# Patient Record
Sex: Male | Born: 1945 | Smoking: Current every day smoker
Health system: Southern US, Academic
[De-identification: ages and names within clinical notes are randomized; demographics above are authoritative.]

## PROBLEM LIST (undated history)

## (undated) DIAGNOSIS — I2510 Atherosclerotic heart disease of native coronary artery without angina pectoris: Secondary | ICD-10-CM

## (undated) DIAGNOSIS — M62838 Other muscle spasm: Secondary | ICD-10-CM

## (undated) DIAGNOSIS — F419 Anxiety disorder, unspecified: Secondary | ICD-10-CM

## (undated) DIAGNOSIS — I714 Abdominal aortic aneurysm, without rupture, unspecified: Secondary | ICD-10-CM

## (undated) DIAGNOSIS — F32A Depression, unspecified: Secondary | ICD-10-CM

## (undated) DIAGNOSIS — E785 Hyperlipidemia, unspecified: Secondary | ICD-10-CM

## (undated) DIAGNOSIS — I10 Essential (primary) hypertension: Secondary | ICD-10-CM

## (undated) DIAGNOSIS — J449 Chronic obstructive pulmonary disease, unspecified: Secondary | ICD-10-CM

## (undated) DIAGNOSIS — F329 Major depressive disorder, single episode, unspecified: Secondary | ICD-10-CM

## (undated) DIAGNOSIS — I4891 Unspecified atrial fibrillation: Secondary | ICD-10-CM

## (undated) DIAGNOSIS — I519 Heart disease, unspecified: Secondary | ICD-10-CM

## (undated) DIAGNOSIS — J9600 Acute respiratory failure, unspecified whether with hypoxia or hypercapnia: Secondary | ICD-10-CM

## (undated) DIAGNOSIS — I255 Ischemic cardiomyopathy: Secondary | ICD-10-CM

## (undated) DIAGNOSIS — I219 Acute myocardial infarction, unspecified: Secondary | ICD-10-CM

## (undated) DIAGNOSIS — I7773 Dissection of renal artery: Secondary | ICD-10-CM

## (undated) DIAGNOSIS — R51 Headache: Secondary | ICD-10-CM

## (undated) DIAGNOSIS — R519 Headache, unspecified: Secondary | ICD-10-CM

## (undated) DIAGNOSIS — F17200 Nicotine dependence, unspecified, uncomplicated: Secondary | ICD-10-CM

## (undated) DIAGNOSIS — IMO0001 Reserved for inherently not codable concepts without codable children: Secondary | ICD-10-CM

## (undated) DIAGNOSIS — I639 Cerebral infarction, unspecified: Secondary | ICD-10-CM

## (undated) DIAGNOSIS — I509 Heart failure, unspecified: Secondary | ICD-10-CM

## (undated) DIAGNOSIS — I739 Peripheral vascular disease, unspecified: Secondary | ICD-10-CM

## (undated) HISTORY — PX: HX CERVICAL SPINE SURGERY: 2100001197

## (undated) HISTORY — PX: HX HERNIA REPAIR: SHX51

## (undated) HISTORY — PX: OTHER SURGICAL HISTORY: SHX169

## (undated) HISTORY — PX: NECK SURGERY: SHX720

## (undated) HISTORY — DX: Peripheral vascular disease, unspecified: I73.9

## (undated) HISTORY — DX: Heart disease, unspecified: I51.9

## (undated) HISTORY — DX: Atherosclerotic heart disease of native coronary artery without angina pectoris: I25.10

## (undated) HISTORY — DX: Cerebral infarction, unspecified: I63.9

## (undated) HISTORY — PX: HAND SURGERY: SHX662

## (undated) HISTORY — DX: Acute myocardial infarction, unspecified: I21.9

## (undated) HISTORY — PX: BACK SURGERY: SHX140

## (undated) HISTORY — DX: Nicotine dependence, unspecified, uncomplicated: F17.200

## (undated) HISTORY — DX: Abdominal aortic aneurysm, without rupture, unspecified: I71.40

## (undated) HISTORY — PX: HERNIA REPAIR: SHX51

## (undated) HISTORY — DX: Unspecified atrial fibrillation: I48.91

## (undated) HISTORY — DX: Chronic obstructive pulmonary disease, unspecified: J44.9

## (undated) HISTORY — DX: Hyperlipidemia, unspecified: E78.5

## (undated) HISTORY — DX: Abdominal aortic aneurysm, without rupture: I71.4

## (undated) HISTORY — PX: WRIST SURGERY: SHX841

---
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
## 2000-11-04 ENCOUNTER — Inpatient Hospital Stay (HOSPITAL_COMMUNITY): Admission: EM | Admit: 2000-11-04 | Discharge: 2000-11-07 | Payer: Self-pay | Admitting: Emergency Medicine

## 2000-11-23 ENCOUNTER — Ambulatory Visit (HOSPITAL_COMMUNITY): Admission: RE | Admit: 2000-11-23 | Discharge: 2000-11-23 | Payer: Self-pay | Admitting: Cardiology

## 2000-11-26 ENCOUNTER — Inpatient Hospital Stay (HOSPITAL_COMMUNITY): Admission: EM | Admit: 2000-11-26 | Discharge: 2000-12-01 | Payer: Self-pay | Admitting: Emergency Medicine

## 2000-11-26 ENCOUNTER — Encounter: Payer: Self-pay | Admitting: Internal Medicine

## 2002-06-14 ENCOUNTER — Encounter: Payer: Self-pay | Admitting: Neurosurgery

## 2002-06-14 ENCOUNTER — Encounter: Admission: RE | Admit: 2002-06-14 | Discharge: 2002-06-14 | Payer: Self-pay | Admitting: Neurosurgery

## 2002-09-24 ENCOUNTER — Emergency Department (HOSPITAL_COMMUNITY): Admission: EM | Admit: 2002-09-24 | Discharge: 2002-09-24 | Payer: Self-pay | Admitting: Emergency Medicine

## 2002-09-24 ENCOUNTER — Encounter: Payer: Self-pay | Admitting: Emergency Medicine

## 2002-11-08 ENCOUNTER — Encounter: Payer: Self-pay | Admitting: Neurosurgery

## 2002-11-08 ENCOUNTER — Ambulatory Visit (HOSPITAL_COMMUNITY): Admission: RE | Admit: 2002-11-08 | Discharge: 2002-11-08 | Payer: Self-pay | Admitting: Neurosurgery

## 2003-01-02 ENCOUNTER — Encounter: Payer: Self-pay | Admitting: Neurosurgery

## 2003-01-05 ENCOUNTER — Ambulatory Visit (HOSPITAL_COMMUNITY): Admission: RE | Admit: 2003-01-05 | Discharge: 2003-01-06 | Payer: Self-pay | Admitting: Neurosurgery

## 2003-01-05 ENCOUNTER — Encounter: Payer: Self-pay | Admitting: Neurosurgery

## 2004-05-24 ENCOUNTER — Emergency Department (HOSPITAL_COMMUNITY): Admission: EM | Admit: 2004-05-24 | Discharge: 2004-05-24 | Payer: Self-pay | Admitting: Emergency Medicine

## 2004-11-21 ENCOUNTER — Ambulatory Visit: Payer: Self-pay | Admitting: Cardiology

## 2006-11-11 ENCOUNTER — Encounter: Admission: RE | Admit: 2006-11-11 | Discharge: 2006-11-11 | Payer: Self-pay | Admitting: Neurosurgery

## 2008-09-01 ENCOUNTER — Ambulatory Visit: Payer: Self-pay | Admitting: Cardiology

## 2008-09-01 ENCOUNTER — Inpatient Hospital Stay (HOSPITAL_COMMUNITY): Admission: EM | Admit: 2008-09-01 | Discharge: 2008-09-06 | Payer: Self-pay | Admitting: Emergency Medicine

## 2008-09-18 DIAGNOSIS — I2510 Atherosclerotic heart disease of native coronary artery without angina pectoris: Secondary | ICD-10-CM | POA: Insufficient documentation

## 2008-09-18 DIAGNOSIS — E7800 Pure hypercholesterolemia, unspecified: Secondary | ICD-10-CM | POA: Insufficient documentation

## 2008-09-18 DIAGNOSIS — I10 Essential (primary) hypertension: Secondary | ICD-10-CM | POA: Insufficient documentation

## 2008-10-28 ENCOUNTER — Emergency Department (HOSPITAL_BASED_OUTPATIENT_CLINIC_OR_DEPARTMENT_OTHER): Admission: EM | Admit: 2008-10-28 | Discharge: 2008-10-28 | Payer: Self-pay | Admitting: Emergency Medicine

## 2008-12-26 HISTORY — PX: US ECHOCARDIOGRAPHY: HXRAD669

## 2009-10-30 ENCOUNTER — Ambulatory Visit (HOSPITAL_COMMUNITY): Admission: RE | Admit: 2009-10-30 | Discharge: 2009-10-30 | Payer: Self-pay | Admitting: Otolaryngology

## 2009-12-18 ENCOUNTER — Ambulatory Visit (HOSPITAL_COMMUNITY): Admission: RE | Admit: 2009-12-18 | Discharge: 2009-12-18 | Payer: Self-pay | Admitting: Otolaryngology

## 2010-09-10 ENCOUNTER — Ambulatory Visit: Admit: 2010-09-10 | Payer: Self-pay | Admitting: Cardiology

## 2010-09-28 ENCOUNTER — Encounter: Payer: Self-pay | Admitting: Neurosurgery

## 2010-09-29 ENCOUNTER — Encounter: Payer: Self-pay | Admitting: Anesthesiology

## 2010-09-29 ENCOUNTER — Encounter: Payer: Self-pay | Admitting: Neurosurgery

## 2010-09-29 ENCOUNTER — Encounter: Payer: Self-pay | Admitting: Cardiology

## 2010-10-07 ENCOUNTER — Ambulatory Visit: Payer: Self-pay | Admitting: Cardiovascular Disease

## 2010-11-27 LAB — CBC
HCT: 39.7 % (ref 39.0–52.0)
Hemoglobin: 13.6 g/dL (ref 13.0–17.0)
MCHC: 34.7 g/dL (ref 30.0–36.0)
MCHC: 34.2 g/dL (ref 30.0–36.0)
MCV: 95 fL (ref 78.0–100.0)
Platelets: 207 10*3/uL (ref 150–400)
RBC: 3.85 MIL/uL — ABNORMAL LOW (ref 4.22–5.81)
RBC: 4.17 MIL/uL — ABNORMAL LOW (ref 4.22–5.81)
RDW: 13.3 % (ref 11.5–15.5)
RDW: 13.6 % (ref 11.5–15.5)

## 2010-11-27 LAB — BASIC METABOLIC PANEL
BUN: 10 mg/dL (ref 6–23)
Calcium: 9.5 mg/dL (ref 8.4–10.5)
Chloride: 106 mEq/L (ref 96–112)
GFR calc non Af Amer: >60 mL/min (ref >60)
Sodium: 141 mEq/L (ref 135–145)

## 2010-11-27 LAB — PROTIME-INR: Prothrombin Time: 13.9 seconds (ref 11.6–15.2)

## 2011-01-21 ENCOUNTER — Telehealth: Payer: Self-pay | Admitting: Cardiovascular Disease

## 2011-01-21 DIAGNOSIS — E785 Hyperlipidemia, unspecified: Secondary | ICD-10-CM

## 2011-01-21 NOTE — H&P (Signed)
NAMELISSANDRO, DILORENZO              ACCOUNT NO.:  [id number]   MEDICAL RECORD NO.:  [id number]          PATIENT TYPE:  INP   LOCATION:  2911                         FACILITY:  [facility]   PHYSICIAN:  Luis Abed, MD, FACCDATE OF BIRTH:  12/20/1945   DATE OF ADMISSION:  09/01/2008  DATE OF DISCHARGE:                              HISTORY & PHYSICAL   PRIMARY CARE PHYSICIAN:  At [facility]   PRIMARY CARDIOLOGIST:  [name], MD, [facility] in 2006.   CHIEF COMPLAINT:  Chest pain.   HISTORY OF PRESENT ILLNESS:  Mr. Canela is a 65 year old male with  previous history of coronary artery disease.  He had chest pain that  woke him at approximately 2:00 a.m.  It resolved spontaneously.  He was  sitting still at approximately noon today and had recurrent symptoms.  It reached to 10/10.  It was associated with shortness of breath,  diaphoresis, and nausea.  It has been continuous since noon.  He has had  IV nitroglycerin, aspirin, and 4 mg of morphine.  His chest pain is  currently a 4/10.  He is currently not short of breath or diaphoretic.  He had 4 mg of Zofran for nausea which has also resolved.   PAST MEDICAL HISTORY:  1. Status post PTCA and stent to the proximal and distal RCA in      February 2002.  2. PTCA and IVUS to the RCA in March 2002, for incomplete stent      apposition in the distal stent.  3. History of C6 and C7 spondylosis with a herniated disk in C7-T1.  4. Subendocardial MI at the time of his initial stenting in February      2002.  5. Hypertension.  6. Hyperlipidemia.  7. Hypertriglyceridemia.  8. Ongoing tobacco use.  9. Chronic pain issues.   SURGICAL HISTORY:  He is status post neck surgery and cardiac  catheterizations.   ALLERGIES:  The patient states he gets GI upset with ASPIRIN and hives  with MOTRIN.  On old records, there is also an IVP DYE allergy listed.   CURRENT MEDICATIONS:  1. Hydrocodone 10 mg/325 one tablet every 4-5 hours.  2. HCTZ 25 mg a  day.  3. Lexapro 20 mg a day.   SOCIAL HISTORY:  He lives in [place] with his girlfriend.  He states  he is retired and disabled.  He has a greater than 40-pack-year history  of ongoing tobacco use, but denies alcohol or drug abuse.   FAMILY HISTORY:  Mother died in her 71s and his father died at age 66,  but as far as the patient knows, neither of his parents nor any siblings  have coronary artery disease.   REVIEW OF SYSTEMS:  He coughs occasionally and wheezes occasionally.  He  states he has some issues with depression.  He has chronic arthralgias  and pain in his neck and back.  He denies reflux symptoms, hematemesis,  hemoptysis, or melena.  He has had no recent illnesses, fevers, or  chills.  Full 14 point review of systems is otherwise negative.   PHYSICAL EXAMINATION:  VITAL SIGNS:  Temperature is 97.5, blood pressure  158/92, pulse 60, respiratory rate 18, O2 saturation 99% on room air.  GENERAL:  He is a slender white male, in no acute distress.  HEENT:  Normal.  NECK:  There is no lymphadenopathy, thyromegaly, bruit, or JVD noted.  CVA:  His heart is regular in rate and rhythm with an S1, S2, and no  significant murmur, rub, or gallop is noted.  Distal pulses are intact  in all 4 extremities.  LUNGS:  He has a slight wheeze anteriorly, but none is noted posteriorly  in the bases.  No crackles are noted.  SKIN:  No rashes or lesions are noted.  ABDOMEN:  Soft and nontender with active bowel sounds.  EXTREMITIES:  There is no cyanosis, clubbing, or edema noted.  MUSCULOSKELETAL:  There is no joint deformity or effusions, and no spine  or CVA tenderness.  NEUROLOGICAL:  He is alert and oriented.  Cranial nerves II through XII  grossly intact.   Chest x-ray shows hyperinflation and questionable COPD, but no acute  disease.  EKG initially sinus rhythm rate 61 with ST depression in V2  through V4, but no ST elevation is noted.  A repeat EKG after his pain  was treated  shows improvement in the ST depression.   LABORATORY VALUES:  Hemoglobin 15.6, hematocrit 46, WBCs 9.19, platelets  248.  Sodium 138, potassium 2.8, chloride 98, BUN 13, creatinine 0.9,  glucose 149, CK-MB point of care 8.9, troponin I point of care 0.27, and  myoglobin 387.   IMPRESSION:  Mr. Gillie was seen today by Dr. Myrtis Ser.  He has a history  of coronary artery disease and his chest pain is very concerning for  acute coronary syndrome.  He does have ST depression that is improving  and a history of allergy to IV CONTRAST.  He will be treated with  gastrointestinal medications and cardiac medications.  Of note, his pain  does get worse with deep inspiration and cough.  If his pain stabilizes,  we will watch him tonight, but if this pain does not stabilize, he will  go to cath.  We will go ahead and start pretreatment with steroids for  an IV DYE allergy.  He will be loaded with Plavix 600 mg p.o. x1 and  then 75 mg daily.  His potassium is being supplemented p.o. and IV, and  he will be anticoagulated with heparin as well as continuing IV  nitroglycerin.  He will be continued on his home pain medications as  well.  Further evaluation and treatment will depend on his response to  the above interventions and the results of labs already ordered.      Theodore Demark, PA-C      Luis Abed, MD, [facility]  Electronically Signed   RB/MEDQ  D:  09/01/2008  T:  09/01/2008  Job:  [id number]

## 2011-01-21 NOTE — Discharge Summary (Signed)
[name], [name]              ACCOUNT NO.:  [id number]   MEDICAL RECORD NO.:  [id number]          PATIENT TYPE:  INP   LOCATION:  2911                         FACILITY:  [facility]   PHYSICIAN:  [name], MD, FACCDATE OF BIRTH:  October 03, 1945   DATE OF ADMISSION:  09/01/2008  DATE OF DISCHARGE:  09/06/2008                               DISCHARGE SUMMARY   PRIMARY CARDIOLOGIST:  [name], MD, [facility]   PRIMARY CARE PHYSICIAN:  [facility].   PROCEDURES PERFORMED DURING HOSPITALIZATION:  1. Cardiac catheterization completed on September 02, 2008 by Dr. Verdis Prime.  This was an emergent cardiac catheterization secondary to      abnormal EKG with chest pain.  A:  Lateral wall infarction 24-60 hours in duration, infarction  secondary to acute occlusion of the circumflex coronary artery, chronic  total occlusion of the right coronary artery in the mid segment with  right-to-right collaterals.  The patient had moderately severe proximal  and mid LAD disease with an LAD lesion at 60% proximal and segmental mid  region of 60-70% stenosis in the first septal perforator.  Diagonal,  ostial 60-70% stenosis and mid diagonal 70-80% stenosis.  Circumflex  totally occluded.  Right coronary artery totally occluded with no  collaterals noted to fill the distal circumflex.  1. Viability study dated September 05, 2008.  A:  Large basal to mid inferolateral and inferior as well as apical-  lateral perfusion defect.  There is a minimal, probably insignificant  redistribution into the defect area at 4 hours and 24 hours suggesting  that this is primarily infarcted territory.   FINAL DISCHARGE DIAGNOSES:  1. Late presentation lateral wall myocardial infarction.  A:  Status post cardiac catheterization on September 02, 2008.  B:  Status post viability study on September 05, 2008.  1. Status post percutaneous transluminal coronary angioplasty and      stent to the proximal and  distal right coronary artery, February      2002.  2. Percutaneous transluminal coronary angioplasty and intravascular      ultrasound to the right coronary artery, March 2002 for incomplete      stent opposition in the distal stent.  3. Subendocardial myocardial infarction at the time of initial      stenting in 2002.  4. Hypertension.  5. Hyperlipidemia.  6. Hypertriglyceridemia.  7. Ongoing tobacco abuse.  8. Chronic pain issues, narcotic dependent.   HOSPITAL COURSE:  This is a 65 year old Caucasian male with previous  history of coronary artery disease who woke with chest pain at  approximately 2:00 a.m. resolving spontaneously on day of admission.  The patient had recurrence of chest pain described as 10/10 at noon the  following morning with associated shortness of breath and was brought to  [facility] Emergency Room.  He was started on IV nitroglycerin, aspirin,  and morphine and brought to cardiac catheterization lab after seen by  Dr. Willa Rough.  The patient was started on heparin and loaded with  Plavix 600 mg.  The patient was taken to cardiac catheterization the  following day for evaluation of coronary artery disease secondary to  ongoing chest discomfort with results as described above.  The patient's  films were thoroughly reviewed by Dr. [name] and make decision  concerning need for coronary artery bypass grafting.  It was suggested  that a viability study be completed prior to being seen by CVTS and this  was completed on September 05, 2008 with results as described above.  The  patient in the interim was placed on digoxin, metoprolol, aspirin, and  Crestor.  The patient was weaned off his heparin post catheterization as  it appeared that the patient had a late presentation for MI.  The  patient had some musculoskeletal pain requiring pain medications for  relief.  His blood pressure and other vital signs remained stable.  There was no evidence of bleeding  or hematoma.   The patient had extensive counseling concerning smoking cessation and  was followed by Cardiac Rehab and documentation concerning smoking  cessation was completed.  The patient was placed on a nicotine patch  during hospitalization and will be discharged on one as well.   The patient was seen and examined on September 06, 2008 by Dr. [name] prior to discharge.  A discussion was held with the patient  concerning the viability study and it was found that there would be  limited or no benefit to revascularization.  The patient's cardiac  anatomy was reviewed with him by Dr. Antoine Poche and it was also reinforced  that he understood that he is at high risk for future cardiovascular  events including death, particularly if he does not change his lifestyle  to include smoking.  The patient verbalized understanding.  Also prior  to discharge, the patient was educated again by Cardiac Rehab concerning  smoking and post-discharge activities and diet.  The patient will be  discharged home with beta-blocker and aspirin.  We will discontinue his  hydrochlorothiazide and will continue on other medications as listed  below.  He will follow up with Dr. Antoine Poche in 2 weeks for further  evaluation.  Of note, the patient did have some post-procedure atrial  fibrillation which resolved spontaneously.   DISCHARGE LABORATORY DATA:  Hemoglobin 11.9, hematocrit 35.3, white  blood cells 6.8, and platelets 232.  Sodium 137, potassium 3.9, chloride  100, CO2 30, BUN 11, and creatinine 0.8.  Cardiac enzymes were found to  be positive with documented troponin at 0.19 with an MB of 283.   DISCHARGE MEDICATIONS:  1. Lexapro 20 mg daily.  2. Pepcid 20 mg daily.  3. Aspirin 325 mg daily.  4. Nicotine patch 21 mg transdermally daily.  5. Digoxin 0.25 mg daily.  6. Crestor 10 mg daily.  7. Metoprolol 12.5 mg twice a day.  8. Nitroglycerin 0.4 mg p.r.n. recurrent chest pain.  9. Oxycodone  10/325 mg p.r.n. as needed for pain as taken prior to      admission.   ALLERGIES:  IV CONTRAST, MOTRIN, and NSAIDS.   FOLLOWUP PLANS AND APPOINTMENTS:  1. The patient will follow up with Dr. [name] on September 19, 2008 at 3:00 p.m. for continued cardiac management.  2. The patient will need to have lipids and LFTs within 6 weeks status      post statin implementation.  3. The patient has been given post-cardiac catheterization      instructions with particular emphasis on the right groin site for      evidence of  bleeding, hematoma, and signs of infection.  4. The patient has been counseled extensively on tobacco cessation and      lifestyle changes by Dr. Antoine Poche and cardiac rehab.  5. It has been discussed with the patient, the patient's medications      and prescriptions and he has been advised to take them daily.   TIME SPENT:  With the patient to include physician time, 40 minutes.      Bettey Mare. Lyman Bishop, NP      [name], MD, [facility]  Electronically Signed    KML/MEDQ  D:  09/06/2008  T:  09/06/2008  Job:  [id number]   cc:   [facility]

## 2011-01-21 NOTE — Telephone Encounter (Signed)
Has a question regarding the Lipitor and Crestor he was prescribed.  Which medication does Dr. Elease Hashimoto want him to discontinue?

## 2011-01-21 NOTE — Cardiovascular Report (Signed)
[name], [name]              ACCOUNT NO.:  [id number]   MEDICAL RECORD NO.:  [id number]          PATIENT TYPE:  INP   LOCATION:  2911                         FACILITY:  [facility]   PHYSICIAN:  Lyn Records, M.D.   DATE OF BIRTH:  16-Jun-1946   DATE OF PROCEDURE:  09/02/2008  DATE OF DISCHARGE:                            CARDIAC CATHETERIZATION   INDICATIONS:  Prolonged chest pain starting at 9:30 a.m. on September 01, 2008.  The patient has had continuous, although gradually decreasing  chest pain over the past 24-30 hours.  He was having mild residual  discomfort today and with progressive abnormalities in cardiac markers,  Dr. Ladona Ridgel has asked that he undergo coronary angiography and possible  PCI if appropriate.  The patient has a prior history of right coronary  bare-metal stents 2002 by Dr. Charlies Constable.  He had residual 60-70% mid  circumflex at that time that was treated medically.   PROCEDURE PERFORMED:  1. Left heart catheterization.  2. Selective coronary angiography.  3. Left ventriculography.   DESCRIPTION:  After informed consent, a 6-French sheath was placed via  the right femoral artery using a modified Seldinger technique.  A 6-  [place], A2 multipurpose catheter was then used for hemodynamic  recordings, left ventriculography by hand injection, and selective right  coronary angiography.  A #4 and subsequently a #5, 6-French left Judkins  catheter was used for left coronary angiography.   After reviewing the anatomy, the case was terminated, and the patient  taken back to the Coronary Care Unit.   RESULTS:  1. Hemodynamic data:      a.     Aortic pressure 119/67.      b.     Left ventricular pressure 122/21 mmHg.  2. Left ventriculography:  The left ventricle demonstrates moderate to      moderately severe inferior wall hypokinesis.  The overall ejection      fraction is 40-45%.  No mitral regurgitation is noted.  3. Coronary angiography.      a.     Left  main coronary is widely patent.      b.     Left anterior descending coronary artery:  The left anterior       descending coronary artery contains moderately severe disease with       an eccentric 60% proximal stenosis and a segmental mid region of       60-70% stenosis after the first septal perforator.  A large first       diagonal contains ostial 60-70% stenosis and the mid diagonal       contains 70-80% stenosis.  LAD wraps around the left ventricular       apex.      c.     Circumflex artery:  Circumflex is totally occluded       proximally and is the suspected source of the patient's presenting       myocardial infarction.  No collaterals are noted to fill this       territory.  The patient had the time of the catheterization is  pain free.      d.     Right coronary artery:  The right coronary artery is totally       occluded in the midvessel after the first acute marginal branch.       The acute marginal branch supplies collaterals to the distal PDA.       No collaterals are noted to fill the distal circumflex.   CONCLUSION:  1. Lateral wall infarction, 24-60 hours in duration.  The patient is      currently pain free.  Infarction secondary to acute occlusion of      the circumflex coronary artery on the a.m. of December 25.  2. Chronic total occlusion of the right coronary artery in the mid      segment with right-to-right collaterals.  The patient has      moderately severe proximal and mid LAD disease and diagonal      disease.  3. Left ventricular dysfunction with moderately severe inferior wall      hypokinesis.  Anterior wall function is hypercontractile.  EF is 45-      50%.   RECOMMENDATIONS:  Continue Integrilin.  Consider surgical  revascularization versus very careful medical followup.  The patient was  not a PCI candidate at this time as it is felt the lateral wall  infarction is complete itself in the absence of ongoing symptoms.      Lyn Records,  M.D.  Electronically Signed     HWS/MEDQ  D:  09/02/2008  T:  09/03/2008  Job:  [id number]   cc:   Luis Abed, MD, [facility]  [facility]  [employer]. Ladona Ridgel, MD

## 2011-01-21 NOTE — Telephone Encounter (Signed)
Pharmacist joel/[facility], said pt has med crestor 10mg  which is equivalent of lipitor 20mg  for ldl reduction. Pt to continue with crestor 10 mg stop lipitor 20mg  and have labs re checked in 3 months. Pt called and informed

## 2011-01-24 NOTE — Cardiovascular Report (Signed)
[place]  Patient:    [name], [name]                    MRN: [id number] Proc. Date: 11/05/00 Attending:  Cecil Cranker, M.D. [facility] CC:         Delorse Lek, M.D.  [name], M.D. [facility]  Cath Lab   Cardiac Catheterization  PROCEDURES PERFORMED: 1. Selective coronary angiography. 2. Left ventricular angiography. 3. Left internal mammary artery angiography. 4. Abdominal aortography - Judkins technique.  INDICATIONS:  The patient is a 65 year old, white, married male with multiple risk factors and prolonged chest pain presenting on November 05, 2000.  HEMODYNAMIC DATA: 1. Aortic pressure 140/90. 2. Left ventricular pressure 140/25.  CORONARY ANGIOGRAPHY: 1. Left main coronary artery:  The left main coronary artery is normal. 2. Left anterior descending artery:  The left anterior descending artery    reveals some generalized minor irregularity but no significant stenosis. 3. Circumflex coronary artery:  The circumflex coronary artery revealed    an 80% lesion in the mid AV circumflex after the first obtuse marginal. 4. Right coronary artery:  The right coronary artery was subtotally    occluded proximally with another lesion of 80-90% distally. There    was TIMI-II flow. There was also collateral filling of the posterior    descending from the left coronary system. 5. Left internal mammary artery:  The left internal mammary artery was patent.  LEFT VENTRICULOGRAPHY:  The left ventricle revealed hypokinesis of the inferior basal wall and overall ejection fraction was approximately 50%.  ABDOMINAL AORTOGRAPHY:  The abdominal aortogram revealed normal renal arteries and some minor irregularity.  After the study, the patient developed jaw pain and nausea and then chest pain and therefore, emergency angioplasty was to be performed by Dr. Charlies Constable.  SUMMARY: 1. Normal left main coronary artery. 2. Minor irregularity left  anterior descending artery. 3. Eighty percent stenosis of midcircumflex artery. 4. Subtotaled right coronary artery. 5. Inferobasilar hypokinesis of left ventricle. 6. Minor to moderate irregularity of abdominal aorta. 7. Patent left internal mammary artery.  CONCLUSIONS:  Patient with prolonged chest pain immediately after the procedure to undergo emergent PCI. DD:  11/05/00 TD:  11/05/00 Job: [id number]

## 2011-01-24 NOTE — Discharge Summary (Signed)
[place]  Patient:    [name], [name]                     MRN: [id number] Adm. Date:  [id number] Disc. Date: [id number] Attending:  Rollene Olson Dictator:   [name], P.A. CC:         Delorse Lek, M.D.                           Discharge Summary  DATE OF BIRTH:  1946-09-06  REASON FOR ADMISSION:  Unstable angina.  DISCHARGE DIAGNOSES: 1. Coronary artery disease. 2. History of percutaneous transluminal coronary angioplasty and stent to the    right coronary artery x 2 (proximally and distally) in February 2002. 3. IV CONTRAST ALLERGY. 4. Hypertension. 5. Hypertriglyceridemia. 6. Tobacco abuse.  PROCEDURES PERFORMED THIS ADMISSION:  Cardiac catheterization and PCI by Dr. Charlies Constable on November 30, 2000 revealing LAD 30% lesions proximally, circumflex with 70% lesion mid vessel, RCA with 40% and 30% lesions mid vessel and a flap at the distal stent site.  LV normal with an EF of 55%.  IVUS of RCA revealing proximal edge of distal stent unapposed status post PTCA of distal stent for lack of complete apposition.  HOSPITAL COURSE:  This 65 year old male had been admitted on November 04, 2000 for unstable angina and underwent PTCA and stenting of the RCA distally and proximally.  He did well during the remainder of his hospitalization.  He was evaluated in the office on November 26, 2000.  He had noted a week of recurrent chest discomfort similar to his previous presenting complaint.  He had an elective Cardiolite scheduled for March 13, was having chest pain at that time.  Admission was recommended to him at that time but he refused.  He continued to have chest discomfort on and off that radiates to his neck and also occurs at rest.  The patient was therefore admitted for further evaluation.  He was placed on heparin and nitroglycerin and his beta blockers, aspirin, and Plavix were continued.  He remained stable throughout the  weekend without any recurrent chest pain.  On Monday, March 25 he went for a cardiac catheterization.  The results are noted above.  He tolerated the procedure well and had no immediate complications.  After Dr. Juanda Chance completed the catheterization, he recognized an area in the distal RCA stent that either represented a tear or was under displayed.  He felt that IVUS should be performed.  This was performed on the same day and showed the proximal edge of the distal stent not apposed.  Therefore, he performed PTCA of the distal stent for lack of complete apposition.  Patient tolerated the procedure well and had no immediate complications.  On the morning of December 01, 2000 he is found to be in stable condition.  Dr. Graciela Husbands saw the patient and felt he was stable enough for discharge to home.  He spoke to Dr. Doristine Counter and Dr. Doristine Counter will handle some medication consolidation issues.  The patient will have a follow-up Cardiolite in three weeks to assess his circumflex lesion and follow-up appointment with Dr. Antoine Olson in four weeks in [place].  LABORATORY DATA:  White blood cell count 11,400; hemoglobin 14.9; hematocrit 43.1; platelet count 262,000.  INR 1.0.  Sodium 138, potassium 3.8, chloride 106, CO2 26, glucose 165, BUN 14, creatinine 0.8.  LFTs reveal SGOT 22, SGPT 26, alkaline  phosphatase 69, total bilirubin 0.8.  Calcium 9.0, magnesium 1.9. Cardiac enzymes were negative x 4.  Chest x-ray showed mild cardiomegaly, some aortic elongation, with minimal calcification, mild COPD.  DISCHARGE MEDICATIONS: 1. Plavix 75 mg q.d. 2. Lopressor 25 mg b.i.d. 3. Tricor 160 mg q.d. 4. Niaspan 500 mg q.d. 5. Nitroglycerin 0.4 mg sublingual p.r.n. chest pain. 6. Coated aspirin 325 mg q.d. 7. Zoloft 50 mg q.d. 8. Lotrel 5/20 q.d.  ACTIVITY:  No driving, heavy lifting, exertion, or sex for three days.  DIET:  Low fat/low sodium diet.  WOUND CARE:  The patient should call our office for any  groin swelling, bleeding, or bruising.  FOLLOW-UP:  He is to have an adenosine rest/stress Cardiolite on Tuesday, April 9 at 9:30 a.m.  He has been advised to be n.p.o. after midnight the night before and to arrive 15 minutes early.  Follow-up with Dr. Lindaann Olson physician assistant on Tuesday, December 22, 2000 at 10:30 a.m.  NOTE:  The patient has been kept on his Plavix and assessment should be made at the time of follow-up appointment as to whether or not the patient should remain on Plavix. DD:  12/01/00 TD:  12/01/00 Job: [id number]

## 2011-01-24 NOTE — Discharge Summary (Signed)
[place]. [facility]  Patient:    [name], [name]                     MRN: [id number] Adm. Date:  [id number] Disc. Date: [id number] Attending:  [name] Dictator:   Annett Fabian, P.A. CC:         Delorse Lek, M.D.  [name], M.D. [facility]   Discharge Summary  DISCHARGE DIAGNOSES: 1. Subendocardial myocardial infarction. 2. Coronary artery disease status post percutaneous coronary intervention with    stenting. 3. Hypertension. 4. Hyperlipidemia. 5. Hypertriglyceridemia. 6. Tobacco use.  HOSPITAL COURSE:  Patient presented to the emergency room complaining of some chest pain without nausea, diaphoresis, or shortness of breath.  He was seen and admitted by Dr. [name] with a diagnosis of unstable angina, rule out MI.  Patient was started on Lovenox and beta blockers.  The following day the patient was taken to the cath lab by Dr. Glennon Hamilton. He noticed there was some minor irregularities in the LAD.  In the circumflex there was a 70-80% lesion in the mid area.  In the right coronary artery there was a subtotal occlusion proximally as well as distally.  Later that day patient was taken back to the cath lab by Dr. Charlies Constable.  He noted a spontaneous occlusion in the right coronary artery.  He was able to open up the distal lesion from approximately 90% down to 0% and the proximal lesion from 100% down to 0% with a small ______ tear.  It is also noted that patient had an acute contrast reaction during the procedure.  It should be noted he will need appropriate prophylaxis for any future catheterizations or IV examinations involving contrast.  Dr. Juanda Chance also recommended evaluation of the circumflex lesion with a Cardiolite exam as an outpatient.  He also placed the patient on Integrilin for 20 hours.  On the next two days, patient remained stable and pain-free.  Cardiac enzymes trended down.  On November 08, 2000, the patient was  deemed to be stable for discharge.  It was noted that his white count was 13.6 and so he was placed empirically on azithromycin 500 mg x 1 followed by 250 mg daily for four days.  DISCHARGE MEDICATIONS: 1. Plavix 75 mg q.d. 2. Lopressor 50 mg one-half tablet b.i.d. 3. Tricor 160 mg q.d. 4. Niaspan 500 mg q.d. 5. Nitroglycerin 0.4 mg sublingual p.r.n. 6. Enteric-coated aspirin 325 mg q.d. 7. Zoloft 50 mg q.d. 8. Lotrel 5/20 once q.d 9. Azithromycin 250 mg q.d. for four days.  DISCHARGE INSTRUCTIONS:  Patient was advised to avoid heavy lifting, driving, sexual activity, or heavy exertion for three days.  Patient is to follow low-salt, low-fat, low-cholesterol diet.  Patient was to observe the cath site for pain, bruising, swelling, or any type of drainage and to call the [place] office for these problems.  FOLLOWUP:  Patient was scheduled for a rest stress Cardiolite exam on Wednesday, November 18, 2000 at 9:30 a.m.  Patient is to follow up with Dr. Antoine Poche in the office on Monday, November 26, 2000 at 9 a.m.  IMAGING:  Chest x-ray revealed cardiomegaly with no acute disease.  LABORATORY VALUES:  Sodium 137, potassium 3.9, chloride 105, CO2 29, BUN 8, creatinine 0.8, glucose 119.  White count 13.6, hemoglobin 15.5, hematocrit 44.3, platelets 222.  Peak cardiac enzymes were CK 102, MB of 17.6, and troponin I of 17.3.  Total cholesterol of 238, triglycerides  519, HDL of 29, LDL was not able to be calculated due to the hypertriglyceridemia. DD:  11/07/00 TD:  11/09/00 Job: [id number]

## 2011-01-24 NOTE — Op Note (Signed)
. [facility]  Patient:    [name], [name]                     MRN: [id number] Proc. Date: 11/30/00 Adm. Date:  [id number] Disc. Date: [id number] Attending:  [name] CC:         Cardiac Catheterization Laboratory             [name], M.D. [facility]                           Operative Report  PROCEDURE:  Percutaneous transluminal coronary angioplasty and IVUS procedure.  CARDIOLOGISTEverardo Beals Juanda Chance, M.D.  INDICATIONS:  Mr. Laminack had two stents placed in the right coronary artery a few weeks ago, and returned with recurrent chest pain.  I studied him earlier today and closed the right femoral artery with Perclose, but upon reviewing t he films, I recognized that there was some streaming of the distal stent in the right coronary artery suggesting either an edge tear or malapposition.  We made a decision to bring him back to the laboratory.  DESCRIPTION OF PROCEDURE:  The procedure was performed via the left femoral artery and arterial sheath and a 7-French JR4 guiding with side holes.  The patient was given weight-adjusted heparin to target the ACT greater than 300 seconds.  We went in with an Atlantis ultrasound catheter and placed this distal to the distal stent, and did an automatic pullback.  After reviewing the films, we felt that there was malapposition of the stent in its proximal edge, so we went in with a 4.0 mm x 9.0 mm Quantum Ranger and performed two inflations up to 16 atmospheres for 30 seconds.  There still was some streaming on the angiogram, so we decided to re-IVUS it.  The stent was further expanded to dimensions of 4.0 mm.  The lumen just proximal to the dissection was somewhat eccentric and there was a very small area where it may have been not fully apposed.  We elected not to dilate this further, in view of the very large lumen and the very small area that was not apposed.  The ACT at the end of the procedure  returned at 230, rather than the greater than 300, and we gave additional heparin at the end of the procedure.  The patient tolerated the procedure well and left the laboratory in satisfactory condition.  RESULTS: 1. Incomplete apposition of the distal stent which had been placed on    November 04, 2000, in its proximal edge. 2. Successful balloon angioplasty for incomplete stent apposition, with    improvement in the stent apposition.  DISPOSITION:  The patient was returned to the post-angioplasty unit for further observation.  Will plan to discharge the patient tomorrow, and still plan an outpatient Cardiolyte scan to evaluate the circumflex stenosis.  I am not sure if his recent chest pain was related to the stent malapposition. DD:  11/30/00 TD:  12/01/00 Job: [id number]

## 2011-01-24 NOTE — Op Note (Signed)
NAME:  [name], [name]                        ACCOUNT NO.:  [id number]   MEDICAL RECORD NO.:  [id number]                   PATIENT TYPE:  OIB   LOCATION:  3011                                 FACILITY:  [facility]   PHYSICIAN:  Reinaldo Meeker, M.D.              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   OPERATION PERFORMED:  C6-7, C7-T1 right foraminotomies followed by excision  of herniated disk at C7-T1.   SECONDARY PROCEDURE:  Microdissection C7and C8 nerve roots on the right.   SURGEON:  Reinaldo Meeker, M.D.   ASSISTANT:  Donalee Citrin, M.D.   ANESTHESIA:  General.   DESCRIPTION OF PROCEDURE:  After being placed in three point pin fixation in  prone position, the patient's neck was shaved, prepped and draped in the  usual sterile fashion.  A localizing x-ray was taken prior to incision to  identify the appropriate level.  A midline incision made above the spinous  processes of C6, C7 and T1.  Using Bovie cutting current, the incision was  carried down to the spinous processes.  Subperiosteal dissection was then  carried out on the right side of spinous processes and lamina and facet  joint.  Self-retaining retractor was placed for exposure.  X-ray showed  approach to the appropriate level.  Starting at C6-7, small laminotomy was  performed laterally.  This was then extended to undermine the knot to  stabilize the C6-7 joint on the right side.  The ligamentum flavum was  removed and proximal nerve root was identified and followed out its foramen  until it was well decompressed.  A similar procedure was then carried out at  C7-T1, once again starting with small lateral laminotomy and then  undermining the facet joint to perform the foraminotomy  and decompress the  C8 nerve root.  The microscope was brought into the field at this time and  used for the remainder of the case.  Started at C7-T1, the C8 nerve root was  identified.  It was mobilized in the inferior superior direction and  herniated disk material beneath it was identified.  A small capsule over the  herniated disk material was incised and the disk material worked free and  then removed in piecemeal fashion until it was well removed and the nerve  root was completely decompressed.  Inspection of C6-7 showed no evidence of  herniated disk.  At this point inspection was carried out in all directions  without any evidence of residual compression and none could be identified.  Large amounts of irrigation were carried out and any bleeding controlled  with bipolar coagulation and Gelfoam.  The wound was then closed using  interrupted Vicryl in  the muscle, fascia, subcutaneous and subcuticular  tissues, staples on the skin.  Sterile dressing was then applied.  The  patient was extubated and taken to the recovery room in stable condition.                                               Reinaldo Meeker, M.D.    ROK/MEDQ  D:  01/05/2003  T:  01/05/2003  Job:  [id number]

## 2011-01-24 NOTE — Discharge Summary (Signed)
[place]. [facility]  Patient:    [name], [name]                     MRN: [id number] Adm. Date:  [id number] Disc. Date: [id number] Attending:  [name] Dictator:   Tereso Newcomer, P.A.                           Discharge Summary  REASON FOR ADMISSION:  Unstable angina.  DISCHARGE DIAGNOSES: 1. Coronary artery disease. 2. History of percutaneous transluminal coronary angioplasty and stent to    the right coronary artery x 2 -- (proximally and distally) February 2002. 3. Intravenous contrast allergy. 4. Hypertension. 5. Hypertriglyceridemia. 6. Tobacco abuse.  PROCEDURES PERFORMED THIS ADMISSION:  Cardiac catheterization and PCI by Dr. Charlies Constable -- November 30, 2000.  Revealing LAD 30% lesions proximally, circumflex with a 70% lesion mid vessel, RCA with 40 and 30% lesions in the mid vessel and a flap at the distal stent site.  LV normal with an EF of 55%. IVUS of RCA, revealing proximal edge of distal stent unapposed -- status post PTCA of distal stent for lack of complete apposition.  HOSPITAL COURSE:  This 65 year old male had been admitted on November 04, 2000 for unstable angina, and underwent PTCA and stenting of the RCA distally and proximally.  He did well during the remainder of his hospitalization.  He was evaluated in the office on November 26, 2000.  He had noted one week of recurrent chest discomfort, similar to his previous presenting complaint.  He had an elective Cardiolite scheduled for November 18, 2000 as he was having chest pain at that time.  Admission was recommended to him at that time, but he refused. He continued to have chest discomfort on and off, radiating to his neck and also occurred at rest.   The patient was therefore admitted for further evaluation.  He was placed on heparin and nitroglycerin, and his beta blockers, aspirin and Plavix were continued.  He remained stable throughout the weekend without any recurrent chest  pain. On Monday, November 30, 2000, he went for cardiac catheterization.  The results are noted above.  He tolerated the procedure well and there were no immediate complications.  After Dr. Juanda Chance completed the catheterization he recognized an area of the distal RCA stent, that either represented a tear or was under displayed.  He felt that IVUS should be performed.  This was performed on the same day and showed the proximal edge of the distal stent not apposed.  Therefore, he performed PTCA of the distal stent for lack of complete apposition.  The patient tolerated the procedure well and had no immediate complications. The morning of December 01, 2000 he was found to be in stable condition. Dr. Graciela Husbands saw the patient and felt he was stable enough for discharge to home. He spoke to Dr. Doristine Counter DD:  12/01/00 TD:  12/01/00 Job: [id number]

## 2011-04-24 ENCOUNTER — Encounter: Payer: Self-pay | Admitting: Cardiovascular Disease

## 2011-05-07 ENCOUNTER — Ambulatory Visit (INDEPENDENT_AMBULATORY_CARE_PROVIDER_SITE_OTHER): Payer: Medicare Other | Admitting: Cardiovascular Disease

## 2011-05-07 ENCOUNTER — Ambulatory Visit (INDEPENDENT_AMBULATORY_CARE_PROVIDER_SITE_OTHER): Payer: Medicare Other | Admitting: *Deleted

## 2011-05-07 ENCOUNTER — Encounter: Payer: Self-pay | Admitting: Cardiovascular Disease

## 2011-05-07 ENCOUNTER — Other Ambulatory Visit (INDEPENDENT_AMBULATORY_CARE_PROVIDER_SITE_OTHER): Payer: Medicare Other | Admitting: *Deleted

## 2011-05-07 DIAGNOSIS — Z125 Encounter for screening for malignant neoplasm of prostate: Secondary | ICD-10-CM

## 2011-05-07 DIAGNOSIS — I2510 Atherosclerotic heart disease of native coronary artery without angina pectoris: Secondary | ICD-10-CM

## 2011-05-07 DIAGNOSIS — R634 Abnormal weight loss: Secondary | ICD-10-CM

## 2011-05-07 DIAGNOSIS — E785 Hyperlipidemia, unspecified: Secondary | ICD-10-CM

## 2011-05-07 DIAGNOSIS — N529 Male erectile dysfunction, unspecified: Secondary | ICD-10-CM

## 2011-05-07 DIAGNOSIS — R319 Hematuria, unspecified: Secondary | ICD-10-CM

## 2011-05-07 LAB — CBC WITH DIFFERENTIAL/PLATELET
Basophils Absolute: 0 10*3/uL (ref 0.0–0.1)
HCT: 39.3 % (ref 39.0–52.0)
Hemoglobin: 13.2 g/dL (ref 13.0–17.0)
Lymphs Abs: 1.6 10*3/uL (ref 0.7–4.0)
MCHC: 33.6 g/dL (ref 30.0–36.0)
Monocytes Relative: 6.2 % (ref 3.0–12.0)
Neutro Abs: 4.4 10*3/uL (ref 1.4–7.7)
RDW: 13.8 % (ref 11.5–14.6)

## 2011-05-07 LAB — BASIC METABOLIC PANEL
BUN: 12 mg/dL (ref 6–23)
CO2: 32 mEq/L (ref 19–32)
Calcium: 9.6 mg/dL (ref 8.4–10.5)
Creatinine, Ser: 0.7 mg/dL (ref 0.4–1.5)
Glucose, Bld: 96 mg/dL (ref 70–99)
Potassium: 5 mEq/L (ref 3.5–5.1)
Sodium: 142 mEq/L (ref 135–145)

## 2011-05-07 LAB — LIPID PANEL
Cholesterol: 105 mg/dL (ref 0–200)
Total CHOL/HDL Ratio: 4
Triglycerides: 142 mg/dL (ref 0.0–149.0)

## 2011-05-07 LAB — HEPATIC FUNCTION PANEL
Albumin: 3.9 g/dL (ref 3.5–5.2)
Alkaline Phosphatase: 72 U/L (ref 39–117)
Total Protein: 6.9 g/dL (ref 6.0–8.3)

## 2011-05-07 NOTE — Progress Notes (Signed)
[name] Date of Birth  1946-02-11 [facility] [address]     [address] [place], [place]  [address] [phone]  Fax  [phone]  History of Present Illness:  65 year old gentleman with a history of coronary disease. He is status post stenting of his left circumflex artery.  He's concerned about his weight loss. He plays lost around 30-35 pounds over the past one year.  He denies any episodes of angina. He denies any syncope or presyncope.  He does not get any regular exercise. He is able to do all of his normal daily activities without any significant problems.  Current Outpatient Prescriptions on File Prior to Visit  Medication Sig Dispense Refill  . aspirin 325 MG tablet Take 81 mg by mouth daily.       [name] HYDROcodone-acetaminophen (VICODIN) 5-500 MG per tablet Take 1 tablet by mouth as needed.        [name] lisinopril (PRINIVIL,ZESTRIL) 5 MG tablet Take 5 mg by mouth daily.        . nitroGLYCERIN (NITROSTAT) 0.4 MG SL tablet Place 0.4 mg under the tongue every 5 (five) minutes as needed.        [name] atorvastatin (LIPITOR) 20 MG tablet Take 20 mg by mouth daily.        . digoxin (LANOXIN) 0.25 MG tablet Take 250 mcg by mouth daily.        [name] escitalopram (LEXAPRO) 20 MG tablet Take 20 mg by mouth daily.        . metoprolol tartrate (LOPRESSOR) 12.5 mg TABS Take by mouth 2 (two) times daily.        . nicotine (NICODERM CQ - DOSED IN MG/24 HOURS) 21 mg/24hr patch Place 1 patch onto the skin as needed.        . rosuvastatin (CRESTOR) 10 MG tablet Take 10 mg by mouth daily.        . simvastatin (ZOCOR) 40 MG tablet Take 40 mg by mouth at bedtime.          Allergies  Allergen Reactions  . Aspirin     Stomach upset  . Ibuprofen     Hives    Past Medical History  Diagnosis Date  . Coronary artery disease     Multivessel  . Tobacco dependence   . Ventricular dysfunction     Mild Left  . Dyslipidemia   . Atrial fibrillation    Brief episode of    Past Surgical History  Procedure Date  . US echocardiography 12-26-2008    EF 40-45%    History  Smoking status  . Former Smoker  Smokeless tobacco  . Not on file    History  Alcohol Use No    No family history on file.  Reviw of Systems:  Reviewed in the HPI.  All other systems are negative.  Physical Exam: BP 120/82  Pulse 58  Ht 5\' 6"  (1.676 m)  Wt 137 lb 12.8 oz (62.506 kg)  BMI 22.24 kg/m2 The patient is alert and oriented x 3.  The mood and affect are normal.   Skin: warm and dry.  Color is normal.    HEENT:   the sclera are nonicteric.  The mucous membranes are moist.  The carotids are 2+ without bruits.  There is no thyromegaly.  There is no JVD.    Lungs: clear.  The chest wall is non tender.    Heart: regular rate with a normal S1  and S2.  There are no murmurs, gallops, or rubs. The PMI is not displaced.     Abdomen: good bowel sounds.  There is no guarding or rebound.  There is no hepatosplenomegaly or tenderness.  There are no masses.   Extremities:  no clubbing, cyanosis, or edema.  The legs are without rashes.  The distal pulses are intact.   Neuro:  Cranial nerves II - XII are intact.  Motor and sensory functions are intact.    The gait is normal.  ECG:  Assessment / Plan:

## 2011-05-07 NOTE — Assessment & Plan Note (Signed)
I'm very concerned about his weight loss. He doesn't have a medical doctor  Encouraged him to get a medical doctor and have given him several names of doctors up in the [place] town. We've drawn a TSH, CBC with differential, and PSA. He already has a hepatic profile and lipid levels. We'll forward these levels to his medical doctor.    Comments that his appetite is not very good. He also notes these had some constipation recently. Likely need to have a GI evaluation at some point.

## 2011-05-07 NOTE — Assessment & Plan Note (Signed)
He's doing well from a cardiac standpoint. He's not had any episodes of angina. 

## 2011-05-07 NOTE — Patient Instructions (Signed)
You will need to get a primary medical doctor.  Dr. Herb Grays Dr. Larita Fife Dr. Candie Echevaria, DO

## 2011-05-08 ENCOUNTER — Encounter: Payer: Self-pay | Admitting: *Deleted

## 2011-05-08 ENCOUNTER — Encounter: Payer: Self-pay | Admitting: Cardiovascular Disease

## 2011-06-12 LAB — BASIC METABOLIC PANEL
BUN: 10 mg/dL (ref 6–23)
CO2: 28 mEq/L (ref 19–32)
Calcium: 8.9 mg/dL (ref 8.4–10.5)
Chloride: 100 mEq/L (ref 96–112)
Chloride: 103 mEq/L (ref 96–112)
Creatinine, Ser: 0.65 mg/dL (ref 0.4–1.5)
Creatinine, Ser: 0.8 mg/dL (ref 0.4–1.5)
GFR calc Af Amer: >60 mL/min (ref >60)
GFR calc Af Amer: >60 mL/min (ref >60)
GFR calc Af Amer: >60 mL/min (ref >60)
GFR calc non Af Amer: >60 mL/min (ref >60)
GFR calc non Af Amer: >60 mL/min (ref >60)
Glucose, Bld: 153 mg/dL — ABNORMAL HIGH (ref 70–99)
Potassium: 3.9 mEq/L (ref 3.5–5.1)
Potassium: 3.9 mEq/L (ref 3.5–5.1)

## 2011-06-12 LAB — CBC
HCT: 35.3 % — ABNORMAL LOW (ref 39.0–52.0)
HCT: 35.8 % — ABNORMAL LOW (ref 39.0–52.0)
HCT: 36.2 % — ABNORMAL LOW (ref 39.0–52.0)
HCT: 44.3 % (ref 39.0–52.0)
Hemoglobin: 11.9 g/dL — ABNORMAL LOW (ref 13.0–17.0)
Hemoglobin: 12.2 g/dL — ABNORMAL LOW (ref 13.0–17.0)
Hemoglobin: 12.9 g/dL — ABNORMAL LOW (ref 13.0–17.0)
MCHC: 33 g/dL (ref 30.0–36.0)
MCHC: 33.1 g/dL (ref 30.0–36.0)
MCHC: 33.6 g/dL (ref 30.0–36.0)
MCHC: 34 g/dL (ref 30.0–36.0)
MCV: 91.6 fL (ref 78.0–100.0)
MCV: 92.3 fL (ref 78.0–100.0)
MCV: 92.6 fL (ref 78.0–100.0)
Platelets: 180 10*3/uL (ref 150–400)
Platelets: 191 10*3/uL (ref 150–400)
Platelets: 214 10*3/uL (ref 150–400)
RBC: 3.82 MIL/uL — ABNORMAL LOW (ref 4.22–5.81)
RBC: 4.15 MIL/uL — ABNORMAL LOW (ref 4.22–5.81)
RBC: 4.8 MIL/uL (ref 4.22–5.81)
RDW: 13.2 % (ref 11.5–15.5)
RDW: 13.4 % (ref 11.5–15.5)
RDW: 13.7 % (ref 11.5–15.5)
RDW: 14.2 % (ref 11.5–15.5)
WBC: 10 10*3/uL (ref 4.0–10.5)
WBC: 8.3 10*3/uL (ref 4.0–10.5)

## 2011-06-12 LAB — POCT I-STAT, CHEM 8
BUN: 13 mg/dL (ref 6–23)
Hemoglobin: 15.6 g/dL (ref 13.0–17.0)
Potassium: 2.8 mEq/L — ABNORMAL LOW (ref 3.5–5.1)
Sodium: 138 mEq/L (ref 135–145)
TCO2: 29 mmol/L (ref 0–100)

## 2011-06-12 LAB — COMPREHENSIVE METABOLIC PANEL
ALT: 36 U/L (ref 0–53)
CO2: 26 mEq/L (ref 19–32)
Calcium: 9.3 mg/dL (ref 8.4–10.5)
GFR calc non Af Amer: >60 mL/min (ref >60)
Glucose, Bld: 250 mg/dL — ABNORMAL HIGH (ref 70–99)
Sodium: 136 mEq/L (ref 135–145)

## 2011-06-12 LAB — DIFFERENTIAL
Basophils Absolute: 0 10*3/uL (ref 0.0–0.1)
Eosinophils Absolute: 0.1 10*3/uL (ref 0.0–0.7)
Eosinophils Relative: 1 % (ref 0–5)
Lymphocytes Relative: 11 % — ABNORMAL LOW (ref 12–46)
Neutrophils Relative %: 86 % — ABNORMAL HIGH (ref 43–77)

## 2011-06-12 LAB — HEMOGLOBIN A1C: Mean Plasma Glucose: 117 mg/dL

## 2011-06-12 LAB — CARDIAC PANEL(CRET KIN+CKTOT+MB+TROPI)
CK, MB: 336.1 ng/mL — ABNORMAL HIGH (ref 0.3–4.0)
Relative Index: 12.2 — ABNORMAL HIGH (ref 0.0–2.5)

## 2011-06-12 LAB — LIPID PANEL
Cholesterol: 215 mg/dL — ABNORMAL HIGH (ref 0–200)
HDL: 26 mg/dL — ABNORMAL LOW (ref >39)
Triglycerides: 250 mg/dL — ABNORMAL HIGH (ref <150)

## 2011-06-12 LAB — HEPARIN LEVEL (UNFRACTIONATED)
Heparin Unfractionated: 0.2 IU/mL — ABNORMAL LOW (ref 0.30–0.70)
Heparin Unfractionated: 0.26 IU/mL — ABNORMAL LOW (ref 0.30–0.70)
Heparin Unfractionated: 0.35 IU/mL (ref 0.30–0.70)
Heparin Unfractionated: <0.1 IU/mL — ABNORMAL LOW (ref 0.30–0.70)

## 2011-06-12 LAB — POCT CARDIAC MARKERS
CKMB, poc: 10.2 ng/mL (ref 1.0–8.0)
Myoglobin, poc: 387 ng/mL (ref 12–200)
Myoglobin, poc: 486 ng/mL (ref 12–200)
Troponin i, poc: 0.24 ng/mL — ABNORMAL HIGH (ref 0.00–0.09)

## 2011-06-12 LAB — TSH: TSH: 0.438 u[IU]/mL (ref 0.350–4.500)

## 2011-10-28 ENCOUNTER — Other Ambulatory Visit: Payer: Self-pay | Admitting: Cardiovascular Disease

## 2012-12-06 ENCOUNTER — Other Ambulatory Visit: Payer: Self-pay | Admitting: *Deleted

## 2012-12-06 MED ORDER — ROSUVASTATIN CALCIUM 10 MG PO TABS: ORAL_TABLET | ORAL | Status: DC

## 2012-12-06 MED ORDER — LISINOPRIL 5 MG PO TABS: ORAL_TABLET | ORAL | Status: DC

## 2012-12-09 ENCOUNTER — Telehealth: Payer: Self-pay | Admitting: *Deleted

## 2012-12-09 NOTE — Telephone Encounter (Signed)
CALLED PT TO MAKE AN APPOINTMENT FOR HIM TO RECEIVE MORE MEDICATION. PT WAS LAST SEEN IN 2012. A LADY PICKED UP AND STATED THIS WAS THE WRONG NUMBER AND FOR [place] TO TAKE THIS NUMBER OFF OF OUR LIST.

## 2012-12-10 ENCOUNTER — Other Ambulatory Visit: Payer: Self-pay | Admitting: *Deleted

## 2012-12-10 MED ORDER — METOPROLOL TARTRATE 25 MG PO TABS: 12.5000 mg | ORAL_TABLET | Freq: Two times a day (BID) | ORAL | Status: DC

## 2012-12-10 MED ORDER — DIGOXIN 250 MCG PO TABS: 0.2500 mg | ORAL_TABLET | Freq: Every day | ORAL | Status: DC

## 2012-12-10 NOTE — Telephone Encounter (Signed)
PT is aware of new address. Number given. Fax Received. Refill Completed. [name] (R.M.A)  No refills until appointment

## 2012-12-16 ENCOUNTER — Other Ambulatory Visit: Payer: Self-pay | Admitting: *Deleted

## 2012-12-16 NOTE — Telephone Encounter (Signed)
Fax Received. Refill Completed. [name] (R.M.A)   

## 2013-03-02 ENCOUNTER — Encounter: Payer: Self-pay | Admitting: Cardiovascular Disease

## 2013-03-02 ENCOUNTER — Ambulatory Visit (INDEPENDENT_AMBULATORY_CARE_PROVIDER_SITE_OTHER): Payer: Medicare Other | Admitting: Cardiovascular Disease

## 2013-03-02 VITALS — BP 111/69 | HR 48 | Ht 66.0 in | Wt 128.0 lb

## 2013-03-02 DIAGNOSIS — R634 Abnormal weight loss: Secondary | ICD-10-CM

## 2013-03-02 DIAGNOSIS — E7800 Pure hypercholesterolemia, unspecified: Secondary | ICD-10-CM

## 2013-03-02 DIAGNOSIS — R1900 Intra-abdominal and pelvic swelling, mass and lump, unspecified site: Secondary | ICD-10-CM | POA: Insufficient documentation

## 2013-03-02 DIAGNOSIS — I2510 Atherosclerotic heart disease of native coronary artery without angina pectoris: Secondary | ICD-10-CM

## 2013-03-02 LAB — BASIC METABOLIC PANEL
BUN: 13 mg/dL (ref 6–23)
CO2: 31 mEq/L (ref 19–32)
Chloride: 102 mEq/L (ref 96–112)
GFR: 100.07 mL/min (ref >60.00)
Glucose, Bld: 95 mg/dL (ref 70–99)
Potassium: 5.8 mEq/L — ABNORMAL HIGH (ref 3.5–5.1)
Sodium: 138 mEq/L (ref 135–145)

## 2013-03-02 LAB — LIPID PANEL
HDL: 27.5 mg/dL — ABNORMAL LOW (ref >39.00)
LDL Cholesterol: 62 mg/dL (ref 0–99)
Total CHOL/HDL Ratio: 4
VLDL: 22.8 mg/dL (ref 0.0–40.0)

## 2013-03-02 LAB — HEPATIC FUNCTION PANEL
Bilirubin, Direct: 0.1 mg/dL (ref 0.0–0.3)
Total Bilirubin: 0.4 mg/dL (ref 0.3–1.2)

## 2013-03-02 NOTE — Patient Instructions (Addendum)
Your physician has requested that you have an abdominal aorta duplex. During this test, an ultrasound is used to evaluate the aorta. Allow 30 minutes for this exam. Do not eat after midnight the day before and avoid carbonated beverages  Your physician recommends that you return for lab work in: today bmet lipid liver  Your physician wants you to follow-up in: 1 year You will receive a reminder letter in the mail two months in advance. If you don't receive a letter, please call our office to schedule the follow-up appointment.  Your physician recommends that you return for a FASTING lipid profile: 1 year

## 2013-03-02 NOTE — Assessment & Plan Note (Signed)
He has a moderate sized pulsatile abdomina mass.  He is thin so this may just represent his aorta.  He has a long hx of smoking and is at risk for having an AAA.

## 2013-03-02 NOTE — Assessment & Plan Note (Addendum)
Pt is stable.  No angina.  I have encouraged him to stop smoking.  We'll check fasting lipid profile, liver enzymes, and basic metabolic profile today. We'll recheck these labs again in one year

## 2013-03-02 NOTE — Assessment & Plan Note (Signed)
He has continued to lost weight.  I have encouraged him to see his medical doctor for this.  His labs were normal last year.

## 2013-03-02 NOTE — Progress Notes (Signed)
[name] Date of Birth  1945-11-08 [facility] / [facility] [address]     [address] [place], [place]  [address] [phone]  Fax  [phone]  History of Present Illness:  67 year old gentleman with a history of coronary disease. He is status post stenting of his left circumflex artery.  He's concerned about his weight loss. He plays lost around 30-35 pounds over the past one year.  He denies any episodes of angina. He denies any syncope or presyncope.  He does not get any regular exercise. He is able to do all of his normal daily activities without any significant problems.  March 02, 2013:  [name] has lost 9 lbs since his visit last years.  He had lost 30 lbs prior to that visit.  He is seeing Dr. Margarita Rana.    He is not having any angina.  He has chronic dyspnea - still smoking.    Current Outpatient Prescriptions on File Prior to Visit  Medication Sig Dispense Refill  . aspirin 325 MG tablet Take 81 mg by mouth daily.       [name] atorvastatin (LIPITOR) 20 MG tablet Take 20 mg by mouth daily.        . digoxin (LANOXIN) 0.25 MG tablet Take 1 tablet (0.25 mg total) by mouth daily.  30 tablet  3  . escitalopram (LEXAPRO) 20 MG tablet Take 20 mg by mouth daily.        [name] HYDROcodone-acetaminophen (VICODIN) 5-500 MG per tablet Take 1 tablet by mouth as needed.        [name] lisinopril (PRINIVIL,ZESTRIL) 5 MG tablet take 1 tablet by mouth once daily  90 tablet  3  . metoprolol tartrate (LOPRESSOR) 25 MG tablet Take 0.5 tablets (12.5 mg total) by mouth 2 (two) times daily.  30 tablet  3  . nitroGLYCERIN (NITROSTAT) 0.4 MG SL tablet Place 0.4 mg under the tongue every 5 (five) minutes as needed.        . rosuvastatin (CRESTOR) 10 MG tablet take 1 tablet by mouth once daily  90 tablet  3  . simvastatin (ZOCOR) 40 MG tablet Take 40 mg by mouth at bedtime.         No current facility-administered medications on file prior to visit.    Allergies  Allergen  Reactions  . Aspirin     Stomach upset  . Ibuprofen     Hives    Past Medical History  Diagnosis Date  . Coronary artery disease     Multivessel, S/p stenting to LCx  . Tobacco dependence   . Ventricular dysfunction     Mild Left  . Dyslipidemia   . Atrial fibrillation      Brief episode of    Past Surgical History  Procedure Laterality Date  . US echocardiography  12-26-2008    EF 40-45%    History  Smoking status  . Current Every Day Smoker  Smokeless tobacco  . Not on file    History  Alcohol Use No    No family history on file.  Reviw of Systems:  Reviewed in the HPI.  All other systems are negative.  Physical Exam: BP 111/69  Pulse 48  Ht 5\' 6"  (1.676 m)  Wt 128 lb (58.06 kg)  BMI 20.67 kg/m2 The patient is alert and oriented x 3.  The mood and affect are normal.   Skin: warm and dry.  Color is normal.    HEENT:  the sclera are nonicteric.  The mucous membranes are moist.  The carotids are 2+ without bruits.  There is no thyromegaly.  There is no JVD.    Lungs: clear.  The chest wall is non tender.    Heart: regular rate with a normal S1 and S2.  There are no murmurs, gallops, or rubs. The PMI is not displaced.     Abdomen: good bowel sounds.  There is no guarding or rebound.  He has a moderate sized pulsitile mass.     Extremities:  no clubbing, cyanosis, or edema.  The legs are without rashes.  The distal pulses are intact.   Neuro:  Cranial nerves II - XII are intact.  Motor and sensory functions are intact.    The gait is normal.  ECG: March 02, 2013:  Sinus brady at 48.  NS ST abnormality. Assessment / Plan:

## 2013-03-07 ENCOUNTER — Encounter (INDEPENDENT_AMBULATORY_CARE_PROVIDER_SITE_OTHER): Payer: Medicare Other

## 2013-03-07 ENCOUNTER — Other Ambulatory Visit: Payer: Self-pay | Admitting: *Deleted

## 2013-03-07 ENCOUNTER — Encounter: Payer: Self-pay | Admitting: *Deleted

## 2013-03-07 DIAGNOSIS — I714 Abdominal aortic aneurysm, without rupture, unspecified: Secondary | ICD-10-CM

## 2013-03-07 DIAGNOSIS — I723 Aneurysm of iliac artery: Secondary | ICD-10-CM

## 2013-03-07 DIAGNOSIS — R1900 Intra-abdominal and pelvic swelling, mass and lump, unspecified site: Secondary | ICD-10-CM

## 2013-03-07 DIAGNOSIS — I739 Peripheral vascular disease, unspecified: Secondary | ICD-10-CM

## 2013-03-07 NOTE — Progress Notes (Signed)
This encounter was created in error - please disregard.

## 2013-03-07 NOTE — Progress Notes (Signed)
Pt ref to vascular surgeon, pt to take it easy no straining.

## 2013-03-17 ENCOUNTER — Encounter: Payer: Self-pay | Admitting: Vascular Surgery

## 2013-03-18 ENCOUNTER — Ambulatory Visit (INDEPENDENT_AMBULATORY_CARE_PROVIDER_SITE_OTHER): Payer: Medicare Other | Admitting: Vascular Surgery

## 2013-03-18 ENCOUNTER — Encounter: Payer: Self-pay | Admitting: Vascular Surgery

## 2013-03-18 VITALS — BP 128/67 | HR 47 | Ht 68.0 in | Wt 133.3 lb

## 2013-03-18 DIAGNOSIS — R1900 Intra-abdominal and pelvic swelling, mass and lump, unspecified site: Secondary | ICD-10-CM | POA: Insufficient documentation

## 2013-03-18 DIAGNOSIS — I714 Abdominal aortic aneurysm, without rupture, unspecified: Secondary | ICD-10-CM | POA: Insufficient documentation

## 2013-03-18 DIAGNOSIS — Z48812 Encounter for surgical aftercare following surgery on the circulatory system: Secondary | ICD-10-CM

## 2013-03-18 NOTE — Progress Notes (Signed)
[facility]   Referred by: Vesta Mixer, MD [address] [place], [place] [address]  Reason for referral: AAA  History of Present Illness  The patient is a 67 y.o. (May 06, 1946) male who presents with chief complaint: "big pulse in belly".  Previous studies demonstrate an AAA, measuring 3.8 cm.  The patient does not have back or abdominal pain.  The patient does not have history of embolic episodes from the AAA.  The patient's risk factors for AAA included: smoking, male sex, and age.  The patient actively smoke cigarettes.  Past Medical History  Diagnosis Date  . Coronary artery disease     Multivessel, S/p stenting to LCx  . Tobacco dependence   . Ventricular dysfunction     Mild Left  . Dyslipidemia   . Atrial fibrillation      Brief episode of  . Myocardial infarction     Past Surgical History  Procedure Laterality Date  . US echocardiography  12-26-2008    EF 40-45%  . Back surgery      cervical  . Stent      pt unsure of location    History   Social History  . Marital Status: Married    Spouse Name: N/A    Number of Children: N/A  . Years of Education: N/A   Occupational History  . Not on file.   Social History Main Topics  . Smoking status: Current Every Day Smoker -- 1.00 packs/day for 50 years    Types: Cigarettes  . Smokeless tobacco: Never Used  . Alcohol Use: No  . Drug Use: No  . Sexually Active: Not on file   Other Topics Concern  . Not on file   Social History Narrative  . No narrative on file    History reviewed. No pertinent family history.  Current Outpatient Prescriptions on File Prior to Visit  Medication Sig Dispense Refill  . aspirin 325 MG tablet Take 81 mg by mouth daily.       [name] atorvastatin (LIPITOR) 20 MG tablet Take 20 mg by mouth daily.        . digoxin (LANOXIN) 0.25 MG tablet Take 1 tablet (0.25 mg total) by mouth daily.  30 tablet  3  . escitalopram (LEXAPRO) 20 MG tablet Take  20 mg by mouth daily.        [name] HYDROcodone-acetaminophen (VICODIN) 5-500 MG per tablet Take 1 tablet by mouth as needed.        [name] lisinopril (PRINIVIL,ZESTRIL) 5 MG tablet take 1 tablet by mouth once daily  90 tablet  3  . metoprolol tartrate (LOPRESSOR) 25 MG tablet Take 0.5 tablets (12.5 mg total) by mouth 2 (two) times daily.  30 tablet  3  . nitroGLYCERIN (NITROSTAT) 0.4 MG SL tablet Place 0.4 mg under the tongue every 5 (five) minutes as needed.        . rosuvastatin (CRESTOR) 10 MG tablet take 1 tablet by mouth once daily  90 tablet  3  . simvastatin (ZOCOR) 40 MG tablet Take 40 mg by mouth at bedtime.         No current facility-administered medications on file prior to visit.    Allergies  Allergen Reactions  . Aspirin     Stomach upset  . Ibuprofen     Hives    REVIEW OF SYSTEMS:  (Positives checked otherwise negative)  CARDIOVASCULAR:  [x]  chest pain, []  chest pressure, [x]  palpitations, [x]  shortness  of breath when laying flat, [x]  shortness of breath with exertion,  [x]  pain in feet when walking, []  pain in feet when laying flat, []  history of blood clot in veins (DVT), []  history of phlebitis, []  swelling in legs, []  varicose veins  PULMONARY:  [x]  productive cough, []  asthma, [x]  wheezing  NEUROLOGIC:  [x]  weakness in arms or legs, [x]  numbness in arms or legs, []  difficulty speaking or slurred speech, []  temporary loss of vision in one eye, [x]  dizziness  HEMATOLOGIC:  []  bleeding problems, []  problems with blood clotting too easily  MUSCULOSKEL:  []  joint pain, []  joint swelling  GASTROINTEST:  []  vomiting blood, []  blood in stool     GENITOURINARY:  []  burning with urination, []  blood in urine  PSYCHIATRIC:  [x]  history of major depression  INTEGUMENTARY:  []  rashes, []  ulcers  CONSTITUTIONAL:  []  fever, []  chills  For VQI Use Only  PRE-ADM LIVING: Home  AMB STATUS: Ambulatory  CAD Sx: None  PRIOR CHF: None  STRESS TEST: [x]  No, [ ]  Normal, [ ]  +  ischemia, [ ]  + MI, [ ]  Both  Physical Examination  Filed Vitals:   03/18/13 1343  BP: 128/67  Pulse: 47  Height: 5\' 8"  (1.727 m)  Weight: 133 lb 4.8 oz (60.464 kg)  SpO2: 97%   Body mass index is 20.27 kg/(m^2).  General: A&O x 3, WDWN, thin  Head: [place]/AT  Ear/Nose/Throat: Hearing grossly intact, nares w/o erythema or drainage, oropharynx w/o Erythema/Exudate  Eyes: PERRLA, EOMI  Neck: Supple, no nuchal rigidity, no palpable LAD  Pulmonary: Sym exp, good air movt, CTAB, no rales, rhonchi, & wheezing  Cardiac: RRR, Nl S1, S2, no Murmurs, rubs or gallops  Vascular: Vessel Right Left  Radial Palpable Palpable  Brachial  Palpable  Palpable  Carotid  Palpable, without bruit  Palpable, without bruit  Aorta Palpable AAA N/A  Femoral  Palpable  Palpable  Popliteal  Not palpable  Not palpable  PT  Palpable  Palpable  DP  Palpable  Palpable   Gastrointestinal: soft, NTND, -G/R, - HSM, - masses, - CVAT B , palpable AAA  Musculoskeletal: M/S 5/5 throughout , Extremities without ischemic changes   Neurologic: CN 2-12 intact , Pain and light touch intact in extremities , Motor exam as listed above  Psychiatric: Judgment intact, Mood & affect appropriate for pt's clinical situation  Dermatologic: See M/S exam for extremity exam, no rashes otherwise noted  Lymph : No Cervical, Axillary, or Inguinal lymphadenopathy   Non-Invasive Vascular Imaging  OSH AAA Duplex (Date: 03/07/13)  Ao: 3.2 cm x 3.8 cm  R CIA: 3.0 cm x 2.5 cm  L CIA: 2.3 cm x 2.1 cm  Medical Decision Making  The patient is a 67 y.o. male who presents with: small AAA, large R CIA aneurysm, moderate size L CIA aneurysm.   Based on this patient's exam and diagnostic studies, he needs CTA Abd/pelvis.  The threshold for repair is AAA size > 5.5 cm, growth > 1 cm/yr, and symptomatic status.  The threshold for repair of iliac aneurysm > 3.5 cm.  Not infrequently the AAA is repaired as part of an iliac  aneurysm repair with EVAR so if either iliac aneurysms or aorta meet criteria, repair of all three will occur.  The patient will follow up in 2 weeks with the CTA.  I emphasized the importance of maximal medical management including strict control of blood pressure, blood glucose, and lipid levels, antiplatelet agents, obtaining regular exercise, and cessation of smoking.    Thank you for allowing [place] to participate in this patient's care.  Leonides Sake, MD [facility] [place] Office: [phone] Pager: [phone]  03/18/2013, 6:40 PM

## 2013-03-28 ENCOUNTER — Encounter: Payer: Self-pay | Admitting: *Deleted

## 2013-04-07 ENCOUNTER — Encounter: Payer: Self-pay | Admitting: Vascular Surgery

## 2013-04-08 ENCOUNTER — Ambulatory Visit
Admission: RE | Admit: 2013-04-08 | Discharge: 2013-04-08 | Disposition: A | Discharge status: Home / Self Care | Payer: Medicare Other | Source: Ambulatory Visit | Attending: Vascular Surgery | Admitting: Vascular Surgery

## 2013-04-08 ENCOUNTER — Encounter: Payer: Self-pay | Admitting: Vascular Surgery

## 2013-04-08 ENCOUNTER — Ambulatory Visit (INDEPENDENT_AMBULATORY_CARE_PROVIDER_SITE_OTHER): Payer: Medicare Other | Admitting: Vascular Surgery

## 2013-04-08 VITALS — BP 142/85 | HR 60 | Resp 18 | Ht 66.0 in | Wt 132.0 lb

## 2013-04-08 DIAGNOSIS — I723 Aneurysm of iliac artery: Secondary | ICD-10-CM

## 2013-04-08 DIAGNOSIS — Z48812 Encounter for surgical aftercare following surgery on the circulatory system: Secondary | ICD-10-CM

## 2013-04-08 DIAGNOSIS — I714 Abdominal aortic aneurysm, without rupture, unspecified: Secondary | ICD-10-CM

## 2013-04-08 MED ORDER — IOHEXOL 350 MG/ML SOLN
70.0000 mL | Freq: Once | INTRAVENOUS | Status: AC | PRN
  Administered 2013-04-08: 70 mL via INTRAVENOUS

## 2013-04-08 NOTE — Progress Notes (Signed)
[facility] [place]  Established Abdominal Aortic Aneurysm  History of Present Illness  The patient is a 67 y.o. (Jun 29, 1946) male who presents with chief complaint: follow up for AAA.  The pt returns from his CTA of abd/pelvis to evaluate his AAA and B iliac aneurysms.  The patient does no have back or abdominal pain.  The patient is an active smoker.  The patient's PMH, PSH, SH, FamHx, Med, and Allergies are unchanged from 03/18/13.  On ROS today: no back or abdominal, continued smoking  Physical Examination  Filed Vitals:   04/08/13 1346  BP: 142/85  Pulse: 60  Resp: 18  Height: 5\' 6"  (1.676 m)  Weight: 132 lb (59.875 kg)   Body mass index is 21.32 kg/(m^2).  General: A&O x 3, WDWN, thin   Pulmonary: Sym exp, good air movt, CTAB, no rales, rhonchi, & wheezing   Cardiac: RRR, Nl S1, S2, no Murmurs, rubs or gallops   Vascular:  Vessel  Right  Left   Radial  Palpable  Palpable   Brachial  Palpable  Palpable   Carotid  Palpable, without bruit  Palpable, without bruit   Aorta  Palpable AAA  N/A   Femoral  Palpable  Palpable   Popliteal  Not palpable  Not palpable   PT  Palpable  Palpable   DP  Palpable  Palpable    Gastrointestinal: soft, NTND, -G/R, - HSM, - masses, - CVAT B , palpable AAA   Musculoskeletal: M/S 5/5 throughout , Extremities without ischemic changes   Neurologic: Pain and light touch intact in extremities , Motor exam as listed above   CTA Abd/pelv(04/08/2013) IMPRESSION:  1. Fusiform aneurysmal dilatation of the infrarenal abdominal aorta measuring approximately 39 mm in diameter and containing a moderate amount of irregular crescentic mural thrombus. The aneurysm is  noted to extend to asymmetrically involve the right common iliac artery which measures approximately 30 mm in diameter. No evidence of abdominal aortic dissection or periaortic stranding.  2. 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  3. 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 This finding is without associated hemodynamically significant stenosis or evidence of prior right-sided renal infarction.  4. Cholelithiasis without evidence of cholecystitis.  5. Mild enlargement of the common bile duct, measuring approximately 9 mm in diameter, the etiology of which is not detected on this examination. Further evaluation with non emergent MRCP may be performed as clinically indicated.  6. Indeterminate approximately 5 mm nodule within the imaged left lower lobe. If the patient is at high risk for bronchogenic carcinoma, follow-up chest CT at 6-12 months is recommended. If the patient is at low risk for bronchogenic carcinoma, follow-up chest CT at 12 months is recommended. This recommendation follows  the consensus statement: Guidelines for Management of Small Pulmonary Nodules Detected on CT Scans: A Statement from the Fleischner Society as published in Radiology 2005; 237:395-400.  Based on my review of the CTA, the pt has a small AAA, with small B CIA aneurysm.  The R CIA is not measured correctly, and is only 2.5 cm in diameter.  The recorded measurement is axial.  Medical Decision Making  The patient is a 67 y.o. male who presents with: Asymptomatic small AAA and B small iliac aneurysms   Based on this patient's exam  and diagnostic studies, he needs q6 month surveillance for the R CIA aneurysm.  The threshold for repair is AAA size > 5.5 cm, iliac artery aneurysm > 3.5 cm, growth > 1 cm/yr, and symptomatic status.  I emphasized the importance of maximal medical management including strict control of blood pressure, blood glucose, and lipid levels, antiplatelet agents, obtaining regular exercise, and cessation of smoking.    Thank you for  allowing [place] to participate in this patient's care.  Leonides Sake, MD [facility] [place] Office: [phone] Pager: [phone]  04/08/2013, 2:15 PM

## 2013-04-15 ENCOUNTER — Other Ambulatory Visit: Payer: Self-pay | Admitting: *Deleted

## 2013-04-15 DIAGNOSIS — I739 Peripheral vascular disease, unspecified: Secondary | ICD-10-CM

## 2013-04-15 DIAGNOSIS — Z48812 Encounter for surgical aftercare following surgery on the circulatory system: Secondary | ICD-10-CM

## 2013-08-05 ENCOUNTER — Other Ambulatory Visit: Payer: Self-pay

## 2013-08-05 MED ORDER — METOPROLOL TARTRATE 25 MG PO TABS: 12.5000 mg | ORAL_TABLET | Freq: Two times a day (BID) | ORAL | Status: DC

## 2013-08-10 ENCOUNTER — Other Ambulatory Visit: Payer: Self-pay | Admitting: Vascular Surgery

## 2013-08-10 DIAGNOSIS — I723 Aneurysm of iliac artery: Secondary | ICD-10-CM

## 2013-09-12 ENCOUNTER — Encounter: Payer: Self-pay | Admitting: Cardiovascular Disease

## 2013-09-12 ENCOUNTER — Ambulatory Visit (HOSPITAL_COMMUNITY): Payer: Medicare Other | Attending: Cardiovascular Disease

## 2013-09-12 DIAGNOSIS — I714 Abdominal aortic aneurysm, without rupture, unspecified: Secondary | ICD-10-CM | POA: Insufficient documentation

## 2013-09-12 DIAGNOSIS — I10 Essential (primary) hypertension: Secondary | ICD-10-CM | POA: Insufficient documentation

## 2013-09-12 DIAGNOSIS — I2510 Atherosclerotic heart disease of native coronary artery without angina pectoris: Secondary | ICD-10-CM | POA: Insufficient documentation

## 2013-09-12 DIAGNOSIS — I723 Aneurysm of iliac artery: Secondary | ICD-10-CM | POA: Insufficient documentation

## 2013-09-12 DIAGNOSIS — E785 Hyperlipidemia, unspecified: Secondary | ICD-10-CM | POA: Insufficient documentation

## 2013-09-12 DIAGNOSIS — F17200 Nicotine dependence, unspecified, uncomplicated: Secondary | ICD-10-CM | POA: Insufficient documentation

## 2013-09-12 DIAGNOSIS — I700 Atherosclerosis of aorta: Secondary | ICD-10-CM | POA: Insufficient documentation

## 2013-09-16 ENCOUNTER — Other Ambulatory Visit: Payer: Self-pay

## 2013-09-16 MED ORDER — DIGOXIN 250 MCG PO TABS: 0.2500 mg | ORAL_TABLET | Freq: Every day | ORAL | Status: DC

## 2013-09-22 ENCOUNTER — Telehealth: Payer: Self-pay

## 2013-09-22 NOTE — Telephone Encounter (Signed)
Pharmacy called to let us know that they change manufacture on digoxin from par to myland

## 2013-10-20 ENCOUNTER — Encounter: Payer: Self-pay | Admitting: Vascular Surgery

## 2013-10-20 ENCOUNTER — Encounter: Payer: Self-pay | Admitting: Family

## 2013-10-21 ENCOUNTER — Other Ambulatory Visit (HOSPITAL_COMMUNITY): Payer: Medicare Other

## 2013-10-21 ENCOUNTER — Ambulatory Visit (INDEPENDENT_AMBULATORY_CARE_PROVIDER_SITE_OTHER): Payer: Medicare Other | Admitting: Vascular Surgery

## 2013-10-21 ENCOUNTER — Encounter: Payer: Self-pay | Admitting: Vascular Surgery

## 2013-10-21 VITALS — BP 129/65 | HR 56 | Ht 66.0 in | Wt 132.1 lb

## 2013-10-21 DIAGNOSIS — I7773 Dissection of renal artery: Secondary | ICD-10-CM

## 2013-10-21 DIAGNOSIS — I714 Abdominal aortic aneurysm, without rupture, unspecified: Secondary | ICD-10-CM

## 2013-10-21 DIAGNOSIS — I723 Aneurysm of iliac artery: Secondary | ICD-10-CM

## 2013-10-21 NOTE — Progress Notes (Signed)
    Established Abdominal Aortic Aneurysm   History of Present Illness  [name] is a 68 y.o. male  who presents with chief complaint: follow up for AAA and iliac aneurysms. The pt had an outside ultrasound done last month.  The patient does no have back or abdominal pain. The patient is an active smoker.  The patient denies any change in urinary habits or any flank pain  The patient's PMH, PSH, SH, FamHx, Med, and Allergies are unchanged from 04/08/13.   On ROS today: no back or abdominal pain, continued smoking, +non productive cought  Physical Examination  Filed Vitals:   10/21/13 0929  BP: 129/65  Pulse: 56  Height: 5\' 6"  (1.676 m)  Weight: 132 lb 1.6 oz (59.92 kg)  SpO2: 94%   Body mass index is 21.33 kg/(m^2).  General: A&O x 3, WDWN, thin   Pulmonary: Sym exp, good air movt, CTAB, no rales, rhonchi, & wheezing   Cardiac: RRR, Nl S1, S2, no Murmurs, rubs or gallops   Vascular:  Vessel  Right  Left   Radial  Palpable  Palpable   Brachial  Palpable  Palpable   Carotid  Palpable, without bruit  Palpable, without bruit   Aorta  Not Palpable N/A   Femoral  Palpable  Palpable   Popliteal  Not palpable  Not palpable   PT  Palpable  Palpable   DP  Palpable  Palpable    Gastrointestinal: soft, NTND, -G/R, - HSM, - masses, - CVAT B , palpable AAA   Musculoskeletal: M/S 5/5 throughout , Extremities without ischemic changes , No flank TTP  Neurologic: Pain and light touch intact in extremities , Motor exam as listed above   Outside Aortoiliac duplex (09/13/13)  Aorta: 3.5 cm x 3.8 cm  R CIA: 3.1 cm x 3.0 cm  L CIA: 2.1 cm x 2.3 cm  Medical Decision Making  [name] is a 68 y.o. 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  No immediate  intervention is needed due to small size. Based on this patient's exam and diagnostic studies, he needs q6 month surveillance for the AAA, B CIA aneurysm, and R renal artery PSA.  The threshold for repair is AAA size > 5.5 cm, iliac artery aneurysm > 3.5 cm, growth > 1 cm/yr, and symptomatic status.  I emphasized the importance of maximal medical management including strict control of blood pressure, blood glucose, and lipid levels, antiplatelet agents, obtaining regular exercise, and cessation of smoking.  I reinforced the importance of smoking cessation to this patient. Thank you for allowing [place] to participate in this patient's care.   Adele Barthel, MD [facility] [place] Office: [phone] Pager: [phone]  10/21/2013, 9:50 AM

## 2013-10-24 NOTE — Addendum Note (Signed)
Addended by: Dorthula Rue L on: 10/24/2013 04:22 PM   Modules accepted: Orders

## 2014-01-26 ENCOUNTER — Other Ambulatory Visit: Payer: Self-pay

## 2014-01-26 MED ORDER — ROSUVASTATIN CALCIUM 10 MG PO TABS: ORAL_TABLET | ORAL | Status: DC

## 2014-02-16 ENCOUNTER — Ambulatory Visit: Payer: Medicare Other | Admitting: Cardiovascular Disease

## 2014-03-24 ENCOUNTER — Encounter: Payer: Self-pay | Admitting: Cardiovascular Disease

## 2014-03-24 ENCOUNTER — Ambulatory Visit (INDEPENDENT_AMBULATORY_CARE_PROVIDER_SITE_OTHER): Payer: Medicare Other | Admitting: Cardiovascular Disease

## 2014-03-24 VITALS — BP 118/65 | HR 51 | Ht 66.0 in | Wt 129.4 lb

## 2014-03-24 DIAGNOSIS — E7800 Pure hypercholesterolemia, unspecified: Secondary | ICD-10-CM

## 2014-03-24 DIAGNOSIS — I2510 Atherosclerotic heart disease of native coronary artery without angina pectoris: Secondary | ICD-10-CM

## 2014-03-24 DIAGNOSIS — I10 Essential (primary) hypertension: Secondary | ICD-10-CM

## 2014-03-24 LAB — BASIC METABOLIC PANEL
BUN: 11 mg/dL (ref 6–23)
CALCIUM: 9.2 mg/dL (ref 8.4–10.5)
CO2: 32 mEq/L (ref 19–32)
CREATININE: 0.7 mg/dL (ref 0.4–1.5)
Chloride: 103 mEq/L (ref 96–112)
GFR: 115.89 mL/min (ref >60.00)
Glucose, Bld: 89 mg/dL (ref 70–99)
Potassium: 4 mEq/L (ref 3.5–5.1)
Sodium: 139 mEq/L (ref 135–145)

## 2014-03-24 LAB — LIPID PANEL
CHOL/HDL RATIO: 3
Cholesterol: 93 mg/dL (ref 0–200)
HDL: 30.8 mg/dL — AB (ref >39.00)
LDL CALC: 48 mg/dL (ref 0–99)
NonHDL: 62.2
TRIGLYCERIDES: 72 mg/dL (ref 0.0–149.0)
VLDL: 14.4 mg/dL (ref 0.0–40.0)

## 2014-03-24 LAB — HEPATIC FUNCTION PANEL
ALT: 6 U/L (ref 0–53)
AST: 14 U/L (ref 0–37)
Albumin: 3.5 g/dL (ref 3.5–5.2)
Alkaline Phosphatase: 53 U/L (ref 39–117)
BILIRUBIN DIRECT: 0.1 mg/dL (ref 0.0–0.3)
BILIRUBIN TOTAL: 0.5 mg/dL (ref 0.2–1.2)
TOTAL PROTEIN: 6.5 g/dL (ref 6.0–8.3)

## 2014-03-24 NOTE — Progress Notes (Signed)
Dahl Higinbotham Sutley Date of Birth  [birth year] [facility] [address] [place], [place]  [address] [phone]  Fax  [phone]  History of Present Illness:  68 year old gentleman with a history of coronary disease. He is status post stenting of his left circumflex artery.  He's concerned about his weight loss. He plays lost around 30-35 pounds over the past one year.  He denies any episodes of angina. He denies any syncope or presyncope.  He does not get any regular exercise. He is able to do all of his normal daily activities without any significant problems.  March 02, 2013:  Jaedin has lost 9 lbs since his visit last years.  He had lost 30 lbs prior to that visit.  He is seeing Dr. Almedia Balls.    He is not having any angina.  He has chronic dyspnea - still smoking.    March 24, 2014:    Current Outpatient Prescriptions on File Prior to Visit  Medication Sig Dispense Refill  . aspirin 325 MG tablet Take 81 mg by mouth daily.       [name] atorvastatin (LIPITOR) 20 MG tablet Take 20 mg by mouth daily.        . digoxin (LANOXIN) 0.25 MG tablet Take 1 tablet (0.25 mg total) by mouth daily.  30 tablet  9  . escitalopram (LEXAPRO) 20 MG tablet Take 20 mg by mouth daily.        [name] HYDROcodone-acetaminophen (VICODIN) 5-500 MG per tablet Take 1 tablet by mouth as needed.        [name] lisinopril (PRINIVIL,ZESTRIL) 5 MG tablet take 1 tablet by mouth once daily  90 tablet  3  . metoprolol tartrate (LOPRESSOR) 25 MG tablet Take 0.5 tablets (12.5 mg total) by mouth 2 (two) times daily.  30 tablet  9  . nitroGLYCERIN (NITROSTAT) 0.4 MG SL tablet Place 0.4 mg under the tongue every 5 (five) minutes as needed.        . rosuvastatin (CRESTOR) 10 MG tablet take 1 tablet by mouth once daily  90 tablet  0  . simvastatin (ZOCOR) 40 MG tablet Take 40 mg by mouth at bedtime.         No current facility-administered medications on file prior to visit.     Allergies  Allergen Reactions  . Aspirin     Stomach upset  . Ibuprofen     Hives    Past Medical History  Diagnosis Date  . Coronary artery disease     Multivessel, S/p stenting to LCx  . Tobacco dependence   . Ventricular dysfunction     Mild Left  . Dyslipidemia   . Atrial fibrillation      Brief episode of  . Myocardial infarction     Past Surgical History  Procedure Laterality Date  . US echocardiography  12-26-2008    EF 40-45%  . Back surgery      cervical  . Stent      pt unsure of location    History  Smoking status  . Current Every Day Smoker -- 1.00 packs/day for 50 years  . Types: Cigarettes  Smokeless tobacco  . Never Used    History  Alcohol Use No    Family History  Problem Relation Age of Onset  . AAA (abdominal aortic aneurysm) Mother   . Cancer Father     bone marrow    Reviw of Systems:  Reviewed in the HPI.  All other systems are negative.  Physical Exam: BP 151/71  Pulse 51  Ht 5\' 6"  (1.676 m)  Wt 129 lb 6.4 oz (58.695 kg)  BMI 20.90 kg/m2  SpO2 98% The patient is alert and oriented x 3.  The mood and affect are normal.   Skin: warm and dry.  Color is normal.    HEENT:   the sclera are nonicteric.  The mucous membranes are moist.  The carotids are 2+ without bruits.  There is no thyromegaly.  There is no JVD.    Lungs: bilateral wheezing.     Heart: regular rate with a normal S1 and S2.  There are no murmurs, gallops, or rubs. The PMI is not displaced.     Abdomen: good bowel sounds.  There is no guarding or rebound.  He has a moderate sized pulsitile mass.     Extremities:  no clubbing, cyanosis, or edema.  The legs are without rashes.  The distal pulses are intact.   Neuro:  Cranial nerves II - XII are intact.  Motor and sensory functions are intact.    The gait is normal.  ECG: July 17 ,2015:  Sinus brady at 51, NS ST / T abn. Assessment / Plan:

## 2014-03-24 NOTE — Assessment & Plan Note (Signed)
He is not having any smptoms of CAD.  He continues to smoke. I've advised him to quit.  He has an AAA and bilateral iliac aneurisms.    I've advised his to see Dr. Bridgett Larsson.  He thinks  That his right iliac aneurism has grown.

## 2014-03-24 NOTE — Patient Instructions (Addendum)
Call Dr. Brent Bulla and Vascular specialist  [phone]  Your physician has recommended you make the following change in your medication:  STOP Digoxin  Your physician recommends that you have lab work:  TODAY - cholesterol, liver and basic metabolic panel  Your physician wants you to follow-up in: 1 year with Dr. [name].  You will receive a reminder letter in the mail two months in advance. If you don't receive a letter, please call our office to schedule the follow-up appointment.

## 2014-04-20 ENCOUNTER — Encounter: Payer: Self-pay | Admitting: Vascular Surgery

## 2014-04-21 ENCOUNTER — Ambulatory Visit (HOSPITAL_COMMUNITY)
Admission: RE | Admit: 2014-04-21 | Discharge: 2014-04-21 | Disposition: A | Discharge status: Home / Self Care | Payer: Medicare Other | Source: Ambulatory Visit | Attending: Vascular Surgery | Admitting: Vascular Surgery

## 2014-04-21 ENCOUNTER — Other Ambulatory Visit: Payer: Self-pay | Admitting: Vascular Surgery

## 2014-04-21 ENCOUNTER — Ambulatory Visit (INDEPENDENT_AMBULATORY_CARE_PROVIDER_SITE_OTHER): Payer: Medicare Other | Admitting: Vascular Surgery

## 2014-04-21 ENCOUNTER — Encounter: Payer: Self-pay | Admitting: Vascular Surgery

## 2014-04-21 VITALS — BP 136/72 | HR 52 | Ht 66.0 in | Wt 129.2 lb

## 2014-04-21 DIAGNOSIS — I714 Abdominal aortic aneurysm, without rupture, unspecified: Secondary | ICD-10-CM | POA: Diagnosis not present

## 2014-04-21 DIAGNOSIS — I7773 Dissection of renal artery: Secondary | ICD-10-CM | POA: Insufficient documentation

## 2014-04-21 DIAGNOSIS — I723 Aneurysm of iliac artery: Secondary | ICD-10-CM

## 2014-04-21 DIAGNOSIS — I2510 Atherosclerotic heart disease of native coronary artery without angina pectoris: Secondary | ICD-10-CM

## 2014-04-21 DIAGNOSIS — I722 Aneurysm of renal artery: Secondary | ICD-10-CM

## 2014-04-21 NOTE — Addendum Note (Signed)
Addended by: Mena Goes on: 04/21/2014 03:15 PM   Modules accepted: Orders

## 2014-04-21 NOTE — Progress Notes (Signed)
    Established Abdominal Aortic Aneurysm and Iliac Aneurysms  History of Present Illness  The patient is a 68 y.o. (03-Sep-1946) male who presents with chief complaint: follow up for AAA.  Previous studies demonstrate an AAA, measuring 3.8 cm.  He also has R CIA aneurysm 3.1 cm.  The patient does have back or abdominal pain.  The patient is an active smoker.  He denies any claudication sx  The patient's PMH, PSH, SH, FamHx, Med, and Allergies are unchanged from 10/21/13.  On ROS today: no embolic sx, no intermittent claudication   Physical Examination  Filed Vitals:   04/21/14 1011  BP: 136/72  Pulse: 52  Height: 5\' 6"  (1.676 m)  Weight: 129 lb 3.2 oz (58.605 kg)  SpO2: 98%   Body mass index is 20.86 kg/(m^2).  General: A&O x 3, WD, thin  Pulmonary: Sym exp, good air movt, CTAB, no rales, rhonchi, & wheezing  Cardiac: RRR, Nl S1, S2, no Murmurs, rubs or gallops  Vascular: Vessel Right Left  Radial Palpable Palpable  Brachial Palpable Palpable  Carotid Palpable, without bruit Palpable, without bruit  Aorta Palpable N/A  Femoral Palpable Palpable  Popliteal Not palpable Not palpable  PT Palpable Palpable  DP Palpable Palpable   Gastrointestinal: soft, NTND, -G/R, - HSM, - masses, - CVAT B, palpable aorta  Musculoskeletal: M/S 5/5 throughout , Extremities without ischemic changes   Neurologic: Pain and light touch intact in extremities , Motor exam as listed above  Non-Invasive Vascular Imaging  AAA Duplex (04/21/2014)  Previous size: 3.5 cm x 3.8 cm (Date: 09/13/13)  Current size:  3.76 cm x 3.76 cm (Date: 04/21/2014 )  R CIA: 2.98 cm x 2.95 cm  L CIA: 1.96 cm x 1.96 cm  R renal duplex (04/21/2014)  No stenosis in R RA  PSA at renal artery bifurcation (0.93 cm)   Medical Decision Making  The patient is a 68 y.o. male who presents with: asymptomatic AAA with stable , stable B CIA aneurysm size, stable size R RA PSA   Based on this patient's exam and  diagnostic studies, he needs q6 month aortoiliac duplex.  The threshold for repair is AAA size > 5.5 cm, growth > 1 cm/yr, and symptomatic status.  Also iliac aneurysm > 3.5 cm would merit intervention  I emphasized the importance of maximal medical management including strict control of blood pressure, blood glucose, and lipid levels, antiplatelet agents, obtaining regular exercise, and cessation of smoking.    I have emphasize the importance of smoking cessation to the patient.  Thank you for allowing [place] to participate in this patient's care.  Adele Barthel, MD [facility] [place] Office: [phone] Pager: [phone]  04/21/2014, 12:31 PM

## 2014-05-17 ENCOUNTER — Other Ambulatory Visit: Payer: Self-pay

## 2014-05-17 MED ORDER — ROSUVASTATIN CALCIUM 10 MG PO TABS: ORAL_TABLET | ORAL | Status: DC

## 2014-06-22 ENCOUNTER — Other Ambulatory Visit: Payer: Self-pay

## 2014-06-22 MED ORDER — METOPROLOL TARTRATE 25 MG PO TABS: 12.5000 mg | ORAL_TABLET | Freq: Two times a day (BID) | ORAL | Status: DC

## 2014-07-25 ENCOUNTER — Other Ambulatory Visit: Payer: Self-pay

## 2014-07-25 MED ORDER — METOPROLOL TARTRATE 25 MG PO TABS: 12.5000 mg | ORAL_TABLET | Freq: Two times a day (BID) | ORAL | Status: DC

## 2014-10-14 ENCOUNTER — Inpatient Hospital Stay (HOSPITAL_COMMUNITY)
Admission: EM | Admit: 2014-10-14 | Discharge: 2014-10-17 | DRG: 309 | Discharge status: Against Medical Advice | Payer: Medicare Other | Attending: Cardiovascular Disease | Admitting: Cardiovascular Disease

## 2014-10-14 ENCOUNTER — Encounter (HOSPITAL_COMMUNITY): Payer: Self-pay | Admitting: Emergency Medicine

## 2014-10-14 ENCOUNTER — Emergency Department (HOSPITAL_COMMUNITY): Payer: Medicare Other

## 2014-10-14 DIAGNOSIS — E785 Hyperlipidemia, unspecified: Secondary | ICD-10-CM | POA: Diagnosis present

## 2014-10-14 DIAGNOSIS — R053 Chronic cough: Secondary | ICD-10-CM | POA: Insufficient documentation

## 2014-10-14 DIAGNOSIS — Z7982 Long term (current) use of aspirin: Secondary | ICD-10-CM

## 2014-10-14 DIAGNOSIS — I252 Old myocardial infarction: Secondary | ICD-10-CM | POA: Diagnosis not present

## 2014-10-14 DIAGNOSIS — R05 Cough: Secondary | ICD-10-CM | POA: Insufficient documentation

## 2014-10-14 DIAGNOSIS — I10 Essential (primary) hypertension: Secondary | ICD-10-CM | POA: Diagnosis present

## 2014-10-14 DIAGNOSIS — Z8673 Personal history of transient ischemic attack (TIA), and cerebral infarction without residual deficits: Secondary | ICD-10-CM

## 2014-10-14 DIAGNOSIS — I5042 Chronic combined systolic (congestive) and diastolic (congestive) heart failure: Secondary | ICD-10-CM | POA: Diagnosis present

## 2014-10-14 DIAGNOSIS — I2582 Chronic total occlusion of coronary artery: Secondary | ICD-10-CM | POA: Diagnosis present

## 2014-10-14 DIAGNOSIS — I471 Supraventricular tachycardia, unspecified: Secondary | ICD-10-CM | POA: Diagnosis present

## 2014-10-14 DIAGNOSIS — I4819 Other persistent atrial fibrillation: Secondary | ICD-10-CM

## 2014-10-14 DIAGNOSIS — I214 Non-ST elevation (NSTEMI) myocardial infarction: Secondary | ICD-10-CM

## 2014-10-14 DIAGNOSIS — F17210 Nicotine dependence, cigarettes, uncomplicated: Secondary | ICD-10-CM | POA: Diagnosis present

## 2014-10-14 DIAGNOSIS — I481 Persistent atrial fibrillation: Secondary | ICD-10-CM | POA: Diagnosis present

## 2014-10-14 DIAGNOSIS — I249 Acute ischemic heart disease, unspecified: Secondary | ICD-10-CM

## 2014-10-14 DIAGNOSIS — Z886 Allergy status to analgesic agent status: Secondary | ICD-10-CM | POA: Diagnosis not present

## 2014-10-14 DIAGNOSIS — I2510 Atherosclerotic heart disease of native coronary artery without angina pectoris: Secondary | ICD-10-CM | POA: Diagnosis present

## 2014-10-14 DIAGNOSIS — R079 Chest pain, unspecified: Secondary | ICD-10-CM | POA: Diagnosis present

## 2014-10-14 DIAGNOSIS — I714 Abdominal aortic aneurysm, without rupture: Secondary | ICD-10-CM | POA: Diagnosis present

## 2014-10-14 DIAGNOSIS — R634 Abnormal weight loss: Secondary | ICD-10-CM | POA: Diagnosis present

## 2014-10-14 DIAGNOSIS — Z955 Presence of coronary angioplasty implant and graft: Secondary | ICD-10-CM

## 2014-10-14 DIAGNOSIS — J449 Chronic obstructive pulmonary disease, unspecified: Secondary | ICD-10-CM

## 2014-10-14 DIAGNOSIS — Z720 Tobacco use: Secondary | ICD-10-CM

## 2014-10-14 DIAGNOSIS — E7800 Pure hypercholesterolemia, unspecified: Secondary | ICD-10-CM | POA: Diagnosis present

## 2014-10-14 DIAGNOSIS — J471 Bronchiectasis with (acute) exacerbation: Secondary | ICD-10-CM | POA: Diagnosis present

## 2014-10-14 DIAGNOSIS — R918 Other nonspecific abnormal finding of lung field: Secondary | ICD-10-CM | POA: Insufficient documentation

## 2014-10-14 DIAGNOSIS — I480 Paroxysmal atrial fibrillation: Secondary | ICD-10-CM

## 2014-10-14 LAB — I-STAT CHEM 8, ED
BUN: 14 mg/dL (ref 6–23)
Calcium, Ion: 1.16 mmol/L (ref 1.13–1.30)
Chloride: 100 mmol/L (ref 96–112)
Creatinine, Ser: 0.7 mg/dL (ref 0.50–1.35)
Glucose, Bld: 130 mg/dL — ABNORMAL HIGH (ref 70–99)
HCT: 40 % (ref 39.0–52.0)
Hemoglobin: 13.6 g/dL (ref 13.0–17.0)
Potassium: 4.1 mmol/L (ref 3.5–5.1)
Sodium: 141 mmol/L (ref 135–145)
TCO2: 24 mmol/L (ref 0–100)

## 2014-10-14 LAB — CBC WITH DIFFERENTIAL/PLATELET
Basophils Absolute: 0.1 K/uL (ref 0.0–0.1)
Basophils Relative: 1 % (ref 0–1)
Eosinophils Absolute: 0 K/uL (ref 0.0–0.7)
Eosinophils Relative: 0 % (ref 0–5)
HCT: 35.2 % — ABNORMAL LOW (ref 39.0–52.0)
Hemoglobin: 11.5 g/dL — ABNORMAL LOW (ref 13.0–17.0)
Lymphocytes Relative: 8 % — ABNORMAL LOW (ref 12–46)
Lymphs Abs: 0.7 K/uL (ref 0.7–4.0)
MCH: 29.4 pg (ref 26.0–34.0)
MCHC: 32.7 g/dL (ref 30.0–36.0)
MCV: 90 fL (ref 78.0–100.0)
Monocytes Absolute: 0.4 K/uL (ref 0.1–1.0)
Monocytes Relative: 4 % (ref 3–12)
Neutro Abs: 7.6 K/uL (ref 1.7–7.7)
Neutrophils Relative %: 87 % — ABNORMAL HIGH (ref 43–77)
Platelets: 190 K/uL (ref 150–400)
RBC: 3.91 MIL/uL — ABNORMAL LOW (ref 4.22–5.81)
RDW: 13.2 % (ref 11.5–15.5)
WBC: 8.7 K/uL (ref 4.0–10.5)

## 2014-10-14 LAB — COMPREHENSIVE METABOLIC PANEL
ALBUMIN: 3.2 g/dL — AB (ref 3.5–5.2)
ALK PHOS: 72 U/L (ref 39–117)
ALT: 11 U/L (ref 0–53)
AST: 21 U/L (ref 0–37)
Anion gap: 6 (ref 5–15)
BILIRUBIN TOTAL: 0.6 mg/dL (ref 0.3–1.2)
BUN: 12 mg/dL (ref 6–23)
CHLORIDE: 104 mmol/L (ref 96–112)
CO2: 28 mmol/L (ref 19–32)
CREATININE: 0.73 mg/dL (ref 0.50–1.35)
Calcium: 8.7 mg/dL (ref 8.4–10.5)
GLUCOSE: 135 mg/dL — AB (ref 70–99)
POTASSIUM: 4.1 mmol/L (ref 3.5–5.1)
SODIUM: 138 mmol/L (ref 135–145)
Total Protein: 6.5 g/dL (ref 6.0–8.3)

## 2014-10-14 LAB — I-STAT TROPONIN, ED: Troponin i, poc: 0.13 ng/mL (ref 0.00–0.08)

## 2014-10-14 LAB — BRAIN NATRIURETIC PEPTIDE: B Natriuretic Peptide: 135.5 pg/mL — ABNORMAL HIGH (ref 0.0–100.0)

## 2014-10-14 LAB — I-STAT CG4 LACTIC ACID, ED: Lactic Acid, Venous: 2.26 mmol/L (ref 0.5–2.0)

## 2014-10-14 LAB — LIPASE, BLOOD: Lipase: 21 U/L (ref 11–59)

## 2014-10-14 LAB — PROTIME-INR
INR: 1.15 (ref 0.00–1.49)
Prothrombin Time: 14.9 s (ref 11.6–15.2)

## 2014-10-14 MED ORDER — LISINOPRIL 5 MG PO TABS
5.0000 mg | ORAL_TABLET | Freq: Every day | ORAL | Status: DC
  Administered 2014-10-15 – 2014-10-17 (×3): 5 mg via ORAL
  Filled 2014-10-14 (×3): qty 1

## 2014-10-14 MED ORDER — HYDROCODONE-ACETAMINOPHEN 5-325 MG PO TABS
1.0000 | ORAL_TABLET | Freq: Four times a day (QID) | ORAL | Status: DC | PRN
  Administered 2014-10-15 (×2): 1 via ORAL
  Filled 2014-10-14 (×2): qty 1

## 2014-10-14 MED ORDER — ASPIRIN EC 81 MG PO TBEC
81.0000 mg | DELAYED_RELEASE_TABLET | Freq: Every day | ORAL | Status: DC
  Administered 2014-10-15 – 2014-10-17 (×3): 81 mg via ORAL
  Filled 2014-10-14 (×3): qty 1

## 2014-10-14 MED ORDER — ATORVASTATIN CALCIUM 80 MG PO TABS
80.0000 mg | ORAL_TABLET | Freq: Every day | ORAL | Status: DC
  Administered 2014-10-15 – 2014-10-16 (×2): 80 mg via ORAL
  Filled 2014-10-14 (×3): qty 1

## 2014-10-14 MED ORDER — HEPARIN BOLUS VIA INFUSION
3000.0000 [IU] | Freq: Once | INTRAVENOUS | Status: AC
  Administered 2014-10-14: 3000 [IU] via INTRAVENOUS
  Filled 2014-10-14: qty 3000

## 2014-10-14 MED ORDER — ASPIRIN EC 81 MG PO TBEC: 81.0000 mg | DELAYED_RELEASE_TABLET | Freq: Every day | ORAL | Status: DC

## 2014-10-14 MED ORDER — ESCITALOPRAM OXALATE 20 MG PO TABS
20.0000 mg | ORAL_TABLET | Freq: Every day | ORAL | Status: DC
  Administered 2014-10-15 – 2014-10-17 (×3): 20 mg via ORAL
  Filled 2014-10-14 (×3): qty 1

## 2014-10-14 MED ORDER — SODIUM CHLORIDE 0.9 % IV BOLUS (SEPSIS)
250.0000 mL | Freq: Once | INTRAVENOUS | Status: AC
  Administered 2014-10-14: 250 mL via INTRAVENOUS

## 2014-10-14 MED ORDER — ASPIRIN 81 MG PO CHEW: 324.0000 mg | CHEWABLE_TABLET | ORAL | Status: DC

## 2014-10-14 MED ORDER — HEPARIN (PORCINE) IN NACL 100-0.45 UNIT/ML-% IJ SOLN
1100.0000 [IU]/h | INTRAMUSCULAR | Status: DC
  Administered 2014-10-14: 900 [IU]/h via INTRAVENOUS
  Filled 2014-10-14 (×2): qty 250

## 2014-10-14 MED ORDER — ACETAMINOPHEN 325 MG PO TABS
650.0000 mg | ORAL_TABLET | ORAL | Status: DC | PRN
Start: 2014-10-14 — End: 2014-10-17

## 2014-10-14 MED ORDER — HYDROCODONE-ACETAMINOPHEN 5-325 MG PO TABS
1.0000 | ORAL_TABLET | Freq: Once | ORAL | Status: AC
  Administered 2014-10-14: 1 via ORAL
  Filled 2014-10-14: qty 1

## 2014-10-14 MED ORDER — METOPROLOL TARTRATE 25 MG PO TABS
25.0000 mg | ORAL_TABLET | Freq: Three times a day (TID) | ORAL | Status: DC
  Administered 2014-10-15 – 2014-10-17 (×8): 25 mg via ORAL
  Filled 2014-10-14 (×11): qty 1

## 2014-10-14 MED ORDER — ASPIRIN 300 MG RE SUPP: 300.0000 mg | RECTAL | Status: DC

## 2014-10-14 MED ORDER — FLUTICASONE PROPIONATE 50 MCG/ACT NA SUSP
2.0000 | Freq: Every day | NASAL | Status: DC
  Administered 2014-10-15 – 2014-10-17 (×3): 2 via NASAL
  Filled 2014-10-14: qty 16

## 2014-10-14 MED ORDER — NICOTINE 21 MG/24HR TD PT24
21.0000 mg | MEDICATED_PATCH | TRANSDERMAL | Status: DC
  Administered 2014-10-15 – 2014-10-16 (×3): 21 mg via TRANSDERMAL
  Filled 2014-10-14 (×4): qty 1

## 2014-10-14 MED ORDER — ONDANSETRON HCL 4 MG/2ML IJ SOLN: 4.0000 mg | Freq: Four times a day (QID) | INTRAMUSCULAR | Status: DC | PRN

## 2014-10-14 MED ORDER — NITROGLYCERIN 0.4 MG SL SUBL: 0.4000 mg | SUBLINGUAL_TABLET | SUBLINGUAL | Status: DC | PRN

## 2014-10-14 MED ORDER — ALBUTEROL SULFATE (2.5 MG/3ML) 0.083% IN NEBU
3.0000 mL | INHALATION_SOLUTION | RESPIRATORY_TRACT | Status: DC | PRN
  Administered 2014-10-15: 3 mL via RESPIRATORY_TRACT
  Filled 2014-10-14: qty 3

## 2014-10-14 MED ORDER — SODIUM CHLORIDE 0.9 % IV SOLN
INTRAVENOUS | Status: DC
  Administered 2014-10-14 – 2014-10-15 (×2): via INTRAVENOUS

## 2014-10-14 NOTE — ED Notes (Signed)
Per Dr. Almedia Balls UA does noto have to Urgent.

## 2014-10-14 NOTE — ED Notes (Signed)
EDp at bedside.

## 2014-10-14 NOTE — H&P (Addendum)
Patient ID: [name] MRN: [id number], DOB/AGE: July 27, 1946   Admit date: 10/14/2014   Primary Physician: [name] Primary Cardiologist: Mertie Moores, MD  Pt. Profile:  26M smoker with history of PVD (3.8cm AAA and 3.1cm R CIA),  HLD, COPD, CVA, CAD s/p multiple PCIs with mild systolic dysfunction (58-85%) who presents with chest pain and SVT.   Problem List  Past Medical History  Diagnosis Date  . Coronary artery disease     Multivessel, S/p stenting to LCx  . Tobacco dependence   . Ventricular dysfunction     Mild Left  . Dyslipidemia   . Atrial fibrillation      Brief episode of  . Myocardial infarction   . COPD (chronic obstructive pulmonary disease)   . Peripheral vascular disease   . Stroke     Past Surgical History  Procedure Laterality Date  . US echocardiography  12-26-2008    EF 40-45%  . Back surgery      cervical  . Stent      pt unsure of location     Allergies  Allergies  Allergen Reactions  . Aspirin Nausea Only and Other (See Comments)    Stomach upset  . Ibuprofen Hives    HPI  26M smoker with history of PVD (3.8cm AAA and 3.1cm R CIA),  HLD, COPD, CVA, CAD s/p multiple PCIs with mild systolic dysfunction (02-77%) who presents with chest pain and SVT.   [name] states that he woke up feeling poorly today, which is not out of the ordinary. He says it sometimes takes a few hours for him to feel okay. Around 2pm today while seated on the couch he developed abrupt onset chest pain ("dull ache") which radiated to both arms. He had associated HA, dyspnea, and nausea with green/brown emesis x 2. EMS was called and shortly after they arrived he had a syncopal event. EMS noted him to be in a tachycardia and shocked him out of the rhythm with apparent conversion to AF. (On my review of the strips, it appears to be a fairly regular narrow complex tach at about 250bpm)  He states that after the shock, his chest pain and shortness  of breath improved.  Post shock ECG demonstrated AF with anterior ST depressions. He was then transferred to the [facility] ED.   On arrival to the ER he was hemodynamically stable and in AF. 120bpm, 95/67, 96% on RA. Labs were notable for K 4.1, Cr 0.73, POC TnI 0.13, BNP 135, lactate 2.26 (ULN 2.0). CXR demonstrated ? right infrahilar airspace opacification (PNA v. aspiration)   Home Medications  Prior to Admission medications   Medication Sig Start Date End Date Taking? Authorizing Provider  albuterol (PROAIR HFA) 108 (90 BASE) MCG/ACT inhaler Inhale into the lungs as directed.  02/23/14 02/23/15 Yes Historical Provider, MD  aspirin EC 81 MG tablet Take 81 mg by mouth daily.   Yes Historical Provider, MD  atorvastatin (LIPITOR) 20 MG tablet Take 20 mg by mouth daily.     Yes Historical Provider, MD  escitalopram (LEXAPRO) 20 MG tablet Take 20 mg by mouth daily.     Yes Historical Provider, MD  HYDROcodone-acetaminophen (VICODIN) 5-500 MG per tablet Take 1 tablet by mouth as needed for pain.    Yes Historical Provider, MD  lisinopril (PRINIVIL,ZESTRIL) 5 MG tablet take 1 tablet by mouth once daily 12/06/12  Yes Thayer Headings, MD  metoprolol tartrate (LOPRESSOR) 25 MG tablet Take 0.5 tablets (  12.5 mg total) by mouth 2 (two) times daily. 07/25/14  Yes Josue Hector, MD  mometasone (NASONEX) 50 MCG/ACT nasal spray Place 2 sprays into the nose daily.  04/06/14 10/14/14 Yes Historical Provider, MD  nitroGLYCERIN (NITROSTAT) 0.4 MG SL tablet Place 0.4 mg under the tongue every 5 (five) minutes as needed.     Yes Historical Provider, MD  rosuvastatin (CRESTOR) 10 MG tablet take 1 tablet by mouth once daily 05/17/14   Thayer Headings, MD    Family History  Family History  Problem Relation Age of Onset  . AAA (abdominal aortic aneurysm) Mother   . Cancer Father     bone marrow    Social History  History   Social History  . Marital Status: Married    Spouse Name: N/A    Number of Children: N/A  .  Years of Education: N/A   Occupational History  . Not on file.   Social History Main Topics  . Smoking status: Current Every Day Smoker -- 0.50 packs/day for 50 years    Types: Cigarettes  . Smokeless tobacco: Never Used  . Alcohol Use: No  . Drug Use: No  . Sexual Activity: Not on file   Other Topics Concern  . Not on file   Social History Narrative     Review of Systems General:  No chills, fever, night sweats.  Cardiovascular:  See HPI Dermatological: No rash, lesions/masses Respiratory: No cough Urologic: No hematuria, dysuria Abdominal:   No nausea, vomiting, diarrhea, bright red blood per rectum, melena, or hematemesis Neurologic:  No visual changes, wkns, changes in mental status. All other systems reviewed and are otherwise negative except as noted above.  Physical Exam  Blood pressure 119/80, pulse 110, temperature 97.5 F (36.4 C), temperature source Oral, resp. rate 18, height 5\' 8"  (1.727 m), weight 63.05 kg (139 lb), SpO2 96 %.  General: Pleasant, NAD, thin Psych: Normal affect. Neuro: Alert and oriented X 3. Moves all extremities spontaneously. HEENT: Normal  Neck: Supple without bruits or JVD. Lungs:  Resp regular and unlabored, faint expiratory wheezes Heart: Irregularly irregular. No murmurs Abdomen: Soft, non-tender, non-distended, BS + x 4.  Extremities: No clubbing, cyanosis or edema. DP/PT/Radials 2+ and equal bilaterally.  Labs  Troponin [facility] of Care Test)  Recent Labs  10/14/14 1736  TROPIPOC 0.13*   No results for input(s): CKTOTAL, CKMB, TROPONINI in the last 72 hours. Lab Results  Component Value Date   WBC 8.7 10/14/2014   HGB 13.6 10/14/2014   HCT 40.0 10/14/2014   MCV 90.0 10/14/2014   PLT 190 10/14/2014    Recent Labs Lab 10/14/14 1727 10/14/14 1738  NA 138 141  K 4.1 4.1  CL 104 100  CO2 28  --   BUN 12 14  CREATININE 0.73 0.70  CALCIUM 8.7  --   PROT 6.5  --   BILITOT 0.6  --   ALKPHOS 72  --   ALT 11  --     AST 21  --   GLUCOSE 135* 130*   Lab Results  Component Value Date   CHOL 93 03/24/2014   HDL 30.80* 03/24/2014   LDLCALC 48 03/24/2014   TRIG 72.0 03/24/2014   No results found for: DDIMER   Radiology/Studies  Dg Chest Port 1 View  10/14/2014   CLINICAL DATA:  Chest pain.  EXAM: PORTABLE CHEST - 1 VIEW  COMPARISON:  12/18/2009.  FINDINGS: Trachea is midline. Heart size is accentuated by AP  technique. Defibrillator pad overlies the left hilum. Biapical pleural parenchymal scarring, right greater than left. Lungs appear mildly hyperinflated. There may be right infrahilar airspace opacification. No pleural fluid.  IMPRESSION: Question right infrahilar airspace opacification, suggesting pneumonia or aspiration.   Electronically Signed   By: Lorin Picket M.D.   On: 10/14/2014 17:24   Cardiac Cath DATE OF PROCEDURE: 09/02/2008 DATE OF DISCHARGE:   CARDIAC CATHETERIZATION  INDICATIONS: Prolonged chest pain starting at 9:30 a.m. on September 01, 2008. The patient has had continuous, although gradually decreasing chest pain over the past 24-30 hours. He was having mild residual discomfort today and with progressive abnormalities in cardiac markers, Dr. Lovena Le has asked that he undergo coronary angiography and possible PCI if appropriate. The patient has a prior history of right coronary bare-metal stents 2002 by Dr. Eustace Quail. He had residual 60-70% mid circumflex at that time that was treated medically.  PROCEDURE PERFORMED: 1. Left heart catheterization. 2. Selective coronary angiography. 3. Left ventriculography.  DESCRIPTION: After informed consent, a 6-French sheath was placed via the right femoral artery using a modified Seldinger technique. A 6- [place], A2 multipurpose catheter was then used for hemodynamic recordings, left ventriculography by hand injection, and selective right coronary angiography. A #4 and  subsequently a #5, 6-French left Judkins catheter was used for left coronary angiography.  After reviewing the anatomy, the case was terminated, and the patient taken back to the Coronary Care Unit.  RESULTS: 1. Hemodynamic data:  a. Aortic pressure 119/67.  b. Left ventricular pressure 122/21 mmHg. 2. Left ventriculography: The left ventricle demonstrates moderate to  moderately severe inferior wall hypokinesis. The overall ejection  fraction is 40-45%. No mitral regurgitation is noted. 3. Coronary angiography.  a. Left main coronary is widely patent.  b. Left anterior descending coronary artery: The left anterior  descending coronary artery contains moderately severe disease with  an eccentric 60% proximal stenosis and a segmental mid region of  60-70% stenosis after the first septal perforator. A large first  diagonal contains ostial 60-70% stenosis and the mid diagonal  contains 70-80% stenosis. LAD wraps around the left ventricular  apex.  c. Circumflex artery: Circumflex is totally occluded  proximally and is the suspected source of the patient's presenting  myocardial infarction. No collaterals are noted to fill this  territory. The patient had the time of the catheterization is  pain free.  d. Right coronary artery: The right coronary artery is totally  occluded in the midvessel after the first acute marginal branch.  The acute marginal branch supplies collaterals to the distal PDA.  No collaterals are noted to fill the distal circumflex.  CONCLUSION: 1. Lateral wall infarction, 24-60 hours in duration. The patient is  currently pain free. Infarction secondary to acute occlusion of  the circumflex coronary artery on the a.m. of December 25. 2. Chronic total occlusion of the right coronary artery in the mid  segment  with right-to-right collaterals. The patient has  moderately severe proximal and mid LAD disease and diagonal  disease. 3. Left ventricular dysfunction with moderately severe inferior wall  hypokinesis. Anterior wall function is hypercontractile. EF is 45-  50%.    ECG  10/14/14 @ 16:20: AF, rate controlled. Abnormal R wave progression. NSSTTWC 03/24/14: SB, NSSTTWC  ASSESSMENT AND PLAN  87M smoker with history of PVD (3.8cm AAA and 3.1cm R CIA),  HLD, COPD, CVA, CAD s/p multiple PCIs with mild systolic dysfunction (38-25%) who presents with chest pain and SVT. Etiology of the  narrow complex tach includes AT, AFL with near 1:1 conduction, AVNRT, AVRT, JT, or AF with RVR. An relatively narrow VT (origin near or within specialized conduction system) is another theoretical possibility. Unclear if the NSTEMI was entirely 2/2 the tachycardia or was a factor that actually led to tachycardia.  Of note, at the time of his last cath at [facility] in 2009, his LCx and RCA were both occluded (he states one vessel was stented shortly after than in [place]) so it is likely that he would have an NSTEMI with this type of tachycardia. He is now in AF with CHADSVASC of 4 (Age, PVD, prior CVA, vasc dz) and will need systemic anticoagulation.   SVT, now in rate controlled AF 1. increase lopressor from 12.5mg  BID to 25mg  TID 2. Check TSH 3. TTE 4. K>4, Mg>2 5. UFH gtt  NSTEMI 1. Increase lopressor per above 2. increase atorva to 80mg  3. Continue aspirin 4. Cycle troponins 5. TTE 6. Consider cath 7. A1c, lipids  Tobacco abuse. He states he would like to quick 1. Nicotine patch 2. Smoking cessation counseling  Signed, Lamar Sprinkles, MD 10/14/2014, 7:47 PM

## 2014-10-14 NOTE — ED Notes (Addendum)
Doctor Jalene Mullet at bedside.

## 2014-10-14 NOTE — ED Notes (Signed)
X-ray at bedside

## 2014-10-14 NOTE — ED Provider Notes (Signed)
CSN: [id number]     Arrival date & time 10/14/14  1614 History   None    Chief Complaint  Patient presents with  . Chest Pain     (Consider location/radiation/quality/duration/timing/severity/associated sxs/prior Treatment) HPI The patient poor she started having chest pain at about 2:45. He reports his pain was in the center of his chest and it was radiating out into both arms. He reports the pain was very intense and he became lightheaded and briefly lost consciousness. EMS arrived to find the patient conscious but in distress and a rhythm strip that showed ventricular tachycardia. They report they had palpable pulses and the patient was given an unsynchronized shock in his home. They report this converted the patient and have EKGs documenting atrial fibrillation subsequent to the shock. They report the patient never had loss of consciousness during their treatment and did not lose pulses. He never underwent chest compressions. Patient reports that after the episode of the cardioversion he did not have any chest pain and is denying chest pain at this point in time. There is no longer any arm pain either. The patient denies he had any abdominal pain or groin pain at the time of this incident. Past Medical History  Diagnosis Date  . Coronary artery disease     Multivessel, S/p stenting to LCx  . Tobacco dependence   . Ventricular dysfunction     Mild Left  . Dyslipidemia   . Atrial fibrillation      Brief episode of  . Myocardial infarction   . COPD (chronic obstructive pulmonary disease)   . Peripheral vascular disease   . Stroke    Past Surgical History  Procedure Laterality Date  . US echocardiography  12-26-2008    EF 40-45%  . Back surgery      cervical  . Stent      pt unsure of location   Family History  Problem Relation Age of Onset  . AAA (abdominal aortic aneurysm) Mother   . Cancer Father     bone marrow   History  Substance Use Topics  . Smoking status: Current  Every Day Smoker -- 0.50 packs/day for 50 years    Types: Cigarettes  . Smokeless tobacco: Never Used  . Alcohol Use: No    Review of Systems 10 Systems reviewed and are negative for acute change except as noted in the HPI.   Allergies  Aspirin and Ibuprofen  Home Medications   Prior to Admission medications   Medication Sig Start Date End Date Taking? Authorizing Provider  albuterol (PROAIR HFA) 108 (90 BASE) MCG/ACT inhaler Inhale into the lungs as directed.  02/23/14 02/23/15 Yes Historical Provider, MD  aspirin EC 81 MG tablet Take 81 mg by mouth daily.   Yes Historical Provider, MD  atorvastatin (LIPITOR) 20 MG tablet Take 20 mg by mouth daily.     Yes Historical Provider, MD  escitalopram (LEXAPRO) 20 MG tablet Take 20 mg by mouth daily.     Yes Historical Provider, MD  HYDROcodone-acetaminophen (VICODIN) 5-500 MG per tablet Take 1 tablet by mouth as needed for pain.    Yes Historical Provider, MD  lisinopril (PRINIVIL,ZESTRIL) 5 MG tablet take 1 tablet by mouth once daily 12/06/12  Yes Thayer Headings, MD  metoprolol tartrate (LOPRESSOR) 25 MG tablet Take 0.5 tablets (12.5 mg total) by mouth 2 (two) times daily. 07/25/14  Yes Josue Hector, MD  mometasone (NASONEX) 50 MCG/ACT nasal spray Place 2 sprays into the nose  daily.  04/06/14 10/14/14 Yes Historical Provider, MD  nitroGLYCERIN (NITROSTAT) 0.4 MG SL tablet Place 0.4 mg under the tongue every 5 (five) minutes as needed.     Yes Historical Provider, MD   BP 111/67 mmHg  Pulse 77  Temp(Src) 97.8 F (36.6 C) (Oral)  Resp 16  Ht 5\' 6"  (1.676 m)  Wt 126 lb 12.2 oz (57.5 kg)  BMI 20.47 kg/m2  SpO2 99% Physical Exam  Constitutional: He is oriented to person, place, and time.  The patient is thin and mildly deconditioned. He is not in acute respiratory distress. His mental status is clear. The patient is nontoxic.  HENT:  Head: Normocephalic and atraumatic.  Eyes: EOM are normal. Pupils are equal, round, and reactive to light.   Neck: Neck supple.  Cardiovascular: Normal rate, normal heart sounds and intact distal pulses.   Irregularly irregular  Pulmonary/Chest: Effort normal. He has wheezes.  Diminished breath sounds at the bases.  Abdominal: Soft. Bowel sounds are normal. He exhibits no distension. There is no tenderness.  Musculoskeletal: Normal range of motion. He exhibits no edema or tenderness.  Neurological: He is alert and oriented to person, place, and time. He has normal strength. Coordination normal. GCS eye subscore is 4. GCS verbal subscore is 5. GCS motor subscore is 6.  Skin: Skin is warm, dry and intact.  Psychiatric: He has a normal mood and affect.    ED Course  Procedures (including critical care time) Labs Review Labs Reviewed  COMPREHENSIVE METABOLIC PANEL - Abnormal; Notable for the following:    Glucose, Bld 135 (*)    Albumin 3.2 (*)    All other components within normal limits  BRAIN NATRIURETIC PEPTIDE - Abnormal; Notable for the following:    B Natriuretic Peptide 135.5 (*)    All other components within normal limits  CBC WITH DIFFERENTIAL/PLATELET - Abnormal; Notable for the following:    RBC 3.91 (*)    Hemoglobin 11.5 (*)    HCT 35.2 (*)    Neutrophils Relative % 87 (*)    Lymphocytes Relative 8 (*)    All other components within normal limits  I-STAT CG4 LACTIC ACID, ED - Abnormal; Notable for the following:    Lactic Acid, Venous 2.26 (*)    All other components within normal limits  I-STAT TROPOININ, ED - Abnormal; Notable for the following:    Troponin i, poc 0.13 (*)    All other components within normal limits  I-STAT CHEM 8, ED - Abnormal; Notable for the following:    Glucose, Bld 130 (*)    All other components within normal limits  CULTURE, BLOOD (ROUTINE X 2)  CULTURE, BLOOD (ROUTINE X 2)  LIPASE, BLOOD  PROTIME-INR  URINALYSIS, ROUTINE W REFLEX MICROSCOPIC  HEPARIN LEVEL (UNFRACTIONATED)  CBC  TROPONIN I  TROPONIN I  TROPONIN I  HEMOGLOBIN A1C   BRAIN NATRIURETIC PEPTIDE  TSH  MAGNESIUM  BASIC METABOLIC PANEL  LIPID PANEL  PROTIME-INR    Imaging Review Dg Chest Port 1 View  10/14/2014   CLINICAL DATA:  Chest pain.  EXAM: PORTABLE CHEST - 1 VIEW  COMPARISON:  12/18/2009.  FINDINGS: Trachea is midline. Heart size is accentuated by AP technique. Defibrillator pad overlies the left hilum. Biapical pleural parenchymal scarring, right greater than left. Lungs appear mildly hyperinflated. There may be right infrahilar airspace opacification. No pleural fluid.  IMPRESSION: Question right infrahilar airspace opacification, suggesting pneumonia or aspiration.   Electronically Signed   By: Lorin Picket  M.D.   On: 10/14/2014 17:24     EKG Interpretation None       CRITICAL CARE Performed by: Charlesetta Shanks   Total critical care time: 30  Critical care time was exclusive of separately billable procedures and treating other patients.  Critical care was necessary to treat or prevent imminent or life-threatening deterioration.  Critical care was time spent personally by me on the following activities: development of treatment plan with patient and/or surrogate as well as nursing, discussions with consultants, evaluation of patient's response to treatment, examination of patient, obtaining history from patient or surrogate, ordering and performing treatments and interventions, ordering and review of laboratory studies, ordering and review of radiographic studies, pulse oximetry and re-evaluation of patient's condition. Cardiology consultation. Dr. Tommi Rumps has evaluated the patient and will admit.  MDM   Final diagnoses:  Persistent atrial fibrillation  Acute coronary syndrome   As outlined above the patient had a severe chest pain prior to arrival with EMS defibrillation for unstable appearing patient with a tachycardia on rhythm strip. The patient presents in atrial fibrillation and chest pain is now resolved. The patient's EKG  from EMS had significant ST segment depressions concerning for ischemia. The patient was started on a heparin bolus and drip.    Charlesetta Shanks, MD 10/15/14 [phone]

## 2014-10-14 NOTE — ED Notes (Signed)
Patient place on pads.

## 2014-10-14 NOTE — ED Notes (Signed)
PT was at home he started having Chest Pain @ 1445. Pt then had a syncopal episodes. Pt then went into unsyn V-Tach. Pt was defib.  @200J . Pt received 250cc of NS. Pt was given 324 Asprin.  PT has bilateral 18 in [facility]. Pt is alert & O x4 on arrival.

## 2014-10-14 NOTE — Progress Notes (Signed)
ANTICOAGULATION CONSULT NOTE - Initial Consult  Pharmacy Consult for heparin Indication: chest pain/ACS  Allergies  Allergen Reactions  . Aspirin Nausea Only and Other (See Comments)    Stomach upset  . Ibuprofen Hives    Patient Measurements: Height: 5\' 8"  (172.7 cm) Weight: 139 lb (63.05 kg) IBW/kg (Calculated) : 68.4 Heparin Dosing Weight: 63 kg  Vital Signs: Temp: 97.5 F (36.4 C) (02/06 1622) Temp Source: Oral (02/06 1622) BP: 103/73 mmHg (02/06 1630) Pulse Rate: 116 (02/06 1630)  Labs: No results for input(s): HGB, HCT, PLT, APTT, LABPROT, INR, HEPARINUNFRC, CREATININE, CKTOTAL, CKMB, TROPONINI in the last 72 hours.  CrCl cannot be calculated (Patient has no serum creatinine result on file.).   Medical History: Past Medical History  Diagnosis Date  . Coronary artery disease     Multivessel, S/p stenting to LCx  . Tobacco dependence   . Ventricular dysfunction     Mild Left  . Dyslipidemia   . Atrial fibrillation      Brief episode of  . Myocardial infarction   . COPD (chronic obstructive pulmonary disease)   . Peripheral vascular disease   . Stroke     Medications:  See medication history  Assessment: 69 yo man to start heparin for CP.  He was not on anticoagulants PTA  Plan: Heparin bolus 3000 units and drip at 900 units/hr Check heparin level in 6 hours Daily HL and CBC while on heparin  Thanks for allowing pharmacy to be a part of this patient's care.  Excell Seltzer, PharmD Clinical Pharmacist, [phone]  10/14/2014,4:52 PM

## 2014-10-15 DIAGNOSIS — I2510 Atherosclerotic heart disease of native coronary artery without angina pectoris: Secondary | ICD-10-CM

## 2014-10-15 DIAGNOSIS — I481 Persistent atrial fibrillation: Secondary | ICD-10-CM

## 2014-10-15 DIAGNOSIS — I4819 Other persistent atrial fibrillation: Secondary | ICD-10-CM | POA: Insufficient documentation

## 2014-10-15 DIAGNOSIS — E780 Pure hypercholesterolemia: Secondary | ICD-10-CM

## 2014-10-15 DIAGNOSIS — Z720 Tobacco use: Secondary | ICD-10-CM

## 2014-10-15 DIAGNOSIS — I10 Essential (primary) hypertension: Secondary | ICD-10-CM

## 2014-10-15 DIAGNOSIS — J471 Bronchiectasis with (acute) exacerbation: Secondary | ICD-10-CM

## 2014-10-15 DIAGNOSIS — R634 Abnormal weight loss: Secondary | ICD-10-CM

## 2014-10-15 DIAGNOSIS — J449 Chronic obstructive pulmonary disease, unspecified: Secondary | ICD-10-CM

## 2014-10-15 LAB — BASIC METABOLIC PANEL
Anion gap: 4 — ABNORMAL LOW (ref 5–15)
BUN: 12 mg/dL (ref 6–23)
CALCIUM: 8.1 mg/dL — AB (ref 8.4–10.5)
CO2: 29 mmol/L (ref 19–32)
Chloride: 106 mmol/L (ref 96–112)
Creatinine, Ser: 0.58 mg/dL (ref 0.50–1.35)
GLUCOSE: 90 mg/dL (ref 70–99)
POTASSIUM: 3.6 mmol/L (ref 3.5–5.1)
Sodium: 139 mmol/L (ref 135–145)

## 2014-10-15 LAB — URINE MICROSCOPIC-ADD ON

## 2014-10-15 LAB — CBC
HEMATOCRIT: 30.8 % — AB (ref 39.0–52.0)
Hemoglobin: 10 g/dL — ABNORMAL LOW (ref 13.0–17.0)
MCH: 29 pg (ref 26.0–34.0)
MCHC: 32.5 g/dL (ref 30.0–36.0)
MCV: 89.3 fL (ref 78.0–100.0)
PLATELETS: 206 10*3/uL (ref 150–400)
RBC: 3.45 MIL/uL — ABNORMAL LOW (ref 4.22–5.81)
RDW: 13.3 % (ref 11.5–15.5)
WBC: 6.3 10*3/uL (ref 4.0–10.5)

## 2014-10-15 LAB — LIPID PANEL
CHOL/HDL RATIO: 3.2 ratio
CHOLESTEROL: 77 mg/dL (ref 0–200)
HDL: 24 mg/dL — AB (ref >39)
LDL CALC: 42 mg/dL (ref 0–99)
Triglycerides: 54 mg/dL (ref <150)
VLDL: 11 mg/dL (ref 0–40)

## 2014-10-15 LAB — URINALYSIS, ROUTINE W REFLEX MICROSCOPIC
BILIRUBIN URINE: NEGATIVE
Glucose, UA: 100 mg/dL — AB
HGB URINE DIPSTICK: NEGATIVE
Ketones, ur: NEGATIVE mg/dL
Leukocytes, UA: NEGATIVE
NITRITE: NEGATIVE
Protein, ur: 30 mg/dL — AB
Specific Gravity, Urine: 1.022 (ref 1.005–1.030)
Urobilinogen, UA: 1 mg/dL (ref 0.0–1.0)
pH: 6 (ref 5.0–8.0)

## 2014-10-15 LAB — PROTIME-INR
INR: 1.21 (ref 0.00–1.49)
Prothrombin Time: 15.4 seconds — ABNORMAL HIGH (ref 11.6–15.2)

## 2014-10-15 LAB — HEPARIN LEVEL (UNFRACTIONATED)
HEPARIN UNFRACTIONATED: 0.28 [IU]/mL — AB (ref 0.30–0.70)
Heparin Unfractionated: <0.1 IU/mL — ABNORMAL LOW (ref 0.30–0.70)
Heparin Unfractionated: 0.36 IU/mL (ref 0.30–0.70)

## 2014-10-15 LAB — T4, FREE: FREE T4: 1.12 ng/dL (ref 0.80–1.80)

## 2014-10-15 LAB — BRAIN NATRIURETIC PEPTIDE: B Natriuretic Peptide: 258.2 pg/mL — ABNORMAL HIGH (ref 0.0–100.0)

## 2014-10-15 LAB — EXPECTORATED SPUTUM ASSESSMENT W GRAM STAIN, RFLX TO RESP C: Special Requests: NORMAL

## 2014-10-15 LAB — TROPONIN I
TROPONIN I: 0.21 ng/mL — AB (ref <0.031)
TROPONIN I: 0.47 ng/mL — AB (ref <0.031)
Troponin I: 0.4 ng/mL — ABNORMAL HIGH (ref <0.031)

## 2014-10-15 LAB — MAGNESIUM: Magnesium: 1.8 mg/dL (ref 1.5–2.5)

## 2014-10-15 LAB — EXPECTORATED SPUTUM ASSESSMENT W REFEX TO RESP CULTURE

## 2014-10-15 LAB — TSH: TSH: 0.269 u[IU]/mL — AB (ref 0.350–4.500)

## 2014-10-15 MED ORDER — AMOXICILLIN-POT CLAVULANATE 875-125 MG PO TABS
1.0000 | ORAL_TABLET | Freq: Two times a day (BID) | ORAL | Status: DC
  Administered 2014-10-15 – 2014-10-17 (×4): 1 via ORAL
  Filled 2014-10-15 (×5): qty 1

## 2014-10-15 MED ORDER — TEMAZEPAM 7.5 MG PO CAPS
7.5000 mg | ORAL_CAPSULE | Freq: Every evening | ORAL | Status: DC | PRN
  Administered 2014-10-15 – 2014-10-16 (×2): 7.5 mg via ORAL
  Filled 2014-10-15 (×2): qty 1

## 2014-10-15 MED ORDER — HEPARIN BOLUS VIA INFUSION
2000.0000 [IU] | Freq: Once | INTRAVENOUS | Status: AC
  Administered 2014-10-15: 2000 [IU] via INTRAVENOUS
  Filled 2014-10-15: qty 2000

## 2014-10-15 MED ORDER — HEPARIN (PORCINE) IN NACL 100-0.45 UNIT/ML-% IJ SOLN
1400.0000 [IU]/h | INTRAMUSCULAR | Status: DC
  Administered 2014-10-15 – 2014-10-17 (×4): 1300 [IU]/h via INTRAVENOUS
  Filled 2014-10-15 (×5): qty 250

## 2014-10-15 NOTE — Progress Notes (Addendum)
On call fellow paged per pt request for sleep aid, a/w call back. Sherrie Mustache 5:32 PM   Page returned and telephone order given. 5:36 PM
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
## 2014-10-15 NOTE — Progress Notes (Signed)
ANTICOAGULATION CONSULT NOTE - Follow Up Consult  Pharmacy Consult for Heparin  Indication: chest pain/ACS  Allergies  Allergen Reactions  . Aspirin Nausea Only and Other (See Comments)    Stomach upset  . Ibuprofen Hives    Patient Measurements: Height: 5\' 6"  (167.6 cm) Weight: 126 lb 12.2 oz (57.5 kg) IBW/kg (Calculated) : 63.8  Vital Signs: Temp: 97.8 F (36.6 C) (02/06 2223) Temp Source: Oral (02/06 2223) BP: 111/67 mmHg (02/06 2223) Pulse Rate: 77 (02/06 2223)  Labs:  Recent Labs  10/14/14 1727 10/14/14 1738 10/15/14 0030  HGB 11.5* 13.6  --   HCT 35.2* 40.0  --   PLT 190  --   --   LABPROT 14.9  --   --   INR 1.15  --   --   HEPARINUNFRC  --   --  <0.10*  CREATININE 0.73 0.70  --   TROPONINI  --   --  0.47*    Estimated Creatinine Clearance: 72.9 mL/min (by C-G formula based on Cr of 0.7).   Assessment: Sub-therapeutic heparin level, no issues per RN.   Goal of Therapy:  Heparin level 0.3-0.7 units/ml Monitor platelets by anticoagulation protocol: Yes   Plan:  -Heparin 2000 units BOLUS -Increase heparin drip to 1100 units/hr -1100 HL -Daily CBC/HL -Monitor for bleeding  Narda Bonds 10/15/2014,2:23 AM

## 2014-10-15 NOTE — Progress Notes (Signed)
Utilization review completed.  

## 2014-10-15 NOTE — Progress Notes (Signed)
ANTICOAGULATION CONSULT NOTE - Follow Up Consult  Pharmacy Consult for Heparin  Indication: chest pain/ACS  Allergies  Allergen Reactions  . Aspirin Nausea Only and Other (See Comments)    Stomach upset  . Ibuprofen Hives    Patient Measurements: Height: 5\' 6"  (167.6 cm) Weight: 126 lb 12.2 oz (57.5 kg) IBW/kg (Calculated) : 63.8  Vital Signs: Temp: 98.3 F (36.8 C) (02/07 0500) Temp Source: Oral (02/07 0500) BP: 122/80 mmHg (02/07 1006) Pulse Rate: 61 (02/07 0500)  Labs:  Recent Labs  10/14/14 1727 10/14/14 1738 10/15/14 0030 10/15/14 0530 10/15/14 1034  HGB 11.5* 13.6  --  10.0*  --   HCT 35.2* 40.0  --  30.8*  --   PLT 190  --   --  206  --   LABPROT 14.9  --   --  15.4*  --   INR 1.15  --   --  1.21  --   HEPARINUNFRC  --   --  <0.10*  --  0.28*  CREATININE 0.73 0.70  --  0.58  --   TROPONINI  --   --  0.47* 0.40* 0.21*    Estimated Creatinine Clearance: 72.9 mL/min (by C-G formula based on Cr of 0.58).   Assessment: 69 yo male w/ hx PVD admitted with chest pain and SVT  Anticoagulation: hx afib (brief episode), not on AC PTA. Started on heparin per pharmacy for chest pain/r/o ACS. Heparin level just below goal on 1100 units/hr. CHADSVASC = 4  Goal of Therapy:  Heparin level 0.3-0.7 units/ml Monitor platelets by anticoagulation protocol: Yes   Plan:  -Increase IV heparin to 1300 units/hr -Recheck heparin level in 6 hrs -Daily heparin level and CBC.  Uvaldo Rising, BCPS  Clinical Pharmacist Pager [phone]  10/15/2014 12:21 PM

## 2014-10-15 NOTE — Progress Notes (Signed)
ANTICOAGULATION CONSULT NOTE - Follow Up Consult  Pharmacy Consult for Heparin  Indication: chest pain/ACS  Allergies  Allergen Reactions  . Aspirin Nausea Only and Other (See Comments)    Stomach upset  . Ibuprofen Hives    Patient Measurements: Height: 5\' 6"  (167.6 cm) Weight: 126 lb 12.2 oz (57.5 kg) IBW/kg (Calculated) : 63.8  Vital Signs: BP: 122/80 mmHg (02/07 1006) Pulse Rate: 74 (02/07 1439)  Labs:  Recent Labs  10/14/14 1727 10/14/14 1738 10/15/14 0030 10/15/14 0530 10/15/14 1034 10/15/14 1823  HGB 11.5* 13.6  --  10.0*  --   --   HCT 35.2* 40.0  --  30.8*  --   --   PLT 190  --   --  206  --   --   LABPROT 14.9  --   --  15.4*  --   --   INR 1.15  --   --  1.21  --   --   HEPARINUNFRC  --   --  <0.10*  --  0.28* 0.36  CREATININE 0.73 0.70  --  0.58  --   --   TROPONINI  --   --  0.47* 0.40* 0.21*  --     Estimated Creatinine Clearance: 72.9 mL/min (by C-G formula based on Cr of 0.58).   Assessment: 69 yo male w/ hx PVD admitted with chest pain and SVT  Anticoagulation: hx afib (brief episode), not on AC PTA. Started on heparin per pharmacy for chest pain/r/o ACS.CHADSVASC = 4.  Heparin level therapeutic on 1300 units/hr.  No bleeding or complications noted.  Goal of Therapy:  Heparin level 0.3-0.7 units/ml Monitor platelets by anticoagulation protocol: Yes   Plan:  -Continue IV heparin at 1300 units/hr -Confirm heparin level in 6 hrs -Daily heparin level and CBC.  Uvaldo Rising, BCPS  Clinical Pharmacist Pager [phone]  10/15/2014 7:33 PM

## 2014-10-15 NOTE — Progress Notes (Signed)
Patient Name: [name] Date of Encounter: 10/15/2014     Active Problems:   SVT (supraventricular tachycardia)    SUBJECTIVE  No further chest pain. Still with dyspnea. Cough with sputum, shows me cup with milky green sputum.   CURRENT MEDS . aspirin EC  81 mg Oral Daily  . atorvastatin  80 mg Oral q1800  . escitalopram  20 mg Oral Daily  . fluticasone  2 spray Each Nare Daily  . lisinopril  5 mg Oral Daily  . metoprolol tartrate  25 mg Oral 3 times per day  . nicotine  21 mg Transdermal Q24H    OBJECTIVE  Filed Vitals:   10/14/14 2106 10/14/14 2130 10/14/14 2223 10/15/14 0500  BP: 108/91 118/70 111/67 115/72  Pulse: 114 77 77 61  Temp:   97.8 F (36.6 C) 98.3 F (36.8 C)  TempSrc:   Oral Oral  Resp: 16 19 16 18   Height:   5\' 6"  (1.676 m)   Weight:   126 lb 12.2 oz (57.5 kg)   SpO2: 97% 99% 99% 94%    Intake/Output Summary (Last 24 hours) at 10/15/14 0827 Last data filed at 10/15/14 7371  Gross per 24 hour  Intake    240 ml  Output      0 ml  Net    240 ml   Filed Weights   10/14/14 1622 10/14/14 2223  Weight: 139 lb (63.05 kg) 126 lb 12.2 oz (57.5 kg)    PHYSICAL EXAM  General: Pleasant, NAD. Chronically ill appearing.  Neuro: Alert and oriented X 3. Moves all extremities spontaneously. Psych: Normal affect. HEENT:  Normal  Neck: Supple without bruits or JVD. Lungs:  Expiratory rhonchi, diffuse wheezing bilaterally  Heart: RRR no s3, s4, or murmurs. Abdomen: Soft, non-tender, non-distended, BS + x 4.  Extremities: No clubbing, cyanosis or edema. DP/PT/Radials 2+ and equal bilaterally.  Accessory Clinical Findings  CBC  Recent Labs  10/14/14 1727 10/14/14 1738 10/15/14 0530  WBC 8.7  --  6.3  NEUTROABS 7.6  --   --   HGB 11.5* 13.6 10.0*  HCT 35.2* 40.0 30.8*  MCV 90.0  --  89.3  PLT 190  --  062   Basic Metabolic Panel  Recent Labs  10/14/14 1727 10/14/14 1738 10/15/14 0030 10/15/14 0530  NA 138 141  --  139  K 4.1 4.1   --  3.6  CL 104 100  --  106  CO2 28  --   --  29  GLUCOSE 135* 130*  --  90  BUN 12 14  --  12  CREATININE 0.73 0.70  --  0.58  CALCIUM 8.7  --   --  8.1*  MG  --   --  1.8  --    Liver Function Tests  Recent Labs  10/14/14 1727  AST 21  ALT 11  ALKPHOS 72  BILITOT 0.6  PROT 6.5  ALBUMIN 3.2*    Recent Labs  10/14/14 1727  LIPASE 21   Cardiac Enzymes  Recent Labs  10/15/14 0030 10/15/14 0530  TROPONINI 0.47* 0.40*   Fasting Lipid Panel  Recent Labs  10/15/14 0530  CHOL 77  HDL 24*  LDLCALC 42  TRIG 54  CHOLHDL 3.2   Thyroid Function Tests  Recent Labs  10/15/14 0030  TSH 0.269*    TELE  NSR   Radiology/Studies  Dg Chest Port 1 View  10/14/2014   CLINICAL DATA:  Chest pain.  EXAM: PORTABLE CHEST - 1 VIEW  COMPARISON:  12/18/2009.  FINDINGS: Trachea is midline. Heart size is accentuated by AP technique. Defibrillator pad overlies the left hilum. Biapical pleural parenchymal scarring, right greater than left. Lungs appear mildly hyperinflated. There may be right infrahilar airspace opacification. No pleural fluid.  IMPRESSION: Question right infrahilar airspace opacification, suggesting pneumonia or aspiration.   Electronically Signed   By: Lorin Picket M.D.   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   He was sitting on the couch when he developed abrupt onset chest pain ("dull ache") which radiated to both arms. EMS was called and shortly after they arrived he had a syncopal event. EMS noted him to be in a tachycardia and shocked him out of the rhythm with apparent conversion to AF. (On fellow review of the strips, it appears to be a fairly regular narrow complex tach at about 250bpm)  SVT--> converted to rate controlled AF--> currently NSR.  -- Lopressor increased  from 12.5mg  BID to 25mg  TID -- TSH slightly depressed. Will obtain Free T3/T4 -- TTE pending  Atrial fibrillation- remote hx of this, not on any AC. -- CHADSVASC of at least 5 (Age, PVD, prior CVA, CHF) and will need systemic anticoagulation.  -- Currently on heparin gtt. If no plans for further ischemic eval would convert him to NOAC   Possible PNA- Cough w/ milky green sputum x6 months, weight loss 9-30lbs over the past year. Increase lactic acid 2.26. Blood cultures pending.  -- CXR with possible PNA vs aspiration.  -- Pulm consult appreciated. Sent a sputum sample  -- Will hold on antibiotics until seen by Pulmonary  NSTEMI- troponin 0.47--> 0.40. Likely demand ischemia in the setting of SVT and underlying pulmonary process -- Further ischemic eval dependant on pulmonary recommendations. He may need a LHC for further deliniation of coronary anatomy but his main problem at present seems to pulmonary related.   -- Continue ASA, BB,statin  Tobacco abuse- He states he would like to quick -- Continue Nicotine patch -- Smoking cessation counseling   Signed, THOMPSON, KATHRYN R PA-C  I have personally seen and examined that patient and agree with note as outlined by Angelena Form PA with minor changes.  Admitted after severe coughing episode yesterday and then developed chest pain.  He has had SOB for over a year with cough and increased sputum production for 6 months and marked unintentional weight loss. He gets CP rarely maybe once monthly but SOB seems to be the main complaint.  Was noted to have SVT on EMS arrival and shocked to afib and now converted to NSR.  He had a syncopal episode after this.  He remains SOB an coughing up mildly thick green sputum.  Will send off a sputum sample and get pulmonary consult.  Chest xray with ? PNA.  Will hold on antibiotics until seen by pulmonary.  I suspect that troponin elevation was more demand ischemia in the setting of underlying pulmonary  process and SVT.  Would workup underlying pulmonary issue first and then decide on further ischemic w/u. He has known CAD.  Continue BB for SVT and PAF suppression.  His CHADs2VASC score is high and apparently has remote history of PAF in the past so need to be on long term anticoagulation.  Will continue on Heparin for now and change to NOAC once workup complete.  Signed: Fransico Him, MD [facility] 09/14/2014

## 2014-10-16 ENCOUNTER — Inpatient Hospital Stay (HOSPITAL_COMMUNITY): Payer: Medicare Other

## 2014-10-16 DIAGNOSIS — R053 Chronic cough: Secondary | ICD-10-CM | POA: Insufficient documentation

## 2014-10-16 DIAGNOSIS — R05 Cough: Secondary | ICD-10-CM

## 2014-10-16 DIAGNOSIS — I471 Supraventricular tachycardia: Secondary | ICD-10-CM

## 2014-10-16 DIAGNOSIS — I4891 Unspecified atrial fibrillation: Secondary | ICD-10-CM

## 2014-10-16 DIAGNOSIS — R918 Other nonspecific abnormal finding of lung field: Secondary | ICD-10-CM

## 2014-10-16 DIAGNOSIS — I472 Ventricular tachycardia: Secondary | ICD-10-CM

## 2014-10-16 LAB — HEMOGLOBIN A1C
Hgb A1c MFr Bld: 5.9 % — ABNORMAL HIGH (ref 4.8–5.6)
MEAN PLASMA GLUCOSE: 123 mg/dL

## 2014-10-16 LAB — EXPECTORATED SPUTUM ASSESSMENT W REFEX TO RESP CULTURE

## 2014-10-16 LAB — EXPECTORATED SPUTUM ASSESSMENT W GRAM STAIN, RFLX TO RESP C

## 2014-10-16 LAB — CBC
HEMATOCRIT: 32.7 % — AB (ref 39.0–52.0)
Hemoglobin: 10.8 g/dL — ABNORMAL LOW (ref 13.0–17.0)
MCH: 29.3 pg (ref 26.0–34.0)
MCHC: 33 g/dL (ref 30.0–36.0)
MCV: 88.9 fL (ref 78.0–100.0)
Platelets: 224 10*3/uL (ref 150–400)
RBC: 3.68 MIL/uL — ABNORMAL LOW (ref 4.22–5.81)
RDW: 13.3 % (ref 11.5–15.5)
WBC: 7 10*3/uL (ref 4.0–10.5)

## 2014-10-16 LAB — HEPARIN LEVEL (UNFRACTIONATED)
HEPARIN UNFRACTIONATED: 0.47 [IU]/mL (ref 0.30–0.70)
Heparin Unfractionated: 0.48 IU/mL (ref 0.30–0.70)

## 2014-10-16 LAB — T3, FREE: T3 FREE: 2.8 pg/mL (ref 2.0–4.4)

## 2014-10-16 MED ORDER — SODIUM CHLORIDE 0.9 % IJ SOLN: 3.0000 mL | INTRAMUSCULAR | Status: DC | PRN

## 2014-10-16 MED ORDER — TIOTROPIUM BROMIDE MONOHYDRATE 18 MCG IN CAPS
18.0000 ug | ORAL_CAPSULE | Freq: Every day | RESPIRATORY_TRACT | Status: DC
  Administered 2014-10-16: 18 ug via RESPIRATORY_TRACT
  Filled 2014-10-16: qty 5

## 2014-10-16 MED ORDER — SODIUM CHLORIDE 0.9 % IV SOLN
1.0000 mL/kg/h | INTRAVENOUS | Status: DC
  Administered 2014-10-16 – 2014-10-17 (×2): 1 mL/kg/h via INTRAVENOUS

## 2014-10-16 MED ORDER — SODIUM CHLORIDE 0.9 % IJ SOLN
3.0000 mL | Freq: Two times a day (BID) | INTRAMUSCULAR | Status: DC
Start: 2014-10-16 — End: 2014-10-17
  Administered 2014-10-16: 3 mL via INTRAVENOUS

## 2014-10-16 MED ORDER — SODIUM CHLORIDE 0.9 % IV SOLN: 250.0000 mL | INTRAVENOUS | Status: DC | PRN

## 2014-10-16 NOTE — Progress Notes (Signed)
Subjective: No CP.  Breathing better than yesterday.    Objective: Vital signs in last 24 hours: Temp:  [98.4 F (36.9 C)-98.7 F (37.1 C)] 98.4 F (36.9 C) (02/08 0501) Pulse Rate:  [68-76] 68 (02/08 0501) Resp:  [18] 18 (02/08 0501) BP: (118-122)/(67-80) 118/67 mmHg (02/08 0501) SpO2:  [93 %-95 %] 95 % (02/08 0501) Last BM Date: 10/14/14  Intake/Output from previous day: 02/07 0701 - 02/08 0700 In: 2239 [P.O.:720; I.V.:1519] Out: 1525 [Urine:1525] Intake/Output this shift:    Medications Current Facility-Administered Medications  Medication Dose Route Frequency Provider Last Rate Last Dose  . 0.9 %  sodium chloride infusion   Intravenous Continuous Charlesetta Shanks, MD 125 mL/hr at 10/15/14 1634    . acetaminophen (TYLENOL) tablet 650 mg  650 mg Oral Q4H PRN Lamar Sprinkles, MD      . albuterol (PROVENTIL) (2.5 MG/3ML) 0.083% nebulizer solution 3 mL  3 mL Inhalation Q4H PRN Lamar Sprinkles, MD   3 mL at 10/15/14 0519  . amoxicillin-clavulanate (AUGMENTIN) 875-125 MG per tablet 1 tablet  1 tablet Oral Q12H Deneise Lever, MD   1 tablet at 10/15/14 2145  . aspirin EC tablet 81 mg  81 mg Oral Daily Lamar Sprinkles, MD   81 mg at 10/15/14 1005  . atorvastatin (LIPITOR) tablet 80 mg  80 mg Oral q1800 Lamar Sprinkles, MD   80 mg at 10/15/14 1721  . escitalopram (LEXAPRO) tablet 20 mg  20 mg Oral Daily Lamar Sprinkles, MD   20 mg at 10/15/14 1006  . fluticasone (FLONASE) 50 MCG/ACT nasal spray 2 spray  2 spray Each Nare Daily Lamar Sprinkles, MD   2 spray at 10/15/14 1006  . heparin ADULT infusion 100 units/mL (25000 units/250 mL)  1,300 Units/hr Intravenous Continuous Pat Patrick, RPH 13 mL/hr at 10/15/14 1437 1,300 Units/hr at 10/15/14 1437  . HYDROcodone-acetaminophen (NORCO/VICODIN) 5-325 MG per tablet 1 tablet  1 tablet Oral Q6H PRN Lamar Sprinkles, MD   1 tablet at 10/15/14 2145  . lisinopril (PRINIVIL,ZESTRIL) tablet 5 mg  5 mg Oral Daily Lamar Sprinkles, MD   5 mg at  10/15/14 1006  . metoprolol tartrate (LOPRESSOR) tablet 25 mg  25 mg Oral 3 times per day Lamar Sprinkles, MD   25 mg at 10/16/14 0559  . nicotine (NICODERM CQ - dosed in mg/24 hours) patch 21 mg  21 mg Transdermal Q24H Lamar Sprinkles, MD   21 mg at 10/15/14 2330  . nitroGLYCERIN (NITROSTAT) SL tablet 0.4 mg  0.4 mg Sublingual Q5 min PRN Lamar Sprinkles, MD      . ondansetron [facility]) injection 4 mg  4 mg Intravenous Q6H PRN Lamar Sprinkles, MD      . temazepam (RESTORIL) capsule 7.5 mg  7.5 mg Oral QHS PRN Thayer Headings, MD   7.5 mg at 10/15/14 2145    PE: General appearance: alert, cooperative and no distress Lungs: Coarse crackles at the right base.  No wheezing. Heart: irregularly irregular rhythm and No MM. Extremities: No LEE Pulses: 2+ and symmetric Skin: Warm and dry Neurologic: Grossly normal  Lab Results:   Recent Labs  10/14/14 1727 10/14/14 1738 10/15/14 0530 10/16/14 0051  WBC 8.7  --  6.3 7.0  HGB 11.5* 13.6 10.0* 10.8*  HCT 35.2* 40.0 30.8* 32.7*  PLT 190  --  206 224   BMET  Recent Labs  10/14/14 1727 10/14/14 1738 10/15/14 0530  NA 138 141 139  K 4.1 4.1 3.6  CL  104 100 106  CO2 28  --  29  GLUCOSE 135* 130* 90  BUN 12 14 12   CREATININE 0.73 0.70 0.58  CALCIUM 8.7  --  8.1*   PT/INR  Recent Labs  10/14/14 1727 10/15/14 0530  LABPROT 14.9 15.4*  INR 1.15 1.21   Cholesterol  Recent Labs  10/15/14 0530  CHOL 77   Lipid Panel     Component Value Date/Time   CHOL 77 10/15/2014 0530   TRIG 54 10/15/2014 0530   HDL 24* 10/15/2014 0530   CHOLHDL 3.2 10/15/2014 0530   VLDL 11 10/15/2014 0530   LDLCALC 42 10/15/2014 0530   Cardiac Panel (last 3 results)  Recent Labs  10/15/14 0030 10/15/14 0530 10/15/14 1034  TROPONINI 0.47* 0.40* 0.21*   Echo Study Conclusions  - Left ventricle: The cavity size was mildly dilated. Wall thickness was normal. Systolic function was moderately reduced. The estimated ejection fraction was  in the range of 35% to 40%. There is akinesis of the inferoseptal myocardium. Features are consistent with a pseudonormal left ventricular filling pattern, with concomitant abnormal relaxation and increased filling pressure (grade 2 diastolic dysfunction). - Mitral valve: Calcified annulus. There was moderate regurgitation. - Left atrium: The atrium was mildly dilated. - Right ventricle: The cavity size was moderately dilated. Wall thickness was normal. - Right atrium: The atrium was mildly dilated. - Pulmonary arteries: Systolic pressure was moderately increased. PA peak pressure: 52 mm Hg (S).  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   He was sitting on the couch when he developed abrupt onset chest pain ("dull ache") which radiated to both arms. EMS was called and shortly after they arrived he had a syncopal event. EMS noted him to be in a tachycardia and shocked him out of the rhythm with apparent conversion to AF. (On fellow review of the strips, it appears to be a fairly regular narrow complex tach at about 250bpm)     SVT (supraventricular tachycardia)    Lopressor 25mg  TID.    Persistent atrial fibrillation  On heparin.  Back in SR with PACs, PVCs.  CHADSVASC 5.  Will need noac     Elevated troponin Likely from demand ischemia in the setting of SVT. 0.47 max.   EF now 35-40%, akinesis of the inferoseptal myocardium. PA pressure 3mmHg. He had hypokinesis in the inferior lateral wall on previous echo.    ASA, BB.    ? Ischemic eval.  Reduce ef could be from SVT.      HYPERCHOLESTEROLEMIA  IIA  Statin.  Very well controlled.    HYPERTENSION, BENIGN  Well controlled.    CAD, NATIVE VESSEL   Chronic systolic and diastolic CHF    Appears euvolemic.  I will KVO fluids which are at 133ml/hr.     Weight loss   COPD  with chronic bronchitis   Bronchiectasis with acute exacerbation  Started on augmentin by Pulm(see PCCM note).  Sputum/blood Culture processing.      Tobacco abuse disorder  Nicotine patch.  Seems committed to quitting.  His wife died four weeks ago which will make it harder.  He is eager to be discharged so he can take care of his home.  With the up coming cold weather he is worried about the water pipes freezing.  I think he needs another day of monitoring.  LOS: 2 days    HAGER, BRYAN PA-C 10/16/2014 9:16 AM  I have seen and examined the patient along with HAGER, BRYAN PA-C.  I have reviewed the chart, notes and new data.  I agree with PA's note.  Frequent PVCs and one 5-beat un of SVT on monitor. Unable to locate the EMS rhythm strip before defibrillation.  PLAN: Needs EP evaluation for possible AICD (if he had VT - secondary prevention; even if not clearly VT, EF 35-40% and may need EPS). On increased dose beta blocker. Will also need anticoagulation for atrial fibrillation at discharge. He really wants to leave today. I explained that he may have narrowly escaped dying and that he may require complicated therapy, including possible surgery.  Sanda Klein, MD, [place] [phone] 10/16/2014, 10:49 AM

## 2014-10-16 NOTE — Progress Notes (Signed)
  Echocardiogram 2D Echocardiogram has been performed.  Saifan Rayford FRANCES 10/16/2014, 9:01 AM

## 2014-10-16 NOTE — Progress Notes (Signed)
No new complaints States that he is breathing better No distress  Filed Vitals:   10/15/14 1439 10/15/14 2130 10/16/14 0501 10/16/14 1013  BP:  121/68 118/67 131/75  Pulse: 74 76 68   Temp:  98.7 F (37.1 C) 98.4 F (36.9 C)   TempSrc:  Oral Oral   Resp:  18 18   Height:      Weight:      SpO2:  93% 95%    NAD Minimal rhonchi, no wheezes Reg, no M NABS No edema   IMP: Smoker Chronic cough Presumed COPD, chronic bronchitis Possible vague RLL infiltrate on CXR 2/6  PLAN/REC: Begin Spiriva Cont PRN albuterol Counseled re: smoking cessation Recheck CXR today OK to discharge any time from pulmonary perspective His discharge medications should be Spiriva and PRN albuterol MDI It needs to be re-emphasized how improtant smoking cessation is I have scheduled ROV with DR Annamaria Boots on Mar 2 @ 9:30 PCCM will sign off. Please call if we can be of further assistance  Merton Border, MD ; [facility] [phone].  After 5:30 PM or weekends, call [phone]

## 2014-10-16 NOTE — Progress Notes (Signed)
ANTICOAGULATION CONSULT NOTE - Follow Up Consult  Pharmacy Consult for Heparin  Indication: chest pain/ACS  Allergies  Allergen Reactions  . Aspirin Nausea Only and Other (See Comments)    Stomach upset  . Ibuprofen Hives    Patient Measurements: Height: 5\' 6"  (167.6 cm) Weight: 126 lb 12.2 oz (57.5 kg) IBW/kg (Calculated) : 63.8  Vital Signs: Temp: 98.7 F (37.1 C) (02/07 2130) Temp Source: Oral (02/07 2130) BP: 121/68 mmHg (02/07 2130) Pulse Rate: 76 (02/07 2130)  Labs:  Recent Labs  10/14/14 1727 10/14/14 1738  10/15/14 0030 10/15/14 0530 10/15/14 1034 10/15/14 1823 10/16/14 0051  HGB 11.5* 13.6  --   --  10.0*  --   --  10.8*  HCT 35.2* 40.0  --   --  30.8*  --   --  32.7*  PLT 190  --   --   --  206  --   --  224  LABPROT 14.9  --   --   --  15.4*  --   --   --   INR 1.15  --   --   --  1.21  --   --   --   HEPARINUNFRC  --   --   < > <0.10*  --  0.28* 0.36 0.47  CREATININE 0.73 0.70  --   --  0.58  --   --   --   TROPONINI  --   --   --  0.47* 0.40* 0.21*  --   --   < > = values in this interval not displayed.  Estimated Creatinine Clearance: 72.9 mL/min (by C-G formula based on Cr of 0.58).   Assessment: Therapeutic heparin level x 2  Goal of Therapy:  Heparin level 0.3-0.7 units/ml Monitor platelets by anticoagulation protocol: Yes   Plan:  -Continue heparin at 1300 units/hr -Daily CBC/HL -Monitor for bleeding  Narda Bonds 10/16/2014,1:28 AM

## 2014-10-17 ENCOUNTER — Encounter (HOSPITAL_COMMUNITY)
Admission: EM | Discharge status: Against Medical Advice | Payer: Self-pay | Source: Home / Self Care | Attending: Cardiovascular Disease

## 2014-10-17 ENCOUNTER — Telehealth: Payer: Self-pay | Admitting: Nurse Practitioner

## 2014-10-17 DIAGNOSIS — I471 Supraventricular tachycardia: Secondary | ICD-10-CM

## 2014-10-17 LAB — CULTURE, RESPIRATORY

## 2014-10-17 LAB — CBC
HCT: 30.3 % — ABNORMAL LOW (ref 39.0–52.0)
Hemoglobin: 9.9 g/dL — ABNORMAL LOW (ref 13.0–17.0)
MCH: 29.4 pg (ref 26.0–34.0)
MCHC: 32.7 g/dL (ref 30.0–36.0)
MCV: 89.9 fL (ref 78.0–100.0)
PLATELETS: 216 10*3/uL (ref 150–400)
RBC: 3.37 MIL/uL — AB (ref 4.22–5.81)
RDW: 13.4 % (ref 11.5–15.5)
WBC: 6.3 10*3/uL (ref 4.0–10.5)

## 2014-10-17 LAB — CULTURE, RESPIRATORY W GRAM STAIN

## 2014-10-17 LAB — HEPARIN LEVEL (UNFRACTIONATED): HEPARIN UNFRACTIONATED: 0.28 [IU]/mL — AB (ref 0.30–0.70)

## 2014-10-17 SURGERY — LEFT HEART CATHETERIZATION WITH CORONARY ANGIOGRAM
Anesthesia: LOCAL

## 2014-10-17 MED ORDER — METOPROLOL TARTRATE 25 MG PO TABS: 50.0000 mg | ORAL_TABLET | Freq: Two times a day (BID) | ORAL | Status: DC

## 2014-10-17 MED ORDER — AMOXICILLIN-POT CLAVULANATE 875-125 MG PO TABS: 1.0000 | ORAL_TABLET | Freq: Two times a day (BID) | ORAL | Status: DC

## 2014-10-17 MED ORDER — TIOTROPIUM BROMIDE MONOHYDRATE 18 MCG IN CAPS: 18.0000 ug | ORAL_CAPSULE | Freq: Every day | RESPIRATORY_TRACT | Status: DC

## 2014-10-17 MED ORDER — ATORVASTATIN CALCIUM 80 MG PO TABS: 80.0000 mg | ORAL_TABLET | Freq: Every day | ORAL | Status: DC

## 2014-10-17 MED ORDER — NICOTINE 21 MG/24HR TD PT24: 21.0000 mg | MEDICATED_PATCH | TRANSDERMAL | Status: DC

## 2014-10-17 MED ORDER — MOMETASONE FUROATE 50 MCG/ACT NA SUSP: 2.0000 | Freq: Every day | NASAL | Status: DC

## 2014-10-17 NOTE — Progress Notes (Signed)
ANTICOAGULATION CONSULT NOTE - Follow Up Consult  Pharmacy Consult for Heparin  Indication: chest pain/ACS  Allergies  Allergen Reactions  . Aspirin Nausea Only and Other (See Comments)    Stomach upset  . Ibuprofen Hives    Patient Measurements: Height: 5\' 6"  (167.6 cm) Weight: 126 lb 12.2 oz (57.5 kg) IBW/kg (Calculated) : 63.8  Vital Signs: Temp: 98.9 F (37.2 C) (02/09 0455) Temp Source: Oral (02/09 0455) BP: 139/78 mmHg (02/09 0455) Pulse Rate: 80 (02/09 0455)  Labs:  Recent Labs  10/14/14 1727 10/14/14 1738  10/15/14 0030 10/15/14 0530 10/15/14 1034  10/16/14 0051 10/16/14 0454 10/17/14 0755  HGB 11.5* 13.6  --   --  10.0*  --   --  10.8*  --  9.9*  HCT 35.2* 40.0  --   --  30.8*  --   --  32.7*  --  30.3*  PLT 190  --   --   --  206  --   --  224  --  216  LABPROT 14.9  --   --   --  15.4*  --   --   --   --   --   INR 1.15  --   --   --  1.21  --   --   --   --   --   HEPARINUNFRC  --   --   < > <0.10*  --  0.28*  < > 0.47 0.48 0.28*  CREATININE 0.73 0.70  --   --  0.58  --   --   --   --   --   TROPONINI  --   --   --  0.47* 0.40* 0.21*  --   --   --   --   < > = values in this interval not displayed.  Estimated Creatinine Clearance: 72.9 mL/min (by C-G formula based on Cr of 0.58).   Assessment: 69 yo male w/ hx PVD admitted with chest pain and SVT  Anticoagulation: hx afib (brief episode), not on AC PTA. Started on heparin per pharmacy for chest pain/r/o ACS.CHADSVASC = 4.   Heparin level slightly low this AM at 0.28  Goal of Therapy:  Heparin level 0.3-0.7 units/ml Monitor platelets by anticoagulation protocol: Yes   Plan:  Increase heparin to 1400 units / hr Follow up after cath today  Thank you. Anette Guarneri, PharmD [phone]  10/17/2014 10:21 AM

## 2014-10-17 NOTE — Telephone Encounter (Signed)
TCM   Per Call from [facility]   Scheduled on 02/12 @ 11:30 am

## 2014-10-17 NOTE — Discharge Summary (Signed)
Discharge Summary for Patient Leaving [facility]   Patient ID: [name],  MRN: [id number], DOB/AGE: 1946-02-08 69 y.o.  Admit date: 10/14/2014 Discharge date: 10/17/2014  Primary Care Provider: [name] Primary Cardiologist: Dr. [name]  Discharge Diagnoses Principal Problem:   SVT (supraventricular tachycardia) Active Problems:   HYPERCHOLESTEROLEMIA  IIA   HYPERTENSION, BENIGN   CAD, NATIVE VESSEL   Weight loss   Persistent atrial fibrillation   COPD with chronic bronchitis   Bronchiectasis with acute exacerbation   Tobacco abuse disorder   Chronic cough   Pulmonary infiltrate   Allergies Allergies  Allergen Reactions  . Aspirin Nausea Only and Other (See Comments)    Stomach upset  . Ibuprofen Hives    Procedures  Echocardiogram 10/16/2014 - Left ventricle: The cavity size was mildly dilated. Wall thickness was normal. Systolic function was moderately reduced. The estimated ejection fraction was in the range of 35% to 40%. There is akinesis of the inferoseptal myocardium. Features are consistent with a pseudonormal left ventricular filling pattern, with concomitant abnormal relaxation and increased filling pressure (grade 2 diastolic dysfunction). - Mitral valve: Calcified annulus. There was moderate regurgitation. - Left atrium: The atrium was mildly dilated. - Right ventricle: The cavity size was moderately dilated. Wall thickness was normal. - Right atrium: The atrium was mildly dilated. - Pulmonary arteries: Systolic pressure was moderately increased. PA peak pressure: 52 mm Hg (S).    Hospital Course  Note: Patient is currently being discharged [facility] REFUSING CARDIAC CATHETERIZATION AND FURTHER EP Eval  The patient is a pleasant 41 year year-old male with PMH of PVD, HLD, COPD, CVA, CAD s/p multiple PCI's with mild systolic dysfunction of 40-98% who presented to [facility] on 10/14/2014 with chest pain. En route to  the hospital, EMS noted him to be in possible ventricular tachycardia and shocked him out of the rhythm with apparent conversion to atrial fibrillation. Post shock EKG demonstrated AF with anterior ST depression. On arrival to ER, he was hemodynamically stable and in A. fib. Heart rate in the 120s. Blood pressure 95/67. Lactate acid 2.26. His metoprolol was increased from 12.5 twice a day to 25 mg every 8 hours with eventual conversion to NSR. Overnight cardiology fellow reviewed the initial strip which felt the patient may had SVT due to narrow complex rhythm. Overnight his troponin went up to 0.47 before trending down. It was felt the elevated troponin was likely demand ischemia in the setting of possible SVT and underlying pulmonary disease. However he will need further ischemic eval with left heart cath for further delineation of coronary anatomy due to onset of significant symptomatic arrhythmia. During the meantime, he was continued on IV heparin with plan to change to NOAC after initial workup is complete. Pulmonary service was consulted on 10/15/2014 for chronic productive cough. He was placed on Spiriva and prophylactic antibiotic with Augmentin. Echocardiogram was obtained on 2/80/2016 which showed EF 35-40%, akinesis of the inferior myocardium, grade 2 diastolic dysfunction, PA peak pressure 52 mmHg.   After discussing with the electrophysiology service, original plan was for the patient to undergo cardiac catheterization on 10/17/2014 to assess for underlying coronary disease. It is unclear at this time whether or not the patient truly had SVT versus ventricular tachycardia. The fact that patient converted after shock in the setting of his decreased EF is concerning that this patient may have had ventricular tachycardia on admission. Electrophysiology was consulted to see the patient after today's cath. However, patient refused to sign the  consent for cardiac catheterization and insist on leaving AMA. I  had a long discussion with the patient regarding possibility of out of the hospital cardiac arrest. He states he has to go home and fix his house before he loses his house. Patient also has been dealing with loss of his wife and his dog in recent weeks and likely has some degree of depression as well on top of the financial difficulty. Apparently, the reason he wished to go home at this time is because there was a dead rodent stuck inside a pipe of his house and due to the freezing temperature, his pipe will burst. The concern of further financial loss is driving him to go home at this time despite multiple attempts at discussing with him regarding the potential risk of going out of the hospital without further medical evaluation.  I have also discussed this [name] regarding the need for systemic anticoagulation. It appears that A. fib only occurred in the setting of post conversion from possible ventricular arrhythmia. His atrial fibrillation has not recurred since. We will hold off on adding systemic anticoagulation medication at this time. He will be discharged on aspirin for now. We have increased his beta blocker to to 50 mg twice a day to protect his heart against further arrhythmia. He has been instructed to contact cardiology if he has any recurrent symptoms.   Discharge Vitals Blood pressure 139/78, pulse 80, temperature 98.9 F (37.2 C), temperature source Oral, resp. rate 18, height 5\' 6"  (1.676 m), weight 126 lb 12.2 oz (57.5 kg), SpO2 93 %.  Filed Weights   10/14/14 1622 10/14/14 2223  Weight: 139 lb (63.05 kg) 126 lb 12.2 oz (57.5 kg)    Labs  CBC  Recent Labs  10/14/14 1727  10/16/14 0051 10/17/14 0755  WBC 8.7  < > 7.0 6.3  NEUTROABS 7.6  --   --   --   HGB 11.5*  < > 10.8* 9.9*  HCT 35.2*  < > 32.7* 30.3*  MCV 90.0  < > 88.9 89.9  PLT 190  < > 224 216  < > = values in this interval not displayed. Basic Metabolic Panel  Recent Labs  10/14/14 1727 10/14/14 1738  10/15/14 0030 10/15/14 0530  NA 138 141  --  139  K 4.1 4.1  --  3.6  CL 104 100  --  106  CO2 28  --   --  29  GLUCOSE 135* 130*  --  90  BUN 12 14  --  12  CREATININE 0.73 0.70  --  0.58  CALCIUM 8.7  --   --  8.1*  MG  --   --  1.8  --    Liver Function Tests  Recent Labs  10/14/14 1727  AST 21  ALT 11  ALKPHOS 72  BILITOT 0.6  PROT 6.5  ALBUMIN 3.2*    Recent Labs  10/14/14 1727  LIPASE 21   Cardiac Enzymes  Recent Labs  10/15/14 0030 10/15/14 0530 10/15/14 1034  TROPONINI 0.47* 0.40* 0.21*   Hemoglobin A1C  Recent Labs  10/15/14 0030  HGBA1C 5.9*   Fasting Lipid Panel  Recent Labs  10/15/14 0530  CHOL 77  HDL 24*  LDLCALC 42  TRIG 54  CHOLHDL 3.2   Thyroid Function Tests  Recent Labs  10/15/14 0030 10/15/14 1034  TSH 0.269*  --   T3FREE  --  2.8    Disposition  Pt is being discharged home  today in good condition.  Follow-up Plans & Appointments      Follow-up Information    Follow up with Truitt Merle, NP On 10/20/2014.   Specialty:  Nurse Practitioner   Why:  11:30am   Contact information:   [place] [place] [address] [phone]       Discharge Medications    Medication List    TAKE these medications        amoxicillin-clavulanate 875-125 MG per tablet  Commonly known as:  AUGMENTIN  Take 1 tablet by mouth every 12 (twelve) hours.     aspirin EC 81 MG tablet  Take 81 mg by mouth daily.     atorvastatin 80 MG tablet  Commonly known as:  LIPITOR  Take 1 tablet (80 mg total) by mouth daily at 6 PM.     escitalopram 20 MG tablet  Commonly known as:  LEXAPRO  Take 20 mg by mouth daily.     lisinopril 5 MG tablet  Commonly known as:  PRINIVIL,ZESTRIL  take 1 tablet by mouth once daily     metoprolol tartrate 25 MG tablet  Commonly known as:  LOPRESSOR  Take 2 tablets (50 mg total) by mouth 2 (two) times daily.     mometasone 50 MCG/ACT nasal spray  Commonly known as:   NASONEX  Place 2 sprays into the nose daily.     nicotine 21 mg/24hr patch  Commonly known as:  NICODERM CQ - dosed in mg/24 hours  Place 1 patch (21 mg total) onto the skin daily.     nitroGLYCERIN 0.4 MG SL tablet  Commonly known as:  NITROSTAT  Place 0.4 mg under the tongue every 5 (five) minutes as needed.     PROAIR HFA 108 (90 BASE) MCG/ACT inhaler  Generic drug:  albuterol  Inhale into the lungs as directed.     tiotropium 18 MCG inhalation capsule  Commonly known as:  SPIRIVA  Place 1 capsule (18 mcg total) into inhaler and inhale daily.     VICODIN 5-500 MG per tablet  Generic drug:  HYDROcodone-acetaminophen  Take 1 tablet by mouth as needed for pain.         Duration of Discharge Encounter   Greater than 30 minutes including physician time.  Hilbert Corrigan PA-C Pager: [phone] 10/17/2014, 11:51 AM

## 2014-10-17 NOTE — Progress Notes (Addendum)
Patient refused pre catheterization procedures. Would want further clarification in the morning. On call MD notified. The treatment team would see him in the morning to answer his questions as per MD. Will continue to monitor patient.

## 2014-10-17 NOTE — Progress Notes (Addendum)
EP to see after cath today for evaluation of potential AICD. Checked with [name], apparent came in after shock for VT per EMS. Per overnight fellow, he had narrow complex tachycardia, which is likely SVT. Strip lost. However his symptom is more consistent VT which is more likely to occur given his low EF. EP consulted for possible VT, however needed patient to undergo cath first.    Hilbert Corrigan PA Pager: [phone]  Addendum: 11:00am Informed by nursing staff that patient refused cardiac catheterization. I had a long conversation with the patient regarding potential risk and benefit of cardiac catheterization and the need for electrophysiology evaluation to rule out ventricular tachycardia which could have been responsible for the symptom and the shock he received prior to admission. After discussing the likelihood of sudden cardiac death without any further workup, patient states that he understands the risk and is determined to leave the hospital today. He states, he will sign out AMA if necessary. He understands the risk we have discussed.  I have discussed the case with my attending, we will increase his beta blocker to 50 mg twice a day. I will schedule close follow-up with Dr. [name] within 7 days.  Hilbert Corrigan PA Pager: [phone]   Unfortunately, he is adamant about refusing cath and insists on going home against our advice. He is polite, but firm about it. Recommend increasing beta blocker dose, arrange early follow up. Would not start anticoagulation under these circumstances. Continue aspirin.  Sanda Klein, MD, [facility] [phone] office [phone] pager

## 2014-10-17 NOTE — Care Management Note (Signed)
    Page 1 of 1   10/17/2014     4:08:20 PM CARE MANAGEMENT NOTE 10/17/2014  Patient:  [name], [name]   Account Number:  [id number]  Date Initiated:  10/17/2014  Documentation initiated by:  Marvetta Gibbons  Subjective/Objective Assessment:   Pt admitted for c/p     Action/Plan:   PTA Pt lived at home   Anticipated DC Date:  10/17/2014   Anticipated DC Plan:  HOME/SELF CARE         Choice offered to / List presented to:             Status of service:  Completed, signed off Medicare Important Message given?  YES (If response is "NO", the following Medicare IM given date fields will be blank) Date Medicare IM given:  10/17/2014 Medicare IM given by:  Marvetta Gibbons Date Additional Medicare IM given:   Additional Medicare IM given by:    Discharge Disposition:  HOME/SELF CARE  Per UR Regulation:  Reviewed for med. necessity/level of care/duration of stay  If discussed at [place] of Stay Meetings, dates discussed:    Comments:

## 2014-10-17 NOTE — Discharge Instructions (Signed)
Angina Pectoris  Angina pectoris is extreme discomfort in your chest, neck, or arm. Your doctor may call it just angina. It is caused by a lack of oxygen to your heart wall. It may feel like tightness or heavy pressure. It may feel like a crushing or squeezing pain. Some people say it feels like gas. It may go down your shoulders, back, and arms. Some people have symptoms other than pain. These include:  · Tiredness.  · Shortness of breath.  · Cold sweats.  · Feeling sick to your stomach (nausea).  There are four types of angina:  · Stable angina. This type often lasts the same amount of time each time it happens. Activity, stress, or excitement can bring it on. It often gets better after taking a medicine called nitroglycerin. This goes under your tongue.  · Unstable angina. This type can happen when you are not active or even during sleep. It can suddenly get worse or happen more often. It may not get better after taking the special medicine. It can last up to 30 minutes.  · Microvascular angina. This type is more common in women. It may be more severe or last longer than other types.  · Prinzmetal angina. This type often happens when you are not active or in the early morning hours.  HOME CARE   · Only take medicines as told by your doctor.  · Stay active or exercise more as told by your doctor.  · Limit very hard activity as told by your doctor.  · Limit heavy lifting as told by your doctor.  · Keep a healthy weight.  · Learn about and eat foods that are healthy for your heart.  · Do not use any tobacco such as cigarettes, chewing tobacco, or e-cigarettes.  GET HELP RIGHT AWAY IF:   · You have chest, neck, deep shoulder, or arm pain or discomfort that lasts more than a few minutes.  · You have chest, neck, deep shoulder, or arm pain or discomfort that goes away and comes back over and over again.  · You have heavy sweating that seems to happen for no reason.  · You have shortness of breath or trouble  breathing.  · Your angina does not get better after a few minutes of rest.  · Your angina does not get better after you take nitroglycerin medicine.  These can all be symptoms of a heart attack. Get help right away. Call your local emergency service (911 in [place] Do not  drive yourself to the hospital. Do not  wait to for your symptoms to go away.  MAKE SURE YOU:   · Understand these instructions.  · Will watch your condition.  · Will get help right away if you are not doing well or get worse.  Document Released: 02/11/2008 Document Revised: 08/30/2013 Document Reviewed: 12/27/2013  ExitCare® Patient Information ©2015 [employer]. This information is not intended to replace advice given to you by your health care provider. Make sure you discuss any questions you have with your health care provider.

## 2014-10-18 ENCOUNTER — Other Ambulatory Visit: Payer: Self-pay | Admitting: *Deleted

## 2014-10-18 DIAGNOSIS — I714 Abdominal aortic aneurysm, without rupture, unspecified: Secondary | ICD-10-CM

## 2014-10-18 DIAGNOSIS — I723 Aneurysm of iliac artery: Secondary | ICD-10-CM

## 2014-10-18 LAB — CULTURE, RESPIRATORY

## 2014-10-18 LAB — CULTURE, RESPIRATORY W GRAM STAIN

## 2014-10-18 NOTE — Telephone Encounter (Signed)
Patient contacted regarding discharge from [facility] on 10/17/14- patient left AMA.  Patient understands to follow up with provider- Daniel Merle, NP on Friday 10/20/14 at 11:30 AM at [facility] - Advised the patient to arrive at 11:15 AM for registration. Patient understands discharge instructions? Yes Patient understands medications and regiment? Yes  Patient understands to bring all medications to this visit? Yes

## 2014-10-18 NOTE — Progress Notes (Signed)
Pt left hospital AMA, nursing explained to pt the importance of staying to get procedures performed and that hospital visit may not be covered by insurance. Pt is aware. Pt has appointments to follow up with cardiologist. Pt VU.  Removed iv, tele monitor and pt signed AMA form. MD aware.

## 2014-10-20 ENCOUNTER — Ambulatory Visit (INDEPENDENT_AMBULATORY_CARE_PROVIDER_SITE_OTHER): Payer: Medicare Other | Admitting: Nurse Practitioner

## 2014-10-20 ENCOUNTER — Other Ambulatory Visit: Payer: Self-pay | Admitting: Nurse Practitioner

## 2014-10-20 ENCOUNTER — Encounter: Payer: Self-pay | Admitting: Nurse Practitioner

## 2014-10-20 VITALS — BP 122/62 | HR 58 | Ht 66.0 in | Wt 132.5 lb

## 2014-10-20 DIAGNOSIS — Z720 Tobacco use: Secondary | ICD-10-CM

## 2014-10-20 DIAGNOSIS — Z01810 Encounter for preprocedural cardiovascular examination: Secondary | ICD-10-CM

## 2014-10-20 DIAGNOSIS — I480 Paroxysmal atrial fibrillation: Secondary | ICD-10-CM

## 2014-10-20 DIAGNOSIS — I472 Ventricular tachycardia, unspecified: Secondary | ICD-10-CM

## 2014-10-20 DIAGNOSIS — I255 Ischemic cardiomyopathy: Secondary | ICD-10-CM

## 2014-10-20 DIAGNOSIS — I10 Essential (primary) hypertension: Secondary | ICD-10-CM

## 2014-10-20 DIAGNOSIS — E7800 Pure hypercholesterolemia, unspecified: Secondary | ICD-10-CM

## 2014-10-20 LAB — CBC
HCT: 32.7 % — ABNORMAL LOW (ref 39.0–52.0)
Hemoglobin: 10.8 g/dL — ABNORMAL LOW (ref 13.0–17.0)
MCHC: 33 g/dL (ref 30.0–36.0)
MCV: 89.2 fl (ref 78.0–100.0)
Platelets: 322 10*3/uL (ref 150.0–400.0)
RBC: 3.67 Mil/uL — ABNORMAL LOW (ref 4.22–5.81)
RDW: 13.6 % (ref 11.5–15.5)
WBC: 6.8 10*3/uL (ref 4.0–10.5)

## 2014-10-20 LAB — BASIC METABOLIC PANEL
BUN: 11 mg/dL (ref 6–23)
CO2: 34 mEq/L — ABNORMAL HIGH (ref 19–32)
Calcium: 9.1 mg/dL (ref 8.4–10.5)
Chloride: 104 mEq/L (ref 96–112)
Creatinine, Ser: 0.73 mg/dL (ref 0.40–1.50)
GFR: 113.86 mL/min (ref >60.00)
Glucose, Bld: 103 mg/dL — ABNORMAL HIGH (ref 70–99)
Potassium: 3.8 mEq/L (ref 3.5–5.1)
Sodium: 140 mEq/L (ref 135–145)

## 2014-10-20 LAB — PROTIME-INR
INR: 1.2 ratio — ABNORMAL HIGH (ref 0.8–1.0)
Prothrombin Time: 13.2 s — ABNORMAL HIGH (ref 9.6–13.1)

## 2014-10-20 LAB — APTT: aPTT: 33.9 s — ABNORMAL HIGH (ref 23.4–32.7)

## 2014-10-20 NOTE — Patient Instructions (Signed)
We will be checking the following labs today BMET, CBC, PT, PTT  Follow this list of medicines  Your provider has recommended a cardiac catherization  You are scheduled for a cardiac catheterization on Friday, February 19th with Dr. Burt Knack or associate.  Go to [facility] 2nd Floor Short Stay on Friday, February 19th at 8:30am.  Enter thru the [employer] entrance A No food or drink after midnight on Thursday. You may take your medications with a sip of water on the day of your procedure.   Coronary Angiogram A coronary angiogram, also called coronary angiography, is an X-ray procedure used to look at the arteries in the heart. In this procedure, a dye (contrast dye) is injected through a long, hollow tube (catheter). The catheter is about the size of a piece of cooked spaghetti and is inserted through your groin, wrist, or arm. The dye is injected into each artery, and X-rays are then taken to show if there is a blockage in the arteries of your heart.  LET [facility] CARE PROVIDER KNOW ABOUT:  Any allergies you have, including allergies to shellfish or contrast dye.   All medicines you are taking, including vitamins, herbs, eye drops, creams, and over-the-counter medicines.   Previous problems you or members of your family have had with the use of anesthetics.   Any blood disorders you have.   Previous surgeries you have had.  History of kidney problems or failure.   Other medical conditions you have.  RISKS AND COMPLICATIONS  Generally, a coronary angiogram is a safe procedure. However, about 1 person out of 1000 can have problems that may include:  Allergic reaction to the dye.  Bleeding/bruising from the access site or other locations.  Kidney injury, especially in people with impaired kidney function.  Stroke (rare).  Heart attack (rare).  Irregular rhythms (rare)  Death (rare)  BEFORE THE PROCEDURE   Do not eat or drink anything after midnight the  night before the procedure or as directed by your health care provider.   Ask your health care provider about changing or stopping your regular medicines. This is especially important if you are taking diabetes medicines or blood thinners.  PROCEDURE  You may be given a medicine to help you relax (sedative) before the procedure. This medicine is given through an intravenous (IV) access tube that is inserted into one of your veins.   The area where the catheter will be inserted will be washed and shaved. This is usually done in the groin but may be done in the fold of your arm (near your elbow) or in the wrist.   A medicine will be given to numb the area where the catheter will be inserted (local anesthetic).   The health care provider will insert the catheter into an artery. The catheter will be guided by using a special type of X-ray (fluoroscopy) of the blood vessel being examined.   A special dye will then be injected into the catheter, and X-rays will be taken. The dye will help to show where any narrowing or blockages are located in the heart arteries.    AFTER THE PROCEDURE   If the procedure is done through the leg, you will be kept in bed lying flat for several hours. You will be instructed to not bend or cross your legs.  The insertion site will be checked frequently.   The pulse in your feet or wrist will be checked frequently.   Additional blood tests, X-rays,  and an electrocardiogram may be done.

## 2014-10-20 NOTE — Progress Notes (Signed)
CARDIOLOGY OFFICE NOTE  Date:  10/20/2014    [name] Date of Birth: 1946/06/28 Medical Record #[id number]  PCP:  Sheral Apley  Cardiologist:  Nahser  Chief Complaint  Patient presents with  . Chest Pain    Post hospital/TOC visit - seen for Dr. [name]     History of Present Illness: [name] is a 69 y.o. male who presents today for a TOC/post hospital visit.   Note: Patient was discharged on 10/17/2014 [facility] AFTER REFUSING CARDIAC CATHETERIZATION AND FURTHER EP Eval  Mr. [name] has a history of PVD, HLD, COPD, CVA, CAD s/p multiple PCI's (to the RCA and to the LCX) with mild systolic dysfunction of 13-24% who presented to [facility] on 10/14/2014 with chest pain. En route to the hospital, EMS noted him to be in possible ventricular tachycardia and shocked him out of the rhythm with apparent conversion to atrial fibrillation. Post shock EKG demonstrated AF with anterior ST depression. On arrival to ER, he was hemodynamically stable and in A. fib. Heart rate was in the 120s. Blood pressure 95/67. Lactate acid 2.26. His metoprolol was increased from 12.5 twice a day to 25 mg every 8 hours with eventual conversion to NSR. Overnight cardiology fellow reviewed the initial strip which felt the patient may had SVT due to narrow complex rhythm. Overnight his troponin went up to 0.47 before trending down. It was felt the elevated troponin was likely demand ischemia in the setting of possible SVT and underlying pulmonary disease. However he was felt to need further ischemic eval with left heart cath for further delineation of coronary anatomy due to onset of significant symptomatic arrhythmia. He was continued on IV heparin with plan to change to NOAC after initial workup is complete. Pulmonary service was consulted on 10/15/2014 for chronic productive cough. He was placed on Spiriva and prophylactic antibiotic with Augmentin. Echocardiogram was obtained on 2/80/2016  which showed EF 35-40%, akinesis of the inferior myocardium, grade 2 diastolic dysfunction, PA peak pressure 52 mmHg.   After discussing with the electrophysiology service, original plan was for the patient to undergo cardiac catheterization on 10/17/2014 to assess for underlying coronary disease. It is unclear at this time whether or not the patient truly had SVT versus ventricular tachycardia. The fact that patient converted after shock in the setting of his decreased EF is concerning that this patient may have had ventricular tachycardia on admission. Electrophysiology was consulted to see the patient after today's cath. However, patient refused to sign the consent for cardiac catheterization and insist on leaving AMA. Long discussion ensued with the patient regarding possibility of out of the hospital cardiac arrest. He stated he had to go home and fix his house before he loses his house. Patient also has been dealing with loss of his wife and his dog in recent weeks and likely has some degree of depression as well on top of the financial difficulty. Apparently, the reason he wished to go home at this time is because there was a dead rodent stuck inside a pipe of his house and due to the freezing temperature, his pipe will burst. The concern of further financial loss is driving him to go home at this time despite multiple attempts at discussing with him regarding the potential risk of going out of the hospital without further medical evaluation.  There has also been the discussion about need for possible systemic anticoagulation. It appears that A. fib only occurred in the setting  of post conversion from possible ventricular arrhythmia. His atrial fibrillation has not recurred since. He was held off on adding systemic anticoagulation medication at this time and was discharged on aspirin for now. Beta blocker was increased to 50 mg twice a day.   He comes in here today. He is here alone. Very sad situation.  Has only been home a few days. Brought all of his medicine bottles except for Lisinopril and Spiriva. On 2 statins - Crestor and & Lipitor. He is not sure why. Says he has done ok since discharge with no issues. No more chest pain. Breathing is short but this is chronic. Has had no palpitations. No syncope. Not dizzy. Had 2 busted pipes at home. Now ready to proceed with cardiac cath. He is not sure if he wants to proceed with ICD implant if needed. He does have some nicotine patches and is wanting to quit smoking. He tells me that he does not have a dye allergy - but this has been noted on past discharge summary to pretreat. He tells me that he has a total of 3 stents - last one done around 2007 while he was in [place]. He is on just low dose aspirin.   Past Medical History  Diagnosis Date  . Coronary artery disease     Multivessel, S/p stenting to LCx  . Tobacco dependence   . Ventricular dysfunction     Mild Left  . Dyslipidemia   . Atrial fibrillation      Brief episode of  . Myocardial infarction   . COPD (chronic obstructive pulmonary disease)   . Peripheral vascular disease   . Stroke     Past Surgical History  Procedure Laterality Date  . US echocardiography  12-26-2008    EF 40-45%  . Back surgery      cervical  . Stent      pt unsure of location     Medications: Current Outpatient Prescriptions  Medication Sig Dispense Refill  . albuterol (PROAIR HFA) 108 (90 BASE) MCG/ACT inhaler Inhale into the lungs as directed.     [name] amoxicillin-clavulanate (AUGMENTIN) 875-125 MG per tablet Take 1 tablet by mouth every 12 (twelve) hours. 24 tablet 0  . aspirin EC 81 MG tablet Take 81 mg by mouth daily.    [name] atorvastatin (LIPITOR) 80 MG tablet Take 1 tablet (80 mg total) by mouth daily at 6 PM. 30 tablet 5  . escitalopram (LEXAPRO) 20 MG tablet Take 1 & 1/2 tablets by mouth daily    . HYDROcodone-acetaminophen (VICODIN) 5-500 MG per tablet Take 1 tablet by mouth as needed for pain.       . metoprolol tartrate (LOPRESSOR) 25 MG tablet Take 2 tablets (50 mg total) by mouth 2 (two) times daily. 30 tablet 10  . mometasone (NASONEX) 50 MCG/ACT nasal spray Place 2 sprays into the nose daily. 17 g 2  . nicotine (NICODERM CQ - DOSED IN MG/24 HOURS) 21 mg/24hr patch Place 1 patch (21 mg total) onto the skin daily. 28 patch 0  . nitroGLYCERIN (NITROSTAT) 0.4 MG SL tablet Place 0.4 mg under the tongue every 5 (five) minutes as needed.      [name] lisinopril (PRINIVIL,ZESTRIL) 5 MG tablet take 1 tablet by mouth once daily (Patient not taking: Reported on 10/20/2014) 90 tablet 3  . tiotropium (SPIRIVA) 18 MCG inhalation capsule Place 1 capsule (18 mcg total) into inhaler and inhale daily. (Patient not taking: Reported on 10/20/2014) 30 capsule 3  No current facility-administered medications for this visit.    Allergies: Allergies  Allergen Reactions  . Aspirin Nausea Only and Other (See Comments)    Stomach upset  . Ibuprofen Hives    Social History: The patient  reports that he has been smoking Cigarettes.  He has a 25 pack-year smoking history. He has never used smokeless tobacco. He reports that he does not drink alcohol or use illicit drugs.   Family History: The patient's family history includes AAA (abdominal aortic aneurysm) in his mother; Cancer in his father.   Review of Systems: Please see the history of present illness.   Otherwise, the review of systems is positive for cough, DOE, back pain, muscle pain, wheezing and headaches.   All other systems are reviewed and negative.   Physical Exam: VS:  BP 122/62 mmHg  Pulse 58  Ht 5\' 6"  (1.676 m)  Wt 132 lb 8 oz (60.102 kg)  BMI 21.40 kg/m2 .  BMI Body mass index is 21.4 kg/(m^2).  Wt Readings from Last 3 Encounters:  10/20/14 132 lb 8 oz (60.102 kg)  10/14/14 126 lb 12.2 oz (57.5 kg)  04/21/14 129 lb 3.2 oz (58.605 kg)    General: Pleasant. He is quite thin. Pale. He is in no acute distress.  HEENT: Normal. Neck:  Supple, no JVD, carotid bruits, or masses noted.  Cardiac: Regular rate and rhythm. Rate is a little slow. Some ectopics noted. No murmurs, rubs, or gallops. No edema.  Respiratory:  Lungs are fairly clear to auscultation bilaterally with normal work of breathing.  GI: Soft and nontender.  MS: No deformity or atrophy. Gait and ROM intact. Skin: Warm and dry. Color is normal.  Neuro:  Strength and sensation are intact and no gross focal deficits noted.  Psych: Alert, appropriate and with normal affect.   LABORATORY DATA:  EKG:  EKG is ordered today. EKG today shows sinus brady with PVC/PAC. Rate of 57.  Lab Results  Component Value Date   WBC 6.3 10/17/2014   HGB 9.9* 10/17/2014   HCT 30.3* 10/17/2014   PLT 216 10/17/2014   GLUCOSE 90 10/15/2014   CHOL 77 10/15/2014   TRIG 54 10/15/2014   HDL 24* 10/15/2014   LDLCALC 42 10/15/2014   ALT 11 10/14/2014   AST 21 10/14/2014   NA 139 10/15/2014   K 3.6 10/15/2014   CL 106 10/15/2014   CREATININE 0.58 10/15/2014   BUN 12 10/15/2014   CO2 29 10/15/2014   TSH 0.269* 10/15/2014   PSA 0.87 05/07/2011   INR 1.21 10/15/2014   HGBA1C 5.9* 10/15/2014    BNP (last 3 results)  Recent Labs  10/14/14 1727 10/15/14 0030  BNP 135.5* 258.2*    ProBNP (last 3 results) No results for input(s): PROBNP in the last 8760 hours.   Other Studies Reviewed Today:    Echocardiogram 10/16/2014 - Left ventricle: The cavity size was mildly dilated. Wall thickness was normal. Systolic function was moderately reduced. The estimated ejection fraction was in the range of 35% to 40%. There is akinesis of the inferoseptal myocardium. Features are consistent with a pseudonormal left ventricular filling pattern, with concomitant abnormal relaxation and increased filling pressure (grade 2 diastolic dysfunction). - Mitral valve: Calcified annulus. There was moderate regurgitation. - Left atrium: The atrium was mildly dilated. - Right  ventricle: The cavity size was moderately dilated. Wall thickness was normal. - Right atrium: The atrium was mildly dilated. - Pulmonary arteries: Systolic pressure was moderately increased. PA  peak pressure: 52 mm Hg (S).          Assessment/Plan: 1. Ischemic CM  2. Chest pain - known CAD with prior PCIs - has refused cardiac cath earlier this week - he is now ready to schedule - this is arranged for next Friday with Dr. Burt Knack - the procedure has been reviewed with him and he is willing to proceed. Will need pre med due to IV contrast allergy noted on past discharge summary although patient denies.   3. SVT versus VT - unclear etiology -  For cath next week to rule out ischemic basis. If stable findings, will need EP to see (this has NOT been arranged).   4. PAF - holding in sinus today.  5. Systolic HF - compensated  6. Significant situational stress/issues  7. HLD - on 2 statins - I have stopped Crestor - he is not going to be able to afford this long term.   Current medicines are reviewed with the patient today.  The patient does not have concerns regarding medicines other than what has been noted above.  The following changes have been made:  See above.  Labs/ tests ordered today include:    Orders Placed This Encounter  Procedures  . Basic metabolic panel  . CBC  . Protime-INR  . APTT  . EKG 12-Lead     Disposition:   Further disposition to follow based on upcoming cath results. Possible EP referral.  Patient is agreeable to this plan and will call if any problems develop in the interim.   Signed: Burtis Junes, RN, ANP-C 10/20/2014 12:07 PM  [facility] [address] [place], [place]  [address] Phone: [phone] Fax: [phone]

## 2014-10-22 LAB — CULTURE, BLOOD (ROUTINE X 2)
Culture: NO GROWTH
Culture: NO GROWTH

## 2014-10-27 ENCOUNTER — Encounter (HOSPITAL_COMMUNITY)
Admission: RE | Disposition: A | Discharge status: Home / Self Care | Payer: Self-pay | Source: Ambulatory Visit | Attending: Cardiovascular Disease

## 2014-10-27 ENCOUNTER — Ambulatory Visit (HOSPITAL_COMMUNITY)
Admission: RE | Admit: 2014-10-27 | Discharge: 2014-10-27 | Disposition: A | Discharge status: Home / Self Care | Payer: Medicare Other | Source: Ambulatory Visit | Attending: Cardiovascular Disease | Admitting: Cardiovascular Disease

## 2014-10-27 ENCOUNTER — Other Ambulatory Visit (HOSPITAL_COMMUNITY): Payer: Medicare Other

## 2014-10-27 ENCOUNTER — Inpatient Hospital Stay (HOSPITAL_COMMUNITY)
Admission: RE | Admit: 2014-10-27 | Discharge: 2014-10-27 | Disposition: A | Discharge status: Home / Self Care | Payer: Medicare Other | Source: Ambulatory Visit | Attending: Vascular Surgery | Admitting: Vascular Surgery

## 2014-10-27 ENCOUNTER — Ambulatory Visit: Payer: Medicare Other | Admitting: Vascular Surgery

## 2014-10-27 ENCOUNTER — Encounter (HOSPITAL_COMMUNITY): Payer: Self-pay | Admitting: Cardiovascular Disease

## 2014-10-27 DIAGNOSIS — Z79899 Other long term (current) drug therapy: Secondary | ICD-10-CM | POA: Diagnosis not present

## 2014-10-27 DIAGNOSIS — I2510 Atherosclerotic heart disease of native coronary artery without angina pectoris: Secondary | ICD-10-CM | POA: Diagnosis not present

## 2014-10-27 DIAGNOSIS — E785 Hyperlipidemia, unspecified: Secondary | ICD-10-CM | POA: Insufficient documentation

## 2014-10-27 DIAGNOSIS — Z7982 Long term (current) use of aspirin: Secondary | ICD-10-CM | POA: Insufficient documentation

## 2014-10-27 DIAGNOSIS — I252 Old myocardial infarction: Secondary | ICD-10-CM | POA: Diagnosis not present

## 2014-10-27 DIAGNOSIS — F17210 Nicotine dependence, cigarettes, uncomplicated: Secondary | ICD-10-CM | POA: Insufficient documentation

## 2014-10-27 DIAGNOSIS — I723 Aneurysm of iliac artery: Secondary | ICD-10-CM

## 2014-10-27 DIAGNOSIS — Z8673 Personal history of transient ischemic attack (TIA), and cerebral infarction without residual deficits: Secondary | ICD-10-CM | POA: Diagnosis not present

## 2014-10-27 DIAGNOSIS — I255 Ischemic cardiomyopathy: Secondary | ICD-10-CM

## 2014-10-27 DIAGNOSIS — J449 Chronic obstructive pulmonary disease, unspecified: Secondary | ICD-10-CM | POA: Diagnosis not present

## 2014-10-27 DIAGNOSIS — I714 Abdominal aortic aneurysm, without rupture, unspecified: Secondary | ICD-10-CM

## 2014-10-27 DIAGNOSIS — R079 Chest pain, unspecified: Secondary | ICD-10-CM | POA: Diagnosis present

## 2014-10-27 DIAGNOSIS — I5020 Unspecified systolic (congestive) heart failure: Secondary | ICD-10-CM | POA: Diagnosis not present

## 2014-10-27 DIAGNOSIS — I472 Ventricular tachycardia, unspecified: Secondary | ICD-10-CM

## 2014-10-27 DIAGNOSIS — I480 Paroxysmal atrial fibrillation: Secondary | ICD-10-CM | POA: Diagnosis not present

## 2014-10-27 DIAGNOSIS — I739 Peripheral vascular disease, unspecified: Secondary | ICD-10-CM | POA: Diagnosis not present

## 2014-10-27 HISTORY — DX: Ischemic cardiomyopathy: I25.5

## 2014-10-27 HISTORY — PX: LEFT HEART CATHETERIZATION WITH CORONARY ANGIOGRAM: SHX5451

## 2014-10-27 SURGERY — LEFT HEART CATHETERIZATION WITH CORONARY ANGIOGRAM
Anesthesia: LOCAL

## 2014-10-27 MED ORDER — SODIUM CHLORIDE 0.9 % IV SOLN
INTRAVENOUS | Status: DC
  Administered 2014-10-27: 09:00:00 via INTRAVENOUS

## 2014-10-27 MED ORDER — DIPHENHYDRAMINE HCL 50 MG/ML IJ SOLN
INTRAMUSCULAR | Status: AC
  Administered 2014-10-27: 25 mg via INTRAVENOUS
  Filled 2014-10-27: qty 1

## 2014-10-27 MED ORDER — METHYLPREDNISOLONE SODIUM SUCC 125 MG IJ SOLR
125.0000 mg | INTRAMUSCULAR | Status: AC
  Administered 2014-10-27: 125 mg via INTRAVENOUS

## 2014-10-27 MED ORDER — FENTANYL CITRATE 0.05 MG/ML IJ SOLN
INTRAMUSCULAR | Status: AC
  Filled 2014-10-27: qty 2

## 2014-10-27 MED ORDER — HEPARIN (PORCINE) IN NACL 2-0.9 UNIT/ML-% IJ SOLN
INTRAMUSCULAR | Status: AC
  Filled 2014-10-27: qty 1000

## 2014-10-27 MED ORDER — DIPHENHYDRAMINE HCL 50 MG/ML IJ SOLN
25.0000 mg | INTRAMUSCULAR | Status: AC
Start: 2014-10-27 — End: 2014-10-27
  Administered 2014-10-27: 25 mg via INTRAVENOUS

## 2014-10-27 MED ORDER — ACETAMINOPHEN 325 MG PO TABS: 650.0000 mg | ORAL_TABLET | ORAL | Status: DC | PRN

## 2014-10-27 MED ORDER — SODIUM CHLORIDE 0.9 % IV SOLN: 250.0000 mL | INTRAVENOUS | Status: DC | PRN

## 2014-10-27 MED ORDER — ASPIRIN 81 MG PO CHEW
81.0000 mg | CHEWABLE_TABLET | ORAL | Status: AC
  Administered 2014-10-27: 81 mg via ORAL

## 2014-10-27 MED ORDER — VERAPAMIL HCL 2.5 MG/ML IV SOLN
INTRAVENOUS | Status: AC
  Filled 2014-10-27: qty 2

## 2014-10-27 MED ORDER — FAMOTIDINE 20 MG PO TABS
ORAL_TABLET | ORAL | Status: AC
  Administered 2014-10-27: 20 mg via ORAL
  Filled 2014-10-27: qty 1

## 2014-10-27 MED ORDER — ONDANSETRON HCL 4 MG/2ML IJ SOLN: 4.0000 mg | Freq: Four times a day (QID) | INTRAMUSCULAR | Status: DC | PRN

## 2014-10-27 MED ORDER — FAMOTIDINE 20 MG PO TABS
20.0000 mg | ORAL_TABLET | ORAL | Status: AC
  Administered 2014-10-27: 20 mg via ORAL

## 2014-10-27 MED ORDER — LIDOCAINE HCL (PF) 1 % IJ SOLN
INTRAMUSCULAR | Status: AC
  Filled 2014-10-27: qty 30

## 2014-10-27 MED ORDER — METHYLPREDNISOLONE SODIUM SUCC 125 MG IJ SOLR
INTRAMUSCULAR | Status: AC
  Administered 2014-10-27: 125 mg via INTRAVENOUS
  Filled 2014-10-27: qty 2

## 2014-10-27 MED ORDER — SODIUM CHLORIDE 0.9 % IJ SOLN: 3.0000 mL | INTRAMUSCULAR | Status: DC | PRN

## 2014-10-27 MED ORDER — SODIUM CHLORIDE 0.9 % IJ SOLN: 3.0000 mL | Freq: Two times a day (BID) | INTRAMUSCULAR | Status: DC

## 2014-10-27 MED ORDER — HEPARIN SODIUM (PORCINE) 1000 UNIT/ML IJ SOLN
INTRAMUSCULAR | Status: AC
Start: 2014-10-27 — End: 2014-10-27
  Filled 2014-10-27: qty 1

## 2014-10-27 MED ORDER — MIDAZOLAM HCL 2 MG/2ML IJ SOLN
INTRAMUSCULAR | Status: AC
Start: 2014-10-27 — End: 2014-10-27
  Filled 2014-10-27: qty 2

## 2014-10-27 MED ORDER — SODIUM CHLORIDE 0.9 % IV SOLN: 1.0000 mL/kg/h | INTRAVENOUS | Status: DC

## 2014-10-27 MED ORDER — ASPIRIN 81 MG PO CHEW
CHEWABLE_TABLET | ORAL | Status: AC
  Administered 2014-10-27: 81 mg via ORAL
  Filled 2014-10-27: qty 1

## 2014-10-27 MED ORDER — NITROGLYCERIN 1 MG/10 ML FOR IR/CATH LAB
INTRA_ARTERIAL | Status: AC
  Filled 2014-10-27: qty 10

## 2014-10-27 NOTE — H&P (View-Only) (Signed)
CARDIOLOGY OFFICE NOTE  Date:  10/20/2014    [name] Date of Birth: April 19, 1946 Medical Record #[id number]  PCP:  [name]  Cardiologist:  Nahser  Chief Complaint  Patient presents with  . Chest Pain    Post hospital/TOC visit - seen for Dr. [name]     History of Present Illness: [name] is a 69 y.o. male who presents today for a TOC/post hospital visit.   Note: Patient was discharged on 10/17/2014 [facility] AFTER REFUSING CARDIAC CATHETERIZATION AND FURTHER EP Eval  [name] has a history of PVD, HLD, COPD, CVA, CAD s/p multiple PCI's (to the RCA and to the LCX) with mild systolic dysfunction of 81-44% who presented to [facility] on 10/14/2014 with chest pain. En route to the hospital, EMS noted him to be in possible ventricular tachycardia and shocked him out of the rhythm with apparent conversion to atrial fibrillation. Post shock EKG demonstrated AF with anterior ST depression. On arrival to ER, he was hemodynamically stable and in A. fib. Heart rate was in the 120s. Blood pressure 95/67. Lactate acid 2.26. His metoprolol was increased from 12.5 twice a day to 25 mg every 8 hours with eventual conversion to NSR. Overnight cardiology fellow reviewed the initial strip which felt the patient may had SVT due to narrow complex rhythm. Overnight his troponin went up to 0.47 before trending down. It was felt the elevated troponin was likely demand ischemia in the setting of possible SVT and underlying pulmonary disease. However he was felt to need further ischemic eval with left heart cath for further delineation of coronary anatomy due to onset of significant symptomatic arrhythmia. He was continued on IV heparin with plan to change to NOAC after initial workup is complete. Pulmonary service was consulted on 10/15/2014 for chronic productive cough. He was placed on Spiriva and prophylactic antibiotic with Augmentin. Echocardiogram was obtained on 2/80/2016  which showed EF 35-40%, akinesis of the inferior myocardium, grade 2 diastolic dysfunction, PA peak pressure 52 mmHg.   After discussing with the electrophysiology service, original plan was for the patient to undergo cardiac catheterization on 10/17/2014 to assess for underlying coronary disease. It is unclear at this time whether or not the patient truly had SVT versus ventricular tachycardia. The fact that patient converted after shock in the setting of his decreased EF is concerning that this patient may have had ventricular tachycardia on admission. Electrophysiology was consulted to see the patient after today's cath. However, patient refused to sign the consent for cardiac catheterization and insist on leaving AMA. Long discussion ensued with the patient regarding possibility of out of the hospital cardiac arrest. He stated he had to go home and fix his house before he loses his house. Patient also has been dealing with loss of his wife and his dog in recent weeks and likely has some degree of depression as well on top of the financial difficulty. Apparently, the reason he wished to go home at this time is because there was a dead rodent stuck inside a pipe of his house and due to the freezing temperature, his pipe will burst. The concern of further financial loss is driving him to go home at this time despite multiple attempts at discussing with him regarding the potential risk of going out of the hospital without further medical evaluation.  There has also been the discussion about need for possible systemic anticoagulation. It appears that A. fib only occurred in the setting  of post conversion from possible ventricular arrhythmia. His atrial fibrillation has not recurred since. He was held off on adding systemic anticoagulation medication at this time and was discharged on aspirin for now. Beta blocker was increased to 50 mg twice a day.   He comes in here today. He is here alone. Very sad situation.  Has only been home a few days. Brought all of his medicine bottles except for Lisinopril and Spiriva. On 2 statins - Crestor and & Lipitor. He is not sure why. Says he has done ok since discharge with no issues. No more chest pain. Breathing is short but this is chronic. Has had no palpitations. No syncope. Not dizzy. Had 2 busted pipes at home. Now ready to proceed with cardiac cath. He is not sure if he wants to proceed with ICD implant if needed. He does have some nicotine patches and is wanting to quit smoking. He tells me that he does not have a dye allergy - but this has been noted on past discharge summary to pretreat. He tells me that he has a total of 3 stents - last one done around 2007 while he was in [place]. He is on just low dose aspirin.   Past Medical History  Diagnosis Date  . Coronary artery disease     Multivessel, S/p stenting to LCx  . Tobacco dependence   . Ventricular dysfunction     Mild Left  . Dyslipidemia   . Atrial fibrillation      Brief episode of  . Myocardial infarction   . COPD (chronic obstructive pulmonary disease)   . Peripheral vascular disease   . Stroke     Past Surgical History  Procedure Laterality Date  . US echocardiography  12-26-2008    EF 40-45%  . Back surgery      cervical  . Stent      pt unsure of location     Medications: Current Outpatient Prescriptions  Medication Sig Dispense Refill  . albuterol (PROAIR HFA) 108 (90 BASE) MCG/ACT inhaler Inhale into the lungs as directed.     [name] amoxicillin-clavulanate (AUGMENTIN) 875-125 MG per tablet Take 1 tablet by mouth every 12 (twelve) hours. 24 tablet 0  . aspirin EC 81 MG tablet Take 81 mg by mouth daily.    [name] atorvastatin (LIPITOR) 80 MG tablet Take 1 tablet (80 mg total) by mouth daily at 6 PM. 30 tablet 5  . escitalopram (LEXAPRO) 20 MG tablet Take 1 & 1/2 tablets by mouth daily    . HYDROcodone-acetaminophen (VICODIN) 5-500 MG per tablet Take 1 tablet by mouth as needed for pain.       . metoprolol tartrate (LOPRESSOR) 25 MG tablet Take 2 tablets (50 mg total) by mouth 2 (two) times daily. 30 tablet 10  . mometasone (NASONEX) 50 MCG/ACT nasal spray Place 2 sprays into the nose daily. 17 g 2  . nicotine (NICODERM CQ - DOSED IN MG/24 HOURS) 21 mg/24hr patch Place 1 patch (21 mg total) onto the skin daily. 28 patch 0  . nitroGLYCERIN (NITROSTAT) 0.4 MG SL tablet Place 0.4 mg under the tongue every 5 (five) minutes as needed.      [name] lisinopril (PRINIVIL,ZESTRIL) 5 MG tablet take 1 tablet by mouth once daily (Patient not taking: Reported on 10/20/2014) 90 tablet 3  . tiotropium (SPIRIVA) 18 MCG inhalation capsule Place 1 capsule (18 mcg total) into inhaler and inhale daily. (Patient not taking: Reported on 10/20/2014) 30 capsule 3  No current facility-administered medications for this visit.    Allergies: Allergies  Allergen Reactions  . Aspirin Nausea Only and Other (See Comments)    Stomach upset  . Ibuprofen Hives    Social History: The patient  reports that he has been smoking Cigarettes.  He has a 25 pack-year smoking history. He has never used smokeless tobacco. He reports that he does not drink alcohol or use illicit drugs.   Family History: The patient's family history includes AAA (abdominal aortic aneurysm) in his mother; Cancer in his father.   Review of Systems: Please see the history of present illness.   Otherwise, the review of systems is positive for cough, DOE, back pain, muscle pain, wheezing and headaches.   All other systems are reviewed and negative.   Physical Exam: VS:  BP 122/62 mmHg  Pulse 58  Ht 5\' 6"  (1.676 m)  Wt 132 lb 8 oz (60.102 kg)  BMI 21.40 kg/m2 .  BMI Body mass index is 21.4 kg/(m^2).  Wt Readings from Last 3 Encounters:  10/20/14 132 lb 8 oz (60.102 kg)  10/14/14 126 lb 12.2 oz (57.5 kg)  04/21/14 129 lb 3.2 oz (58.605 kg)    General: Pleasant. He is quite thin. Pale. He is in no acute distress.  HEENT: Normal. Neck:  Supple, no JVD, carotid bruits, or masses noted.  Cardiac: Regular rate and rhythm. Rate is a little slow. Some ectopics noted. No murmurs, rubs, or gallops. No edema.  Respiratory:  Lungs are fairly clear to auscultation bilaterally with normal work of breathing.  GI: Soft and nontender.  MS: No deformity or atrophy. Gait and ROM intact. Skin: Warm and dry. Color is normal.  Neuro:  Strength and sensation are intact and no gross focal deficits noted.  Psych: Alert, appropriate and with normal affect.   LABORATORY DATA:  EKG:  EKG is ordered today. EKG today shows sinus brady with PVC/PAC. Rate of 57.  Lab Results  Component Value Date   WBC 6.3 10/17/2014   HGB 9.9* 10/17/2014   HCT 30.3* 10/17/2014   PLT 216 10/17/2014   GLUCOSE 90 10/15/2014   CHOL 77 10/15/2014   TRIG 54 10/15/2014   HDL 24* 10/15/2014   LDLCALC 42 10/15/2014   ALT 11 10/14/2014   AST 21 10/14/2014   NA 139 10/15/2014   K 3.6 10/15/2014   CL 106 10/15/2014   CREATININE 0.58 10/15/2014   BUN 12 10/15/2014   CO2 29 10/15/2014   TSH 0.269* 10/15/2014   PSA 0.87 05/07/2011   INR 1.21 10/15/2014   HGBA1C 5.9* 10/15/2014    BNP (last 3 results)  Recent Labs  10/14/14 1727 10/15/14 0030  BNP 135.5* 258.2*    ProBNP (last 3 results) No results for input(s): PROBNP in the last 8760 hours.   Other Studies Reviewed Today:    Echocardiogram 10/16/2014 - Left ventricle: The cavity size was mildly dilated. Wall thickness was normal. Systolic function was moderately reduced. The estimated ejection fraction was in the range of 35% to 40%. There is akinesis of the inferoseptal myocardium. Features are consistent with a pseudonormal left ventricular filling pattern, with concomitant abnormal relaxation and increased filling pressure (grade 2 diastolic dysfunction). - Mitral valve: Calcified annulus. There was moderate regurgitation. - Left atrium: The atrium was mildly dilated. - Right  ventricle: The cavity size was moderately dilated. Wall thickness was normal. - Right atrium: The atrium was mildly dilated. - Pulmonary arteries: Systolic pressure was moderately increased. PA  peak pressure: 52 mm Hg (S).          Assessment/Plan: 1. Ischemic CM  2. Chest pain - known CAD with prior PCIs - has refused cardiac cath earlier this week - he is now ready to schedule - this is arranged for next Friday with Dr. Burt Knack - the procedure has been reviewed with him and he is willing to proceed. Will need pre med due to IV contrast allergy noted on past discharge summary although patient denies.   3. SVT versus VT - unclear etiology -  For cath next week to rule out ischemic basis. If stable findings, will need EP to see (this has NOT been arranged).   4. PAF - holding in sinus today.  5. Systolic HF - compensated  6. Significant situational stress/issues  7. HLD - on 2 statins - I have stopped Crestor - he is not going to be able to afford this long term.   Current medicines are reviewed with the patient today.  The patient does not have concerns regarding medicines other than what has been noted above.  The following changes have been made:  See above.  Labs/ tests ordered today include:    Orders Placed This Encounter  Procedures  . Basic metabolic panel  . CBC  . Protime-INR  . APTT  . EKG 12-Lead     Disposition:   Further disposition to follow based on upcoming cath results. Possible EP referral.  Patient is agreeable to this plan and will call if any problems develop in the interim.   Signed: Burtis Junes, RN, ANP-C 10/20/2014 12:07 PM  [facility] [address] [place], [place]  [address] Phone: [phone] Fax: [phone]

## 2014-10-27 NOTE — CV Procedure (Signed)
    Cardiac Catheterization Procedure Note  Name: [name] MRN: [id number] DOB: 1946-01-30  Procedure: Left Heart Cath, Selective Coronary Angiography, LV angiography  Indication: ischemic cardiomyopathy, troponin elevation in setting of AF with RVR.   69 year old gentleman who has extensive coronary disease. He has undergone remote PCI the right coronary artery and left circumflex. Both vessels are totally occluded from cardiac catheterization back in 2009. There are extensive collaterals on the previous study. He is referred for repeat cardiac catheterization.   Procedural Details: The right wrist was prepped, draped, and anesthetized with 1% lidocaine. Using the modified Seldinger technique, a 5/6 French Slender sheath was introduced into the right radial artery. 3 mg of verapamil was administered through the sheath, weight-based unfractionated heparin was administered intravenously. Standard Judkins catheters were used for selective coronary angiography and left ventriculography. Catheter exchanges were performed over an exchange length guidewire. There were no immediate procedural complications. A TR band was used for radial hemostasis at the completion of the procedure.  The patient was transferred to the post catheterization recovery area for further monitoring.  Procedural Findings: Hemodynamics: AO 112/59 with a mean of 80 LV 115/6  Coronary angiography: Coronary dominance: right  Left mainstem: The left main is patent with no obstruction. The vessel divides into the LAD and left circumflex. There is mild left main calcification appreciated.  Left anterior descending (LAD): The LAD supplies an extensive amount of myocardium. The vessel is patent to the apex and it wraps around the LV apex. There is moderate diffuse calcification. The proximal LAD has mild diffuse nonobstructive stenosis of 20-30%. The mid LAD in an area of angulation has 40-50% stenosis. There is moderate  calcification in this region. The first diagonal is fairly large in caliber. That vessel has an 80% stenosis in its proximal aspect. This is progressed from the previous study.  Left circumflex (LCx): The left circumflex is totally occluded in its proximal segment. The vessel is collateralized from the RCA and the collateral supply a single obtuse marginal branch.  Right coronary artery (RCA): The RCA is totally occluded proximally. The vessel has a stent in the occlusion is just before the stented segment. There are extensive collaterals from the septal perforators of the LAD supply in the distal RCA and its branches.  Left ventriculography: There is severe LV systolic dysfunction. The anterolateral wall is hypokinetic. The apex is hypokinetic. The mid inferior wall and inferoapex are severely hypokinetic. The estimated LVEF is 35%.  Estimated Blood Loss: Minimal  Final Conclusions:   1. 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 Mild to moderate nonobstructive LAD stenosis with progressive branch vessel stenosis involving the first diagonal 3. Severe segmental LV systolic dysfunction with an LVEF estimated at 35%  Recommendations: The patient is not experiencing symptoms of exertional angina. He has an ischemic cardiomyopathy. He has done very poorly with stents in the past and has occluded both segments where stents were remotely placed. I would recommend treating him with medical therapy. As per review of hospital records, he should undergo electrophysiology evaluation. There was a question of ventricular tachycardia and atrial fibrillation from previous hospitalization.   Sherren Mocha MD, [facility] 10/27/2014, 10:38 AM

## 2014-10-27 NOTE — Discharge Instructions (Signed)
Radial Site Care °Refer to this sheet in the next few weeks. These instructions provide you with information on caring for yourself after your procedure. Your caregiver may also give you more specific instructions. Your treatment has been planned according to current medical practices, but problems sometimes occur. Call your caregiver if you have any problems or questions after your procedure. °HOME CARE INSTRUCTIONS °· You may shower the day after the procedure. Remove the bandage (dressing) and gently wash the site with plain soap and water. Gently pat the site dry. °· Do not apply powder or lotion to the site. °· Do not submerge the affected site in water for 3 to 5 days. °· Inspect the site at least twice daily. °· Do not flex or bend the affected arm for 24 hours. °· No lifting over 5 pounds (2.3 kg) for 5 days after your procedure. °· Do not drive home if you are discharged the same day of the procedure. Have someone else drive you. °· You may drive 24 hours after the procedure unless otherwise instructed by your caregiver. °· Do not operate machinery or power tools for 24 hours. °· A responsible adult should be with you for the first 24 hours after you arrive home. °What to expect: °· Any bruising will usually fade within 1 to 2 weeks. °· Blood that collects in the tissue (hematoma) may be painful to the touch. It should usually decrease in size and tenderness within 1 to 2 weeks. °SEEK IMMEDIATE MEDICAL CARE IF: °· You have unusual pain at the radial site. °· You have redness, warmth, swelling, or pain at the radial site. °· You have drainage (other than a small amount of blood on the dressing). °· You have chills. °· You have a fever or persistent symptoms for more than 72 hours. °· You have a fever and your symptoms suddenly get worse. °· Your arm becomes pale, cool, tingly, or numb. °· You have heavy bleeding from the site. Hold pressure on the site. °Document Released: 09/27/2010 Document Revised:  11/17/2011 Document Reviewed: 09/27/2010 °ExitCare® Patient Information ©2015 [employer]. This information is not intended to replace advice given to you by your health care provider. Make sure you discuss any questions you have with your health care provider. ° °

## 2014-10-27 NOTE — Interval H&P Note (Signed)
History and Physical Interval Note:  10/27/2014 10:02 AM  [name]  has presented today for surgery, with the diagnosis of v-tach  The various methods of treatment have been discussed with the patient and family. After consideration of risks, benefits and other options for treatment, the patient has consented to  Procedure(s): LEFT HEART CATHETERIZATION WITH CORONARY ANGIOGRAM (N/A) as a surgical intervention .  The patient's history has been reviewed, patient examined, no change in status, stable for surgery.  I have reviewed the patient's chart and labs.  Questions were answered to the patient's satisfaction.    Cath Lab Visit (complete for each Cath Lab visit)  Clinical Evaluation Leading to the Procedure:   ACS: No.  Non-ACS:    Anginal Classification: CCS II  Anti-ischemic medical therapy: Minimal Therapy (1 class of medications)  Non-Invasive Test Results: No non-invasive testing performed  Prior CABG: No previous CABG       Sherren Mocha

## 2014-11-06 LAB — FUNGUS CULTURE W SMEAR: FUNGAL SMEAR: NONE SEEN

## 2014-11-08 ENCOUNTER — Encounter: Payer: Self-pay | Admitting: Internal Medicine

## 2014-11-08 ENCOUNTER — Ambulatory Visit (INDEPENDENT_AMBULATORY_CARE_PROVIDER_SITE_OTHER): Payer: Medicare Other | Admitting: Internal Medicine

## 2014-11-08 ENCOUNTER — Ambulatory Visit (INDEPENDENT_AMBULATORY_CARE_PROVIDER_SITE_OTHER)
Admission: RE | Admit: 2014-11-08 | Discharge: 2014-11-08 | Disposition: A | Discharge status: Home / Self Care | Payer: Medicare Other | Source: Ambulatory Visit | Attending: Internal Medicine | Admitting: Internal Medicine

## 2014-11-08 VITALS — BP 122/66 | HR 44 | Ht 66.0 in | Wt 128.4 lb

## 2014-11-08 DIAGNOSIS — J42 Unspecified chronic bronchitis: Secondary | ICD-10-CM

## 2014-11-08 DIAGNOSIS — I255 Ischemic cardiomyopathy: Secondary | ICD-10-CM | POA: Diagnosis not present

## 2014-11-08 DIAGNOSIS — Z720 Tobacco use: Secondary | ICD-10-CM | POA: Diagnosis not present

## 2014-11-08 DIAGNOSIS — J471 Bronchiectasis with (acute) exacerbation: Secondary | ICD-10-CM

## 2014-11-08 DIAGNOSIS — J449 Chronic obstructive pulmonary disease, unspecified: Secondary | ICD-10-CM | POA: Diagnosis not present

## 2014-11-08 MED ORDER — UMECLIDINIUM BROMIDE 62.5 MCG/INH IN AEPB: 62.5000 ug | INHALATION_SPRAY | Freq: Every day | RESPIRATORY_TRACT | Status: DC

## 2014-11-08 NOTE — Patient Instructions (Addendum)
Sample x 2 Incruse    1 puff, once daily   Order- CXR  Dx Chronic bronchitis  Finish the last of your augmentin antibiotic, then we will watch to see how the cough and phlegm do.  Please do every thing you can to stop smoking, before your heart and lungs give out.  Order- schedule PFT    Dx chronic bronchitis

## 2014-11-08 NOTE — Progress Notes (Signed)
11/08/14- 89 yoM smoker establishing for COPD mixed type Hosp f/u- recent admission 10/14/14 for STEMI/ AFIB/ cardioversion complicated by chronic bronchitis with productive cough x 6 months, then VTACHvsSVT. RLL pneumonia/ effusion- no culture result LHeart cath- 10/27/14- ischemic cardiomyopathy ECHO 10/16/14- EF 35-40%, moderate PHTN 52 syst. Now down to 7 cigs/ day CXR 10/16/14 IMPRESSION: Slight worsening right lower lobe infiltrate with probable small parapneumonic effusion. Electronically Signed  By: Kalman Jewels M.D.  On: 10/16/2014 15:00  Prior to Admission medications   Medication Sig Start Date End Date Taking? Authorizing Provider  albuterol (PROAIR HFA) 108 (90 BASE) MCG/ACT inhaler Inhale 1-2 puffs into the lungs every 6 (six) hours as needed for wheezing or shortness of breath.  02/23/14 02/23/15 Yes Historical Provider, MD  amoxicillin-clavulanate (AUGMENTIN) 875-125 MG per tablet Take 1 tablet by mouth every 12 (twelve) hours. 10/17/14  Yes Almyra Deforest, PA  aspirin EC 81 MG tablet Take 81 mg by mouth daily.   Yes Historical Provider, MD  atorvastatin (LIPITOR) 80 MG tablet Take 1 tablet (80 mg total) by mouth daily at 6 PM. 10/17/14  Yes Almyra Deforest, PA  escitalopram (LEXAPRO) 20 MG tablet Take 30 mg by mouth daily.    Yes Historical Provider, MD  HYDROcodone-acetaminophen (NORCO/VICODIN) 5-325 MG per tablet Take 1 tablet by mouth every 6 (six) hours as needed for moderate pain.   Yes Historical Provider, MD  lisinopril (PRINIVIL,ZESTRIL) 5 MG tablet take 1 tablet by mouth once daily 12/06/12  Yes Thayer Headings, MD  metoprolol tartrate (LOPRESSOR) 25 MG tablet Take 2 tablets (50 mg total) by mouth 2 (two) times daily. 10/17/14  Yes Almyra Deforest, PA  mometasone (NASONEX) 50 MCG/ACT nasal spray Place 2 sprays into the nose daily. 10/17/14 04/26/15 Yes Almyra Deforest, PA  nicotine (NICODERM CQ - DOSED IN MG/24 HOURS) 21 mg/24hr patch Place 1 patch (21 mg total) onto the skin daily. 10/17/14  Yes Almyra Deforest, PA   nitroGLYCERIN (NITROSTAT) 0.4 MG SL tablet Place 0.4 mg under the tongue every 5 (five) minutes as needed for chest pain.    Yes Historical Provider, MD  tiotropium (SPIRIVA) 18 MCG inhalation capsule Place 1 capsule (18 mcg total) into inhaler and inhale daily. 10/17/14  Yes Almyra Deforest, PA  Umeclidinium Bromide (INCRUSE ELLIPTA) 62.5 MCG/INH AEPB Inhale 62.5 mcg into the lungs daily. 11/08/14   Deneise Lever, MD   Past Medical History  Diagnosis Date  . Coronary artery disease     Multivessel, S/p stenting to LCx  . Tobacco dependence   . Ventricular dysfunction     Mild Left  . Dyslipidemia   . Atrial fibrillation      Brief episode of  . Myocardial infarction   . COPD (chronic obstructive pulmonary disease)   . Peripheral vascular disease   . Stroke   . Cardiomyopathy, ischemic 10/27/2014   Past Surgical History  Procedure Laterality Date  . US echocardiography  12-26-2008    EF 40-45%  . Back surgery      cervical  . Stent      pt unsure of location  . Left heart catheterization with coronary angiogram N/A 10/27/2014    Procedure: LEFT HEART CATHETERIZATION WITH CORONARY ANGIOGRAM;  Surgeon: Blane Ohara, MD;  Location: [facility] CATH LAB;  Service: Cardiovascular;  Laterality: N/A;   Family History  Problem Relation Age of Onset  . AAA (abdominal aortic aneurysm) Mother   . Cancer Father     bone marrow   History  Social History  . Marital Status: Widowed    Spouse Name: N/A  . Number of Children: 1  . Years of Education: N/A   Occupational History  . Not on file.   Social History Main Topics  . Smoking status: Current Every Day Smoker -- 0.50 packs/day for 50 years    Types: Cigarettes  . Smokeless tobacco: Never Used  . Alcohol Use: No  . Drug Use: No  . Sexual Activity: Not on file   Other Topics Concern  . Not on file   Social History Narrative   ROS-see HPI   Negative unless "+" Constitutional:    weight loss, night sweats, fevers, chills, fatigue,  lassitude. HEENT:    headaches, difficulty swallowing, tooth/dental problems, sore throat,       sneezing, itching, ear ache, nasal congestion, post nasal drip, snoring CV:    chest pain, orthopnea, PND, swelling in lower extremities, anasarca,                                  dizziness, palpitations Resp:   +shortness of breath with exertion or at rest.                +productive cough,   non-productive cough, coughing up of blood.              change in color of mucus.  wheezing.   Skin:    rash or lesions. GI:  No-   heartburn, indigestion, abdominal pain, nausea, vomiting, diarrhea,                 change in bowel habits, loss of appetite GU: dysuria, change in color of urine, no urgency or frequency.   flank pain. MS:   joint pain, stiffness, decreased range of motion, back pain. Neuro-     nothing unusual Psych:  change in mood or affect.  depression or anxiety.   memory loss.  OBJ- Physical Exam General- Alert, Oriented, Affect-appropriate, Distress- none acute, + thin Skin- rash-none, lesions- none, excoriation- none Lymphadenopathy- none Head- atraumatic            Eyes- Gross vision intact, PERRLA, conjunctivae and secretions clear            Ears- Hearing, canals-normal            Nose- Clear, no-Septal dev, mucus, polyps, erosion, perforation             Throat- Mallampati II , mucosa clear , drainage- none, tonsils- atrophic Neck- flexible , trachea midline, no stridor , thyroid nl, carotid no bruit Chest - symmetrical excursion , unlabored           Heart/CV- RRR , no murmur , no gallop  , no rub, nl s1 s2                           - JVD- none , edema- none, stasis changes- none, varices- none           Lung- +coarse, wheeze- none, cough+loose , dullness-none, rub- none           Chest wall-  Abd-  Br/ Gen/ Rectal- Not done, not indicated Extrem- cyanosis- none, clubbing, none, atrophy- none, strength- nl Neuro- grossly intact to observation

## 2014-11-15 ENCOUNTER — Institutional Professional Consult (permissible substitution): Payer: Medicare Other | Admitting: Internal Medicine

## 2014-11-16 ENCOUNTER — Encounter: Payer: Self-pay | Admitting: Internal Medicine

## 2014-12-05 NOTE — Assessment & Plan Note (Signed)
As he stabilizes we will need PFT and assessment of response to bronchodilator

## 2014-12-05 NOTE — Assessment & Plan Note (Signed)
Significantly limited by his heart disease. Dyspnea with exertion will reflect both heart and lung disease. Management by cardiology.

## 2014-12-05 NOTE — Assessment & Plan Note (Signed)
Emphasis on smoking cessation counseling and support

## 2014-12-05 NOTE — Assessment & Plan Note (Signed)
He describes purulent sputum persistent over 6 months. 3 strong likelihood there is bronchiectatic change. Plan-chest x-ray, sample Spiriva, finish Augmentin

## 2014-12-28 ENCOUNTER — Encounter: Payer: Self-pay | Admitting: Vascular Surgery

## 2014-12-29 ENCOUNTER — Other Ambulatory Visit (HOSPITAL_COMMUNITY): Payer: Medicare Other

## 2014-12-29 ENCOUNTER — Ambulatory Visit: Payer: Medicare Other | Admitting: Vascular Surgery

## 2015-01-08 ENCOUNTER — Encounter: Payer: Self-pay | Admitting: Internal Medicine

## 2015-01-08 ENCOUNTER — Ambulatory Visit (INDEPENDENT_AMBULATORY_CARE_PROVIDER_SITE_OTHER): Payer: Medicare Other | Admitting: Internal Medicine

## 2015-01-08 VITALS — BP 102/80 | HR 50 | Ht 66.0 in | Wt 131.2 lb

## 2015-01-08 DIAGNOSIS — I480 Paroxysmal atrial fibrillation: Secondary | ICD-10-CM

## 2015-01-08 DIAGNOSIS — I472 Ventricular tachycardia, unspecified: Secondary | ICD-10-CM

## 2015-01-08 DIAGNOSIS — I255 Ischemic cardiomyopathy: Secondary | ICD-10-CM

## 2015-01-08 DIAGNOSIS — I714 Abdominal aortic aneurysm, without rupture, unspecified: Secondary | ICD-10-CM

## 2015-01-08 NOTE — Progress Notes (Signed)
ELECTROPHYSIOLOGY CONSULT NOTE  Patient ID: [name], MRN: [id number], DOB/AGE: September 09, 1945 69 y.o. Admit date: (Not on file) Date of Consult: 01/08/2015  Primary Physician: Sheral Apley Primary Cardiologist: NEW  Chief Complaint:  SYNCOPE AND WIDE COMPLEX tachycardia   HPI [name] is a 69 y.o. male  seen with a history of atrial fibrillation and wide complex tachycardia.  This was identified when he called EMS because of N+V and LH. On their arrival they found him to be in [facility]  And he underwent DCCV >>Afib.    Afib thought 2/2 to [facility] and so anticoagulation   He has a history of ischemic heart disease with prior stenting of his RCA and circumflex.   2/16  LHC >>Final Conclusions:  1. 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 Mild to moderate nonobstructive LAD stenosis with progressive branch vessel stenosis involving the first diagonal 3. Severe segmental LV systolic dysfunction with an LVEF estimated at 35%  Echocardiogram also showed akinesis of the inferior wall.  He has some DOE and infreq CP    NO other syncope  No palpitations    He tells me that he is under significant stress   His son lives with him and his granddaughter doesn't come around any more  His wife died in 29-Sep-2022   He is to distrubute her ashes over the graves of his three baby daughters who died many years ago     Past Medical History  Diagnosis Date  . Coronary artery disease     Multivessel, S/p stenting to LCx  . Tobacco dependence   . Ventricular dysfunction     Mild Left  . Dyslipidemia   . Atrial fibrillation      Brief episode of  . Myocardial infarction   . COPD (chronic obstructive pulmonary disease)   . Peripheral vascular disease   . Stroke   . Cardiomyopathy, ischemic 10/27/2014      Surgical History:  Past Surgical History  Procedure Laterality Date  . US echocardiography  12-26-2008    EF 40-45%  .  Back surgery      cervical  . Stent      pt unsure of location  . Left heart catheterization with coronary angiogram N/A 10/27/2014    Procedure: LEFT HEART CATHETERIZATION WITH CORONARY ANGIOGRAM;  Surgeon: Blane Ohara, MD;  Location: [facility] CATH LAB;  Service: Cardiovascular;  Laterality: N/A;     Home Meds: Prior to Admission medications   Medication Sig Start Date End Date Taking? Authorizing Provider  albuterol (PROAIR HFA) 108 (90 BASE) MCG/ACT inhaler Inhale 1-2 puffs into the lungs every 6 (six) hours as needed for wheezing or shortness of breath.  02/23/14 02/23/15 Yes Historical Provider, MD  amoxicillin-clavulanate (AUGMENTIN) 875-125 MG per tablet Take 1 tablet by mouth every 12 (twelve) hours. 10/17/14  Yes Almyra Deforest, PA  aspirin EC 81 MG tablet Take 81 mg by mouth daily.   Yes Historical Provider, MD  atorvastatin (LIPITOR) 80 MG tablet Take 1 tablet (80 mg total) by mouth daily at 6 PM. 10/17/14  Yes Almyra Deforest, PA  escitalopram (LEXAPRO) 20 MG tablet Take 30 mg by mouth daily.    Yes Historical Provider, MD  HYDROcodone-acetaminophen (NORCO/VICODIN) 5-325 MG per tablet Take 1 tablet by mouth every 6 (six) hours as needed for moderate pain.   Yes Historical Provider, MD  lisinopril (PRINIVIL,ZESTRIL) 5 MG tablet take 1 tablet by mouth once  daily 12/06/12  Yes Thayer Headings, MD  metoprolol tartrate (LOPRESSOR) 25 MG tablet Take 2 tablets (50 mg total) by mouth 2 (two) times daily. 10/17/14  Yes Almyra Deforest, PA  mometasone (NASONEX) 50 MCG/ACT nasal spray Place 2 sprays into the nose daily. 10/17/14 04/26/15 Yes Almyra Deforest, PA  nicotine (NICODERM CQ - DOSED IN MG/24 HOURS) 21 mg/24hr patch Place 1 patch (21 mg total) onto the skin daily. 10/17/14  Yes Almyra Deforest, PA  nitroGLYCERIN (NITROSTAT) 0.4 MG SL tablet Place 0.4 mg under the tongue every 5 (five) minutes as needed for chest pain.    Yes Historical Provider, MD  tiotropium (SPIRIVA) 18 MCG inhalation capsule Place 1 capsule (18 mcg total) into  inhaler and inhale daily. 10/17/14  Yes Almyra Deforest, PA  Umeclidinium Bromide (INCRUSE ELLIPTA) 62.5 MCG/INH AEPB Inhale 62.5 mcg into the lungs daily. 11/08/14  Yes Deneise Lever, MD      Allergies:  Allergies  Allergen Reactions  . Aspirin Nausea Only and Other (See Comments)    Stomach upset  . Ibuprofen Hives    History   Social History  . Marital Status: Widowed    Spouse Name: N/A  . Number of Children: 1  . Years of Education: N/A   Occupational History  . Not on file.   Social History Main Topics  . Smoking status: Current Every Day Smoker -- 0.50 packs/day for 50 years    Types: Cigarettes  . Smokeless tobacco: Never Used  . Alcohol Use: No  . Drug Use: No  . Sexual Activity: Not on file   Other Topics Concern  . Not on file   Social History Narrative     Family History  Problem Relation Age of Onset  . AAA (abdominal aortic aneurysm) Mother   . Cancer Father     bone marrow     ROS:  Please see the history of present illness.     All other systems reviewed and negative.    Physical Exam: Blood pressure 102/80, pulse 50, height '5\' 6"'$  (1.676 m), weight 131 lb 3.2 oz (59.512 kg). General: Well developed, well nourished male in no acute distress. Head: Normocephalic, atraumatic, sclera non-icteric, no xanthomas, nares are without discharge. EENT: normal Lymph Nodes:  none Back: without scoliosis/kyphosis *, no CVA tendersness Neck: Negative for carotid bruits. JVD not elevated. Lungs: Clear bilaterally to auscultation without wheezes, rales, or rhonchi. Breathing is unlabored. Heart: RRR with S1 S2. 2/6 systolic  murmur , rubs, or gallops appreciated. Abdomen: Soft, non-tender, non-distended with normoactive bowel sounds. No hepatomegaly. No rebound/guarding. No obvious abdominal masses. Msk:  Strength and tone appear normal for age. Extremities: No clubbing or cyanosis. N * edema.  Distal pedal pulses are 2+ and equal bilaterally. Skin: Warm and  Dry Neuro: Alert and oriented X 3. CN III-XII intact Grossly normal sensory and motor function . Psych:  Responds to questions appropriately with a normal affect.      Labs: Cardiac Enzymes No results for input(s): CKTOTAL, CKMB, TROPONINI in the last 72 hours. CBC Lab Results  Component Value Date   WBC 6.8 10/20/2014   HGB 10.8* 10/20/2014   HCT 32.7* 10/20/2014   MCV 89.2 10/20/2014   PLT 322.0 10/20/2014   PROTIME: No results for input(s): LABPROT, INR in the last 72 hours. Chemistry No results for input(s): NA, K, CL, CO2, BUN, CREATININE, CALCIUM, PROT, BILITOT, ALKPHOS, ALT, AST, GLUCOSE in the last 168 hours.  Invalid input(s): LABALBU Lipids  Lab Results  Component Value Date   CHOL 77 10/15/2014   HDL 24* 10/15/2014   LDLCALC 42 10/15/2014   TRIG 54 10/15/2014   BNP No results found for: PROBNP Thyroid Function Tests: No results for input(s): TSH, T4TOTAL, T3FREE, THYROIDAB in the last 72 hours.  Invalid input(s): FREET3    Miscellaneous No results found for: DDIMER  Radiology/Studies:  No results found.  EKG:  Sinus rhythm at 50 Intervals 16/08/47 Nonspecific ST-T changes  Assessment and Plan:    Ischemic heart disease with prior stenting and prior inferior wall MI  Ischemic cardiomyopathy ejection fraction 35%  Wide-complex tachycardia   Atrial fibrillation  Syncope   Abdominal aortic aneurysm  Tobacco abuse  He presented February 2016 with syncopal wide-complex tachycardia that following cardioversion with atrial fibrillation. I strongly suspect that this was ventricular tachycardia particularly occurring in the context of ischemic heart disease and prior wall motion abnormalities. It is unlikely that SVT would be converted to atrial fibrillation.  Hence, it is appropriate for secondary prevention to recommend ICD implantation. Given his history of bradycardia, would anticipate use of a dual-chamber device. This would also help to  eliminate the issue of atrial fibrillation as to whether is a recurring thing following his cardioversion  At this point he is having no ischemia. Functional status is stable and largely related to his lungs.   He is planning at the end of May take his wife's actions guiding January distributed over the graves  of his daughters who died 73 years ago.  We discussed smoking. He will begin patches of lozenges.   we will repeat his abdominal ultrasound to reassess his abdominal aortic aneurysm previously identified        Virl Axe

## 2015-01-08 NOTE — Patient Instructions (Signed)
Medication Instructions:  Your physician recommends that you continue on your current medications as directed. Please refer to the Current Medication list given to you today.  Labwork: None  Testing/Procedures: Your physician has requested that you have an abdominal ultrasound when you come back to see Dr. Caryl Comes on June 7th.   Follow-Up: Your physician recommends that you schedule a follow-up appointment on: June 7th at 1:30 with Dr. Caryl Comes.   Thank you for choosing [facility]!!

## 2015-01-09 ENCOUNTER — Ambulatory Visit: Payer: Medicare Other | Admitting: Internal Medicine

## 2015-01-31 ENCOUNTER — Encounter: Payer: Self-pay | Admitting: Vascular Surgery

## 2015-02-02 ENCOUNTER — Encounter (HOSPITAL_COMMUNITY): Payer: Medicare Other

## 2015-02-02 ENCOUNTER — Ambulatory Visit: Payer: Medicare Other | Admitting: Vascular Surgery

## 2015-02-13 ENCOUNTER — Ambulatory Visit: Payer: Medicare Other | Admitting: Internal Medicine

## 2015-03-21 ENCOUNTER — Encounter: Payer: Self-pay | Admitting: Vascular Surgery

## 2015-03-23 ENCOUNTER — Encounter: Payer: Self-pay | Admitting: Vascular Surgery

## 2015-03-23 ENCOUNTER — Ambulatory Visit (INDEPENDENT_AMBULATORY_CARE_PROVIDER_SITE_OTHER): Payer: Medicare Other | Admitting: Vascular Surgery

## 2015-03-23 ENCOUNTER — Ambulatory Visit (HOSPITAL_COMMUNITY)
Admission: RE | Admit: 2015-03-23 | Discharge: 2015-03-23 | Disposition: A | Discharge status: Home / Self Care | Payer: Medicare Other | Source: Ambulatory Visit | Attending: Vascular Surgery | Admitting: Vascular Surgery

## 2015-03-23 ENCOUNTER — Other Ambulatory Visit: Payer: Self-pay | Admitting: Vascular Surgery

## 2015-03-23 VITALS — BP 108/75 | HR 45 | Ht 66.0 in | Wt 123.2 lb

## 2015-03-23 DIAGNOSIS — I722 Aneurysm of renal artery: Secondary | ICD-10-CM

## 2015-03-23 DIAGNOSIS — I723 Aneurysm of iliac artery: Secondary | ICD-10-CM

## 2015-03-23 DIAGNOSIS — I7773 Dissection of renal artery: Secondary | ICD-10-CM

## 2015-03-23 DIAGNOSIS — I714 Abdominal aortic aneurysm, without rupture, unspecified: Secondary | ICD-10-CM

## 2015-03-23 DIAGNOSIS — I255 Ischemic cardiomyopathy: Secondary | ICD-10-CM | POA: Diagnosis not present

## 2015-03-23 NOTE — Progress Notes (Signed)
Established Abdominal Aortic Aneurysm  History of Present Illness  The patient is a 69 y.o. (Nov 25, 1945) male who presents with chief complaint: follow up for AAA.  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  The patient does not have back or abdominal pain.  The patient is a smoker: quit previously but restarted due to death of spouse.  The patient's PMH, PSH, SH, and FamHx are unchanged from 04/21/14.  Current Outpatient Prescriptions  Medication Sig Dispense Refill  . amoxicillin-clavulanate (AUGMENTIN) 875-125 MG per tablet Take 1 tablet by mouth every 12 (twelve) hours. 24 tablet 0  . aspirin EC 81 MG tablet Take 81 mg by mouth daily.    [name] atorvastatin (LIPITOR) 80 MG tablet Take 1 tablet (80 mg total) by mouth daily at 6 PM. 30 tablet 5  . escitalopram (LEXAPRO) 20 MG tablet Take 30 mg by mouth daily.     [name] HYDROcodone-acetaminophen (NORCO/VICODIN) 5-325 MG per tablet Take 1 tablet by mouth every 6 (six) hours as needed for moderate pain.    [name] lisinopril (PRINIVIL,ZESTRIL) 5 MG tablet take 1 tablet by mouth once daily 90 tablet 3  . metoprolol tartrate (LOPRESSOR) 25 MG tablet Take 2 tablets (50 mg total) by mouth 2 (two) times daily. 30 tablet 10  . mometasone (NASONEX) 50 MCG/ACT nasal spray Place 2 sprays into the nose daily. 17 g 2  . nicotine (NICODERM CQ - DOSED IN MG/24 HOURS) 21 mg/24hr patch Place 1 patch (21 mg total) onto the skin daily. 28 patch 0  . nitroGLYCERIN (NITROSTAT) 0.4 MG SL tablet Place 0.4 mg under the tongue every 5 (five) minutes as needed for chest pain.     [name] tiotropium (SPIRIVA) 18 MCG inhalation capsule Place 1 capsule (18 mcg total) into inhaler and inhale daily. 30 capsule 3  . Umeclidinium Bromide (INCRUSE ELLIPTA) 62.5 MCG/INH AEPB Inhale 62.5 mcg into the lungs daily. 7 each 0  . albuterol (PROAIR HFA) 108 (90 BASE) MCG/ACT inhaler  Inhale 1-2 puffs into the lungs every 6 (six) hours as needed for wheezing or shortness of breath.      No current facility-administered medications for this visit.    Allergies  Allergen Reactions  . Aspirin Nausea Only and Other (See Comments)    Stomach upset  . Ibuprofen Hives    On ROS today: no change in urinary habits, continued cigarette smoking   Physical Examination  Filed Vitals:   03/23/15 1005  BP: 108/75  Pulse: 45  Height: '5\' 6"'$  (1.676 m)  Weight: 123 lb 3.2 oz (55.883 kg)  SpO2: 100%   Body mass index is 19.89 kg/(m^2).  General: A&O x 3, WD, thin  Pulmonary: Sym exp, good air movt, CTAB, no rales, rhonchi, & wheezing  Cardiac: RRR, Nl S1, S2, no Murmurs, rubs or gallops  Vascular: Vessel Right Left  Radial Palpable Palpable  Brachial Palpable Palpable  Carotid Palpable, without bruit Palpable, without bruit  Aorta Not palpable N/A  Femoral Palpable Palpable  Popliteal Not palpable Not palpable  PT Palpable Palpable  DP Palpable Palpable   Gastrointestinal: soft, NTND, no G/R, no HSM, no masses, no CVAT B  Musculoskeletal: M/S 5/5 throughout , Extremities without ischemic changes ,   Neurologic:  Pain and light touch intact in extremities , Motor exam as listed above  Non-Invasive Vascular Imaging  AAA Duplex (03/23/2015)  Previous size: 3.76 cm (Date: 04/21/14)  Current size:  4.2 cm x 4.2 cm (Date: 03/23/2015)  R CIA: 3.1 cm x 3.3 cm (2.95 cm)  L CIA: 1.4 cm x 1.5 cm (1.96 cm)  R RA: 1.06 cm (0.93 cm)   Medical Decision Making  The patient is a 70 y.o. male who presents with: asymptomatic AAA with increasing size, asx R CIA aneurysm with increasing size, L CIA aneurysm with decreasing size, slightly increasing increasing R RA PSA   Based on this patient's exam and diagnostic studies, he needs repeat AAA duplex in 6 months given increasing sizes.  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  Will continue to observe R RA PSA due to dissection for now as <2.0 cm.  Once it reaches 2.0 cm will refer to [facility] for their expertise with renal artery branch aneurysm.  I emphasized the importance of maximal medical management including strict control of blood pressure, blood glucose, and lipid levels, antiplatelet agents, obtaining regular exercise, and cessation of smoking.    I emphasized again the importance of smoking cessation.  Though I understand his use during this time of grief.  Thank you for allowing [place] to participate in this patient's care.   Adele Barthel, MD [facility] [place] Office: [phone] Pager: [phone]  03/23/2015, 10:39 AM

## 2015-03-27 ENCOUNTER — Encounter: Payer: Medicare Other | Admitting: Internal Medicine

## 2015-03-27 NOTE — Progress Notes (Signed)
Patient Care Team: Ardith Dark, PA-C as PCP - General (Physician Assistant)   HPI  [name] is a 69 y.o. male   Past Medical History  Diagnosis Date  . Coronary artery disease     Multivessel, S/p stenting to LCx  . Tobacco dependence   . Ventricular dysfunction     Mild Left  . Dyslipidemia   . Atrial fibrillation      Brief episode of  . Myocardial infarction   . COPD (chronic obstructive pulmonary disease)   . Peripheral vascular disease   . Stroke   . Cardiomyopathy, ischemic 10/27/2014    Past Surgical History  Procedure Laterality Date  . US echocardiography  12-26-2008    EF 40-45%  . Back surgery      cervical  . Stent      pt unsure of location  . Left heart catheterization with coronary angiogram N/A 10/27/2014    Procedure: LEFT HEART CATHETERIZATION WITH CORONARY ANGIOGRAM;  Surgeon: Blane Ohara, MD;  Location: [facility] CATH LAB;  Service: Cardiovascular;  Laterality: N/A;    Current Outpatient Prescriptions  Medication Sig Dispense Refill  . albuterol (PROAIR HFA) 108 (90 BASE) MCG/ACT inhaler Inhale 1-2 puffs into the lungs every 6 (six) hours as needed for wheezing or shortness of breath.     [name] amoxicillin-clavulanate (AUGMENTIN) 875-125 MG per tablet Take 1 tablet by mouth every 12 (twelve) hours. 24 tablet 0  . aspirin EC 81 MG tablet Take 81 mg by mouth daily.    [name] atorvastatin (LIPITOR) 80 MG tablet Take 1 tablet (80 mg total) by mouth daily at 6 PM. 30 tablet 5  . escitalopram (LEXAPRO) 20 MG tablet Take 30 mg by mouth daily.     [name] HYDROcodone-acetaminophen (NORCO/VICODIN) 5-325 MG per tablet Take 1 tablet by mouth every 6 (six) hours as needed for moderate pain.    [name] lisinopril (PRINIVIL,ZESTRIL) 5 MG tablet take 1 tablet by mouth once daily 90 tablet 3  . metoprolol tartrate (LOPRESSOR) 25 MG tablet Take 2 tablets (50 mg total) by mouth 2 (two) times daily. 30 tablet 10  . mometasone (NASONEX) 50 MCG/ACT nasal spray Place 2  sprays into the nose daily. 17 g 2  . nicotine (NICODERM CQ - DOSED IN MG/24 HOURS) 21 mg/24hr patch Place 1 patch (21 mg total) onto the skin daily. 28 patch 0  . nitroGLYCERIN (NITROSTAT) 0.4 MG SL tablet Place 0.4 mg under the tongue every 5 (five) minutes as needed for chest pain.     [name] tiotropium (SPIRIVA) 18 MCG inhalation capsule Place 1 capsule (18 mcg total) into inhaler and inhale daily. 30 capsule 3  . Umeclidinium Bromide (INCRUSE ELLIPTA) 62.5 MCG/INH AEPB Inhale 62.5 mcg into the lungs daily. 7 each 0   No current facility-administered medications for this visit.    Allergies  Allergen Reactions  . Aspirin Nausea Only and Other (See Comments)    Stomach upset  . Ibuprofen Hives    Review of Systems negative except from HPI and PMH  Physical Exam There were no vitals taken for this visit. Well developed and well nourished in no acute distress HENT normal E scleral and icterus clear Neck Supple JVP flat; carotids brisk and full Clear to ausculation  Regular rate and rhythm, no murmurs gallops or rub Soft with active bowel sounds No clubbing cyanosis  Edema Alert and oriented, grossly normal motor and sensory function Skin Warm and Dry  Assessment and  Plan

## 2015-03-28 ENCOUNTER — Encounter: Payer: Self-pay | Admitting: Internal Medicine

## 2015-04-27 ENCOUNTER — Ambulatory Visit: Payer: Medicare Other | Admitting: Vascular Surgery

## 2015-04-27 ENCOUNTER — Other Ambulatory Visit (HOSPITAL_COMMUNITY): Payer: Medicare Other

## 2015-09-21 ENCOUNTER — Encounter: Payer: Self-pay | Admitting: Vascular Surgery

## 2015-09-28 ENCOUNTER — Ambulatory Visit (HOSPITAL_COMMUNITY)
Admission: RE | Admit: 2015-09-28 | Discharge: 2015-09-28 | Disposition: A | Discharge status: Home / Self Care | Payer: Medicare Other | Source: Ambulatory Visit | Attending: Vascular Surgery | Admitting: Vascular Surgery

## 2015-09-28 ENCOUNTER — Ambulatory Visit (INDEPENDENT_AMBULATORY_CARE_PROVIDER_SITE_OTHER): Payer: Medicare Other | Admitting: Vascular Surgery

## 2015-09-28 ENCOUNTER — Encounter: Payer: Self-pay | Admitting: Vascular Surgery

## 2015-09-28 VITALS — BP 117/75 | HR 75 | Temp 98.5°F | Resp 16 | Ht 66.0 in | Wt 123.0 lb

## 2015-09-28 DIAGNOSIS — I714 Abdominal aortic aneurysm, without rupture, unspecified: Secondary | ICD-10-CM

## 2015-09-28 DIAGNOSIS — I723 Aneurysm of iliac artery: Secondary | ICD-10-CM

## 2015-09-28 DIAGNOSIS — F17200 Nicotine dependence, unspecified, uncomplicated: Secondary | ICD-10-CM | POA: Insufficient documentation

## 2015-09-28 DIAGNOSIS — E785 Hyperlipidemia, unspecified: Secondary | ICD-10-CM | POA: Diagnosis not present

## 2015-09-28 NOTE — Progress Notes (Signed)
Established Abdominal Aortic Aneurysm  History of Present Illness  [name] is a 70 y.o. (1946-05-04) male  who presents with chief complaint: follow up for AAA. 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 The patient does not have back or abdominal pain. The patient is still smoking.  He denies any changes in urinary habit or BP control.  The patient's PMH, PSH, SH, and FamHx are unchanged from 03/23/15   Current Outpatient Prescriptions  Medication Sig Dispense Refill  . amoxicillin-clavulanate (AUGMENTIN) 875-125 MG per tablet Take 1 tablet by mouth every 12 (twelve) hours. 24 tablet 0  . aspirin EC 81 MG tablet Take 81 mg by mouth daily.    [name] atorvastatin (LIPITOR) 80 MG tablet Take 1 tablet (80 mg total) by mouth daily at 6 PM. 30 tablet 5  . escitalopram (LEXAPRO) 20 MG tablet Take 30 mg by mouth daily.     [name] HYDROcodone-acetaminophen (NORCO/VICODIN) 5-325 MG per tablet Take 1 tablet by mouth every 6 (six) hours as needed for moderate pain.    [name] lisinopril (PRINIVIL,ZESTRIL) 5 MG tablet take 1 tablet by mouth once daily 90 tablet 3  . metoprolol tartrate (LOPRESSOR) 25 MG tablet Take 2 tablets (50 mg total) by mouth 2 (two) times daily. 30 tablet 10  . nicotine (NICODERM CQ - DOSED IN MG/24 HOURS) 21 mg/24hr patch Place 1 patch (21 mg total) onto the skin daily. 28 patch 0  . nitroGLYCERIN (NITROSTAT) 0.4 MG SL tablet Place 0.4 mg under the tongue every 5 (five) minutes as needed for chest pain.     [name] tiotropium (SPIRIVA) 18 MCG inhalation capsule Place 1 capsule (18 mcg total) into inhaler and inhale daily. 30 capsule 3  . Umeclidinium Bromide (INCRUSE ELLIPTA) 62.5 MCG/INH AEPB Inhale 62.5 mcg into the lungs daily. 7 each 0  . albuterol (PROAIR HFA) 108 (90 BASE) MCG/ACT inhaler Inhale 1-2 puffs into the lungs every 6 (six) hours as needed for wheezing or  shortness of breath.     . levofloxacin (LEVAQUIN) 500 MG tablet     . lisinopril (PRINIVIL,ZESTRIL) 10 MG tablet     . mirtazapine (REMERON SOL-TAB) 30 MG disintegrating tablet     . mometasone (NASONEX) 50 MCG/ACT nasal spray Place 2 sprays into the nose daily. 17 g 2  . oxyCODONE (OXY IR/ROXICODONE) 5 MG immediate release tablet     . oxyCODONE-acetaminophen (PERCOCET) 10-325 MG tablet     . tiZANidine (ZANAFLEX) 4 MG tablet      No current facility-administered medications for this visit.   Allergies  Allergen Reactions  . Aspirin Nausea Only and Other (See Comments)    Stomach upset  . Ibuprofen Hives   On ROS today: no change in urinary habits, +cigarette smoking   Physical Examination Filed Vitals:   09/28/15 0926  BP: 117/75  Pulse: 75  Temp: 98.5 F (36.9 C)  TempSrc: Oral  Resp: 16  Height: '5\' 6"'$  (1.676 m)  Weight: 123 lb (55.792 kg)  SpO2: 97%   Body mass index is 19.86 kg/(m^2).  General: A&O x 3, WD, thin  Pulmonary: Sym exp, good air movt, CTAB, no rales, rhonchi, & wheezing  Cardiac: RRR, Nl S1, S2, no Murmurs, rubs or gallops  Vascular: Vessel Right Left  Radial Palpable Palpable  Brachial Palpable Palpable  Carotid Palpable, without bruit Palpable, without bruit  Aorta Palpable N/A  Femoral Palpable Palpable  Popliteal Palpable Palpable  PT Palpable Palpable  DP Palpable Palpable   Gastrointestinal: soft, NTND, no G/R, no HSM, no masses, no CVAT B, palpable AAA  Musculoskeletal: M/S 5/5 throughout , Extremities without ischemic changes    Neurologic: Pain and light touch intact in extremities , Motor exam as listed above   Non-Invasive Vascular Imaging  AAA Duplex (09/28/2015)  Previous size: 4.2 cm (Date: 03/23/15)  Current size: 4.4 cm x 4.4 cm   R CIA: 3.4 cm x 3.1 cm (3.3 cm) with thrombus  L CIA: 1.3 cm x 1.2 (1.5 cm)    Medical Decision Making  JOCELYN LOWERY is a 70 y.o. (01-06-1946) male   who presents with: asymptomatic small AAA with increasing size, asx large R CIA aneurysm with increasing size, small L CIA aneurysm with decreasing size, h/o R RA PSA, prominent popliteal pulses   The R CIA aneurysm is essential at repair size.  The presence of intra-arterial thrombus also makes me reluctant to manage this non-surgically.  Based on this patient's exam and diagnostic studies, he needs: CTA abd/pelvis to determine if he is a candidate for EVAR w/ iliac branch device.  Will get the CTA in the next 2-4 weeks.  Will continue to observe R RA PSA due to dissection. Once it reaches 2.0 cm will refer to [facility] for their expertise with renal artery branch aneurysm.  I emphasized the importance of maximal medical management including strict control of blood pressure, blood glucose, and lipid levels, antiplatelet agents, obtaining regular exercise, and cessation of smoking.   I emphasized again the importance of smoking cessation.   Thank you for allowing [place] to participate in this patient's care.   Adele Barthel, MD, FACS [facility] [place] Office: [phone] Pager: [phone]  09/28/2015, 3:02 PM

## 2015-10-02 ENCOUNTER — Encounter: Payer: Self-pay | Admitting: Vascular Surgery

## 2015-10-05 ENCOUNTER — Other Ambulatory Visit: Payer: Self-pay | Admitting: *Deleted

## 2015-10-05 DIAGNOSIS — Z01812 Encounter for preprocedural laboratory examination: Secondary | ICD-10-CM

## 2015-10-08 ENCOUNTER — Inpatient Hospital Stay: Admission: RE | Admit: 2015-10-08 | Payer: Medicare Other | Source: Ambulatory Visit

## 2015-10-08 LAB — CREATININE, ISTAT: CREATININE, ISTAT: 0.8 mg/dL (ref 0.6–1.3)

## 2015-10-09 ENCOUNTER — Inpatient Hospital Stay: Admission: RE | Admit: 2015-10-09 | Payer: Medicare Other | Source: Ambulatory Visit

## 2015-10-12 ENCOUNTER — Ambulatory Visit: Payer: Medicare Other | Admitting: Vascular Surgery

## 2015-10-12 ENCOUNTER — Encounter: Payer: Self-pay | Admitting: Vascular Surgery

## 2015-10-19 ENCOUNTER — Ambulatory Visit
Admission: RE | Admit: 2015-10-19 | Discharge: 2015-10-19 | Disposition: A | Discharge status: Home / Self Care | Payer: Medicare Other | Source: Ambulatory Visit | Attending: Vascular Surgery | Admitting: Vascular Surgery

## 2015-10-19 ENCOUNTER — Ambulatory Visit (INDEPENDENT_AMBULATORY_CARE_PROVIDER_SITE_OTHER): Payer: Medicare Other | Admitting: Vascular Surgery

## 2015-10-19 ENCOUNTER — Encounter: Payer: Self-pay | Admitting: Vascular Surgery

## 2015-10-19 VITALS — BP 132/81 | HR 61 | Ht 66.0 in | Wt 126.0 lb

## 2015-10-19 DIAGNOSIS — I714 Abdominal aortic aneurysm, without rupture, unspecified: Secondary | ICD-10-CM

## 2015-10-19 DIAGNOSIS — I723 Aneurysm of iliac artery: Secondary | ICD-10-CM | POA: Diagnosis not present

## 2015-10-19 MED ORDER — IOPAMIDOL (ISOVUE-370) INJECTION 76%
75.0000 mL | Freq: Once | INTRAVENOUS | Status: AC | PRN
  Administered 2015-10-19: 75 mL via INTRAVENOUS

## 2015-10-19 NOTE — Progress Notes (Signed)
Established Abdominal Aortic Aneurysm  History of Present Illness   [name] is a 70 y.o. (02/05/1946) male who presents with chief complaint: follow up CTA.  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 The patient does not have back or abdominal pain. The patient is still smoking. He denies any changes in urinary habit or BP control.  The patient's PMH, PSH, SH, and FamHx are unchanged from 09/28/15.  Current Outpatient Prescriptions  Medication Sig Dispense Refill  . albuterol (PROAIR HFA) 108 (90 BASE) MCG/ACT inhaler Inhale 1-2 puffs into the lungs every 6 (six) hours as needed for wheezing or shortness of breath.     [name] amoxicillin-clavulanate (AUGMENTIN) 875-125 MG per tablet Take 1 tablet by mouth every 12 (twelve) hours. 24 tablet 0  . aspirin EC 81 MG tablet Take 81 mg by mouth daily.    [name] atorvastatin (LIPITOR) 80 MG tablet Take 1 tablet (80 mg total) by mouth daily at 6 PM. 30 tablet 5  . escitalopram (LEXAPRO) 20 MG tablet Take 30 mg by mouth daily.     [name] HYDROcodone-acetaminophen (NORCO/VICODIN) 5-325 MG per tablet Take 1 tablet by mouth every 6 (six) hours as needed for moderate pain.    [name] levofloxacin (LEVAQUIN) 500 MG tablet     . lisinopril (PRINIVIL,ZESTRIL) 10 MG tablet     . lisinopril (PRINIVIL,ZESTRIL) 5 MG tablet take 1 tablet by mouth once daily 90 tablet 3  . metoprolol tartrate (LOPRESSOR) 25 MG tablet Take 2 tablets (50 mg total) by mouth 2 (two) times daily. 30 tablet 10  . mirtazapine (REMERON SOL-TAB) 30 MG disintegrating tablet     . mometasone (NASONEX) 50 MCG/ACT nasal spray Place 2 sprays into the nose daily. 17 g 2  . nicotine (NICODERM CQ - DOSED IN MG/24 HOURS) 21 mg/24hr patch Place 1 patch (21 mg total) onto the skin daily. 28 patch 0  . nitroGLYCERIN (NITROSTAT) 0.4 MG SL tablet Place 0.4 mg under the tongue every 5  (five) minutes as needed for chest pain.     [name] oxyCODONE (OXY IR/ROXICODONE) 5 MG immediate release tablet     . oxyCODONE-acetaminophen (PERCOCET) 10-325 MG tablet     . tiotropium (SPIRIVA) 18 MCG inhalation capsule Place 1 capsule (18 mcg total) into inhaler and inhale daily. 30 capsule 3  . tiZANidine (ZANAFLEX) 4 MG tablet     . Umeclidinium Bromide (INCRUSE ELLIPTA) 62.5 MCG/INH AEPB Inhale 62.5 mcg into the lungs daily. 7 each 0   No current facility-administered medications for this visit.    Allergies  Allergen Reactions  . Aspirin Nausea Only and Other (See Comments)    Stomach upset  . Ibuprofen Hives    On ROS today: no abdominal or back pain, no thromboembolic sx   Physical Examination  Filed Vitals:   10/19/15 1531  BP: 132/81  Pulse: 61  Height: '5\' 6"'$  (1.676 m)  Weight: 126 lb (57.153 kg)  SpO2: 100%   Body mass index is 20.35 kg/(m^2).  General: A&O x 3, WD, thin  Pulmonary: Sym exp, good air movt, CTAB, no rales, rhonchi, & wheezing  Cardiac: RRR, Nl S1, S2, no Murmurs, rubs or gallops  Vascular: Vessel Right Left  Radial Palpable Palpable  Brachial Palpable Palpable  Carotid Palpable, without bruit Palpable, without bruit  Aorta Not palpable N/A  Femoral Palpable Palpable  Popliteal Not palpable Not palpable  PT Palpable Palpable  DP Palpable Palpable   Gastrointestinal: soft, NTND, no G/R, no HSM, no masses, no CVAT B  Musculoskeletal: M/S 5/5 throughout , Extremities without ischemic changes   Neurologic:  Pain and light touch intact in extremities , Motor exam as listed above  CTA abd/pelvis (10/19/2015) 1. Juxtarenal/infrarenal aortic aneurysm, increased from 3.9 to 5.4 cm maximum transverse diameter. 2. 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 3. 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 4. 6  mm left lower lobe pulmonary nodule, previously 5 mm. If the patient is at high risk for bronchogenic carcinoma, follow-up chest CT at 6-12 months is recommended. If the patient is at low risk for bronchogenic carcinoma, follow-up chest CT at 12 months is recommended. This recommendation follows the consensus statement: Guidelines for Management of Small Pulmonary Nodules Detected on CT Scans: A Statement from the [place] as published in Radiology 2005;237:395-400. 5. 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  His external iliac arteries look too diseased to tolerate a aortobi-iliac graft.    Medical Decision Making  The patient is a 70 y.o. male who presents with: asymptomatic enlarging AAA with large right common iliac artery and small left common iliac artery   His anatomy would make an endovascular repair difficulty with FDA approved endograft.  He might be a candidate for the [place] steerable EVAR investigation device but would need to be referred out for that.  On CT, his heart is being read as dilated, so will have have him evaluated by Cardiology to see what his risk stratification ends up.  If his cardiac risk is high, I will refer him to a center that is enrolled in the [place] investigation study vs referral to Dr. Sammuel Hines for considering of FEVAR (not certain if anatomy meets criteria)  Otherwise, he will need an aortobifemoral bypass with possible left renal reimplantation.   The patient will follow up with me in 2-4 weeks after his cardiac work-up is completed.  Thank you for allowing [place] to participate in this patient's care.   Adele Barthel, MD [facility]  [place] Office: [phone] Pager: [phone]  10/19/2015, 4:06 PM

## 2015-10-26 ENCOUNTER — Encounter: Payer: Self-pay | Admitting: Vascular Surgery

## 2015-10-28 ENCOUNTER — Emergency Department (HOSPITAL_BASED_OUTPATIENT_CLINIC_OR_DEPARTMENT_OTHER)
Admission: EM | Admit: 2015-10-28 | Discharge: 2015-10-28 | Disposition: A | Discharge status: Home / Self Care | Payer: Medicare Other | Attending: Emergency Medicine | Admitting: Emergency Medicine

## 2015-10-28 ENCOUNTER — Encounter (HOSPITAL_BASED_OUTPATIENT_CLINIC_OR_DEPARTMENT_OTHER): Payer: Self-pay | Admitting: Emergency Medicine

## 2015-10-28 DIAGNOSIS — F17210 Nicotine dependence, cigarettes, uncomplicated: Secondary | ICD-10-CM | POA: Diagnosis not present

## 2015-10-28 DIAGNOSIS — Z760 Encounter for issue of repeat prescription: Secondary | ICD-10-CM | POA: Diagnosis not present

## 2015-10-28 DIAGNOSIS — G8929 Other chronic pain: Secondary | ICD-10-CM | POA: Diagnosis not present

## 2015-10-28 NOTE — ED Notes (Signed)
Patient left prior to discharge

## 2015-10-28 NOTE — ED Notes (Signed)
Pt takes Percocet for chronic back and neck pain. States he ran out this am and need short term refill until his appt with his provider tomorrow.

## 2015-10-29 NOTE — Progress Notes (Signed)
Cardiology Office Note Date:  10/30/2015  Patient ID:  GEORDIE NOONEY, DOB 18-Oct-1945, MRN [id number] PCP:  Sheral Apley  Cardiologist:  Dr. [name] Electrophysiologist: Dr. Caryl Comes Pulmonary: Dr. Camillo Flaming Vascular: Dr. Bridgett Larsson   Chief Complaint: pre-op cardiac evaluation  History of Present Illness: SHARRIEFF SPRATLIN is a 70 y.o. male with history of PAFib noted once post DCCV of WCT, not placed on a/c, VT, AAA, CAD, HLD, tobacco abuse/COPD.  The patient was in [facility] Feb 2016, at which time he was found with [facility] he had cardioversion into AFib that resolved to SR.  Unfortunately during this hospiatl stay he signed out AMA prior to completion of his evaluation. Though did eventually get the cath done noting significant CAD without intervention.    He was seen by EP, Dr. Caryl Comes May 2016 in the office with suspect VT as the [facility] given CAD/ICM recommended ICD implant for secondary prevention, though this did not get done, it is unclear why, the patient states that it never got arranged, he isn't sure why, but did not f/u on it.  His last visit with cardiology was with Truitt Merle, NP Feb 2016 when he was arranged for the cardiac cath, but has not f/u with them since that time, he denies any CP, palpitations, no dizziness, near syncope or syncope.  He has not had any symptoms like those that he went to he ER for last Feb.  He denies symptoms of PND or orthopnea, has some limitations with DOE when carrying heavy items, otherwise states he is able to do his daily activities at his baseline SOB, felt secondary to COPD.  He denies noting any LE swelling ever in his history.  He is pending a visit with his pulmonologist in 2 weeks.  He has followed with vascular services and note by Dr. 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 His external iliac arteries look too diseased to tolerate a aortobi-iliac graft.   Past Medical History  Diagnosis Date  . Coronary artery disease     Multivessel, S/p stenting to LCx  . Tobacco dependence   . Ventricular dysfunction     Mild Left  . Dyslipidemia   . Atrial fibrillation (HCC)      Brief episode of  . Myocardial infarction ([place])   . COPD (chronic obstructive pulmonary disease) ([place])   . Peripheral vascular disease ([place])   . Stroke ([place])   . Cardiomyopathy, ischemic 10/27/2014  . AAA (abdominal aortic aneurysm) [facility])     Past Surgical History  Procedure Laterality Date  . US echocardiography  12-26-2008    EF 40-45%  . Back surgery      cervical  . Stent      pt unsure of location  . Left heart catheterization with coronary angiogram N/A 10/27/2014    Procedure: LEFT HEART CATHETERIZATION WITH CORONARY ANGIOGRAM;  Surgeon: Blane Ohara, MD;  Location: [facility] CATH LAB;  Service: Cardiovascular;  Laterality: N/A;    Current Outpatient Prescriptions  Medication Sig Dispense Refill  . aspirin EC 81 MG tablet Take 81 mg by mouth daily.    [name] atorvastatin (LIPITOR) 80 MG tablet Take 1 tablet (80 mg total) by mouth daily at 6 PM. 30 tablet 5  .  escitalopram (LEXAPRO) 20 MG tablet Take 30 mg by mouth daily.     [name] HYDROcodone-acetaminophen (NORCO/VICODIN) 5-325 MG per tablet Take 1 tablet by mouth every 6 (six) hours as needed for moderate pain.    [name] levofloxacin (LEVAQUIN) 500 MG tablet     . lisinopril (PRINIVIL,ZESTRIL) 10 MG tablet     . metoprolol tartrate (LOPRESSOR) 25 MG tablet Take 2 tablets (50 mg total) by mouth 2 (two) times daily. 30 tablet 10  . mirtazapine (REMERON SOL-TAB) 30 MG disintegrating tablet     . nicotine (NICODERM CQ - DOSED IN MG/24 HOURS) 21 mg/24hr patch Place 1 patch (21 mg total) onto the skin daily. 28 patch 0  .  nitroGLYCERIN (NITROSTAT) 0.4 MG SL tablet Place 0.4 mg under the tongue every 5 (five) minutes as needed for chest pain.     [name] oxyCODONE (OXY IR/ROXICODONE) 5 MG immediate release tablet     . oxyCODONE-acetaminophen (PERCOCET) 10-325 MG tablet     . tiotropium (SPIRIVA) 18 MCG inhalation capsule Place 1 capsule (18 mcg total) into inhaler and inhale daily. 30 capsule 3  . tiZANidine (ZANAFLEX) 4 MG tablet     . Umeclidinium Bromide (INCRUSE ELLIPTA) 62.5 MCG/INH AEPB Inhale 62.5 mcg into the lungs daily. 7 each 0  . albuterol (PROAIR HFA) 108 (90 BASE) MCG/ACT inhaler Inhale 1-2 puffs into the lungs every 6 (six) hours as needed for wheezing or shortness of breath.     . mometasone (NASONEX) 50 MCG/ACT nasal spray Place 2 sprays into the nose daily. 17 g 2   No current facility-administered medications for this visit.    Allergies:   Aspirin and Ibuprofen   Social History:  The patient  reports that he has been smoking Cigarettes.  He has a 25 pack-year smoking history. He has never used smokeless tobacco. He reports that he does not drink alcohol or use illicit drugs.   Family History:  The patient's family history includes AAA (abdominal aortic aneurysm) in his mother; Cancer in his father.  ROS:  Please see the history of present illness.  All other systems are reviewed and otherwise negative.   PHYSICAL EXAM:  VS:  BP 126/82 mmHg  Pulse 73  Ht '5\' 6"'$  (1.676 m)  Wt 126 lb (57.153 kg)  BMI 20.35 kg/m2 BMI: Body mass index is 20.35 kg/(m^2). Thin, well nourished, in no acute distress HEENT: normocephalic, atraumatic Neck: no JVD, carotid bruits or masses Cardiac:  normal S1, S2; RRR; extrasystoles noted, no significant murmurs, no rubs, or gallops Lungs:  clear to auscultation bilaterally, no active wheezing, no rhonchi or rales Abd: soft, nontender MS: no deformity or atrophy Ext: no edema Skin: warm and dry, no rash Neuro:  No gross deficits appreciated Psych: euthymic mood,  full affect    EKG:  Done today shows SR, PVCs, nonspecific ST/T changes appear similar to previous  10/16/14: Echocardiogram:  Study Conclusions - Left ventricle: The cavity size was mildly dilated. Wall thickness was normal. Systolic function was moderately reduced. The estimated ejection fraction was in the range of 35% to 40%. There is akinesis of the inferoseptal myocardium. Features are consistent with a pseudonormal left ventricular filling pattern, with concomitant abnormal relaxation and increased filling pressure (grade 2 diastolic dysfunction). - Mitral valve: Calcified annulus. There was moderate regurgitation. - Left atrium: The atrium was mildly dilated. - Right ventricle: The cavity size was moderately dilated. Wall thickness was normal. - Right atrium: The atrium was mildly dilated. - Pulmonary  arteries: Systolic pressure was moderately increased. PA peak pressure: 52 mm Hg (S).  10/27/14 [facility] Final Conclusions:  1. 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 Mild to moderate nonobstructive LAD stenosis with progressive branch vessel stenosis involving the first diagonal 3. Severe segmental LV systolic dysfunction with an LVEF estimated at 35% Recommendations: The patient is not experiencing symptoms of exertional angina. He has an ischemic cardiomyopathy. He has done very poorly with stents in the past and has occluded both segments where stents were remotely placed. I would recommend treating him with medical therapy. As per review of hospital records, he should undergo electrophysiology evaluation. There was a question of ventricular tachycardia and atrial fibrillation from previous hospitalization  CTA abd/pelvis (10/19/2015) 1. Juxtarenal/infrarenal aortic aneurysm, increased from 3.9 to 5.4 cm maximum transverse diameter. 2. Progressive Aneurysmal extension through bilateral common iliac arteries,  right 4.1 cm (previously 3) and left 2.2 cm (previously 19 mm). 3. 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 4. 6 mm left lower lobe pulmonary nodule, previously 5 mm. If the patient is at high risk for bronchogenic carcinoma, follow-up chest CT at 6-12 months is recommended. If the patient is at low risk for bronchogenic carcinoma, follow-up chest CT at 12 months is recommended. This recommendation follows the consensus statement: Guidelines for Management of Small Pulmonary Nodules Detected on CT Scans: A Statement from the [place] as published in Radiology 2005;237:395-400. 5. Cholelithiasis    Wt Readings from Last 3 Encounters:  10/30/15 126 lb (57.153 kg)  10/28/15 128 lb (58.06 kg)  10/19/15 126 lb (57.153 kg)     Other studies reviewed: Additional studies/records reviewed today include: summarized above  ASSESSMENT AND PLAN:  1. ICM/VT     Appears compensated, weight is stable     On BB/ACE therapy     We will have him f/u with Dr. Caryl Comes to re-visit ICD implantation  2. CAD     LHC 10/27/14 as above     No anginal symptoms     On ASA, BB, statin therapy     Scheduled f/u with Dr. [name]  3. enlarging AAA with large right common iliac artery and small left common iliac artery    Pending final plans for intervention    He has f/u planned with Dr. Bridgett Larsson  4. HTN     Appears controlled     Disposition: Today's EKG and case is reviewed with Dr. Lovena Le in clinic today, the patient is a high risk for an open surgical procedure and moderate risk for percutaneous approach given his CAD, ICM, and hx of VT.  Discussed with the patient, he understands and will contrinue his f/u with vascular surgery as planned.   He is being scheduled for f/u to re-visit with Dr. Caryl Comes for an ICD as well as Dr. [name] for his cardiac f/u as well.   Current medicines are reviewed at length with the patient today.  The patient did not have  any concerns regarding medicines.  Haywood Lasso, PA-C 10/30/2015 12:23 PM     [address] [place]  [phone] (office)  [phone] (fax)

## 2015-10-30 ENCOUNTER — Encounter: Payer: Self-pay | Admitting: Physician Assistant

## 2015-10-30 ENCOUNTER — Ambulatory Visit (INDEPENDENT_AMBULATORY_CARE_PROVIDER_SITE_OTHER): Payer: Medicare Other | Admitting: Physician Assistant

## 2015-10-30 VITALS — BP 126/82 | HR 73 | Ht 66.0 in | Wt 126.0 lb

## 2015-10-30 DIAGNOSIS — I10 Essential (primary) hypertension: Secondary | ICD-10-CM

## 2015-10-30 DIAGNOSIS — I255 Ischemic cardiomyopathy: Secondary | ICD-10-CM

## 2015-10-30 DIAGNOSIS — I2510 Atherosclerotic heart disease of native coronary artery without angina pectoris: Secondary | ICD-10-CM | POA: Diagnosis not present

## 2015-10-30 DIAGNOSIS — Z01818 Encounter for other preprocedural examination: Secondary | ICD-10-CM

## 2015-10-30 NOTE — Patient Instructions (Addendum)
Medication Instructions: Your physician recommends that you continue on your current medications as directed. Please refer to the Current Medication list given to you today.    If you need a refill on your cardiac medications before your next appointment, please call your pharmacy.  Labwork: NONE ORDER TODAY    Testing/Procedures:   Follow-Up:  DR Shelbie Ammons NEXT AVAILABLE APPT TIME  2 TO 3 MONTHS   WITH DR Caryl Comes IN ONE  MONTH  FOR ICD RE- EVALUATION  1 TO 2 MONTHS      Any Other Special Instructions Will Be Listed Below (If Applicable).

## 2015-11-02 ENCOUNTER — Encounter: Payer: Self-pay | Admitting: Vascular Surgery

## 2015-11-02 ENCOUNTER — Ambulatory Visit (INDEPENDENT_AMBULATORY_CARE_PROVIDER_SITE_OTHER): Payer: Medicare Other | Admitting: Vascular Surgery

## 2015-11-02 VITALS — BP 127/78 | HR 65 | Ht 66.0 in | Wt 128.0 lb

## 2015-11-02 DIAGNOSIS — I255 Ischemic cardiomyopathy: Secondary | ICD-10-CM

## 2015-11-02 DIAGNOSIS — I723 Aneurysm of iliac artery: Secondary | ICD-10-CM

## 2015-11-02 DIAGNOSIS — I714 Abdominal aortic aneurysm, without rupture, unspecified: Secondary | ICD-10-CM

## 2015-11-02 NOTE — Progress Notes (Signed)
Established Abdominal Aortic Aneurysm  History of Present Illness   [name] is a 70 y.o. ([birth year]) male who presents with chief complaint: follow up cardiology evaluation.  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 His external iliac arteries look too diseased to tolerate a aortobi-iliac graft. The patient does not have back or abdominal pain. The patient is still smoking. He denies any changes in urinary habit or BP control.  His cardiology risk stratification was high risk for open procedures and moderate risk for percutaneous procedures.   Past Medical History  Diagnosis Date  . Coronary artery disease     Multivessel, S/p stenting to LCx  . Tobacco dependence   . Ventricular dysfunction     Mild Left  . Dyslipidemia   . Atrial fibrillation (HCC)      Brief episode of  . Myocardial infarction ([place])   . COPD (chronic obstructive pulmonary disease) ([place])   . Peripheral vascular disease ([place])   . Stroke ([place])   . Cardiomyopathy, ischemic 10/27/2014  . AAA (abdominal aortic aneurysm) [facility])     Past Surgical History  Procedure Laterality Date  . US echocardiography  12-26-2008    EF 40-45%  . Back surgery      cervical  . Stent      pt unsure of location  . Left heart catheterization with coronary angiogram N/A 10/27/2014    Procedure: LEFT HEART CATHETERIZATION WITH CORONARY ANGIOGRAM;  Surgeon: Blane Ohara, MD;  Location: [facility] CATH LAB;  Service: Cardiovascular;  Laterality: N/A;    Social History   Social History  . Marital Status: Widowed    Spouse Name: N/A  . Number of Children: 1  . Years of Education: N/A   Occupational History  . Not on file.   Social History Main Topics  . Smoking  status: Light Tobacco Smoker -- 0.50 packs/day for 50 years    Types: Cigarettes  . Smokeless tobacco: Never Used  . Alcohol Use: No  . Drug Use: No  . Sexual Activity: Not on file   Other Topics Concern  . Not on file   Social History Narrative    Family History  Problem Relation Age of Onset  . AAA (abdominal aortic aneurysm) Mother   . Cancer Father     bone marrow    Current Outpatient Prescriptions  Medication Sig Dispense Refill  . aspirin EC 81 MG tablet Take 81 mg by mouth daily.    [name] atorvastatin (LIPITOR) 80 MG tablet Take 1 tablet (80 mg total) by mouth daily at 6 PM. 30 tablet 5  . escitalopram (LEXAPRO) 20 MG tablet Take 30 mg by mouth daily.     [name] HYDROcodone-acetaminophen (NORCO/VICODIN) 5-325 MG per tablet Take 1 tablet by mouth every 6 (six) hours as needed for moderate pain.    [name] levofloxacin (LEVAQUIN) 500 MG tablet     . lisinopril (PRINIVIL,ZESTRIL) 10 MG tablet     . metoprolol tartrate (LOPRESSOR) 25 MG tablet Take 2 tablets (50 mg total) by mouth 2 (two) times daily. 30 tablet 10  . mirtazapine (REMERON SOL-TAB) 30 MG disintegrating tablet     . nitroGLYCERIN (NITROSTAT) 0.4 MG SL tablet Place 0.4 mg under the tongue every 5 (five) minutes as needed for chest pain.     [name]  oxyCODONE (OXY IR/ROXICODONE) 5 MG immediate release tablet     . oxyCODONE-acetaminophen (PERCOCET) 10-325 MG tablet     . tiotropium (SPIRIVA) 18 MCG inhalation capsule Place 1 capsule (18 mcg total) into inhaler and inhale daily. 30 capsule 3  . tiZANidine (ZANAFLEX) 4 MG tablet     . Umeclidinium Bromide (INCRUSE ELLIPTA) 62.5 MCG/INH AEPB Inhale 62.5 mcg into the lungs daily. 7 each 0  . albuterol (PROAIR HFA) 108 (90 BASE) MCG/ACT inhaler Inhale 1-2 puffs into the lungs every 6 (six) hours as needed for wheezing or shortness of breath.     . mometasone (NASONEX) 50 MCG/ACT nasal spray Place 2 sprays into the nose daily. 17 g 2  . nicotine (NICODERM CQ - DOSED IN MG/24 HOURS) 21  mg/24hr patch Place 1 patch (21 mg total) onto the skin daily. (Patient not taking: Reported on 11/02/2015) 28 patch 0   No current facility-administered medications for this visit.     Allergies  Allergen Reactions  . Aspirin Nausea Only and Other (See Comments)    Stomach upset  . Ibuprofen Hives     REVIEW OF SYSTEMS:  (Positives checked otherwise negative)  CARDIOVASCULAR:   '[ ]'$  chest pain,  '[x]'$  chest pressure,  '[x]'$  palpitations,  '[ ]'$  shortness of breath when laying flat,  '[ ]'$  shortness of breath with exertion,   '[ ]'$  pain in feet when walking,  '[ ]'$  pain in feet when laying flat, '[ ]'$  history of blood clot in veins (DVT),  '[ ]'$  history of phlebitis,  '[ ]'$  swelling in legs,  '[ ]'$  varicose veins  PULMONARY:   '[ ]'$  productive cough,  '[ ]'$  asthma,  '[ ]'$  wheezing  NEUROLOGIC:   '[ ]'$  weakness in arms or legs,  '[ ]'$  numbness in arms or legs,  '[ ]'$  difficulty speaking or slurred speech,  '[ ]'$  temporary loss of vision in one eye,  '[ ]'$  dizziness  HEMATOLOGIC:   '[ ]'$  bleeding problems,  '[ ]'$  problems with blood clotting too easily  MUSCULOSKEL:   '[ ]'$  joint pain, '[ ]'$  joint swelling  GASTROINTEST:   '[ ]'$  vomiting blood,  '[ ]'$  blood in stool     GENITOURINARY:   '[ ]'$  burning with urination,  '[ ]'$  blood in urine  PSYCHIATRIC:   '[ ]'$  history of major depression  INTEGUMENTARY:   '[ ]'$  rashes,  '[ ]'$  ulcers  CONSTITUTIONAL:   '[ ]'$  fever,  '[ ]'$  chills   Physical Examination Filed Vitals:   11/02/15 1357  BP: 127/78  Pulse: 65  Height: '5\' 6"'$  (1.676 m)  Weight: 128 lb (58.06 kg)  SpO2: 96%  Body mass index is 20.67 kg/(m^2).  General: A&O x 3, WD, thin  Pulmonary: Sym exp, good air movt, CTAB, no rales, rhonchi, & wheezing  Cardiac: RRR, Nl S1, S2, no Murmurs, rubs or gallops  Vascular: Vessel Right Left  Radial Palpable Palpable  Brachial Palpable Palpable  Carotid Palpable, without bruit Palpable, without bruit  Aorta Not palpable N/A  Femoral  Palpable Palpable  Popliteal Not palpable Not palpable  PT Palpable Palpable  DP Palpable Palpable   Gastrointestinal: soft, NTND, no G/R, no HSM, no masses, no CVAT B  Musculoskeletal: M/S 5/5 throughout , Extremities without ischemic changes   Neurologic: Pain and light touch intact in extremities , Motor exam as listed above  Medical Decision Making  [name] is a 70 y.o. ([birth year]) male  who presents with: asymptomatic  enlarging AAA with large right common iliac artery and small left common iliac artery   We discussed the findings from his cardiologist.  We also discussed that in the event of aortic rupture, survival if he makes it to the hospital is historically only 50% with the majority 80-90% dying prior to arrival.  In this setting, if he is going to request attempt at repair, regardless of the cardiac status, his survival rate would be better elective that emergent.  Subsequently, I will schedule him for an EVAR with possible right iliac branch device, placement of Palmaz stent, possible open repair (ABF, ligation of bilateral common iliac artery) The patient is aware the risks of aortic surgery include but are not limited to: bleeding, need for transfusion, infection, death, stroke, paralysis, wound complications, bowel injuries, impotence, bowel ischemia, extended ventilation and future ventral hernias.   Overall, I cited a mortality rate likely to exceed 10% given his cardiac status in event of open repair and morbidity rate of 30%.  We will make arrangement for the procedure on 11/22/15.  Additional equipment including the iliac branch device to salvage the right internal iliac artery and Palmaz stent to straighten the angulation in the infrarenal aorta need to be special ordered.  Thank you for allowing [place] to participate in this patient's care.  Adele Barthel, MD, FACS [facility] Office: [phone] Pager:  [phone]  11/02/2015, 6:52 PM

## 2015-11-07 ENCOUNTER — Other Ambulatory Visit: Payer: Self-pay | Admitting: *Deleted

## 2015-11-22 ENCOUNTER — Encounter (HOSPITAL_COMMUNITY)
Admission: RE | Admit: 2015-11-22 | Discharge: 2015-11-22 | Disposition: A | Discharge status: Home / Self Care | Payer: Medicare Other | Source: Ambulatory Visit | Attending: Vascular Surgery | Admitting: Vascular Surgery

## 2015-11-22 ENCOUNTER — Encounter (HOSPITAL_COMMUNITY): Payer: Self-pay

## 2015-11-22 DIAGNOSIS — I4891 Unspecified atrial fibrillation: Secondary | ICD-10-CM | POA: Insufficient documentation

## 2015-11-22 DIAGNOSIS — Z01812 Encounter for preprocedural laboratory examination: Secondary | ICD-10-CM | POA: Diagnosis not present

## 2015-11-22 DIAGNOSIS — Z955 Presence of coronary angioplasty implant and graft: Secondary | ICD-10-CM | POA: Diagnosis not present

## 2015-11-22 DIAGNOSIS — J449 Chronic obstructive pulmonary disease, unspecified: Secondary | ICD-10-CM | POA: Diagnosis not present

## 2015-11-22 DIAGNOSIS — I714 Abdominal aortic aneurysm, without rupture: Secondary | ICD-10-CM | POA: Diagnosis not present

## 2015-11-22 DIAGNOSIS — I2510 Atherosclerotic heart disease of native coronary artery without angina pectoris: Secondary | ICD-10-CM | POA: Insufficient documentation

## 2015-11-22 DIAGNOSIS — F17200 Nicotine dependence, unspecified, uncomplicated: Secondary | ICD-10-CM | POA: Insufficient documentation

## 2015-11-22 DIAGNOSIS — E785 Hyperlipidemia, unspecified: Secondary | ICD-10-CM | POA: Insufficient documentation

## 2015-11-22 DIAGNOSIS — I252 Old myocardial infarction: Secondary | ICD-10-CM | POA: Diagnosis not present

## 2015-11-22 DIAGNOSIS — Z8673 Personal history of transient ischemic attack (TIA), and cerebral infarction without residual deficits: Secondary | ICD-10-CM | POA: Diagnosis not present

## 2015-11-22 DIAGNOSIS — Z7982 Long term (current) use of aspirin: Secondary | ICD-10-CM | POA: Diagnosis not present

## 2015-11-22 DIAGNOSIS — I255 Ischemic cardiomyopathy: Secondary | ICD-10-CM | POA: Insufficient documentation

## 2015-11-22 DIAGNOSIS — I10 Essential (primary) hypertension: Secondary | ICD-10-CM | POA: Insufficient documentation

## 2015-11-22 DIAGNOSIS — Z01818 Encounter for other preprocedural examination: Secondary | ICD-10-CM | POA: Insufficient documentation

## 2015-11-22 DIAGNOSIS — Z0183 Encounter for blood typing: Secondary | ICD-10-CM | POA: Diagnosis not present

## 2015-11-22 DIAGNOSIS — Z79899 Other long term (current) drug therapy: Secondary | ICD-10-CM | POA: Insufficient documentation

## 2015-11-22 HISTORY — DX: Major depressive disorder, single episode, unspecified: F32.9

## 2015-11-22 HISTORY — DX: Anxiety disorder, unspecified: F41.9

## 2015-11-22 HISTORY — DX: Depression, unspecified: F32.A

## 2015-11-22 HISTORY — DX: Hyperlipidemia, unspecified: E78.5

## 2015-11-22 HISTORY — DX: Essential (primary) hypertension: I10

## 2015-11-22 HISTORY — DX: Reserved for inherently not codable concepts without codable children: IMO0001

## 2015-11-22 LAB — TYPE AND SCREEN
ABO/RH(D): O POS
ANTIBODY SCREEN: NEGATIVE

## 2015-11-22 LAB — COMPREHENSIVE METABOLIC PANEL
ALBUMIN: 3.5 g/dL (ref 3.5–5.0)
ALT: 10 U/L — ABNORMAL LOW (ref 17–63)
ANION GAP: 10 (ref 5–15)
AST: 17 U/L (ref 15–41)
Alkaline Phosphatase: 94 U/L (ref 38–126)
BILIRUBIN TOTAL: 0.6 mg/dL (ref 0.3–1.2)
BUN: 17 mg/dL (ref 6–20)
CHLORIDE: 106 mmol/L (ref 101–111)
CO2: 22 mmol/L (ref 22–32)
Calcium: 9.2 mg/dL (ref 8.9–10.3)
Creatinine, Ser: 0.69 mg/dL (ref 0.61–1.24)
GFR calc Af Amer: >60 mL/min (ref >60)
GFR calc non Af Amer: >60 mL/min (ref >60)
GLUCOSE: 93 mg/dL (ref 65–99)
POTASSIUM: 4.4 mmol/L (ref 3.5–5.1)
SODIUM: 138 mmol/L (ref 135–145)
TOTAL PROTEIN: 7.6 g/dL (ref 6.5–8.1)

## 2015-11-22 LAB — URINALYSIS, ROUTINE W REFLEX MICROSCOPIC
BILIRUBIN URINE: NEGATIVE
Glucose, UA: NEGATIVE mg/dL
KETONES UR: NEGATIVE mg/dL
Leukocytes, UA: NEGATIVE
Nitrite: NEGATIVE
PH: 5 (ref 5.0–8.0)
Protein, ur: NEGATIVE mg/dL
SPECIFIC GRAVITY, URINE: 1.023 (ref 1.005–1.030)

## 2015-11-22 LAB — CBC
HEMATOCRIT: 35.2 % — AB (ref 39.0–52.0)
Hemoglobin: 11 g/dL — ABNORMAL LOW (ref 13.0–17.0)
MCH: 26.8 pg (ref 26.0–34.0)
MCHC: 31.3 g/dL (ref 30.0–36.0)
MCV: 85.6 fL (ref 78.0–100.0)
PLATELETS: 196 10*3/uL (ref 150–400)
RBC: 4.11 MIL/uL — ABNORMAL LOW (ref 4.22–5.81)
RDW: 14.9 % (ref 11.5–15.5)
WBC: 6.2 10*3/uL (ref 4.0–10.5)

## 2015-11-22 LAB — URINE MICROSCOPIC-ADD ON

## 2015-11-22 LAB — SURGICAL PCR SCREEN
MRSA, PCR: NEGATIVE
STAPHYLOCOCCUS AUREUS: NEGATIVE

## 2015-11-22 LAB — BLOOD GAS, ARTERIAL
ACID-BASE EXCESS: 2 mmol/L (ref 0.0–2.0)
BICARBONATE: 25.7 meq/L — AB (ref 20.0–24.0)
DRAWN BY: 206361
FIO2: 0.21
O2 SAT: 94.8 %
PATIENT TEMPERATURE: 98.6
PH ART: 7.444 (ref 7.350–7.450)
TCO2: 26.9 mmol/L (ref 0–100)
pCO2 arterial: 38.2 mmHg (ref 35.0–45.0)
pO2, Arterial: 74.3 mmHg — ABNORMAL LOW (ref 80.0–100.0)

## 2015-11-22 LAB — PROTIME-INR
INR: 1.23 (ref 0.00–1.49)
PROTHROMBIN TIME: 15.7 s — AB (ref 11.6–15.2)

## 2015-11-22 LAB — APTT: APTT: 35 s (ref 24–37)

## 2015-11-22 LAB — ABO/RH: ABO/RH(D): O POS

## 2015-11-22 NOTE — Progress Notes (Signed)
PCP - Dr. Ardith Dark Cardiologist - Dr. Cathie Olden      -cardiac clearance note 10/30/15 Pulmonologist - Dr. Camillo Flaming  EKG - 10/30/15 CXR - denies Echo - 10/2014 Stress test - denies Cardiac cath - 10/2014  Patient denies chest pain and acute shortness of breath at PAT appointment.

## 2015-11-22 NOTE — Pre-Procedure Instructions (Signed)
    Kemari Narez Sweis  11/22/2015      [facility], [place] [address] Phone: [phone] Fax: [phone]  [facility], [place] [address] Phone: [phone] Fax: [phone]    Your procedure is scheduled on Thursday, March 23rd, 2017.  Report to [facility] Admitting at 5:30 A.M.   Call this number if you have problems the morning of surgery:  [phone]   Remember:  Do not eat food or drink liquids after midnight.   Take these medicines the morning of surgery with A SIP OF WATER: Aspirin, Breo inhaler, Hydrocodone-acetaminophen (Norco/Vicodin) if needed, Metoprolol Tartrate (Lopressor), Oxycodone-acetaminophen (Percocet) if needed, Tizanidine (Zanaflex)   Stop taking: Ibuprofen, Advil, Motrin, Naproxen, Aleve, BC's, Goody's, Fish oil, all herbal medications, and all vitamins.    Do not wear jewelry.  Do not wear lotions, powders, or colognes.    Men may shave face and neck.  Do not bring valuables to the hospital.   [facility] is not responsible for any belongings or valuables.  Contacts, dentures or bridgework may not be worn into surgery.  Leave your suitcase in the car.  After surgery it may be brought to your room.  For patients admitted to the hospital, discharge time will be determined by your treatment team.  Patients discharged the day of surgery will not be allowed to drive home.   Special instructions:  See attached.   Please read over the following fact sheets that you were given. Pain Booklet, Coughing and Deep Breathing, Blood Transfusion Information, MRSA Information and Surgical Site Infection Prevention

## 2015-11-23 ENCOUNTER — Encounter (HOSPITAL_COMMUNITY): Payer: Self-pay | Admitting: Critical Care Medicine

## 2015-11-23 ENCOUNTER — Encounter (HOSPITAL_COMMUNITY): Payer: Self-pay | Admitting: Vascular Surgery

## 2015-11-26 NOTE — Progress Notes (Signed)
Anesthesia Chart Review:  Pt is a 70 year old male scheduled for abdominal aortic endovascular stent graft, insertion of iliac branch device palmaz stent on 11/29/2015 with Dr. Bridgett Larsson.   Cardiologist is Dr. Mertie Moores, last office visit over a year ago, f/u scheduled 12/28/15. EP cardiologist is Dr. Virl Axe, last office visit 10/29/15, f/u scheduled 12/04/15. PCP is Ardith Dark, [place].   PMH includes:  CAD (s/p Cx stent; chronic total occlusion of RCA and Cx by 2016 cath), MI, atrial fibrillation, ischemic cardiomyopathy, ventricular dysfunction, stroke, AAA, HTN, hyperlipidemia, COPD. Current smoker. BMI 21.  Medications include: ASA, lipitor, breo ellipta, metoprolol.   Preoperative labs reviewed.  PT 15.7. Dr. Bridgett Larsson has reviewed lab results.   EKG 10/30/15: sinus rhythm with frequent PVCs.  Moderate voltage criteria for LVH, may be normal variant. Nonspecific ST and T wave abnormality.   Cardiac cath 10/27/14:  Final Conclusions:  1. Chronic total occlusion of the RCA and LCx with extensive collaterals noted 2. Mild to moderate nonobstructive LAD stenosis with progressive branch vessel stenosis involving the first diagonal 3. Severe segmental LV systolic dysfunction with an LVEF estimated at 35% Recommendations: The patient is not experiencing symptoms of exertional angina. He has an ischemic cardiomyopathy. He has done very poorly with stents in the past and has occluded both segments where stents were remotely placed. I would recommend treating him with medical therapy. As per review of hospital records, he should undergo electrophysiology evaluation. There was a question of ventricular tachycardia and atrial fibrillation from previous hospitalization  Echo 10/16/14:  - Left ventricle: The cavity size was mildly dilated. Wall thickness was normal. Systolic function was moderately reduced. The estimated ejection fraction was in the range of 35% to 40%. There is akinesis of the inferoseptal  myocardium. Features are consistent with a pseudonormal left ventricular filling pattern, with concomitant abnormal relaxation and increased filling pressure (grade 2 diastolic dysfunction). - Mitral valve: Calcified annulus. There was moderate regurgitation. - Left atrium: The atrium was mildly dilated. - Right ventricle: The cavity size was moderately dilated. Wall thickness was normal. - Right atrium: The atrium was mildly dilated. - Pulmonary arteries: Systolic pressure was moderately increased. PA peak pressure: 52 mm Hg (S).  Pt seen by Jennings Books, PA in Dr. Olin Pia office 10/29/15 for pre-op eval. "The patient is a high risk for an open surgical procedure and moderate risk for percutaneous approach given his CAD, ICM, and hx of VT. Discussed with the patient, he understands".   If no changes, I anticipate pt can proceed with surgery as scheduled.   Willeen Cass, FNP-BC [facility]Anesthesiology Phone: [phone] 11/26/2015 2:09 PM

## 2015-11-28 MED ORDER — SODIUM CHLORIDE 0.9 % IV SOLN: INTRAVENOUS | Status: DC

## 2015-11-28 MED ORDER — DEXTROSE 5 % IV SOLN
1.5000 g | INTRAVENOUS | Status: DC
  Filled 2015-11-28: qty 1.5

## 2015-11-29 ENCOUNTER — Encounter (HOSPITAL_COMMUNITY): Admission: RE | Payer: Self-pay | Source: Ambulatory Visit

## 2015-11-29 ENCOUNTER — Inpatient Hospital Stay (HOSPITAL_COMMUNITY): Admission: RE | Admit: 2015-11-29 | Payer: Medicare Other | Source: Ambulatory Visit | Admitting: Vascular Surgery

## 2015-11-29 ENCOUNTER — Encounter (HOSPITAL_COMMUNITY): Payer: Self-pay | Admitting: Critical Care Medicine

## 2015-11-29 SURGERY — INSERTION, ENDOVASCULAR STENT GRAFT, AORTA, ABDOMINAL
Anesthesia: General | Laterality: Right

## 2015-11-29 MED ORDER — EPHEDRINE SULFATE 50 MG/ML IJ SOLN
INTRAMUSCULAR | Status: AC
  Filled 2015-11-29: qty 1

## 2015-11-29 MED ORDER — SUCCINYLCHOLINE CHLORIDE 20 MG/ML IJ SOLN
INTRAMUSCULAR | Status: AC
  Filled 2015-11-29: qty 1

## 2015-11-29 MED ORDER — LIDOCAINE HCL (CARDIAC) 20 MG/ML IV SOLN
INTRAVENOUS | Status: AC
  Filled 2015-11-29: qty 5

## 2015-11-29 MED ORDER — ROCURONIUM BROMIDE 50 MG/5ML IV SOLN
INTRAVENOUS | Status: AC
  Filled 2015-11-29: qty 1

## 2015-11-29 MED ORDER — MIDAZOLAM HCL 2 MG/2ML IJ SOLN
INTRAMUSCULAR | Status: AC
  Filled 2015-11-29: qty 2

## 2015-11-29 MED ORDER — FENTANYL CITRATE (PF) 250 MCG/5ML IJ SOLN
INTRAMUSCULAR | Status: AC
  Filled 2015-11-29: qty 5

## 2015-11-29 MED ORDER — SODIUM CHLORIDE 0.9 % IJ SOLN
INTRAMUSCULAR | Status: AC
  Filled 2015-11-29: qty 10

## 2015-11-30 ENCOUNTER — Encounter: Payer: Self-pay | Admitting: Cardiology

## 2015-12-04 ENCOUNTER — Ambulatory Visit: Payer: Medicare Other | Admitting: Internal Medicine

## 2015-12-05 ENCOUNTER — Other Ambulatory Visit: Payer: Self-pay

## 2015-12-05 ENCOUNTER — Encounter: Payer: Self-pay | Admitting: Internal Medicine

## 2015-12-12 ENCOUNTER — Encounter (HOSPITAL_COMMUNITY): Payer: Self-pay | Admitting: *Deleted

## 2015-12-12 NOTE — Progress Notes (Signed)
Pt denies acute cardiopulmonary symptoms. Pt is under the care of Dr. Cathie Olden, Cardiology. Pt Denies having a stress test ,made aware to stop  taking vitamins, fish oil, and herbal medications. Do not take any NSAIDs ie: Ibuprofen, Advil, Naproxen, BC and Goody Powder. Pt made aware to bring Albuterol inhaler in on DOS. Pt verbalized understanding of all pre-op instructions.

## 2015-12-13 ENCOUNTER — Inpatient Hospital Stay (HOSPITAL_COMMUNITY): Admission: RE | Admit: 2015-12-13 | Payer: Medicare Other | Source: Ambulatory Visit | Admitting: Vascular Surgery

## 2015-12-13 ENCOUNTER — Other Ambulatory Visit: Payer: Self-pay

## 2015-12-13 SURGERY — INSERTION, ENDOVASCULAR STENT GRAFT, AORTA, ABDOMINAL
Anesthesia: General

## 2015-12-13 MED ORDER — DEXTROSE 5 % IV SOLN
1.5000 g | INTRAVENOUS | Status: AC
  Administered 2016-01-03 (×2): 1.5 g via INTRAVENOUS

## 2015-12-13 NOTE — Progress Notes (Signed)
   Daily Progress Note  Due to scheduling concerns that this patient's EVAR might start 4-6 pm today, I recommended we reschedule to first case on 20 APR 17.  - pt agreed with rescheduling.   Adele Barthel, MD [facility] Office: [phone] Pager: [phone]  12/13/2015, 7:12 AM

## 2015-12-17 ENCOUNTER — Observation Stay (HOSPITAL_COMMUNITY): Payer: Medicare Other | Admitting: Internal Medicine

## 2015-12-17 ENCOUNTER — Observation Stay
Admission: AD | Admit: 2015-12-17 | Discharge: 2015-12-19 | Disposition: A | Discharge status: Home / Self Care | Payer: Medicare Other | Source: Other Acute Inpatient Hospital | Attending: Internal Medicine | Admitting: Internal Medicine

## 2015-12-17 ENCOUNTER — Encounter (HOSPITAL_COMMUNITY): Payer: Self-pay | Admitting: Internal Medicine

## 2015-12-17 DIAGNOSIS — F329 Major depressive disorder, single episode, unspecified: Secondary | ICD-10-CM | POA: Insufficient documentation

## 2015-12-17 DIAGNOSIS — R079 Chest pain, unspecified: Principal | ICD-10-CM | POA: Diagnosis present

## 2015-12-17 DIAGNOSIS — Z808 Family history of malignant neoplasm of other organs or systems: Secondary | ICD-10-CM | POA: Insufficient documentation

## 2015-12-17 DIAGNOSIS — Z955 Presence of coronary angioplasty implant and graft: Secondary | ICD-10-CM

## 2015-12-17 DIAGNOSIS — Z7982 Long term (current) use of aspirin: Secondary | ICD-10-CM | POA: Insufficient documentation

## 2015-12-17 DIAGNOSIS — I714 Abdominal aortic aneurysm, without rupture: Secondary | ICD-10-CM | POA: Diagnosis present

## 2015-12-17 DIAGNOSIS — M542 Cervicalgia: Secondary | ICD-10-CM | POA: Insufficient documentation

## 2015-12-17 DIAGNOSIS — R252 Cramp and spasm: Secondary | ICD-10-CM | POA: Insufficient documentation

## 2015-12-17 DIAGNOSIS — Z79899 Other long term (current) drug therapy: Secondary | ICD-10-CM | POA: Insufficient documentation

## 2015-12-17 DIAGNOSIS — I10 Essential (primary) hypertension: Secondary | ICD-10-CM | POA: Diagnosis present

## 2015-12-17 DIAGNOSIS — F419 Anxiety disorder, unspecified: Secondary | ICD-10-CM | POA: Insufficient documentation

## 2015-12-17 DIAGNOSIS — E785 Hyperlipidemia, unspecified: Secondary | ICD-10-CM | POA: Diagnosis present

## 2015-12-17 DIAGNOSIS — G8929 Other chronic pain: Secondary | ICD-10-CM | POA: Diagnosis present

## 2015-12-17 DIAGNOSIS — J449 Chronic obstructive pulmonary disease, unspecified: Secondary | ICD-10-CM | POA: Diagnosis present

## 2015-12-17 DIAGNOSIS — I2510 Atherosclerotic heart disease of native coronary artery without angina pectoris: Secondary | ICD-10-CM | POA: Diagnosis present

## 2015-12-17 DIAGNOSIS — F17210 Nicotine dependence, cigarettes, uncomplicated: Secondary | ICD-10-CM | POA: Insufficient documentation

## 2015-12-17 HISTORY — DX: Atherosclerotic heart disease of native coronary artery without angina pectoris: I25.10

## 2015-12-17 HISTORY — DX: Other muscle spasm: M62.838

## 2015-12-17 HISTORY — DX: Chronic obstructive pulmonary disease, unspecified (CMS HCC): J44.9

## 2015-12-17 HISTORY — DX: Abdominal aortic aneurysm, without rupture (CMS HCC): I71.4

## 2015-12-17 HISTORY — DX: Hyperlipidemia, unspecified: E78.5

## 2015-12-17 HISTORY — DX: Anxiety disorder, unspecified: F41.9

## 2015-12-17 HISTORY — DX: Abdominal aortic aneurysm, without rupture, unspecified: I71.40

## 2015-12-17 HISTORY — DX: Depression, unspecified: F32.A

## 2015-12-17 HISTORY — DX: Essential (primary) hypertension: I10

## 2015-12-17 MED ORDER — MIRTAZAPINE 15 MG TABLET
15.00 mg | ORAL_TABLET | Freq: Every evening | ORAL | Status: DC
Start: 2015-12-18 — End: 2015-12-19
  Administered 2015-12-18: 15 mg via ORAL
  Filled 2015-12-17 (×2): qty 1

## 2015-12-17 MED ORDER — TIOTROPIUM INHALATION DEVICE - RN
Freq: Once | Status: DC
Start: 2015-12-18 — End: 2015-12-19
  Administered 2015-12-18: 0 via ORAL
  Filled 2015-12-17: qty 1

## 2015-12-17 MED ORDER — TIOTROPIUM BROMIDE 18 MCG CAPSULE INHALATION DEVICE - RN
18.0000 ug | ORAL_CAPSULE | Freq: Every day | RESPIRATORY_TRACT | Status: DC
Start: 2015-12-18 — End: 2015-12-19
  Administered 2015-12-18: 18 ug via RESPIRATORY_TRACT
  Filled 2015-12-17 (×2): qty 1

## 2015-12-17 MED ORDER — ASPIRIN 81 MG TABLET,DELAYED RELEASE
81.0000 mg | DELAYED_RELEASE_TABLET | Freq: Every day | ORAL | Status: DC
Start: 2015-12-18 — End: 2015-12-19
  Administered 2015-12-18: 81 mg via ORAL
  Filled 2015-12-17 (×2): qty 1

## 2015-12-17 MED ORDER — SODIUM CHLORIDE 0.9 % (FLUSH) INJECTION SYRINGE
2.0000 mL | INJECTION | Freq: Three times a day (TID) | INTRAMUSCULAR | Status: DC
Start: 2015-12-18 — End: 2015-12-19
  Administered 2015-12-18: 2 mL
  Administered 2015-12-18 (×3): 0 mL
  Administered 2015-12-19: 2 mL

## 2015-12-17 MED ORDER — METOPROLOL TARTRATE 25 MG TABLET
25.00 mg | ORAL_TABLET | Freq: Two times a day (BID) | ORAL | Status: DC
Start: 2015-12-18 — End: 2015-12-19
  Administered 2015-12-18: 25 mg via ORAL
  Administered 2015-12-18 (×2): 0 mg via ORAL
  Filled 2015-12-17 (×5): qty 1

## 2015-12-17 MED ORDER — TIZANIDINE 4 MG TABLET
4.00 mg | ORAL_TABLET | Freq: Three times a day (TID) | ORAL | Status: DC
Start: 2015-12-18 — End: 2015-12-19
  Administered 2015-12-18 (×2): 4 mg via ORAL
  Administered 2015-12-18: 0 mg via ORAL
  Administered 2015-12-18: 4 mg via ORAL
  Filled 2015-12-17 (×7): qty 1

## 2015-12-17 MED ORDER — NITROGLYCERIN 0.4 MG SUBLINGUAL TABLET
0.40 mg | SUBLINGUAL_TABLET | SUBLINGUAL | Status: DC | PRN
Start: 2015-12-17 — End: 2015-12-19

## 2015-12-17 MED ORDER — ATORVASTATIN 40 MG TABLET
40.00 mg | ORAL_TABLET | Freq: Every evening | ORAL | Status: DC
Start: 2015-12-18 — End: 2015-12-19
  Administered 2015-12-18: 0 mg via ORAL
  Administered 2015-12-18: 40 mg via ORAL
  Filled 2015-12-17 (×3): qty 1

## 2015-12-17 MED ORDER — ESCITALOPRAM 10 MG TABLET
10.0000 mg | ORAL_TABLET | Freq: Every day | ORAL | Status: DC
Start: 2015-12-18 — End: 2015-12-19
  Administered 2015-12-18: 10 mg via ORAL
  Filled 2015-12-17 (×2): qty 1

## 2015-12-17 MED ORDER — HYDROCODONE 5 MG-ACETAMINOPHEN 325 MG TABLET
1.00 | ORAL_TABLET | Freq: Four times a day (QID) | ORAL | Status: DC | PRN
Start: 2015-12-17 — End: 2015-12-19
  Administered 2015-12-18 – 2015-12-19 (×4): 1 via ORAL
  Filled 2015-12-17 (×4): qty 1

## 2015-12-17 MED ORDER — LISINOPRIL 5 MG TABLET
5.0000 mg | ORAL_TABLET | Freq: Every day | ORAL | Status: DC
Start: 2015-12-18 — End: 2015-12-19
  Administered 2015-12-18: 5 mg via ORAL
  Filled 2015-12-17 (×2): qty 1

## 2015-12-17 MED ORDER — SODIUM CHLORIDE 0.9 % (FLUSH) INJECTION SYRINGE
2.0000 mL | INJECTION | INTRAMUSCULAR | Status: DC | PRN
Start: 2015-12-17 — End: 2015-12-19

## 2015-12-17 MED ORDER — HEPARIN (PORCINE) 5,000 UNIT/ML INJECTION SOLUTION
5000.0000 [IU] | Freq: Three times a day (TID) | INTRAMUSCULAR | Status: DC
Start: 2015-12-18 — End: 2015-12-18
  Administered 2015-12-18 (×3): 0 [IU] via SUBCUTANEOUS
  Filled 2015-12-17 (×5): qty 1

## 2015-12-17 MED ORDER — ALBUTEROL SULFATE HFA 90 MCG/ACTUATION AEROSOL INHALER - RN
2.00 | Freq: Four times a day (QID) | RESPIRATORY_TRACT | Status: DC | PRN
Start: 2015-12-17 — End: 2015-12-19
  Filled 2015-12-17: qty 8

## 2015-12-17 NOTE — Nurses Notes (Signed)
Patient arrived to [facility]. Ambulated from gurney to bed with no problems. No c/o chest pain or SOB at this time. Will continue to monitor.

## 2015-12-17 NOTE — H&P (Addendum)
[facility]  General Medicine  Medicine 3 Admission H&P    Date of Service:  12/18/2015  Todd Hampton,Todd D., 70 y.o. male  Date of Admission:  12/17/2015  Date of Birth:  Oct 13, 1945  PCP: No primary care provider on file.    Information Obtained from: patient, health care provider and history reviewed via medical record    Chief Complaint:  Chest Pain    History of Present Illness      Todd Miner. Hampton is a 70 y.o. male who presents with chest pain yesterday AM.  He was sitting at home when he started having pain in his chest that eventually resolved on it's own after a few minutes without any medications.  He went into [facility].  His EKG did not show ST-T wave changes and troponin was 10.  He has a history of CAD, requiring 2 stents last year (in [place], [place]).  A year or 2 prior to this he had to be resuscitated after he "had an arrhythmia that required defibrillation."  He has no other cardiac history.      Patient has a history of abdominal aortic aneurysm measuring around 5 cm that he has been seeing doctors [facility] in [place], [place].  He was supposed to be operated on last month but states that a machine they needed was not working and he was asked to come back at a later date.  His brother suggested that since he also had a history of this aneurysm, it may be of benefit to come to [facility] to have this evaluated also for possible surgical intervention.      He has a PMH of COPD, CAD s/p 2 stents in 2016, AAA measuring 5 cm, HTN, HLD, and  Depression/Anxiety.    Past Medical History:   Diagnosis Date    Aneurysm of abdominal aorta (HCC)     Anxiety     CAD (coronary artery disease)     COPD (chronic obstructive pulmonary disease) (HCC)     Depression     HLD (hyperlipidemia)     HTN (hypertension)     Muscle spasms of neck     Past Surgical History:   Procedure Laterality Date    HX CERVICAL SPINE SURGERY      HX HERNIA REPAIR                   Medications Prior to Admission     Prescriptions    albuterol sulfate (PROVENTIL OR VENTOLIN OR PROAIR) 90 mcg/actuation Inhalation HFA Aerosol Inhaler    Take 1-2 Puffs by inhalation Every 6 hours as needed    aspirin (ECOTRIN) 81 mg Oral Tablet, Delayed Release (E.C.)    Take 81 mg by mouth Once a day    atorvastatin (LIPITOR) 40 mg Oral Tablet    Take 40 mg by mouth Every evening    escitalopram oxalate (LEXAPRO) 10 mg Oral Tablet    Take 10 mg by mouth Once a day    HYDROcodone-acetaminophen (NORCO) 5-325 mg Oral Tablet    Take 1 Tab by mouth Every 6 hours as needed for Pain    lisinopril (PRINIVIL) 5 mg Oral Tablet    Take 5 mg by mouth Once a day    metoprolol (LOPRESSOR) 25 mg Oral Tablet    Take 25 mg by mouth Twice daily    mirtazapine (REMERON) 15 mg Oral Tablet    Take 15 mg by mouth Every night  nitroGLYCERIN (NITROSTAT) 0.4 mg Sublingual Tablet, Sublingual    0.4 mg by Sublingual route Every 5 minutes as needed for Chest pain for 3 doses over 15 minutes    tiotropium bromide (SPIRIVA HANDIHALER) 18 mcg Inhalation Capsule, w/Inhalation Device    Take 18 mcg by inhalation Once a day    tiZANidine (ZANAFLEX) 4 mg Oral Tablet    Take 4 mg by mouth Three times a day          Allergies   Allergen Reactions    Ibuprofen Hives/ Urticaria       Family History   Problem Relation Age of Onset    Cancer Father      Bone marrow cancer       Social History     Social History    Marital status: Widowed     Spouse name: N/A    Number of children: N/A    Years of education: N/A     Occupational History    Barista     Social History Main Topics    Smoking status: Current Every Day Smoker     Packs/day: 1.00     Years: 40.00     Types: Cigarettes    Smokeless tobacco: Not on file    Alcohol use No    Drug use: No    Sexual activity: Not on file     Other Topics Concern    Not on file     Social History Narrative         Review of Systems     Constitutional: no fever, no chills, no  fatigue, no weight loss/gain  HEENT: no headache, no sore throat  CV: no chest pain, no palpitations, no edema  Respiratory: mild shortness of breath, Mild cough, no wheezing  GI: no nausea, no vomiting, no diarrhea; mild lower quadrant abdominal pain  GU: no dysuria, no hematuria  Musculoskeletal: no joint pain, no muscle pain  Skin: no rash, no dryness  Psych: no depression, no anxiety  Neuro: no weakness, no paresthesias  Endocrine: no history of diabetes, no polyuria, no polydipsia; no temp intolerance  Heme: no history of clots, no easy bleeding/bruising  Allergy: no history of anaphylaxis      Physical Exam    Constitutional:   Appears alert, well-developed and pleasant. No apparent distress.   Head:  Normocephalic and atraumatic, mucous membranes moist.  Eyes: EOMI, sclareae non-icteric, conjunctivae clear  Cardiovascular: Bradycardic and regular rhythm, no murmurs/rubs/gallops.  Respiratory: Mild wheezing heard throughout, no rhonchi/crackles.   Extremities: No clubbing/cyanosis/edema.   Abdominal: Bowel sounds normal, soft, non-tender to palpation, non-distended.  Skin: Skin warm and dry. No apparent lesions or rashes.  Neurological: Awake, alert & oriented x3.  Psychiatric: Normal mood & affect. Thought process linear. Speech volume and speed normal    Labs    I have reviewed all lab results.    Blood Glucose:  No results for input(s): GLUCOSEPOC in the last 24 hours.    CBC:  Recent Labs      12/17/15   2359   WBC  5.5   HGB  11.5*   HCT  35.5*   PLTCNT  186       Differential:   Recent Labs      12/17/15   2359   PMNS  73   LYMPHOCYTES  17   MONOCYTES  7   EOSINOPHIL  3   BASOPHILS  1  0.04   PMNABS  3.99   LYMPHSABS  0.94*   MONOSABS  0.36   EOSABS  0.14       BMP:  Recent Labs      12/17/15   2358   SODIUM  138   POTASSIUM  4.0   CHLORIDE  104   CO2  28   BUN  17   CREATININE  0.94   GFR  >59   ANIONGAP  6       Recent Labs      12/17/15   2358   CALCIUM  9.3   ALBUMIN  3.5   MAGNESIUM  2.0      PHOSPHORUS  3.9       Coagulation Studies:  Recent Labs      12/17/15   2359   INR  1.12   PROTHROMTME  12.6       Cardiac Markers:  Recent Labs      12/17/15   2358   TROPONINI  <7        Liver/Pancreas:  Recent Labs      12/17/15   2358   TOTALPROTEIN  7.7   ALBUMIN  3.5   AST  14   ALT  7   ALKPHOS  101     Blood Gas:  Recent Labs      12/17/15   2358   FI02  21.0   PH  7.40   PCO2  52.00*   PO2  19.0*   BICARBONATE  27.8*   BASEEXCESS  6.0*         Microbiology/Pathology     None performed    Imaging Studies     None performed  Assessment and Plan    DNR Status:  Full Code    Assessment:  Todd Hampton is a 70 y.o. male who presents with a short episode of chest pain that he said only lasted a few minutes.  His EKG and troponins at outside hospital and here have been negative no concerning for ACS.  He also has a history of AAA measuring 5 cm that we are working to obtain outside records.      Plan:    Chest Pain - resolved   CAD  * TIMI 3 (Age, RF and ASA)  * EKG without contiguous leads with ST-T wave elevations or depressions.  Troponin negative x 1.  * Given that patient does not have swelling of lower extremities, nor complains of shortness of breath or chest pain with activity to indicate worsening EF will hold off on TTE for now.    * Continue telemetry  * Nitro and EKG PRN chest pain  * Continue ASA 81 mg    AAA measuring 5 cm  * Per patient, he was supposed to have surgery to repair this last month hospital was not able to complete this.    * Working to obtain imaging records from [facility] (records fax [phone]).  Awaiting response.  * Consider Vascular surgery consult tomorrow vs outpatient follow up    COPD  * Albuterol and Spiriva ordered  * Patient had mild wheezing on exam, duo-nebs PRN    HTN   HLD  * Normotensive on admission  * Continue Lisinopril 5 mg and Metoprolol 25 mg BID  * Continue Atorvastatin 40 mg daily    Tobacco Abuse - Patient currently not ready to  quit but has thought about it more recently.  Nicotine patch  and inhaler.    Depression   Anxiety - Continue lexapro and Remeron    Chronic Neck Pain   Muscle Spasms  * Patient has history of cervical neck surgery with which he has chronic pain and muscle spasms.  * Continue Norco 5 q6h  * Continue tizanidine    DVT/PE Prophylaxis: Heparin    Jacqualine MauFahad Chaudhary, MD 12/18/2015 02:47  Internal Medicine, PGY-1  [facility]  Pager [phone]      I saw and examined the patient.  I reviewed the resident's note.  I agree with the findings and plan of care as documented in the resident's note.  Any exceptions/additions are edited/noted.    Katina DungNathan Greer Wainright, MD

## 2015-12-17 NOTE — Care Management Notes (Signed)
MARS INTAKE      Referring Provider and Contact Number:  West Pugh NP  Transfer Source and Contact Number:   [facility] [phone]  Transfer Emergent:  No    Date Admitted:  12/16/2015  Diagnosis:  Chest Pain/Normal Troponin  PMH:  HTN/Hyperlipidemia/PVD/Congenital Aortic valve/4 Stents over the last 4 years    Dialysis:   No    Cancer:  No     Isolation:  No     Is patient established with family practice/attending?    Reason for transfer:  Patient requires higher level of care    Patient story/clinical presentation:  Patient presented with shortness of breath, diaphoretic, and chest pain, which resolved after 1-2 minutes with no medication given.  Patient requested transfer to tertiary care due to cardiac history, and spoke with Dr. Tye Savoy of cardiology service and recommended medicine admit, with consult if necessary.    Current vital signs:    HR:     58   BP:     158/88   Resp:     18   Temp:     97.8   Sats:     97   O2:     RA       Per MD labs WNL unless noted below.    Labs:  WBC (3.6-11)    HGB (13.1-17.3) 12.8   Hct (39.8-50.2) 38.7   PLT (140-400)    Na (135-145)    K+ (3.5-5.1)    CL (96-111)    CO2 (23-35) 31   BUN (8-26)    Cr (0.62-1.27)    Glucose (60-105)    Ca (8.5-10.4)    Mag (1.6-2.5) 2.0   Phos (2.4-4.7)    BNP (<100)    D-Dimer (<233)    AST (8-41)    ALT (<55)    Alk Phos (<150)    Amylase (25-125)    Lipase (10-80)    T. Bili (0.3-1.3)    PT (8.7-13.2)    PTT (25.1-36.5)    INR (0.8-1.2)    Trip-1 (<0.03) 0.01   CK    CPK    CK-MB    ABG ph    ABG PCO2    ABG PO2    ABG HCO3    Base def/exc    LP Glucose    LP Neutrophil    LP Protein    LP WBC    LP Other            Radiology (please place images on image grid): Imaging to be placed on the grid  EKG:Non specific T wave   IV Access:peripheral  Medications, IVF, Drips:    Oxygen/Bipap/Vent settings:    TV:        Peep:        FiO2:        Rate:            Pt class and accommodation: inpatient/floor with telemetry  Service and  accepting MD: Medicine 5/Dr. Lunette Stands    *Send Text Page to accepting service MD. *

## 2015-12-18 ENCOUNTER — Encounter (HOSPITAL_COMMUNITY): Payer: Self-pay | Admitting: Internal Medicine

## 2015-12-18 DIAGNOSIS — I493 Ventricular premature depolarization: Secondary | ICD-10-CM

## 2015-12-18 DIAGNOSIS — I10 Essential (primary) hypertension: Secondary | ICD-10-CM

## 2015-12-18 DIAGNOSIS — R079 Chest pain, unspecified: Secondary | ICD-10-CM

## 2015-12-18 DIAGNOSIS — I517 Cardiomegaly: Secondary | ICD-10-CM

## 2015-12-18 DIAGNOSIS — I2510 Atherosclerotic heart disease of native coronary artery without angina pectoris: Secondary | ICD-10-CM

## 2015-12-18 DIAGNOSIS — J449 Chronic obstructive pulmonary disease, unspecified: Secondary | ICD-10-CM

## 2015-12-18 LAB — BASIC METABOLIC PANEL
ANION GAP: 5 mmol/L (ref 4–13)
ANION GAP: 6 mmol/L (ref 4–13)
BUN/CREA RATIO: 18 (ref 6–22)
BUN/CREA RATIO: 18 (ref 6–22)
BUN/CREA RATIO: 19 (ref 6–22)
BUN: 15 mg/dL (ref 8–25)
BUN: 17 mg/dL (ref 8–25)
CALCIUM: 9.2 mg/dL (ref 8.5–10.4)
CALCIUM: 9.3 mg/dL (ref 8.5–10.4)
CALCIUM: 9.3 mg/dL (ref 8.5–10.4)
CHLORIDE: 104 mmol/L (ref 96–111)
CHLORIDE: 104 mmol/L (ref 96–111)
CO2 TOTAL: 28 mmol/L (ref 22–32)
CO2 TOTAL: 28 mmol/L (ref 22–32)
CREATININE: 0.81 mg/dL (ref 0.62–1.27)
CREATININE: 0.94 mg/dL (ref 0.62–1.27)
ESTIMATED GFR: >59 mL/min/1.73mˆ2 (ref >59)
ESTIMATED GFR: >59 mL/min/1.73mˆ2 (ref >59)
ESTIMATED GFR: >59 mL/min/{1.73_m2} (ref >59)
GLUCOSE: 104 mg/dL (ref 65–139)
GLUCOSE: 96 mg/dL (ref 65–139)
POTASSIUM: 4 mmol/L (ref 3.5–5.1)
POTASSIUM: 4.3 mmol/L (ref 3.5–5.1)
SODIUM: 137 mmol/L (ref 136–145)
SODIUM: 138 mmol/L (ref 136–145)

## 2015-12-18 LAB — CBC WITH DIFF
BASOPHIL #: 0.04 x10ˆ3/uL (ref 0.00–0.20)
BASOPHIL #: 0.05 x10ˆ3/uL (ref 0.00–0.20)
BASOPHIL %: 1 %
BASOPHIL %: 1 %
BASOPHIL %: 1 %
EOSINOPHIL #: 0.14 x10?3/uL (ref 0.00–0.50)
EOSINOPHIL #: 0.15 x10ˆ3/uL (ref 0.00–0.50)
EOSINOPHIL %: 3 %
EOSINOPHIL %: 3 %
HCT: 33.6 % — ABNORMAL LOW (ref 36.7–47.0)
HCT: 35.5 % — ABNORMAL LOW (ref 36.7–47.0)
HGB: 11 g/dL — ABNORMAL LOW (ref 12.5–16.3)
HGB: 11.5 g/dL — ABNORMAL LOW (ref 12.5–16.3)
HGB: 11.5 g/dL — ABNORMAL LOW (ref 12.5–16.3)
LYMPHOCYTE #: 0.94 x10ˆ3/uL — ABNORMAL LOW (ref 1.00–4.80)
LYMPHOCYTE #: 1.25 x10ˆ3/uL (ref 1.00–4.80)
LYMPHOCYTE %: 17 %
LYMPHOCYTE %: 24 %
MCH: 27.3 pg — ABNORMAL LOW (ref 27.4–33.0)
MCH: 27.3 pg — ABNORMAL LOW (ref 27.4–33.0)
MCH: 27.7 pg (ref 27.4–33.0)
MCH: 27.7 pg (ref 27.4–33.0)
MCHC: 32.3 g/dL — ABNORMAL LOW (ref 32.5–35.8)
MCHC: 32.7 g/dL (ref 32.5–35.8)
MCV: 84.6 fL (ref 78.0–100.0)
MCV: 84.8 fL (ref 78.0–100.0)
MONOCYTE #: 0.36 x10ˆ3/uL (ref 0.30–1.00)
MONOCYTE #: 0.48 x10ˆ3/uL (ref 0.30–1.00)
MONOCYTE %: 7 %
MONOCYTE %: 9 %
MPV: 8.2 fL (ref 7.5–11.5)
MPV: 8.4 fL (ref 7.5–11.5)
MPV: 8.4 fL (ref 7.5–11.5)
NEUTROPHIL #: 3.23 x10?3/uL (ref 1.50–7.70)
NEUTROPHIL #: 3.99 x10ˆ3/uL (ref 1.50–7.70)
NEUTROPHIL %: 63 %
NEUTROPHIL %: 63 %
NEUTROPHIL %: 73 %
PLATELETS: 163 10*3/uL (ref 140–450)
PLATELETS: 163 x10ˆ3/uL (ref 140–450)
PLATELETS: 186 10*3/uL (ref 140–450)
PLATELETS: 186 x10ˆ3/uL (ref 140–450)
RBC: 3.96 x10ˆ6/uL — ABNORMAL LOW (ref 4.06–5.63)
RBC: 4.2 x10ˆ6/uL (ref 4.06–5.63)
RDW: 15.9 % — ABNORMAL HIGH (ref 12.0–15.0)
RDW: 15.9 % — ABNORMAL HIGH (ref 12.0–15.0)
RDW: 16.4 % — ABNORMAL HIGH (ref 12.0–15.0)
WBC: 5.2 x10ˆ3/uL (ref 3.5–11.0)
WBC: 5.5 x10ˆ3/uL (ref 3.5–11.0)

## 2015-12-18 LAB — VENOUS BLOOD GAS
%FIO2 (VENOUS): 21 %
BASE EXCESS: 6 mmol/L — ABNORMAL HIGH (ref ?–3.0)
BICARBONATE (VENOUS): 27.8 mmol/L — ABNORMAL HIGH (ref 22.0–26.0)
PCO2 (VENOUS): 52 mmHg — ABNORMAL HIGH (ref 41.00–51.00)
PH (VENOUS): 7.4 (ref 7.31–7.41)
PO2 (VENOUS): 19 mm/Hg — CL (ref 35.0–50.0)
PO2 (VENOUS): 19 mmHg — CL (ref 35.0–50.0)

## 2015-12-18 LAB — HEPATIC FUNCTION PANEL
ALBUMIN: 3.5 g/dL (ref 3.4–4.8)
ALKALINE PHOSPHATASE: 101 U/L (ref <150)
ALT (SGPT): 7 U/L (ref <55)
AST (SGOT): 14 U/L (ref 8–48)
AST (SGOT): 14 U/L (ref 8–48)
BILIRUBIN DIRECT: 0.2 mg/dL (ref <0.3)
BILIRUBIN TOTAL: 0.6 mg/dL (ref 0.3–1.3)
PROTEIN TOTAL: 7.7 g/dL (ref 6.0–8.0)

## 2015-12-18 LAB — ECG 12-LEAD
Atrial Rate: 59 {beats}/min
Calculated P Axis: -38 degrees
Calculated P Axis: -38 degrees
Calculated R Axis: -7 degrees
Calculated T Axis: 35 degrees
PR Interval: 176 ms
QRS Duration: 106 ms
QT Interval: 422 ms
QTC Calculation: 417 ms
Ventricular rate: 59 {beats}/min
Ventricular rate: 59 {beats}/min

## 2015-12-18 LAB — PT/INR
INR: 1.12 (ref 0.80–1.20)
INR: 1.18 (ref 0.80–1.20)
PROTHROMBIN TIME: 12.6 s (ref 9.0–13.6)
PROTHROMBIN TIME: 13.3 s (ref 9.0–13.6)

## 2015-12-18 LAB — TYPE AND SCREEN
ABO/RH(D): O POS
ANTIBODY SCREEN: NEGATIVE

## 2015-12-18 LAB — POC BLOOD GLUCOSE (RESULTS): GLUCOSE, POC: 112 mg/dL — ABNORMAL HIGH (ref 70–105)

## 2015-12-18 LAB — PHOSPHORUS
PHOSPHORUS: 3.8 mg/dL (ref 2.3–4.0)
PHOSPHORUS: 3.8 mg/dL (ref 2.3–4.0)
PHOSPHORUS: 3.9 mg/dL (ref 2.3–4.0)

## 2015-12-18 LAB — TROPONIN-I
TROPONIN I: 8 ng/L (ref 0–30)
TROPONIN I: <7 ng/L (ref 0–30)

## 2015-12-18 LAB — MAGNESIUM
MAGNESIUM: 2 mg/dL (ref 1.6–2.5)
MAGNESIUM: 2.1 mg/dL (ref 1.6–2.5)

## 2015-12-18 LAB — HGA1C (HEMOGLOBIN A1C WITH EST AVG GLUCOSE)
ESTIMATED AVERAGE GLUCOSE: 126 mg/dL
HEMOGLOBIN A1C: 6 % (ref 4.0–6.0)

## 2015-12-18 MED ORDER — NICOTINE 10 MG INHALATION CARTRIDGE
10.00 mg | CARTRIDGE | RESPIRATORY_TRACT | Status: DC | PRN
Start: 2015-12-18 — End: 2015-12-19
  Administered 2015-12-18 (×2): 10 mg via RESPIRATORY_TRACT
  Filled 2015-12-18: qty 6

## 2015-12-18 MED ORDER — NICOTINE INHALER MOUTHPIECE
Freq: Once | Status: AC
Start: 2015-12-18 — End: 2015-12-18
  Filled 2015-12-18: qty 1

## 2015-12-18 MED ORDER — ENOXAPARIN 40 MG/0.4 ML SUBCUTANEOUS SYRINGE
40.0000 mg | INJECTION | SUBCUTANEOUS | Status: DC
Start: 2015-12-18 — End: 2015-12-19
  Administered 2015-12-18: 0 mg via SUBCUTANEOUS
  Filled 2015-12-18 (×2): qty 0.4

## 2015-12-18 MED ORDER — LIDOCAINE 5 % TOPICAL PATCH
1.0000 | MEDICATED_PATCH | Freq: Every day | CUTANEOUS | Status: DC
Start: 2015-12-18 — End: 2015-12-19
  Administered 2015-12-18 – 2015-12-19 (×2): 700 mg via TRANSDERMAL
  Filled 2015-12-18 (×2): qty 1

## 2015-12-18 MED ORDER — IPRATROPIUM 0.5 MG-ALBUTEROL 3 MG (2.5 MG BASE)/3 ML NEBULIZATION SOLN
3.0000 mL | INHALATION_SOLUTION | Freq: Four times a day (QID) | RESPIRATORY_TRACT | Status: DC | PRN
Start: 2015-12-18 — End: 2015-12-18

## 2015-12-18 MED ORDER — NICOTINE 14 MG/24 HR DAILY TRANSDERMAL PATCH
14.0000 mg | MEDICATED_PATCH | Freq: Every day | TRANSDERMAL | Status: DC
Start: 2015-12-18 — End: 2015-12-19
  Administered 2015-12-18 – 2015-12-19 (×2): 14 mg via TRANSDERMAL
  Filled 2015-12-18 (×2): qty 1

## 2015-12-18 MED ADMIN — sodium chloride 0.9 % (flush) injection syringe: ORAL | @ 21:00:00

## 2015-12-18 NOTE — Progress Notes (Addendum)
[facility]  Medicine Progress Note  Full Code    Todd BogusDennis D. Hampton  Date of service: 12/18/2015    Subjective: Patient with no chest pain, shortness of breath, fevers, or nausea today. Requesting that he be evaluated by vascular surgery for his AAA.    Vital Signs:  Temp (24hrs) Max:36.9 C (98.4 F)      Systolic (24hrs), Avg:128 , Min:118 , Max:142     Diastolic (24hrs), Avg:72, Min:58, Max:86    Temp  Avg: 36.7 C (98 F)  Min: 36.2 C (97.2 F)  Max: 36.9 C (98.4 F)  Pulse  Avg: 56  Min: 48  Max: 63  Resp  Avg: 17.6  Min: 16  Max: 20  SpO2  Avg: 94.8 %  Min: 90 %  Max: 98 %  Pain Score (Numeric, Faces): 9  Fi02    I/O:  I/O last 24 hours:  No intake or output data in the 24 hours ending 12/18/15 1756  I/O current shift:         Current Facility-Administered Medications:  albuterol 90 mcg per inhalation oral inhaler - "Nursing to administer" 2 Puff Inhalation Q6H PRN   aspirin (ECOTRIN) enteric coated tablet 81 mg 81 mg Oral Daily   atorvastatin (LIPITOR) tablet 40 mg Oral QPM   escitalopram (LEXAPRO) tablet 10 mg Oral Daily   heparin 5,000 unit/mL injection 5,000 Units Subcutaneous Q8HRS   HYDROcodone-acetaminophen (NORCO) 5-325 mg per tablet 1 Tab Oral Q6H PRN   lidocaine (LIDODERM) 5% patch 1 Patch Transdermal Daily   lisinopril (PRINIVIL) tablet 5 mg Oral Daily   metoprolol tartrate (LOPRESSOR) tablet 25 mg Oral 2x/day   mirtazapine (REMERON) tablet 15 mg Oral NIGHTLY   nicotine (NICODERM CQ) transdermal patch (mg/24 hr) 14 mg Transdermal Daily   nicotine (NICOTROL) 10 mg oral inhalation cartridge 10 mg Inhalation Q2H PRN   nitroGLYCERIN (NITROSTAT) sublingual tablet 0.4 mg Sublingual Q5 Min PRN   NS flush syringe 2 mL Intracatheter Q8HRS   And      NS flush syringe 2-6 mL Intracatheter Q1 MIN PRN   tiotropium (SPIRIVA HANDIHALER) inhalational device - "Nursing to administer"  Oral Once   And      tiotropium bromide (SPIRIVA HANDIHALER) 18 mcg cap - "Nursing to administer" 18 mcg Inhalation Daily    tiZANidine (ZANAFLEX) tablet 4 mg Oral 3x/day       Allergies   Allergen Reactions    Ibuprofen Hives/ Urticaria       Physical Exam:  General: no acute distress, alert and appears stated age  Eyes: Conjunctiva clear., Pupils equal and round.   Neck: supple, symmetrical, trachea midline  Lungs: clear to auscultation bilaterally.   Cardiovascular: Heart regular rate and rhythm, S1, S2 normal, no murmur, click, rub or gallop  Abdomen: soft, non-tender, bowel sounds normal and non-distended  Extremities: No cyanosis or LE edema  Skin: Skin warm and dry  Neurologic: grossly normal, CN II - XII grossly intact and alert and oriented x3  Psychiatric: affect normal      Labs:  CBC   Recent Labs      12/17/15   2359  12/18/15   0507   WBC  5.5  5.2   HGB  11.5*  11.0*   HCT  35.5*  33.6*   PLTCNT  186  163      Diff   Recent Labs      12/17/15   2359  12/18/15   0507   PMNS  73  63   LYMPHOCYTES  17  24   MONOCYTES  7  9   EOSINOPHIL  3  3   BASOPHILS  1   0.04  1   0.05   PMNABS  3.99  3.23   LYMPHSABS  0.94*  1.25   EOSABS  0.14  0.15   MONOSABS  0.36  0.48        BMP   Recent Labs      12/17/15   2358  12/18/15   0507   SODIUM  138  137   POTASSIUM  4.0  4.3   CHLORIDE  104  104   CO2  28  28   BUN  17  15   CREATININE  0.94  0.81   GLUCOSENF  104  96   ANIONGAP  6  5   BUNCRRATIO  18  19   GFR  >59  >59        Most Recent Cardiac Markers:    Recent Labs      12/18/15   0507   TROPONINI  8       Radiology:  CT Angio abdomen/pelvis (OSH):  Impression:  1. 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  4. 6mm left lower lobe pulmonary nodule, previously 5mm.    Microbiology:  N/A    PT/OT: No    Consults: Vascular Surgery    Hardware (lines,  foley's, tubes): PIV    Assessment/ Plan:   Active Hospital Problems    Diagnosis    Chest pain     Todd Hampton is a 70 y.o. male who presents with a short episode of chest pain that he said only lasted a few minutes. His EKG and troponins at outside hospital and here have been negative no concerning for ACS. He also has a history of AAA measuring 5 cm that we are working to obtain outside records.     Chest Pain - resolved   CAD  - TIMI 3 (Age, RF and ASA)  - EKG without contiguous leads with ST-T wave elevations or depressions. Troponin negative x 2 here, x3 at prior hospital  - Continue telemetry  - Nitro and EKG PRN chest pain  - Continue ASA 81 mg  - Obtaining recent [facility] records from PCP    AAA measuring 5 cm  - Per patient, he was supposed to have surgery to repair this last month hospital was not able to complete this.   - Increase of AAA from 3.9 to 5.4cm (2014 to 10/2015)  - Vascular surgery consulted, appreciate recs    COPD  - Albuterol and Spiriva ordered  - Continueduo-nebs PRN    HTN   HLD  - Normotensive on admission  - Continue Lisinopril 5 mg and Metoprolol 25 mg BID  - Continue Atorvastatin 40 mg daily    Chronic Neck Pain   Muscle Spasms  - Patient has history of cervical neck surgery with which he has chronic pain and muscle spasms  - Started lidocaine patch  - Continue Norco 5 q6h  - Continue tizanidine    Tobacco Abuse   - Patient currently not ready to quit but has thought about it more recently. Nicotine patch and inhaler.  Depression   Anxiety  - Continue lexapro and Remeron    DVT/PE Prophylaxis: Heparin    Disposition Planning: Home discharge      [name], MD  12/18/2015, 17:58  PGY-1, Department of Internal Medicine  [facility]  Pager [phone]      I saw and examined the patient.  I reviewed the resident's note.  I agree with the findings and plan of care as documented in the resident's note.  Any exceptions/additions are  edited/noted.    Katina Dung, MD

## 2015-12-18 NOTE — Care Plan (Signed)
Problem: General Plan of Care(Adult,OB)  Goal: Plan of Care Review(Adult,OB)  The patient and/or their representative will communicate an understanding of their plan of care   Outcome: Ongoing (see interventions/notes)  Patient will return home with his brother upon discharge, transport via brother.

## 2015-12-18 NOTE — Care Management Notes (Signed)
[facility]  Care Management Initial Evaluation    Patient Name: Todd Hampton  Date of Birth: 1945-12-30  Sex: male  Date/Time of Admission: 12/17/2015 10:34 PM  Room/Bed: 722/A  Payor: MEDICARE / Plan: MEDICARE PART A / Product Type: Medicare /   PCP: No Established Pcp    Pharmacy Info:   Preferred Pharmacy     None        Emergency Contact Info:   Extended Emergency Contact Information  Primary Emergency Contact: [facility] CtAMRICK,JOHN  Address: [address]           [place], [place] [id number] [place]  Mobile Phone: [phone]  Relation: Brother    History:   Todd Hampton is a 70 y.o., male, admitted with Chest Pain.    Height/Weight: 172.7 cm (5\' 8" ) / 59.7 kg (131 lb 9.8 oz)     LOS: 1 day   Admitting Diagnosis: Chest pain [R07.9]  Chest pain [R07.9]    Assessment:      12/18/15 1054   Assessment Details   Assessment Type Admission   Date of Care Management Update 12/18/15   Date of Next DCP Update 12/19/15   Readmission   Is this a readmission? No   Care Management Plan   Discharge Planning Status initial meeting   Projected Discharge Date 12/19/15   CM will evaluate for rehabilitation potential no   Patient choice offered to patient/family no   Form for patient choice reviewed/signed and on chart no   Patient aware of possible cost for ambulance transport?  No   Discharge Needs Assessment   Equipment Currently Used at Home none   Equipment Needed After Discharge none   Discharge Facility/Level Of Care Needs Home (Patient/Family Member/other)(code 1)   Transportation Available car;family or friend will provide   Referral Information   Admission Type observation   Address Verified verified-no changes   Arrived From home or self-care   Insurance Verified verified-no change   Observation Form   Observation Form Given MOON form given   MOON form given to Patient   MOON form date of delivery 12/18/15   MOON form time of delivery [phone]   ADVANCE DIRECTIVES   Does the Patient have an  Advance Directive? No, Information Offered and Refused   Patient Requests Assistance in Having Advance Directive Notarized. N/A   Employment/Financial   Patient has Prescription Coverage?  Yes   Living Environment   Lives With child(ren), adult  (visiting/staying with [place] in [place]   Living Arrangements mobile home   Able to Return to Prior Living Arrangements yes   Home Safety   Home Assessment: No Problems Identified   Home Accessibility no concerns   Todd Hampton is a 70 y.o. male who presents with chest pain yesterday AM.    Discharge Plan:  Home (Patient/Family Member/other) (code 1)  Patient is visiting/staying with [place] brother [place]. No DME needs prior to admission. Patient's brother will transport the patient home upon discharge. Patient appointed his brother Todd Hampton as Arts administratorLay Caregiver.     The patient will continue to be evaluated for developing discharge needs.     Case Manager: Heber Carolinaaniel Knox Holdman, LGSW  Phone: [phone]

## 2015-12-18 NOTE — Nurses Notes (Signed)
PAtient states he is going to take his own pain medication if his isnt increased. He says it is in the car. Let service know they want the patch applied then will re access

## 2015-12-18 NOTE — Care Plan (Signed)
Problem: General Plan of Care(Adult,OB)  Goal: Plan of Care Review(Adult,OB)  The patient and/or their representative will communicate an understanding of their plan of care   Outcome: Ongoing (see interventions/notes)  Patient alert and oriented x 4 throughout shift, cooperative with care. No c/o pain this shift. No falls this shift, hourly rounding provided. Assessment per doc flow. Wheels locked, bed in lowest position. Call bell in reach at all times, will continue to monitor.         Problem: Pain, Acute (Adult)  Goal: Acceptable Pain Control/Comfort Level  Patient will demonstrate the desired outcomes by discharge/transition of care.   Outcome: Ongoing (see interventions/notes)    Problem: Depression (Adult,Obstetrics,Pediatric)  Goal: Improved/Stable Mood  Patient will demonstrate the desired outcomes by discharge/transition of care.   Outcome: Ongoing (see interventions/notes)

## 2015-12-18 NOTE — Care Management Notes (Signed)
Utilization Review Determination    SECTION I  Reason for Physician Advisor Referral: Does not meet inpatient criteria. 12/17/15    Current MERLIN MD Order: inpatient    Utilization Review Findings: Patient meets observation status.  Utilization Review MD: Dr. Silvio ClaymanKaren Clark    Based on clinical information available to me as per above and in chart, the patient's status should be: Observation    Eden LatheKaren Genese Quebedeaux, CLINICAL CARE COORDINATOR 12/18/2015, 08:19  -------------------------------------------------------------------------------------------------------------------  1.  I have reviewed this case with the patient's treating physician S. Sankineni MD, and the MD agrees with this change in status.  They are aware of the referral to the Utilization Review Committee.    2.  The patient will be notified by [facility] DCP on 12/18/2015 of changes in level of care.  Please refer to the education documentation and/or [employer] for specific time of delivery.    Eden LatheKaren Maynor Mwangi, CLINICAL CARE COORDINATOR 12/18/2015, 08:19  (Patient notification is required only if the status is changed while an Inpatient to Observation Status)  -------------------------------------------------------------------------------------------------------------------

## 2015-12-18 NOTE — Nurses Notes (Signed)
PAtient bradying down to 40's and having PVS.   is sleeping  Text paged servcie

## 2015-12-18 NOTE — Nurses Notes (Signed)
Had Norco at 750 and is now rating pain 8/10 text paged service

## 2015-12-19 DIAGNOSIS — R6889 Other general symptoms and signs: Secondary | ICD-10-CM

## 2015-12-19 LAB — BASIC METABOLIC PANEL
ANION GAP: 5 mmol/L (ref 4–13)
BUN/CREA RATIO: 20 (ref 6–22)
BUN: 16 mg/dL (ref 8–25)
CALCIUM: 9.3 mg/dL (ref 8.5–10.4)
CHLORIDE: 104 mmol/L (ref 96–111)
CHLORIDE: 104 mmol/L (ref 96–111)
CO2 TOTAL: 28 mmol/L (ref 22–32)
CREATININE: 0.79 mg/dL (ref 0.62–1.27)
ESTIMATED GFR: >59 mL/min/1.73m?2 (ref >59)
GLUCOSE: 109 mg/dL (ref 65–139)
POTASSIUM: 4 mmol/L (ref 3.5–5.1)
SODIUM: 137 mmol/L (ref 136–145)

## 2015-12-19 LAB — CBC WITH DIFF
BASOPHIL #: 0.06 x10ˆ3/uL (ref 0.00–0.20)
BASOPHIL %: 1 %
EOSINOPHIL #: 0.15 x10ˆ3/uL (ref 0.00–0.50)
EOSINOPHIL %: 2 %
HCT: 33.7 % — ABNORMAL LOW (ref 36.7–47.0)
HGB: 11.1 g/dL — ABNORMAL LOW (ref 12.5–16.3)
LYMPHOCYTE #: 1.04 x10ˆ3/uL (ref 1.00–4.80)
LYMPHOCYTE %: 17 %
MCH: 27.7 pg (ref 27.4–33.0)
MCHC: 32.9 g/dL (ref 32.5–35.8)
MCV: 84 fL (ref 78.0–100.0)
MONOCYTE #: 0.45 x10ˆ3/uL (ref 0.30–1.00)
MONOCYTE %: 7 %
MPV: 8.3 fL (ref 7.5–11.5)
NEUTROPHIL #: 4.52 x10?3/uL (ref 1.50–7.70)
NEUTROPHIL %: 73 %
PLATELETS: 169 x10ˆ3/uL (ref 140–450)
RBC: 4.01 x10ˆ6/uL — ABNORMAL LOW (ref 4.06–5.63)
RDW: 16.1 % — ABNORMAL HIGH (ref 12.0–15.0)
WBC: 6.2 x10ˆ3/uL (ref 3.5–11.0)

## 2015-12-19 LAB — PHOSPHORUS: PHOSPHORUS: 3.3 mg/dL (ref 2.3–4.0)

## 2015-12-19 LAB — MAGNESIUM: MAGNESIUM: 2 mg/dL (ref 1.6–2.5)

## 2015-12-19 NOTE — Progress Notes (Addendum)
[facility]  Medicine Progress Note  Full Code    Arman BogusDennis D. Carbajal  Date of service: 12/19/2015    Subjective: No acute events overnight. Denies chest pain, shortness of breath, fever, or abdominal pain. Patient feels like he is ready to go home.    Vital Signs:  Temp (24hrs) Max:36.8 C (98.2 F)      Systolic (24hrs), Avg:115 , Min:103 , Max:123     Diastolic (24hrs), Avg:67, Min:58, Max:78    Temp  Avg: 36.6 C (97.9 F)  Min: 36.2 C (97.2 F)  Max: 36.8 C (98.2 F)  Pulse  Avg: 59.2  Min: 48  Max: 68  Resp  Avg: 18.8  Min: 18  Max: 20  SpO2  Avg: 95.4 %  Min: 93 %  Max: 98 %  Pain Score (Numeric, Faces): 8  Fi02    I/O:  I/O last 24 hours:      Intake/Output Summary (Last 24 hours) at 12/19/15 0842  Last data filed at 12/19/15 0700   Gross per 24 hour   Intake                0 ml   Output                0 ml   Net                0 ml     I/O current shift:         Current Facility-Administered Medications:  albuterol 90 mcg per inhalation oral inhaler - "Nursing to administer" 2 Puff Inhalation Q6H PRN   aspirin (ECOTRIN) enteric coated tablet 81 mg 81 mg Oral Daily   atorvastatin (LIPITOR) tablet 40 mg Oral QPM   enoxaparin PF (LOVENOX) 40 mg/0.4 mL SubQ injection 40 mg Subcutaneous Q24H   escitalopram (LEXAPRO) tablet 10 mg Oral Daily   HYDROcodone-acetaminophen (NORCO) 5-325 mg per tablet 1 Tab Oral Q6H PRN   lidocaine (LIDODERM) 5% patch 1 Patch Transdermal Daily   lisinopril (PRINIVIL) tablet 5 mg Oral Daily   metoprolol tartrate (LOPRESSOR) tablet 25 mg Oral 2x/day   mirtazapine (REMERON) tablet 15 mg Oral NIGHTLY   nicotine (NICODERM CQ) transdermal patch (mg/24 hr) 14 mg Transdermal Daily   nicotine (NICOTROL) 10 mg oral inhalation cartridge 10 mg Inhalation Q2H PRN   nitroGLYCERIN (NITROSTAT) sublingual tablet 0.4 mg Sublingual Q5 Min PRN   NS flush syringe 2 mL Intracatheter Q8HRS   And      NS flush syringe 2-6 mL Intracatheter Q1 MIN PRN   tiotropium (SPIRIVA HANDIHALER) inhalational  device - "Nursing to administer"  Oral Once   And      tiotropium bromide (SPIRIVA HANDIHALER) 18 mcg cap - "Nursing to administer" 18 mcg Inhalation Daily   tiZANidine (ZANAFLEX) tablet 4 mg Oral 3x/day       Allergies   Allergen Reactions    Ibuprofen Hives/ Urticaria       Physical Exam:  General: no acute distress, alert and appears stated age  Eyes: Conjunctiva clear., Pupils equal and round.   Neck: supple, symmetrical, trachea midline  Lungs: clear to auscultation bilaterally.   Cardiovascular: Heart regular rate and rhythm, S1, S2 normal, no murmur, click, rub or gallop  Abdomen: soft, non-tender, bowel sounds normal and non-distended  Extremities: No cyanosis or LE edema  Skin: Skin warm and dry  Neurologic: grossly normal, CN II - XII grossly intact and alert and oriented x3  Psychiatric: affect normal  Labs:  CBC   Recent Labs      12/17/15   2359  12/18/15   0507  12/19/15   0507   WBC  5.5  5.2  6.2   HGB  11.5*  11.0*  11.1*   HCT  35.5*  33.6*  33.7*   PLTCNT  186  163  169      Diff   Recent Labs      12/17/15   2359  12/18/15   0507  12/19/15   0507   PMNS  73  63  73   LYMPHOCYTES  MONOCYTES  EOSINOPHIL  BASOPHILS  1   0.04  1   0.05  1   0.06   PMNABS  3.99  3.23  4.52   LYMPHSABS  0.94*  1.25  1.04   EOSABS  0.14  0.15  0.15   MONOSABS  0.36  0.48  0.45        BMP   Recent Labs      12/17/15   2358  12/18/15   0507  12/19/15   0507   SODIUM  138  137  137   POTASSIUM  4.0  4.3  4.0   CHLORIDE  104  104  104   CO2  BUN  CREATININE  0.94  0.81  0.79   GLUCOSENF  104  96  109   ANIONGAP  BUNCRRATIO  GFR  >59  >59  >59        Most Recent Cardiac Markers:    No results found for this encounter    Radiology:  CT Angio abdomen/pelvis (OSH):  Impression:  1. 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  4. 6mm left lower lobe pulmonary nodule, previously 5mm.    Microbiology:  N/A    PT/OT: No    Consults: Vascular Surgery    Hardware (lines, foley's, tubes): PIV    Assessment/ Plan:   Active Hospital Problems    Diagnosis    Chest pain     Adalid Beckmann. Garmany is a 70 y.o. male who presents with a short episode of chest pain that he said only lasted a few minutes. His EKG and troponins at outside hospital and here have been negative no concerning for ACS. He is stable for discharge today with follow up with his home vascular surgeon for his AAA.    Chest Pain - resolved   CAD  - TIMI 3 (Age, RF and ASA)  - EKG without contiguous leads with ST-T wave elevations or depressions. Troponin negative x 2 here, x3 at prior hospital  - No events on telemetry  - Nitro and EKG PRN chest pain  - Continue ASA 81 mg  - Will follow up with his PCP, Suzann Hedgecock PA-C    AAA measuring 5 cm  - Per patient, he was supposed to have surgery to repair this last month hospital was not able to complete this.   - Increase  of AAA from 3.9 to 5.4cm (2014 to 10/2015)  - Vascular surgery consulted, given no symptoms there is no acute intervention necessary. Already scheduled for surgery on 01/04/16, service recommended he follow up with his home vascular surgeon    COPD  - Albuterol and Spiriva ordered  - Continueduo-nebs PRN    HTN   HLD  - Normotensive on admission  - Continue Lisinopril 5 mg and Metoprolol 25 mg BID  - Continue Atorvastatin 40 mg daily    Chronic Neck Pain   Muscle Spasms  - Patient has history of cervical neck surgery with which he has chronic pain and muscle spasms  - Started lidocaine patch  - Continue Norco 5 q6h  - Continue tizanidine    Tobacco Abuse   - Patient currently not ready to quit but has thought about it more recently. Nicotine patch and  inhaler.    Depression   Anxiety  - Continue lexapro and Remeron    DVT/PE Prophylaxis: Heparin    Disposition Planning: Home discharge      [name], MD  12/19/2015, 08:43  PGY-1, Department of Internal Medicine  [facility]  Pager [phone]        Patient left prior to rounds  I reviewed the resident's note.  I agree with the findings and plan of care as documented in the resident's note.  Any exceptions/additions are edited/noted.    Katina Dung, MD

## 2015-12-19 NOTE — Consults (Addendum)
[facility]  Vascular Surgery Initial Consult    Todd Hampton,Todd D., 70 y.o. male  Date of Birth:  1946-01-03  Date of service: 12/19/2015    Information Obtained from: patient, health care provider and history reviewed via medical record  Chief Complaint:   1. Evaluation of Asymptomatic AAA    PCP: No Established Pcp  Consult Requested By: Medicine 3     HPI: (must include no less than 4 of the following main descriptors) Location (of pain): Quality (character of pain) Severity (minimal, mild, severe, scale or 1-10) Duration (how long has pain/sx present) Timing (when does pain/sx occur)  Context (activity at/before onset) Modifying Factors (what makes pain/sx  Better/worse) Associate Sign/Sx (what accompanies main pain/sx)     Todd Bogus. Hampton is a 70 y.o., male with a PMH of CAD (requiring stent x 2, [place], [place]), COPD, HTN, HLD, and known AAA who presented to [facility] as a transfer from [facility] for CP rule out. Patient was driving home to [place] from visiting his brother when his CP began. He also required resuscitation for an arrhythmia requiring defibrillation. Work up was done and thus far has been negative. Patient reports a history ofa 5 cm AAA for which he follows with Vascular Surgery at [facility] in [place]. Patient is asymptomatic, and AAA was found on screening abdominal U/S 2 months ago. Patient was actually scheduled to undergo EVAR recently, but surgery was rescheduled. He is currently scheduled for surgery on 4/28 per his report.  Denies any systemic symptoms, including CP (which is now resolved) and dyspnea.  No other concerns.     ROS:  MUST comment on all "Abnormal" findings   ROS Other than ROS in the HPI, all other systems were negative.    PAST MEDICAL/ FAMILY/ SOCIAL HISTORY:       Past Medical History:   Diagnosis Date    Aneurysm of abdominal aorta (HCC)     Anxiety     CAD (coronary artery disease)     COPD (chronic obstructive pulmonary disease) (HCC)      Depression     HLD (hyperlipidemia)     HTN (hypertension)     Muscle spasms of neck      Allergies   Allergen Reactions    Ibuprofen Hives/ Urticaria     Medications Prior to Admission     Prescriptions    albuterol sulfate (PROVENTIL OR VENTOLIN OR PROAIR) 90 mcg/actuation Inhalation HFA Aerosol Inhaler    Take 1-2 Puffs by inhalation Every 6 hours as needed    aspirin (ECOTRIN) 81 mg Oral Tablet, Delayed Release (E.C.)    Take 81 mg by mouth Once a day    atorvastatin (LIPITOR) 40 mg Oral Tablet    Take 40 mg by mouth Every evening    escitalopram oxalate (LEXAPRO) 10 mg Oral Tablet    Take 10 mg by mouth Once a day    HYDROcodone-acetaminophen (NORCO) 5-325 mg Oral Tablet    Take 1 Tab by mouth Every 6 hours as needed for Pain    lisinopril (PRINIVIL) 5 mg Oral Tablet    Take 5 mg by mouth Once a day    metoprolol (LOPRESSOR) 25 mg Oral Tablet    Take 25 mg by mouth Twice daily    mirtazapine (REMERON) 15 mg Oral Tablet    Take 15 mg by mouth Every night    nitroGLYCERIN (NITROSTAT) 0.4 mg Sublingual Tablet, Sublingual    0.4 mg by  Sublingual route Every 5 minutes as needed for Chest pain for 3 doses over 15 minutes    tiotropium bromide (SPIRIVA HANDIHALER) 18 mcg Inhalation Capsule, w/Inhalation Device    Take 18 mcg by inhalation Once a day    tiZANidine (ZANAFLEX) 4 mg Oral Tablet    Take 4 mg by mouth Three times a day           Current Facility-Administered Medications:  albuterol 90 mcg per inhalation oral inhaler - "Nursing to administer" 2 Puff Inhalation Q6H PRN   aspirin (ECOTRIN) enteric coated tablet 81 mg 81 mg Oral Daily   atorvastatin (LIPITOR) tablet 40 mg Oral QPM   enoxaparin PF (LOVENOX) 40 mg/0.4 mL SubQ injection 40 mg Subcutaneous Q24H   escitalopram (LEXAPRO) tablet 10 mg Oral Daily   HYDROcodone-acetaminophen (NORCO) 5-325 mg per tablet 1 Tab Oral Q6H PRN   lidocaine (LIDODERM) 5% patch 1 Patch Transdermal Daily   lisinopril (PRINIVIL) tablet 5 mg Oral Daily   metoprolol tartrate  (LOPRESSOR) tablet 25 mg Oral 2x/day   mirtazapine (REMERON) tablet 15 mg Oral NIGHTLY   nicotine (NICODERM CQ) transdermal patch (mg/24 hr) 14 mg Transdermal Daily   nicotine (NICOTROL) 10 mg oral inhalation cartridge 10 mg Inhalation Q2H PRN   nitroGLYCERIN (NITROSTAT) sublingual tablet 0.4 mg Sublingual Q5 Min PRN   NS flush syringe 2 mL Intracatheter Q8HRS   And      NS flush syringe 2-6 mL Intracatheter Q1 MIN PRN   tiotropium (SPIRIVA HANDIHALER) inhalational device - "Nursing to administer"  Oral Once   And      tiotropium bromide (SPIRIVA HANDIHALER) 18 mcg cap - "Nursing to administer" 18 mcg Inhalation Daily   tiZANidine (ZANAFLEX) tablet 4 mg Oral 3x/day     Past Surgical History:   Procedure Laterality Date    HX CERVICAL SPINE SURGERY      HX HERNIA REPAIR       Family History   Problem Relation Age of Onset    Cancer Father      Bone marrow cancer     Social History   Substance Use Topics    Smoking status: Current Every Day Smoker     Packs/day: 1.00     Years: 40.00     Types: Cigarettes    Smokeless tobacco: None    Alcohol use No     PHYSICAL EXAMINATION: MUST comment on all "Abnormal" findings    Exam Temperature: 36.8 C (98.2 F)  Heart Rate: 60  BP (Non-Invasive): 112/73  Respiratory Rate: 18  SpO2-1: 95 %  Pain Score (Numeric, Faces): 8  General: no distress  Eyes: Conjunctiva clear.  HENT:Head atraumatic and normocephalic  Neck: supple, symmetrical, trachea midline  Lungs: Clear to auscultation bilaterally.   Cardiovascular: regular rate and rhythm  Abdomen: Soft, non-tender, pulsatile area just left of umbilicus  Extremities: No cyanosis or edema  Skin: Skin warm and dry  Neurologic: Alert and oriented x3  Lymphatics: No lymphadenopathy  Psychiatric: Normal affect, behavior, memory, thought content, judgement, and speech.    Labs Ordered/ Reviewed (Please indicate ordered or reviewed)   Reviewed: Labs:  Lab Results for Last 24 Hours:    Results for orders placed or performed during the  hospital encounter of 12/17/15 (from the past 24 hour(s))   BASIC METABOLIC PANEL   Result Value Ref Range    SODIUM 137 136 - 145 mmol/L    POTASSIUM 4.3 3.5 - 5.1 mmol/L    CHLORIDE 104 96 -  111 mmol/L    CO2 TOTAL 28 22 - 32 mmol/L    ANION GAP 5 4 - 13 mmol/L    CALCIUM 9.2 8.5 - 10.4 mg/dL    GLUCOSE 96 65 - 562 mg/dL    BUN 15 8 - 25 mg/dL    CREATININE 1.30 8.65 - 1.27 mg/dL    BUN/CREA RATIO 19 6 - 22    ESTIMATED GFR >59 >59 mL/min/1.2m2   MAGNESIUM   Result Value Ref Range    MAGNESIUM 2.1 1.6 - 2.5 mg/dL   PHOSPHORUS   Result Value Ref Range    PHOSPHORUS 3.8 2.3 - 4.0 mg/dL   PT/INR   Result Value Ref Range    PROTHROMBIN TIME 13.3 9.0 - 13.6 seconds    INR 1.18 0.80 - 1.20   CBC WITH DIFF   Result Value Ref Range    WBC 5.2 3.5 - 11.0 x103/uL    RBC 3.96 (L) 4.06 - 5.63 x106/uL    HGB 11.0 (L) 12.5 - 16.3 g/dL    HCT 78.4 (L) 69.6 - 47.0 %    MCV 84.8 78.0 - 100.0 fL    MCH 27.7 27.4 - 33.0 pg    MCHC 32.7 32.5 - 35.8 g/dL    RDW 29.5 (H) 28.4 - 15.0 %    PLATELETS 163 140 - 450 x103/uL    MPV 8.2 7.5 - 11.5 fL    NEUTROPHIL % 63 %    LYMPHOCYTE % 24 %    MONOCYTE % 9 %    EOSINOPHIL % 3 %    BASOPHIL % 1 %    NEUTROPHIL # 3.23 1.50 - 7.70 x103/uL    LYMPHOCYTE # 1.25 1.00 - 4.80 x103/uL    MONOCYTE # 0.48 0.30 - 1.00 x103/uL    EOSINOPHIL # 0.15 0.00 - 0.50 x103/uL    BASOPHIL # 0.05 0.00 - 0.20 x103/uL   TROPONIN-I   Result Value Ref Range    TROPONIN I 8 0 - 30 ng/L   POC BLOOD GLUCOSE (RESULTS)   Result Value Ref Range    GLUCOSE, POC 112 (H) 70 - 105 mg/dL     Ordered: None    Radiology Tests Ordered/ Reviewed (Please indicate ordered or reviewed)   Reviewed:   None      IMPRESSION:      70 yo M with known AAA, about 5 cm, who follows with Vascular Surgery and is currently scheduled for Surgery    Recommendations (if Consult)     No acute intervention by [facility]    Patient is currently asymptomatic    Please have patient follow up with his home Vascular Surgeon as he lives in  [place]    [place] B. Crigger, MD  General Surgery  [phone] Pager  03:11 12/19/2015    Kandice Hams, MD  12/19/2015, 13:10

## 2015-12-19 NOTE — Discharge Summary (Addendum)
DISCHARGE SUMMARY      PATIENT NAMMelynda Hampton:  Hampton,Todd D.  MRN:  [id number]  DOB:  November 02, 1945    ADMISSION DATE:  12/17/2015  DISCHARGE DATE:  12/19/2015    ATTENDING PHYSICIAN: Katina DungNathan Emylie Amster, MD  SERVICE: MEDICINE 3  PRIMARY CARE PHYSICIAN: Marlene LardSuzann B Hedgecock     Reason for Admission     Diagnosis        Chest pain [125822]    Chest pain [125822]          DISCHARGE DIAGNOSIS:   Principle Problem: <principal problem not specified>  Active Hospital Problems    Diagnosis Date Noted    Chest pain 12/17/2015      Resolved Hospital Problems    Diagnosis    No resolved problems to display.     There are no active non-hospital problems to display for this patient.     Allergies   Allergen Reactions    Ibuprofen Hives/ Urticaria       DISCHARGE MEDICATIONS:     Current Discharge Medication List      CONTINUE these medications - NO CHANGES were made during your visit.       Details    albuterol sulfate 90 mcg/actuation HFA Aerosol Inhaler   Commonly known as:  PROVENTIL or VENTOLIN or PROAIR    1-2 Puffs, Inhalation, Q6H PRN   Refills:  0       aspirin 81 mg Tablet, Delayed Release (E.C.)   Commonly known as:  ECOTRIN    81 mg, Oral, Daily   Refills:  0       atorvastatin 40 mg Tablet   Commonly known as:  LIPITOR    40 mg, Oral, QPM   Refills:  0       escitalopram oxalate 10 mg Tablet   Commonly known as:  LEXAPRO    10 mg, Oral, Daily   Refills:  0       HYDROcodone-acetaminophen 5-325 mg Tablet   Commonly known as:  NORCO    1 Tab, Oral, Q6H PRN   Refills:  0       lisinopril 5 mg Tablet   Commonly known as:  PRINIVIL    5 mg, Oral, Daily   Refills:  0       metoprolol 25 mg Tablet   Commonly known as:  LOPRESSOR    25 mg, Oral, 2x/day   Refills:  0       mirtazapine 15 mg Tablet   Commonly known as:  REMERON    15 mg, Oral, NIGHTLY   Refills:  0       nitroGLYCERIN 0.4 mg Tablet, Sublingual   Commonly known as:  NITROSTAT    0.4 mg, Sublingual, Q5 Min PRN, for 3 doses over 15 minutes    Refills:  0       tiotropium  bromide 18 mcg Capsule, w/Inhalation Device   Commonly known as:  SPIRIVA HANDIHALER    18 mcg, Inhalation, Daily   Refills:  0       tiZANidine 4 mg Tablet   Commonly known as:  ZANAFLEX    4 mg, Oral, 3x/day   Refills:  0           Discharge med list refreshed?  YES    During this hospitalization did the patient have an AMI, PCI/PCTA, STENT or Isolated CABG?  No                DISCHARGE INSTRUCTIONS:  No discharge procedures on file.       REASON FOR HOSPITALIZATION AND HOSPITAL COURSE:  Todd Hampton. Runde is a 70 y.o. male who presented with chest pain. He was sitting at home when he started having pain in his chest that eventually resolved on it's own after a few seconds without any medications. He went into [facility]. His EKG did not show ST-T wave changes and troponin was 10. He has a history of CAD, requiring 2 stents last year (in [place], [place]). A year or two prior to this he had to be resuscitated after he "had an arrhythmia that required defibrillation." He has no other cardiac history.     Patient had a history of abdominal aortic aneurysm measuring around 5 cm that he has been seeing Vascular Surgery at [facility] in [place], [place]. He was supposed to be operated on last month but states that a machine they needed was not working and he was asked to come back at a later date. His brother suggested that since he also had a history of this aneurysm, it may be of benefit to come to [facility] to have this evaluated also for possible surgical intervention.    While admitted to the hospital, he remained asymptomatic, his troponins remained negative, and there were no concerning ischemic changes on EKG. Vascular surgery was consulted to evaluate the need for an acute surgical intervention for his AAA, however they stated that he was stable and asymptomatic at this time and recommended he have the procedure done with his home vascular surgeon.    Mr. Ahr will follow up  with his PCP, [facility], upon returning to [facility] and will undergo his vascular procedure at [facility] on 01/04/16.        CONDITION ON DISCHARGE:  A. Ambulation: Full ambulation  B. Self-care Ability: Complete  C. Cognitive Status Alert and Oriented x 3  D. DNR status at discharge: Full Code    Advance Directive Information       Most Recent Value    Does the Patient have an Advance Directive? No, Information Offered and Refused          DISCHARGE DISPOSITION:  Home discharge          Pending/Ordered Tests/Procedures:  N/A    Pending/Ordered Referrals/Follow-up:  [facility] for AAA (01/04/16)    [name], MD  12/19/2015, 08:48  PGY-1, Department of Internal Medicine  [facility] Medicine  Pager [phone]          Copies sent to Care Team       Relationship Specialty Notifications Start End    Calton Dach B PCP - General Family Practice  12/19/15     Phone: [phone] Fax: [phone]         [address] [facility] [place] [address]          Referring providers can utilize [email or web address] to access their referred [employer] patient's information.

## 2015-12-19 NOTE — Nurses Notes (Signed)
Removed both PIV cath intact dressings placed no medication changes

## 2015-12-20 ENCOUNTER — Telehealth

## 2015-12-20 NOTE — Care Management Notes (Addendum)
Care Coordinator/Social Work Plan  [place]  Patient Name: Arman BogusDENNIS D. Giovannetti   MRN: [id number]   Acct Number: [id number]  DOB: [birth year] Age: [age]  **Admission Information**  Patient Type: OBSERVATION  Admit Date: 12/17/2015 Admit Time: 22:34  Admit Reason: Chest pain, unspecified  Admitting Phys: Arnetha GulaKINCAID,CHRISTINE R  Attending Phys: Rosemarie AxLERFALD,NATHAN M  Unit: R-[id number] Bed: 722-A  Discharge Information  Actual D/C Date: 12/19/2015  Actual LOS: 2  160. LOC Notification, CMS Important Message / Detailed Notice  Created by : Cindie LarocheGernelle Rivers Date/Time 2015-12-20 13:44:58.000  Medicare Important Message/Detailed Notice  Admission- CMS-Important Message from Medicare About Your Rights (CMS-1405) IMM was delivered to the patient. A copy was  provided to the patient. (Date / Time) Patient 12/18/15 8:14am

## 2015-12-20 NOTE — Telephone Encounter (Signed)
Transitional Care Management Contact Type Phone Call Attempt Prescriptions filled? Confirmed Time/Date/Place of Appointment? Was appointment made? DME Equipment?   12/20/2015 Telephone Answer 1 N/A No No N/A      Transitional Care Management (Continued) Home Health Ordered?   12/20/2015 No       Spoke with patient who reports that he is feeling well. No complaints of cp or sob. He verbalized understanding of discharge instructions and [place] He states that he does have our contact information for questions or concerns.

## 2015-12-28 ENCOUNTER — Ambulatory Visit: Payer: Medicare Other | Admitting: Cardiovascular Disease

## 2016-01-02 ENCOUNTER — Encounter (HOSPITAL_COMMUNITY): Payer: Self-pay | Admitting: *Deleted

## 2016-01-02 ENCOUNTER — Encounter: Payer: Self-pay | Admitting: Cardiovascular Disease

## 2016-01-02 NOTE — Progress Notes (Signed)
Pt denies acute cardiopulmonary symptoms. Pt is under the care of Dr. Cathie Olden, Cardiology. Pt made aware to stop taking vitamins, fish oil, and herbal medications. Do not take any NSAIDs ie: Ibuprofen, Advil, Naproxen, BC and Goody Powder. Pt made aware to bring Albuterol inhaler in on DOS. Pt verbalized understanding of all pre-op instructions. Pt history reviewed by anesthesia ( see note).

## 2016-01-02 NOTE — Progress Notes (Addendum)
Anesthesia Chart Review:  Pt is a 70 year old male scheduled for abdominal aortic endovascular stent graft, insertion of iliac branch device (palmaz stent) on 11/29/2015 with Dr. Bridgett Larsson.  Pt is a same day work up.   Pt was originally scheduled for this surgery 11/29/15 but it was postponed due to scheduling issues.   Please see my note dated 11/26/15 for background, testing results, clearance.   Labs will be obtained DOS.   If labs acceptable DOS, I anticipate pt can proceed as scheduled.   Willeen Cass, FNP-BC [facility] Stay [facility]/Anesthesiology Phone: [phone] 01/02/2016 9:47 AM

## 2016-01-03 ENCOUNTER — Inpatient Hospital Stay (HOSPITAL_COMMUNITY): Payer: Medicare Other | Admitting: Vascular Surgery

## 2016-01-03 ENCOUNTER — Inpatient Hospital Stay (HOSPITAL_COMMUNITY): Payer: Medicare Other

## 2016-01-03 ENCOUNTER — Encounter (HOSPITAL_COMMUNITY): Payer: Self-pay | Admitting: *Deleted

## 2016-01-03 ENCOUNTER — Inpatient Hospital Stay (HOSPITAL_COMMUNITY)
Admission: RE | Admit: 2016-01-03 | Discharge: 2016-01-11 | DRG: 268 | Disposition: A | Discharge status: Home Health | Payer: Medicare Other | Source: Ambulatory Visit | Attending: Vascular Surgery | Admitting: Vascular Surgery

## 2016-01-03 ENCOUNTER — Encounter (HOSPITAL_COMMUNITY)
Admission: RE | Disposition: A | Discharge status: Home Health | Payer: Self-pay | Source: Ambulatory Visit | Attending: Vascular Surgery

## 2016-01-03 DIAGNOSIS — I5042 Chronic combined systolic (congestive) and diastolic (congestive) heart failure: Secondary | ICD-10-CM | POA: Diagnosis present

## 2016-01-03 DIAGNOSIS — F419 Anxiety disorder, unspecified: Secondary | ICD-10-CM | POA: Diagnosis present

## 2016-01-03 DIAGNOSIS — D62 Acute posthemorrhagic anemia: Secondary | ICD-10-CM | POA: Diagnosis not present

## 2016-01-03 DIAGNOSIS — I255 Ischemic cardiomyopathy: Secondary | ICD-10-CM | POA: Diagnosis present

## 2016-01-03 DIAGNOSIS — I714 Abdominal aortic aneurysm, without rupture, unspecified: Secondary | ICD-10-CM | POA: Diagnosis present

## 2016-01-03 DIAGNOSIS — F17210 Nicotine dependence, cigarettes, uncomplicated: Secondary | ICD-10-CM | POA: Diagnosis present

## 2016-01-03 DIAGNOSIS — Z79899 Other long term (current) drug therapy: Secondary | ICD-10-CM

## 2016-01-03 DIAGNOSIS — I5020 Unspecified systolic (congestive) heart failure: Secondary | ICD-10-CM | POA: Insufficient documentation

## 2016-01-03 DIAGNOSIS — J449 Chronic obstructive pulmonary disease, unspecified: Secondary | ICD-10-CM | POA: Diagnosis present

## 2016-01-03 DIAGNOSIS — Z7982 Long term (current) use of aspirin: Secondary | ICD-10-CM | POA: Diagnosis not present

## 2016-01-03 DIAGNOSIS — J9601 Acute respiratory failure with hypoxia: Secondary | ICD-10-CM | POA: Diagnosis not present

## 2016-01-03 DIAGNOSIS — I4891 Unspecified atrial fibrillation: Secondary | ICD-10-CM | POA: Diagnosis not present

## 2016-01-03 DIAGNOSIS — I110 Hypertensive heart disease with heart failure: Secondary | ICD-10-CM | POA: Diagnosis present

## 2016-01-03 DIAGNOSIS — Z955 Presence of coronary angioplasty implant and graft: Secondary | ICD-10-CM

## 2016-01-03 DIAGNOSIS — E871 Hypo-osmolality and hyponatremia: Secondary | ICD-10-CM | POA: Diagnosis present

## 2016-01-03 DIAGNOSIS — I723 Aneurysm of iliac artery: Secondary | ICD-10-CM | POA: Diagnosis present

## 2016-01-03 DIAGNOSIS — F329 Major depressive disorder, single episode, unspecified: Secondary | ICD-10-CM | POA: Diagnosis present

## 2016-01-03 DIAGNOSIS — I2510 Atherosclerotic heart disease of native coronary artery without angina pectoris: Secondary | ICD-10-CM | POA: Diagnosis present

## 2016-01-03 DIAGNOSIS — I708 Atherosclerosis of other arteries: Secondary | ICD-10-CM | POA: Diagnosis present

## 2016-01-03 DIAGNOSIS — G9340 Encephalopathy, unspecified: Secondary | ICD-10-CM | POA: Diagnosis not present

## 2016-01-03 DIAGNOSIS — I10 Essential (primary) hypertension: Secondary | ICD-10-CM | POA: Diagnosis not present

## 2016-01-03 DIAGNOSIS — I480 Paroxysmal atrial fibrillation: Secondary | ICD-10-CM | POA: Diagnosis not present

## 2016-01-03 DIAGNOSIS — Z8673 Personal history of transient ischemic attack (TIA), and cerebral infarction without residual deficits: Secondary | ICD-10-CM | POA: Diagnosis not present

## 2016-01-03 DIAGNOSIS — I252 Old myocardial infarction: Secondary | ICD-10-CM

## 2016-01-03 DIAGNOSIS — I739 Peripheral vascular disease, unspecified: Secondary | ICD-10-CM | POA: Diagnosis present

## 2016-01-03 DIAGNOSIS — F17200 Nicotine dependence, unspecified, uncomplicated: Secondary | ICD-10-CM | POA: Diagnosis not present

## 2016-01-03 DIAGNOSIS — E7800 Pure hypercholesterolemia, unspecified: Secondary | ICD-10-CM | POA: Diagnosis present

## 2016-01-03 DIAGNOSIS — Z9889 Other specified postprocedural states: Secondary | ICD-10-CM

## 2016-01-03 DIAGNOSIS — Z8679 Personal history of other diseases of the circulatory system: Secondary | ICD-10-CM | POA: Insufficient documentation

## 2016-01-03 DIAGNOSIS — J95821 Acute postprocedural respiratory failure: Secondary | ICD-10-CM | POA: Diagnosis not present

## 2016-01-03 HISTORY — PX: ABDOMINAL AORTIC ANEURYSM REPAIR: SHX42

## 2016-01-03 HISTORY — DX: Headache, unspecified: R51.9

## 2016-01-03 HISTORY — DX: Headache: R51

## 2016-01-03 HISTORY — PX: ABDOMINAL AORTIC ENDOVASCULAR STENT GRAFT: SHX5707

## 2016-01-03 HISTORY — DX: Unspecified atrial fibrillation: I48.91

## 2016-01-03 LAB — BLOOD GAS, ARTERIAL
ACID-BASE DEFICIT: 1.5 mmol/L (ref 0.0–2.0)
ACID-BASE EXCESS: 1.1 mmol/L (ref 0.0–2.0)
Bicarbonate: 25.5 mEq/L — ABNORMAL HIGH (ref 20.0–24.0)
Bicarbonate: 23 mEq/L (ref 20.0–24.0)
DRAWN BY: 449841
DRAWN BY: 406621
FIO2: 0.21
FIO2: 1
MECHVT: 510 mL
O2 SAT: 95.4 %
O2 Saturation: 99.9 %
PATIENT TEMPERATURE: 98.6
PCO2 ART: 41.1 mmHg (ref 35.0–45.0)
PEEP: 5 cmH2O
PH ART: 7.395 (ref 7.350–7.450)
PH ART: 7.367 (ref 7.350–7.450)
Patient temperature: 98.6
RATE: 14 resp/min
TCO2: 26.8 mmol/L (ref 0–100)
TCO2: 24.3 mmol/L (ref 0–100)
pCO2 arterial: 42.5 mmHg (ref 35.0–45.0)
pO2, Arterial: 75.6 mmHg — ABNORMAL LOW (ref 80.0–100.0)
pO2, Arterial: 383 mmHg — ABNORMAL HIGH (ref 80.0–100.0)

## 2016-01-03 LAB — URINE MICROSCOPIC-ADD ON

## 2016-01-03 LAB — MAGNESIUM: MAGNESIUM: 1.5 mg/dL — AB (ref 1.7–2.4)

## 2016-01-03 LAB — BASIC METABOLIC PANEL
ANION GAP: 8 (ref 5–15)
BUN: 11 mg/dL (ref 6–20)
CALCIUM: 8 mg/dL — AB (ref 8.9–10.3)
CO2: 23 mmol/L (ref 22–32)
Chloride: 103 mmol/L (ref 101–111)
Creatinine, Ser: 0.76 mg/dL (ref 0.61–1.24)
GLUCOSE: 183 mg/dL — AB (ref 65–99)
Potassium: 4.8 mmol/L (ref 3.5–5.1)
SODIUM: 134 mmol/L — AB (ref 135–145)

## 2016-01-03 LAB — COMPREHENSIVE METABOLIC PANEL
ALBUMIN: 3.4 g/dL — AB (ref 3.5–5.0)
ALT: 8 U/L — ABNORMAL LOW (ref 17–63)
ANION GAP: 8 (ref 5–15)
AST: 17 U/L (ref 15–41)
Alkaline Phosphatase: 84 U/L (ref 38–126)
BUN: 16 mg/dL (ref 6–20)
CHLORIDE: 106 mmol/L (ref 101–111)
CO2: 24 mmol/L (ref 22–32)
Calcium: 8.9 mg/dL (ref 8.9–10.3)
Creatinine, Ser: 0.7 mg/dL (ref 0.61–1.24)
GFR calc Af Amer: >60 mL/min (ref >60)
GFR calc non Af Amer: >60 mL/min (ref >60)
GLUCOSE: 111 mg/dL — AB (ref 65–99)
POTASSIUM: 4.2 mmol/L (ref 3.5–5.1)
SODIUM: 138 mmol/L (ref 135–145)
Total Bilirubin: 0.5 mg/dL (ref 0.3–1.2)
Total Protein: 7.1 g/dL (ref 6.5–8.1)

## 2016-01-03 LAB — CBC
HCT: 33 % — ABNORMAL LOW (ref 39.0–52.0)
HCT: 31.9 % — ABNORMAL LOW (ref 39.0–52.0)
Hemoglobin: 10.4 g/dL — ABNORMAL LOW (ref 13.0–17.0)
Hemoglobin: 10.1 g/dL — ABNORMAL LOW (ref 13.0–17.0)
MCH: 27.2 pg (ref 26.0–34.0)
MCH: 27.2 pg (ref 26.0–34.0)
MCHC: 31.5 g/dL (ref 30.0–36.0)
MCHC: 31.7 g/dL (ref 30.0–36.0)
MCV: 86.4 fL (ref 78.0–100.0)
MCV: 86 fL (ref 78.0–100.0)
PLATELETS: 205 10*3/uL (ref 150–400)
PLATELETS: 157 10*3/uL (ref 150–400)
RBC: 3.82 MIL/uL — ABNORMAL LOW (ref 4.22–5.81)
RBC: 3.71 MIL/uL — AB (ref 4.22–5.81)
RDW: 14.6 % (ref 11.5–15.5)
RDW: 14.5 % (ref 11.5–15.5)
WBC: 5.4 10*3/uL (ref 4.0–10.5)
WBC: 13.6 10*3/uL — AB (ref 4.0–10.5)

## 2016-01-03 LAB — URINALYSIS, ROUTINE W REFLEX MICROSCOPIC
Bilirubin Urine: NEGATIVE
GLUCOSE, UA: NEGATIVE mg/dL
KETONES UR: NEGATIVE mg/dL
Leukocytes, UA: NEGATIVE
Nitrite: NEGATIVE
PROTEIN: NEGATIVE mg/dL
Specific Gravity, Urine: 1.017 (ref 1.005–1.030)
pH: 5.5 (ref 5.0–8.0)

## 2016-01-03 LAB — PREPARE RBC (CROSSMATCH)

## 2016-01-03 LAB — APTT
APTT: 34 s (ref 24–37)
APTT: 42 s — AB (ref 24–37)

## 2016-01-03 LAB — PROTIME-INR
INR: 1.18 (ref 0.00–1.49)
INR: 1.33 (ref 0.00–1.49)
PROTHROMBIN TIME: 16.6 s — AB (ref 11.6–15.2)
Prothrombin Time: 15.2 seconds (ref 11.6–15.2)

## 2016-01-03 LAB — TRIGLYCERIDES: Triglycerides: 68 mg/dL (ref <150)

## 2016-01-03 LAB — TROPONIN I: Troponin I: <0.03 ng/mL (ref <0.031)

## 2016-01-03 SURGERY — INSERTION, ENDOVASCULAR STENT GRAFT, AORTA, ABDOMINAL
Anesthesia: General | Site: Abdomen

## 2016-01-03 MED ORDER — PROTAMINE SULFATE 10 MG/ML IV SOLN
INTRAVENOUS | Status: AC
  Filled 2016-01-03: qty 10

## 2016-01-03 MED ORDER — LACTATED RINGERS IV SOLN
INTRAVENOUS | Status: DC | PRN
  Administered 2016-01-03 (×3): via INTRAVENOUS

## 2016-01-03 MED ORDER — ALBUTEROL SULFATE (2.5 MG/3ML) 0.083% IN NEBU: 3.0000 mL | INHALATION_SOLUTION | RESPIRATORY_TRACT | Status: DC | PRN

## 2016-01-03 MED ORDER — PROPOFOL 10 MG/ML IV BOLUS
INTRAVENOUS | Status: AC
  Filled 2016-01-03: qty 20

## 2016-01-03 MED ORDER — LACTATED RINGERS IV SOLN
INTRAVENOUS | Status: DC | PRN
  Administered 2016-01-03: 07:00:00 via INTRAVENOUS

## 2016-01-03 MED ORDER — PANTOPRAZOLE SODIUM 40 MG PO TBEC
40.0000 mg | DELAYED_RELEASE_TABLET | Freq: Every day | ORAL | Status: DC
  Administered 2016-01-06 – 2016-01-11 (×6): 40 mg via ORAL
  Filled 2016-01-03 (×6): qty 1

## 2016-01-03 MED ORDER — ROCURONIUM BROMIDE 50 MG/5ML IV SOLN
INTRAVENOUS | Status: AC
  Filled 2016-01-03: qty 1

## 2016-01-03 MED ORDER — ROCURONIUM BROMIDE 50 MG/5ML IV SOLN
INTRAVENOUS | Status: AC
  Filled 2016-01-03: qty 2

## 2016-01-03 MED ORDER — MIRTAZAPINE 30 MG PO TBDP
30.0000 mg | ORAL_TABLET | Freq: Every day | ORAL | Status: DC
  Filled 2016-01-03: qty 1

## 2016-01-03 MED ORDER — SODIUM CHLORIDE 0.9 % IV SOLN: 10.0000 mL/h | Freq: Once | INTRAVENOUS | Status: DC

## 2016-01-03 MED ORDER — GUAIFENESIN-DM 100-10 MG/5ML PO SYRP: 15.0000 mL | ORAL_SOLUTION | ORAL | Status: DC | PRN

## 2016-01-03 MED ORDER — ONDANSETRON HCL 4 MG/2ML IJ SOLN: 4.0000 mg | Freq: Four times a day (QID) | INTRAMUSCULAR | Status: DC | PRN

## 2016-01-03 MED ORDER — LIDOCAINE HCL (CARDIAC) 20 MG/ML IV SOLN
INTRAVENOUS | Status: DC | PRN
  Administered 2016-01-03: 60 mg via INTRAVENOUS

## 2016-01-03 MED ORDER — MAGNESIUM SULFATE 2 GM/50ML IV SOLN
2.0000 g | Freq: Every day | INTRAVENOUS | Status: AC | PRN
  Administered 2016-01-03: 2 g via INTRAVENOUS
  Filled 2016-01-03: qty 50

## 2016-01-03 MED ORDER — FENTANYL CITRATE (PF) 250 MCG/5ML IJ SOLN
INTRAMUSCULAR | Status: AC
  Filled 2016-01-03: qty 5

## 2016-01-03 MED ORDER — POTASSIUM CHLORIDE CRYS ER 20 MEQ PO TBCR: 20.0000 meq | EXTENDED_RELEASE_TABLET | Freq: Every day | ORAL | Status: DC | PRN

## 2016-01-03 MED ORDER — LABETALOL HCL 5 MG/ML IV SOLN
10.0000 mg | INTRAVENOUS | Status: DC | PRN
  Administered 2016-01-03: 10 mg via INTRAVENOUS
  Filled 2016-01-03: qty 4

## 2016-01-03 MED ORDER — MIDAZOLAM HCL 2 MG/2ML IJ SOLN: 1.0000 mg | INTRAMUSCULAR | Status: DC | PRN

## 2016-01-03 MED ORDER — SODIUM CHLORIDE 0.9 % IV SOLN
INTRAVENOUS | Status: DC | PRN
  Administered 2016-01-03: 10:00:00

## 2016-01-03 MED ORDER — CHLORHEXIDINE GLUCONATE 4 % EX LIQD: 60.0000 mL | Freq: Once | CUTANEOUS | Status: DC

## 2016-01-03 MED ORDER — ACETAMINOPHEN 325 MG RE SUPP
325.0000 mg | RECTAL | Status: DC | PRN
  Filled 2016-01-03: qty 2

## 2016-01-03 MED ORDER — MIDAZOLAM HCL 5 MG/5ML IJ SOLN
INTRAMUSCULAR | Status: DC | PRN
  Administered 2016-01-03: 2 mg via INTRAVENOUS

## 2016-01-03 MED ORDER — PROPOFOL 1000 MG/100ML IV EMUL: 5.0000 ug/kg/min | INTRAVENOUS | Status: DC

## 2016-01-03 MED ORDER — IPRATROPIUM-ALBUTEROL 0.5-2.5 (3) MG/3ML IN SOLN
3.0000 mL | RESPIRATORY_TRACT | Status: DC
  Administered 2016-01-04 (×5): 3 mL via RESPIRATORY_TRACT
  Filled 2016-01-03 (×5): qty 3

## 2016-01-03 MED ORDER — HYDRALAZINE HCL 20 MG/ML IJ SOLN: 5.0000 mg | INTRAMUSCULAR | Status: DC | PRN

## 2016-01-03 MED ORDER — PHENYLEPHRINE 40 MCG/ML (10ML) SYRINGE FOR IV PUSH (FOR BLOOD PRESSURE SUPPORT)
PREFILLED_SYRINGE | INTRAVENOUS | Status: AC
  Filled 2016-01-03: qty 10

## 2016-01-03 MED ORDER — ALBUMIN HUMAN 5 % IV SOLN
INTRAVENOUS | Status: DC | PRN
  Administered 2016-01-03 (×2): via INTRAVENOUS

## 2016-01-03 MED ORDER — IPRATROPIUM-ALBUTEROL 0.5-2.5 (3) MG/3ML IN SOLN
3.0000 mL | Freq: Four times a day (QID) | RESPIRATORY_TRACT | Status: DC
  Administered 2016-01-03: 3 mL via RESPIRATORY_TRACT
  Filled 2016-01-03: qty 3

## 2016-01-03 MED ORDER — BISACODYL 10 MG RE SUPP: 10.0000 mg | Freq: Every day | RECTAL | Status: DC | PRN

## 2016-01-03 MED ORDER — MANNITOL 25 % IV SOLN
INTRAVENOUS | Status: DC | PRN
  Administered 2016-01-03: 25 g via INTRAVENOUS

## 2016-01-03 MED ORDER — ALUM & MAG HYDROXIDE-SIMETH 200-200-20 MG/5ML PO SUSP: 15.0000 mL | ORAL | Status: DC | PRN

## 2016-01-03 MED ORDER — DEXTROSE 5 % IV SOLN
INTRAVENOUS | Status: AC
  Filled 2016-01-03: qty 1.5

## 2016-01-03 MED ORDER — HYDROMORPHONE HCL 1 MG/ML IJ SOLN: 0.2500 mg | INTRAMUSCULAR | Status: DC | PRN

## 2016-01-03 MED ORDER — KCL IN DEXTROSE-NACL 20-5-0.45 MEQ/L-%-% IV SOLN
INTRAVENOUS | Status: DC
  Administered 2016-01-03: 125 mL/h via INTRAVENOUS
  Administered 2016-01-04: 16:00:00 via INTRAVENOUS
  Administered 2016-01-05: 125 mL/h via INTRAVENOUS
  Administered 2016-01-05 – 2016-01-07 (×5): via INTRAVENOUS
  Filled 2016-01-03 (×14): qty 1000

## 2016-01-03 MED ORDER — HEPARIN SODIUM (PORCINE) 1000 UNIT/ML IJ SOLN
INTRAMUSCULAR | Status: DC | PRN
  Administered 2016-01-03 (×4): 2000 [IU] via INTRAVENOUS
  Administered 2016-01-03: 6000 [IU] via INTRAVENOUS
  Administered 2016-01-03 (×2): 2000 [IU] via INTRAVENOUS

## 2016-01-03 MED ORDER — EPHEDRINE SULFATE 50 MG/ML IJ SOLN
INTRAMUSCULAR | Status: DC | PRN
  Administered 2016-01-03 (×3): 5 mg via INTRAVENOUS

## 2016-01-03 MED ORDER — DEXTROSE 5 % IV SOLN
1.5000 g | Freq: Two times a day (BID) | INTRAVENOUS | Status: AC
  Administered 2016-01-03 – 2016-01-04 (×2): 1.5 g via INTRAVENOUS
  Filled 2016-01-03 (×2): qty 1.5

## 2016-01-03 MED ORDER — ESCITALOPRAM OXALATE 10 MG PO TABS: 20.0000 mg | ORAL_TABLET | Freq: Every day | ORAL | Status: DC

## 2016-01-03 MED ORDER — BUDESONIDE 0.5 MG/2ML IN SUSP
0.5000 mg | Freq: Two times a day (BID) | RESPIRATORY_TRACT | Status: DC
  Administered 2016-01-03 – 2016-01-11 (×15): 0.5 mg via RESPIRATORY_TRACT
  Filled 2016-01-03 (×16): qty 2

## 2016-01-03 MED ORDER — ONDANSETRON HCL 4 MG/2ML IJ SOLN
4.0000 mg | Freq: Four times a day (QID) | INTRAMUSCULAR | Status: DC | PRN
Start: 2016-01-03 — End: 2016-01-04

## 2016-01-03 MED ORDER — SODIUM CHLORIDE 0.9 % IV SOLN
INTRAVENOUS | Status: DC | PRN
  Administered 2016-01-03: 08:00:00

## 2016-01-03 MED ORDER — SODIUM CHLORIDE 0.9 % IV SOLN: INTRAVENOUS | Status: DC

## 2016-01-03 MED ORDER — IODIXANOL 320 MG/ML IV SOLN
INTRAVENOUS | Status: DC | PRN
  Administered 2016-01-03: 100 mL via INTRAVENOUS

## 2016-01-03 MED ORDER — FENTANYL BOLUS VIA INFUSION
25.0000 ug | INTRAVENOUS | Status: DC | PRN
  Filled 2016-01-03: qty 25

## 2016-01-03 MED ORDER — PROPOFOL 500 MG/50ML IV EMUL
INTRAVENOUS | Status: DC | PRN
  Administered 2016-01-03: 50 ug/kg/min via INTRAVENOUS

## 2016-01-03 MED ORDER — ESMOLOL HCL 100 MG/10ML IV SOLN
INTRAVENOUS | Status: DC | PRN
  Administered 2016-01-03: 20 mg via INTRAVENOUS

## 2016-01-03 MED ORDER — FENTANYL CITRATE (PF) 100 MCG/2ML IJ SOLN
INTRAMUSCULAR | Status: DC | PRN
  Administered 2016-01-03: 25 ug via INTRAVENOUS
  Administered 2016-01-03: 75 ug via INTRAVENOUS
  Administered 2016-01-03 (×3): 50 ug via INTRAVENOUS
  Administered 2016-01-03: 100 ug via INTRAVENOUS
  Administered 2016-01-03 (×3): 50 ug via INTRAVENOUS

## 2016-01-03 MED ORDER — PHENOL 1.4 % MT LIQD: 1.0000 | OROMUCOSAL | Status: DC | PRN

## 2016-01-03 MED ORDER — DOCUSATE SODIUM 100 MG PO CAPS
100.0000 mg | ORAL_CAPSULE | Freq: Every day | ORAL | Status: DC
  Administered 2016-01-06 – 2016-01-11 (×6): 100 mg via ORAL
  Filled 2016-01-03 (×6): qty 1

## 2016-01-03 MED ORDER — HEMOSTATIC AGENTS (NO CHARGE) OPTIME
TOPICAL | Status: DC | PRN
  Administered 2016-01-03: 2 via TOPICAL

## 2016-01-03 MED ORDER — CHLORHEXIDINE GLUCONATE CLOTH 2 % EX PADS: 6.0000 | MEDICATED_PAD | Freq: Once | CUTANEOUS | Status: DC

## 2016-01-03 MED ORDER — ACETAMINOPHEN 325 MG PO TABS
325.0000 mg | ORAL_TABLET | ORAL | Status: DC | PRN
  Filled 2016-01-03: qty 2

## 2016-01-03 MED ORDER — ONDANSETRON HCL 4 MG/2ML IJ SOLN
INTRAMUSCULAR | Status: AC
  Filled 2016-01-03: qty 2

## 2016-01-03 MED ORDER — ROCURONIUM BROMIDE 100 MG/10ML IV SOLN
INTRAVENOUS | Status: DC | PRN
  Administered 2016-01-03: 20 mg via INTRAVENOUS
  Administered 2016-01-03: 10 mg via INTRAVENOUS
  Administered 2016-01-03: 20 mg via INTRAVENOUS
  Administered 2016-01-03: 10 mg via INTRAVENOUS
  Administered 2016-01-03: 20 mg via INTRAVENOUS
  Administered 2016-01-03: 10 mg via INTRAVENOUS
  Administered 2016-01-03: 20 mg via INTRAVENOUS
  Administered 2016-01-03: 50 mg via INTRAVENOUS
  Administered 2016-01-03 (×2): 10 mg via INTRAVENOUS
  Administered 2016-01-03: 20 mg via INTRAVENOUS

## 2016-01-03 MED ORDER — SODIUM CHLORIDE 0.9 % IV SOLN
25.0000 ug/h | INTRAVENOUS | Status: DC
  Administered 2016-01-03: 200 ug/h via INTRAVENOUS
  Filled 2016-01-03: qty 50

## 2016-01-03 MED ORDER — TIOTROPIUM BROMIDE MONOHYDRATE 18 MCG IN CAPS
18.0000 ug | ORAL_CAPSULE | Freq: Every day | RESPIRATORY_TRACT | Status: DC
  Filled 2016-01-03: qty 5

## 2016-01-03 MED ORDER — ALBUTEROL SULFATE (2.5 MG/3ML) 0.083% IN NEBU: 3.0000 mL | INHALATION_SOLUTION | Freq: Four times a day (QID) | RESPIRATORY_TRACT | Status: DC | PRN

## 2016-01-03 MED ORDER — METOPROLOL TARTRATE 5 MG/5ML IV SOLN
2.0000 mg | INTRAVENOUS | Status: DC | PRN
  Filled 2016-01-03: qty 5

## 2016-01-03 MED ORDER — GLYCOPYRROLATE 0.2 MG/ML IJ SOLN
INTRAMUSCULAR | Status: DC | PRN
  Administered 2016-01-03 (×2): 0.2 mg via INTRAVENOUS

## 2016-01-03 MED ORDER — OXYCODONE HCL 5 MG PO TABS: 5.0000 mg | ORAL_TABLET | Freq: Once | ORAL | Status: DC | PRN

## 2016-01-03 MED ORDER — IODIXANOL 320 MG/ML IV SOLN
INTRAVENOUS | Status: DC | PRN
  Administered 2016-01-03: 50 mL via INTRAVENOUS

## 2016-01-03 MED ORDER — DEXTROSE 5 % IV SOLN
1.5000 g | INTRAVENOUS | Status: DC
  Filled 2016-01-03: qty 1.5

## 2016-01-03 MED ORDER — 0.9 % SODIUM CHLORIDE (POUR BTL) OPTIME
TOPICAL | Status: DC | PRN
  Administered 2016-01-03: 1000 mL

## 2016-01-03 MED ORDER — FENTANYL CITRATE (PF) 100 MCG/2ML IJ SOLN: 50.0000 ug | Freq: Once | INTRAMUSCULAR | Status: DC

## 2016-01-03 MED ORDER — DEXTROSE 5 % IV SOLN
10.0000 mg | INTRAVENOUS | Status: DC | PRN
  Administered 2016-01-03: 25 ug/min via INTRAVENOUS

## 2016-01-03 MED ORDER — CHLORHEXIDINE GLUCONATE 0.12 % MT SOLN
15.0000 mL | Freq: Two times a day (BID) | OROMUCOSAL | Status: DC
  Administered 2016-01-03 – 2016-01-04 (×2): 15 mL via OROMUCOSAL
  Filled 2016-01-03: qty 15

## 2016-01-03 MED ORDER — CETYLPYRIDINIUM CHLORIDE 0.05 % MT LIQD: 7.0000 mL | Freq: Two times a day (BID) | OROMUCOSAL | Status: DC

## 2016-01-03 MED ORDER — PROTAMINE SULFATE 10 MG/ML IV SOLN
INTRAVENOUS | Status: DC | PRN
  Administered 2016-01-03: 30 mg via INTRAVENOUS
  Administered 2016-01-03: 50 mg via INTRAVENOUS

## 2016-01-03 MED ORDER — PROPOFOL 10 MG/ML IV BOLUS
INTRAVENOUS | Status: DC | PRN
  Administered 2016-01-03: 100 mg via INTRAVENOUS

## 2016-01-03 MED ORDER — FLUTICASONE FUROATE-VILANTEROL 100-25 MCG/INH IN AEPB
1.0000 | INHALATION_SPRAY | Freq: Every day | RESPIRATORY_TRACT | Status: DC
  Filled 2016-01-03: qty 28

## 2016-01-03 MED ORDER — SODIUM CHLORIDE 0.9 % IV SOLN: 500.0000 mL | Freq: Once | INTRAVENOUS | Status: DC | PRN

## 2016-01-03 MED ORDER — MORPHINE SULFATE (PF) 2 MG/ML IV SOLN: 2.0000 mg | INTRAVENOUS | Status: DC | PRN

## 2016-01-03 MED ORDER — OXYCODONE HCL 5 MG/5ML PO SOLN: 5.0000 mg | Freq: Once | ORAL | Status: DC | PRN

## 2016-01-03 MED ORDER — MIDAZOLAM HCL 2 MG/2ML IJ SOLN
INTRAMUSCULAR | Status: AC
  Filled 2016-01-03: qty 2

## 2016-01-03 SURGICAL SUPPLY — 112 items
AGENT HMST SPONGE THK3/8 (HEMOSTASIS) ×4
BALLN MUSTANG 8.0X40 75 (BALLOONS) ×3
BALLOON MUSTANG 8.0X40 75 (BALLOONS) IMPLANT
CANISTER SUCTION 2500CC (MISCELLANEOUS) ×3 IMPLANT
CATH ANGIO 5F BER 65CM (CATHETERS) ×1 IMPLANT
CATH ANGIO 5F BER2 65CM (CATHETERS) ×1 IMPLANT
CATH BEACON 5.038 65CM KMP-01 (CATHETERS) IMPLANT
CATH EMB 3FR 80CM (CATHETERS) ×1 IMPLANT
CATH EMB 4FR 80CM (CATHETERS) ×1 IMPLANT
CATH OMNI FLUSH .035X70CM (CATHETERS) ×1 IMPLANT
CATH QUICKCROSS .035X135CM (MICROCATHETER) ×1 IMPLANT
CATH SOFT-VU ST 4F 90CM (CATHETERS) ×2 IMPLANT
CLIP TI MEDIUM 24 (CLIP) ×1 IMPLANT
CLIP TI WIDE RED SMALL 24 (CLIP) ×1 IMPLANT
COVER PROBE W GEL 5X96 (DRAPES) ×3 IMPLANT
DEVICE CLOSURE PERCLS PRGLD 6F (VASCULAR PRODUCTS) IMPLANT
DEVICE ENSNARE  12MMX20MM (VASCULAR PRODUCTS) ×2
DEVICE ENSNARE 12MMX20MM (VASCULAR PRODUCTS) IMPLANT
DEVICE TORQUE H2O (MISCELLANEOUS) ×1 IMPLANT
DEVICE TORQUE KENDALL .025-038 (MISCELLANEOUS) ×1 IMPLANT
DRAPE WARM FLUID 44X44 (DRAPE) ×1 IMPLANT
DRSG COVADERM 4X14 (GAUZE/BANDAGES/DRESSINGS) ×1 IMPLANT
DRSG TEGADERM 2-3/8X2-3/4 SM (GAUZE/BANDAGES/DRESSINGS) ×6 IMPLANT
DRYSEAL FLEXSHEATH 12FR 45CM (SHEATH) ×2
DRYSEAL FLEXSHEATH 16FR 33CM (SHEATH) ×2
DRYSEAL FLEXSHEATH 18FR 33CM (SHEATH) ×2
ELECT BLADE 4.0 EZ CLEAN MEGAD (MISCELLANEOUS) ×3
ELECT REM PT RETURN 9FT ADLT (ELECTROSURGICAL) ×6
ELECTRODE BLDE 4.0 EZ CLN MEGD (MISCELLANEOUS) IMPLANT
ELECTRODE REM PT RTRN 9FT ADLT (ELECTROSURGICAL) ×4 IMPLANT
ENDOPRO ILIAC 23X12X10 FA (Endovascular Graft) ×3 IMPLANT
ENDOPROSTHESIS ILI 23X12X10 FA (Endovascular Graft) IMPLANT
EXCLUDER TNK LEG 31MX14X13 (Endovascular Graft) IMPLANT
EXCLUDER TRUNK LEG 31MX14X13 (Endovascular Graft) ×3 IMPLANT
GAUZE SPONGE 2X2 8PLY STRL LF (GAUZE/BANDAGES/DRESSINGS) ×2 IMPLANT
GAUZE SPONGE 4X4 16PLY XRAY LF (GAUZE/BANDAGES/DRESSINGS) ×3 IMPLANT
GLOVE BIO SURGEON STRL SZ 6.5 (GLOVE) ×1 IMPLANT
GLOVE BIO SURGEON STRL SZ7 (GLOVE) ×3 IMPLANT
GLOVE BIOGEL PI IND STRL 6.5 (GLOVE) IMPLANT
GLOVE BIOGEL PI IND STRL 7.5 (GLOVE) ×2 IMPLANT
GLOVE BIOGEL PI INDICATOR 6.5 (GLOVE) ×2
GLOVE BIOGEL PI INDICATOR 7.5 (GLOVE) ×1
GLOVE ECLIPSE 6.5 STRL STRAW (GLOVE) ×1 IMPLANT
GOWN STRL REUS W/ TWL LRG LVL3 (GOWN DISPOSABLE) ×6 IMPLANT
GOWN STRL REUS W/TWL LRG LVL3 (GOWN DISPOSABLE) ×15
GRAFT BALLN CATH 65CM (STENTS) IMPLANT
GRAFT EXCLUDER LEG 12X10 (Endovascular Graft) ×1 IMPLANT
GRAFT EXCLUDER LEG 16X12 (Endovascular Graft) IMPLANT
GRAFT HEMASHIELD 16X8MM (Vascular Products) ×1 IMPLANT
GUIDEWIRE AMPLATZ SS .035X260 (WIRE) ×1 IMPLANT
GUIDEWIRE ANGLED .035X150CM (WIRE) ×1 IMPLANT
GUIDEWIRE ANGLED .035X260CM (WIRE) ×1 IMPLANT
HEMOSTAT SPONGE AVITENE ULTRA (HEMOSTASIS) ×2 IMPLANT
INSERT FOGARTY SM (MISCELLANEOUS) ×3 IMPLANT
KIT BASIN OR (CUSTOM PROCEDURE TRAY) ×3 IMPLANT
KIT ENCORE 26 ADVANTAGE (KITS) ×1 IMPLANT
KIT ROOM TURNOVER OR (KITS) ×3 IMPLANT
LIQUID BAND (GAUZE/BANDAGES/DRESSINGS) ×4 IMPLANT
LOOP VESSEL MAXI BLUE (MISCELLANEOUS) ×4 IMPLANT
LOOP VESSEL MINI RED (MISCELLANEOUS) ×1 IMPLANT
NDL PERC 18GX7CM (NEEDLE) ×2 IMPLANT
NEEDLE PERC 18GX7CM (NEEDLE) ×3 IMPLANT
NS IRRIG 1000ML POUR BTL (IV SOLUTION) ×3 IMPLANT
PACK ENDOVASCULAR (PACKS) ×3 IMPLANT
PAD ARMBOARD 7.5X6 YLW CONV (MISCELLANEOUS) ×6 IMPLANT
PENCIL BUTTON HOLSTER BLD 10FT (ELECTRODE) ×1 IMPLANT
PERCLOSE PROGLIDE 6F (VASCULAR PRODUCTS) ×15
RETAINER VISCERA MED (MISCELLANEOUS) ×1 IMPLANT
SET MICROPUNCTURE 5F STIFF (MISCELLANEOUS) ×1 IMPLANT
SHEATH AVANTI 11CM 5FR (MISCELLANEOUS) ×1 IMPLANT
SHEATH AVANTI 11CM 8FR (MISCELLANEOUS) ×1 IMPLANT
SHEATH BRITE TIP 8FR 23CM (MISCELLANEOUS) ×1 IMPLANT
SHEATH DRYSEAL FLEX 12FR 45CM (SHEATH) IMPLANT
SHEATH DRYSEAL FLEX 16FR 33CM (SHEATH) IMPLANT
SHEATH DRYSEAL FLEX 18FR 33CM (SHEATH) IMPLANT
SPONGE GAUZE 2X2 STER 10/PKG (GAUZE/BANDAGES/DRESSINGS) ×1
SPONGE LAP 18X18 X RAY DECT (DISPOSABLE) ×3 IMPLANT
STAPLER VISISTAT 35W (STAPLE) ×1 IMPLANT
STENT GRAFT BALLN CATH 65CM (STENTS) ×1
STOPCOCK MORSE 400PSI 3WAY (MISCELLANEOUS) ×3 IMPLANT
SUT ETHIBOND 5 LR DA (SUTURE) ×2 IMPLANT
SUT ETHILON 3 0 PS 1 (SUTURE) IMPLANT
SUT MNCRL AB 4-0 PS2 18 (SUTURE) ×6 IMPLANT
SUT PDS AB 1 TP1 96 (SUTURE) ×2 IMPLANT
SUT PROLENE 3 0 SH 48 (SUTURE) ×2 IMPLANT
SUT PROLENE 5 0 C 1 24 (SUTURE) ×6 IMPLANT
SUT PROLENE 6 0 BV (SUTURE) ×1 IMPLANT
SUT SILK 2 0 (SUTURE) ×3
SUT SILK 2 0 FS (SUTURE) ×1 IMPLANT
SUT SILK 2 0 SH CR/8 (SUTURE) ×1 IMPLANT
SUT SILK 2-0 18XBRD TIE 12 (SUTURE) IMPLANT
SUT SILK 3 0 (SUTURE) ×3
SUT SILK 3 0 SH CR/8 (SUTURE) ×1 IMPLANT
SUT SILK 3-0 18XBRD TIE 12 (SUTURE) IMPLANT
SUT SILK 4 0 (SUTURE) ×3
SUT SILK 4-0 18XBRD TIE 12 (SUTURE) IMPLANT
SUT VIC AB 2-0 CT1 36 (SUTURE) ×1 IMPLANT
SUT VIC AB 2-0 CTX 36 (SUTURE) ×2 IMPLANT
SUT VIC AB 3-0 SH 27 (SUTURE) ×12
SUT VIC AB 3-0 SH 27X BRD (SUTURE) IMPLANT
SYR 30ML LL (SYRINGE) ×3 IMPLANT
SYR 3ML LL SCALE MARK (SYRINGE) ×1 IMPLANT
SYR BULB IRRIGATION 50ML (SYRINGE) ×1 IMPLANT
TAPE UMBILICAL COTTON 1/8X30 (MISCELLANEOUS) ×2 IMPLANT
TRAY FOLEY W/METER SILVER 16FR (SET/KITS/TRAYS/PACK) ×3 IMPLANT
TUBING HIGH PRESSURE 120CM (CONNECTOR) ×3 IMPLANT
WIRE AMPLATZ SS-J .035X180CM (WIRE) ×2 IMPLANT
WIRE BENTSON .035X145CM (WIRE) ×1 IMPLANT
WIRE LUNDERQUIST .035X180CM (WIRE) ×1 IMPLANT
WIRE ROSEN-J .035X260CM (WIRE) ×1 IMPLANT
WIRE STIFF LUNDERQUIST 260MM (WIRE) ×1 IMPLANT
YANKAUER SUCT BULB TIP NO VENT (SUCTIONS) ×1 IMPLANT

## 2016-01-03 NOTE — Progress Notes (Signed)
Patient self extubated at 2130. MD notified. He was placed on 4L [place] and tolerates it very well at this time. RT will continue to monitor.

## 2016-01-03 NOTE — Consult Note (Signed)
PULMONARY / CRITICAL CARE MEDICINE   Name: [name] MRN: [id number] DOB: 01-05-1946    ADMISSION DATE:  01/03/2016 CONSULTATION DATE:  01/03/16  REFERRING MD:  Bridgett Larsson  CHIEF COMPLAINT:  Post op VDRF  HISTORY OF PRESENT ILLNESS:  Pt is encephelopathic; therefore, this HPI is obtained from chart review. [name] is a 70 y.o. male with PMH as outlined below including significant CAD, known AAA, and reported COPD (seen by Dr. Annamaria Boots in 2016 but no PFT's yet).  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  He was seen by Dr. Bridgett Larsson of vascular surgery and on 01/03/16, was taken to the OR for surgical intervention.  He had multiple procedures performed (placement of iliac branch device / bifurcated endograft / right iliac limb extension / explantation of endograft, iliac branch device, and right iliac limb / aortobifemoral bypass), and following completion of the case, he returned to the ICU and remained intubated.  PCCM was consulted for vent management.  PAST MEDICAL HISTORY :  He  has a past medical history of Coronary artery disease; Tobacco dependence; Ventricular dysfunction; Dyslipidemia; Atrial fibrillation ([place]); Myocardial infarction [facility]); COPD (chronic obstructive pulmonary disease) ([place]); Peripheral vascular disease ([place]); Stroke [facility]); Cardiomyopathy, ischemic (10/27/2014); AAA (abdominal aortic aneurysm) ([place]); Hypertension; Anxiety; Depression; Hyperlipidemia; Shortness of breath dyspnea; and Headache.  PAST SURGICAL HISTORY: He  has past surgical history that includes US ECHOCARDIOGRAPHY (12-26-2008); Back surgery; stent; left heart catheterization with coronary angiogram (N/A, 10/27/2014); Wrist surgery (Right); Hand surgery (Left); and Hernia repair  (Right).  Allergies  Allergen Reactions  . Aspirin Nausea Only and Other (See Comments)    Stomach upset  . Ibuprofen Hives    No current facility-administered medications on file prior to encounter.   Current Outpatient Prescriptions on File Prior to Encounter  Medication Sig  . albuterol (PROAIR HFA) 108 (90 Base) MCG/ACT inhaler Inhale 1-2 puffs into the lungs every 6 (six) hours as needed for wheezing or shortness of breath.   [name] aspirin EC 81 MG tablet Take 81 mg by mouth daily.  [name] atorvastatin (LIPITOR) 80 MG tablet Take 1 tablet (80 mg total) by mouth daily at 6 PM.  . BREO ELLIPTA 100-25 MCG/INH AEPB Inhale 1 puff into the lungs daily.  [name] escitalopram (LEXAPRO) 20 MG tablet Take 20 mg by mouth daily.  [name] lisinopril (PRINIVIL,ZESTRIL) 10 MG tablet Take 10 mg by mouth daily.  . metoprolol tartrate (LOPRESSOR) 25 MG tablet Take 2 tablets (50 mg total) by mouth 2 (two) times daily.  . mirtazapine (REMERON SOL-TAB) 30 MG disintegrating tablet Take 30 mg by mouth at bedtime.   . mometasone (NASONEX) 50 MCG/ACT nasal spray Place 2 sprays into the nose daily as needed (for sinus).   . nitroGLYCERIN (NITROSTAT) 0.4 MG SL tablet Place 0.4 mg under the tongue every 5 (five) minutes as needed for chest pain.   [name] oxyCODONE (OXY IR/ROXICODONE) 5 MG immediate release tablet Take 5 mg by mouth every 4 (four) hours as needed for moderate pain.   [name] oxyCODONE-acetaminophen (PERCOCET) 10-325 MG tablet Take 1 tablet by mouth every 4 (four) hours as needed for pain.   [name] tiotropium (SPIRIVA) 18 MCG inhalation capsule Place 18 mcg into inhaler and inhale daily.  [name] tiZANidine (ZANAFLEX) 4 MG tablet Take 4-8 mg by mouth 2 (two)  times daily. PT TAKES 2 TABLETS IN THE MORNING, 1 TABLET AT BEDTIME    FAMILY HISTORY:  His indicated that his mother is deceased. He indicated that his father is deceased. He indicated that his son is alive. He reported the following about his other: Family Hx of AAA, Bone Marrow  Cancer,. No Hx of early  heart disease.   SOCIAL HISTORY: He  reports that he has been smoking Cigarettes.  He has a 25 pack-year smoking history. He has never used smokeless tobacco. He reports that he does not drink alcohol or use illicit drugs.  REVIEW OF SYSTEMS:   Unable to obtain as pt is encephalopathic.  SUBJECTIVE:  On vent, appears comfortable.  VITAL SIGNS: BP 138/76 mmHg  Pulse 70  Temp(Src) 98.2 F (36.8 C)  Resp 18  Ht '5\' 6"'$  (1.676 m)  Wt 120 lb (54.432 kg)  BMI 19.38 kg/m2  SpO2 100%  HEMODYNAMICS:    VENTILATOR SETTINGS: Vent Mode:  [-] PRVC FiO2 (%):  [100 %] 100 % Set Rate:  [14 bmp] 14 bmp Vt Set:  [510 mL] 510 mL PEEP:  [5 cmH20] 5 cmH20 Plateau Pressure:  [20 cmH20] 20 cmH20  INTAKE / OUTPUT:     PHYSICAL EXAMINATION: General: Adult male, in NAD. Neuro: Sedated, does not follow commands. HEENT: [place]/AT. PERRL, sclerae anicteric. Cardiovascular: RRR, no M/R/G.  Lungs: Respirations even and unlabored.  Faint end expiratory wheezes. Abdomen: Abdominal midline dressing C/D/I, abd soft, NT/ND.  Musculoskeletal: No gross deformities, no edema.  Skin: Intact, warm, no rashes.  LABS:  BMET  Recent Labs Lab 01/03/16 0606  NA 138  K 4.2  CL 106  CO2 24  BUN 16  CREATININE 0.70  GLUCOSE 111*    Electrolytes  Recent Labs Lab 01/03/16 0606  CALCIUM 8.9    CBC  Recent Labs Lab 01/03/16 0606  WBC 5.4  HGB 10.4*  HCT 33.0*  PLT 205    Coag's  Recent Labs Lab 01/03/16 0606  APTT 34  INR 1.18    Sepsis Markers No results for input(s): LATICACIDVEN, PROCALCITON, O2SATVEN in the last 168 hours.  ABG  Recent Labs Lab 01/03/16 0606  PHART 7.395  PCO2ART 42.5  PO2ART 75.6*    Liver Enzymes  Recent Labs Lab 01/03/16 0606  AST 17  ALT 8*  ALKPHOS 84  BILITOT 0.5  ALBUMIN 3.4*    Cardiac Enzymes No results for input(s): TROPONINI, PROBNP in the last 168 hours.  Glucose No results for input(s): GLUCAP in  the last 168 hours.  Imaging No results found.   STUDIES:  None.  CULTURES: None.  ANTIBIOTICS: None.  SIGNIFICANT EVENTS: 04/27 > to OR for placement of iliac branch device / bifurcated endograft / right iliac limb extension / explantation of endograft, iliac branch device, and right iliac limb / aortobifemoral bypass.  LINES/TUBES: ETT 04/27 > L radial A line 04/27 >  DISCUSSION: 70 y.o. M with hx of AAA and significant vascular disease, taken to OR 04/27 for placement of iliac branch device / bifurcated endograft / right iliac limb extension / explantation of endograft, iliac branch device, and right iliac limb / aortobifemoral bypass.  He remained ventilated post operatively and PCCM was consulted for vent management.  ASSESSMENT / PLAN:  PULMONARY A: Post operative VDRF. Hx COPD. Tobacco dependence. P:   Full vent support. Wean as able. VAP prevention measures. SBT in AM if able. DuoNebs / Budesonide / Albuterol PRN. CXR in AM.  CARDIOVASCULAR A:  AAA with severe PVD - s/p placement of iliac branch device / bifurcated endograft / right iliac limb extension / explantation of endograft, iliac branch device, and right iliac limb / aortobifemoral bypass on 01/03/16 (Dr. Bridgett Larsson). Hx multivessel CAD s/p MI, PAF, ICM, HTN, HLD. P:  Post op management per vascular surgery. Labetalol PRN, hydralazine PRN, metoprolol PRN.  RENAL A:   No acute issues. P:   Correct electrolytes as indicated. BMP in AM.  GASTROINTESTINAL A:   GI prophylaxis. Nutrition. P:   SUP: Pantoprazole. NPO.  HEMATOLOGIC A:   VTE Prophylaxis. P:  SCD's. CBC in AM.  INFECTIOUS A:   Surgical prophylaxis. P:   Cefuroxime.  ENDOCRINE A:   No acute issues.  P:   Monitor glucose on BMP.  NEUROLOGIC A:   Acute encephalopathy - due to sedation. Hx CVA, anxiety, depression, headache. P:   Sedation:  Fentanyl gtt / Midazolam PRN. RASS goal: 0 to -1. Daily WUA. Hold  outpatient escitalopram, mirtazapine, oxycodone, percocet, tizanidine.   Family updated: None.  Interdisciplinary Family Meeting v Palliative Care Meeting:  Due by: 01/09/16.  CC time: 35 minutes.   Montey Hora, [facility] Pager: [phone]  or [phone] 01/03/2016, 5:55 PM

## 2016-01-03 NOTE — Anesthesia Preprocedure Evaluation (Signed)
Anesthesia Evaluation  Patient identified by MRN, date of birth, ID band Patient awake    Reviewed: Allergy & Precautions, NPO status , Patient's Chart, lab work & pertinent test results  Airway Mallampati: II   Neck ROM: full    Dental   Pulmonary shortness of breath, COPD, Current Smoker,    breath sounds clear to auscultation       Cardiovascular hypertension, + CAD, + Past MI, + Cardiac Stents, + Peripheral Vascular Disease and +CHF  + dysrhythmias  Rhythm:regular Rate:Normal  EF 35%.  Considered moderate risk for this percutaneous surgery by the Cardiologist, who cleared for surgery.   Neuro/Psych  Headaches, Anxiety Depression CVA    GI/Hepatic   Endo/Other    Renal/GU      Musculoskeletal   Abdominal   Peds  Hematology   Anesthesia Other Findings   Reproductive/Obstetrics                             Anesthesia Physical Anesthesia Plan  ASA: IV  Anesthesia Plan: General   Post-op Pain Management:    Induction: Intravenous  Airway Management Planned: Oral ETT  Additional Equipment: Arterial line  Intra-op Plan:   Post-operative Plan: Extubation in OR  Informed Consent: I have reviewed the patients History and Physical, chart, labs and discussed the procedure including the risks, benefits and alternatives for the proposed anesthesia with the patient or authorized representative who has indicated his/her understanding and acceptance.     Plan Discussed with: CRNA, Anesthesiologist and Surgeon  Anesthesia Plan Comments:         Anesthesia Quick Evaluation

## 2016-01-03 NOTE — Op Note (Addendum)
OPERATIVE NOTE   PROCEDURE: 1. Placement iliac branch device (23 mm x 12 mm x 10 cm) 2. Placement of bifurcated endograft (C3 31 mm x 14 mm x 13 cm) 3. Angioplasty of right external iliac artery (8 mm x 40 mm) 4. Placement right iliac limb extension (12 mm x 10 cm) 5. Explantation of endograft, iliac branch device, and and right iliac limb 6. Aortobifemoral bypass (16 mm x 8 mm) 7. Placement of catheter in aorta x 2 8. Aortogram 9. Radiologic S&I  PRE-OPERATIVE DIAGNOSIS: large abdominal aortic aneurysm, large right common iliac artery, moderate sized left common iliac artery, highly angulated aortic neck  POST-OPERATIVE DIAGNOSIS: same as above   CO-SURGEONS: Adele Barthel, MD; Gae Gallop, MD  ASSISTANT(S): Silva Bandy, [facility]   ANESTHESIA: general  ESTIMATED BLOOD LOSS: 500 cc  FINDING(S): 1.  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  Right distal external iliac artery stenosis >50% 3.  Right angle in infrarenal aorta which prevents appropriate proximal fixation of the main body endograft 4.  Aortic flow compromise from main body endograft overlap with iliac branch device 5.  Palpable anterior tibial artery pulses bilaterally at end of case  SPECIMEN(S):  none  INDICATIONS:   [name] is a 70 y.o. male who presents with large sized abdominal aortic aneurysm with highly angulated aortic neck, large right common iliac artery, and moderate sized left common iliac artery.  Originally, I felt he was not going to be an endovascular candidate.  His risk stratification, however, resulted in a determination of of cardiac high risk.  We had a frank discussion that his mortality rate for an open aortic procedure was likely >10%.  Subsequently, we decided to try an endovascular approach, knowing that there were increased risks for technical failure.   He was aware he was at risk for open conversion if  the endovascular manuevers failed.   The patient is aware the risks of endovascular aortic surgery include but are not limited to: bleeding, need for transfusion, infection, death, stroke, paralysis, wound complications, spinal, pelvic and bowel ischemia, extended ventilation, anaphylactic reaction to contrast, contrast induced nephropathy, embolism, and need for additional procedure to address endoleaks.    The patient is aware the risks of aortic surgery include but are not limited to: bleeding, need for transfusion, infection, death, stroke, paralysis, wound complications, bowel injuries, impotence, bowel ischemia, extended ventilation and future ventral hernias.    Overall, I cited a mortality rate of >10% and morbidity rate of 30%.   DESCRIPTION: After obtaining full informed written consent, the patient was brought back to the operating room and placed supine upon the operating table.  The patient received IV antibiotics prior to induction.  After obtaining adequate anesthesia, the patient was prepped and draped in the standard fashion for: open and endovascular aortic procedure.    Placement of access  Under ultrasound guidance, the right common femoral artery artery was then cannulated with a micropuncture needle.  The microwire was advanced into the iliac arterial system.  The needle was exchanged for a microsheath, which was loaded into the common femoral artery over the wire.  The microwire was exchanged for a Bentson wire which was advanced into the aorta.  The microsheath was then exchanged for a long 8-Fr dilator.  I exchanged the dilator for a Proglide device, which was deployed at 30 degrees medial.  This suture broke, so I to exchange this  device for a new device.  I repeated this deployment at 30 degrees medial.  The Proglide sutures were clamped and then the Bentson was replaced.  Over the wire, the used Proglide device was exchanged for a new device.  I placed this new device at 30  degrees lateral.  The Proglide sutures were clamped and then the Bentson was replaced.  The used Proglide device was exchanged for a long 8-Fr sheath which was loaded into the common iliac artery.  In such fashion, the "Preclose" technique was completed on the Right side.  Dr. Scot Dock then repeated this process on the left side, using a 18-gauge needle instead.  He complete this "Preclose" technique on the left side and placed a normal length 8-Fr sheath.  The patient was given 6000 units of Heparin intravenously, which was a therapeutic bolus. An additional 2000 units of Heparin was administered every hour after initial bolus to maintain anticoagulation.  In total, 18000 units of Heparin was administrated to achieve and maintain a therapeutic level of anticoagulation.    Placement of thru-and-thru wire  At this point, a BER-2 catheter was placed over the right wire and used to steer into the suprarenal artery.  This wire was exchanged for an Amplatz wire.  This sheath was exchanged for a 16-Fr Dryseal sheath, which was placed just distal to the aortic bifurcation.  Similarly on the left side, Dr. Scot Dock exchanged the wire for an Amplatz wire.  On this side, the sheath was exchanged for 12-Fr Flex Dryseal sheath.   This was placed just distal to the aortic bifurcation.  A pigtail catheter was placed on the left side and placed in the distal aorta.  The catheter was de-airred and de-clotted and connected to the power injector.  A distal aortogram was completed to evaluate the position of the distal aorta.  I placed a buddy wire of a 260 cm Cordis equivalent to the Bentson wire through the right sheath.  Dr. Scot Dock placed a snare via the left sheath.  With some diffculty, we were able to snare the Cordis wire and this wire was pulled from the distal aorta through the 12-Fr sheath and out.  This wire was used as a thru-and-thru wire for the iliac branch device.  This wire was placed under tension  bilaterally.     Placement of iliac branch device  I pulled out the right Amplatz wire and then placed a Bentson wire as a buddy wire and loaded a BER-2 catheter over the wire.  Using this catheter, I kept the wire away from the thru-and-thru wire, preventing wire wrap.  I replaced this wire in the suprarenal aorta.  The wire was exchanged for an Amplatz wire.   I loaded the iliac branch device, 23 mm x 12 mm x 10 cm, over the Amplatz wire and Cordis wire via the right sheath.  I loaded this device into the distal aorta.  The body of the iliac device was released, keeping the ipsilateral limb of the device constrain.  The contralateral gate was open toward the internal iliac artery.   Attempted placement of right internal iliac artery limb  At this point, Dr. Scot Dock steered the right Flex Dryseal sheath over the iliac branch device into open contralateral gate with great difficulty.  Multiple manipulations were needed prior to getting the Flex sheath into the this contralateral gate.  A buddy Glidewire and BER-2 catheter were placed through this Left sheath.  Despite multiple attempts with multiple wires and  catheter and multiple injections to determine the right internal iliac artery position, neither Dr. Scot Dock nor I were successful in cannulating this right internal iliac artery.  From the open portion of this case, this turned out to be due to a circumferentially calcified stenosis in the right internal iliac artery which severely restricted access to this artery.  I made the decision to proceed with the case, with a plan to ligate the right internal iliac artery artery with a retroperitoneal exposure at a later date if the left internal iliac artery did not thrombose with coverage.  I pulled the right iliac branch device distally and then released the ipsilateral limb of the iliac branch device and removed the stent delivery system.  The thru-and-thru wire was removed.   Placement of main body  endograft  At this point, the plan was to complete the rest of the endograft place.  First placing the main body via the right sheath.  I exchanged the right sheath for a 18-Fr Dryseal sheath.  While advancing the sheath through the 12 mm iliac branch device, it became evident that the 18-Fr sheath was binding on the device, confirming that there was significant right external iliac artery stenosis.  Despite manuevers with the sheath, the iliac branch device got nudged proximally.  I removed the dilator and pulled back the sheath.  I ballooned the covered portion of this right external iliac artery with a 8 mm x 40 mm angioplasty balloon at 8 atm for 2 minutes.  I then tried get through this stenotic segment of the distal external iliac artery.  The dilator on the 18-Fr sheath would not track into the ipsilateral limb of the branch device.  I had concerns the dilator had bumped the iliac branch device into the right common iliac artery.  Hand injections demonstrated this.  Subsequently, the plan had to change, with a change to placement of the main body on the left side.  I loaded a 12-Fr sheath through the 18-Fr sheath and then placed a pigtail catheter in the suprarenal segment.  A power injector aortogram demonstrated barely adequate length to place the main body superior to the iliac branch device on the left wire.  To maintain access through the iliac branch device as the device was now in the aneurysm common iliac artery, I elected to place right iliac limb, 12 mm x 10 cm.  I was placed the device over the right wire, deploying it with 2-3 cm of overlap of the proximal external iliac artery.  The stent delivery device was removed.  Dr. Scot Dock exchanged the left sheath for a 18-Fr Dryseal sheath.  He then loaded the C3 31 mm x 14 mm x 13 cm in the immediate infrarenal position.  A power injector aortogram was completed to identify the level of the renal arteries.  I then deployed the main body superior to  the renal arteries.  The main body, however, slipped due to the severe angulation almost 5 mm distal to the immediate infrarenal position.  Most concerning, this graft was position at the nadir of the turn in the infrarenal aortic neck.  I reconstrained the main body several time and tried to get the main body to stay superior to the turn in the aorta multiple times, but each time the graft slipped distally.  Even worse the main mody was slipped distal in a fashion that would jail the contralateral gate.  I had concerns that aortic flow was being compromised by the  overlap of the main body and iliac branch device.  At this point, I made the decision to convert to an open aortic procedure.  I reconstrained the C3 one last time and pulled it 1 cm distally to facilitate clamping the aortic neck.  I deployed the main body 1 cm distal to the turn in the aorta and then fully deployed the ipsilateral limb into the iliac branch device to help maintain flow while completing the open conversion.    Repair of bilateral common femoral artery   At this point, I tried to replace the Bentson wire in right iliac limb in the external iliac artery, but the wire kept binding on the edge of the stent.  Rather to spend more time, I elected to defer further wire placement on this side.  The right sheath was removed while pressure was held.  No drop in blood pressure was noted. The two Proglide wires were sequentially tightened and locked.  Pressure was held to the sutures with a hemostat.  No further active bleeding occurred.  In a similar fashion, Dr. Edilia Bo repaired the left common femoral artery.  No bleeding was active on this side also.     Open exposure of bilateral common femoral artery  At this point, Anesthesia placed a central line and the operating room staff brought the open aortic equipment into position, adding the Omnitract retractor.  I turned my attention to the right groin.  I made a longitudinal incision over  the common femoral artery.  Using blunt dissection and electrocautery, I dissected out the common femoral artery from the distal external iliac artery down to the femoral bifurcation.  I placed vessels loops around the superficial femoral artery and profunda femoral artery.  I dissected out the distal external iliac artery under the inguinal ligament, starting the iliac tunnel for a right aortobifemoral limb.  In a similar fashion, Dr. Edilia Bo dissected out the left common femoral artery surgically and created an iliac tunnel for the left aortobifemoral limb.  See his notes for these details.     Open celiotomy and aortic exposure  At this point, I marked the xiphoid bone and then made a midline incision from the xiphoid to the suprapubic level.  I dissected through the subcutaneous tissue with electrocautery until I on the fascia.  I opened the fascia in the midline and then bluntly opened the peritoneum.  I opened the entirety of the fascia and peritoneum under direct visualization.  I placed wet lap pads on the incisional walls and placed a Bookwalter retractor and extended it laterally to open the abdomen.  I quickly inspected the intraperitoneal contents and no obvious ischemia or tumors were evident.  I placed the transverse colon in a wet towel and then eviscerated the patient's small intestine into a wet towel.  There were some duodenal attachments which required dissection with electrocautery and blunt dissection.  In such fashion, I mobilized the duodenum.  Placed the Omnitract retractor system at this point, I placed retractor blades to gain exposure of the retroperitoneum.  I opened the retroperitoneum with electrocautery to the right side of the aorta.  In this process, I had to ligate the inferior mesenteric vein to get additional exposure.  I was able to visualized the inferior vena cava and follow this to the left renal vein.  This allowed me to find the aortic neck.  There was essentially a 90  degree turn in the aortic neck, which was the reason for the failure  of the aortic endograft to maintain position.  I easily identified a left renal artery and adrenal artery or accessory renal artery coming directly off the aorta.  I ligated this adrenal or accessory renal artery as it was going to be distal to the new proximal anastomosis.  I could not find a right renal artery easily, despite the CTA suggestion that the right renal artery was roughly at the same level as the left renal artery.  I had enough length so I did not need to clamp immediately at the infrarenal level.  I dissected around the aortic neck and placed a umbilical tape.  At this point, dissected the left side of this aorta, in the process coming across the Inferior mesenteric artery.  The orifice appeared to be calcified.  There was excellent retrograde flow, so I did not think I needed to reimplant this artery.  I suture ligated the two ends of this artery.  I then dissected down to the right common iliac artery.  Initially, I though this was the distal aorta due to the extremely large size 4-5 cm, after further dissection it became evident that this was the right common iliac artery aneurysm.  I dissected out the iliac bifurcation without difficulty and placed vessel loops around the external iliac artery and internal iliac artery for control.  There were obvious palpable stents in this segment of the artery.  I then dissected out the left common iliac artery down to the iliac bifurcation.  I decided to placed to two 0 Ethibond ties around the distal common iliac artery which was somewhat aneurysmal.    At this point, I dissected bluntly an iliac tunnel immediately superficial to the right iliac artery from the abdomen to right groin incision.  I placed a sponge clamp through this tunnel and passed an umbilical tape through this tunnel and clamped the ends together.  Dr. Scot Dock repeated this process on the left side.     Placement  of aortobifemoral graft  Note, I had given 25 g of Mannitol ~ 30 minutes prior to this portion of the case.  I started by ligating the left distal common iliac artery, leaving ~1 cm of the distal common iliac artery, by tying the two 0 Ethibond ties.  This was done to maintain left internal iliac artery perfusion via a retrograde fashion from the left external iliac artery tunnel.  I then clamped the right external iliac artery and right internal iliac artery.  I clamped the aorta immediately distal to the left renal artery.  I open the aorta in the midline ~2 cm distal to the clamp.  I sharply open the rest of the aorta down to the level of aortic bifurcation.  I bluntly removed the main body endograft.  I then removed the iliac branch device after removed some aortic thrombus.  Several lumbar arteries required 3-0 silk sutures to control the bleeding.  The right iliac limb was evident in the right common iliac artery aneurysm.  I removed this bluntly, completing the explant of this entire endograft repair.  I opened the right common iliac artery longitudinally.  I fashioned a cuff of the distal common iliac artery.  The orifice of the right internal iliac artery was evident, revealing a circumferentially calcified origins with minimal opening, thus explaining the difficulty cannulating this artery.  I completed an endarterctomy of this orifice, removing a large calcfied plaque.  In this process, this plaque lifted out plaque in the proximal external iliac artery  also.  I oversewed the distal common iliac artery with a 5-0 Prolene, creating a dead end from retrograde flow to perfuse the right internal iliac artery.    At this point, I sharply made a T in the proximal infrarenal aorta to facilitate an proximal anastomosis in an endoaneurysmorraphy fashion.  I selected 16 mm x 8 mm aortobifemoral graft for this patient's anatomy.  I shortened the main body of the graft.  I sewed the main body to the aortic neck  taking large bites of the intrarenal aortic neck with 3-0 Prolene.  I tightened the suture line with a nerve hook and then completed the anastomosis in the usual fashion.  I clamped the aortobifemoral limbs with a Fogarty clamp and then released the proximal clamp and then repaired a few bleeding areas with 3-0 Prolene.  At this point, there was minimal bleeding from the proximal anastomosis.  I clamped the aortic neck and sucked out blood in the graft and washed it out heparinized saline.  I separately clamped each limb with Fogarty clamps and then removed the the proximal aortic clamp.     Distal aortobifemoral limb anastomoses  At this point, I tied the right aortobifemoral limb to one end of the umbilical tape placed in the iliac tunnel.  I pulled the graft limb through the tunnel.   Dr. Edilia Bo did the same thing on his side.  See his Op note for details.  Each limb was allowed to reinflate by releasing the clamp.  This confirmed no kinks in each limb.  The clamps were reapplied.  At this point, I placed the right profunda femoral artery and superficial femoral artery under tension.  I made an arteriotomy and extended it proximally and distally with a Potts scissor.  Note this arteriotomy was more proximal than usual due to a high bifurcation in this patient.  I spatulated the distal of end right aortobifemoral limb, shortening the length in the process after pulling the limb to tension.  I sewed this limb to the right common femoral artery with a running stitch of 5-0 Prolene.  In a similar fashion, Dr. Edilia Bo completed the left femoral anastomosis with difficulty.  A 4 Fogarty was passed down the superficial femoral artery on both sides.  On the right, the Fogarty passed > 50 cm.  On the left, this only went 20 cm.  No thrombus was noted from either superficial femoral artery.  Both profunda femoral artery had not thrombus on passage of the Fogarty.  There was continuous but not particularly vigorous  blood flow from both profunda femoral arteries.  All arteries and aortobifemoral limbs were backbled prior to completion of the anastomosis on both sides.  No clot was noted.    At this point, there were multiphasic signals in both superficial femoral and profunda femoral arteries.  Distally there was a dopplerable bilateral left anterior tibial arteries, similar to preoperative exam.     Hemostasis and closure  At this point, the patient was given a total of 80 mg of Protamine before he began clotting.  Avitene was packed into both groins at the proximal anastomosis and distal aortic sac.  After a several rounds of Avitene and waiting ~10 minutes, active bleeding ended.    While waiting, both femoral artery exposures were repaired with a double layer of 2-0 Vicryl immediately superficial to the common femoral artery.  This was followed by a double layer of 3-0 Vicryl in the superficial subcutaneous tissue, followed by a  running subcuticular of 4-0 Monocryl.  The skin was cleaned, dried, and reinforced with Dermabond.  I turned my attention back to the abdomen.  No further active bleeding was present though there was some slow raw surface ooze.  I planned to control this by closure of the retroperitoneum.  I reapproximated the residual aortic sac around the aortobifemoral graft with a running stitch of 2-0 Vicryl.  I then reapproximated the retroperitoneum of a running stitch of 3-0 Vicryl.  I ran the bowel and found no injuries.  The nasogastric tube was readjusted in this process.  I replaced all bowels into anatomic position, redraping the omentum.  I closed the fascia with two running stitch of double stranded 1 PDS, running sutures from superior and inferior ends of the abdomen and tying the two sutures together in the middle.  The skin was reapproximated with staples.  The midline incision was cleaned, dried, and covered with a Coverall.   Completion distal exam  At the end of this case, there  were palpable anterior tibial artery pulses in each leg, right > left.   COMPLICATIONS: conversion to open aortic repair  CONDITION: stable   Adele Barthel, MD [facility] Office: [phone] Pager: [phone]  01/03/2016, 5:28 PM

## 2016-01-03 NOTE — Anesthesia Procedure Notes (Signed)
Procedure Name: Intubation Date/Time: 01/03/2016 7:49 AM Performed by: Shirlyn Goltz Pre-anesthesia Checklist: Emergency Drugs available, Patient identified, Suction available and Patient being monitored Patient Re-evaluated:Patient Re-evaluated prior to inductionOxygen Delivery Method: Circle system utilized Preoxygenation: Pre-oxygenation with 100% oxygen Intubation Type: IV induction Ventilation: Mask ventilation without difficulty and Oral airway inserted - appropriate to patient size Laryngoscope Size: Mac and 3 Grade View: Grade I Tube type: Oral Tube size: 7.5 mm Number of attempts: 1 Airway Equipment and Method: Stylet Placement Confirmation: ETT inserted through vocal cords under direct vision,  positive ETCO2 and breath sounds checked- equal and bilateral Secured at: 21 cm Tube secured with: Tape Dental Injury: Teeth and Oropharynx as per pre-operative assessment

## 2016-01-03 NOTE — Progress Notes (Signed)
Pt self extubated @ 2130 and was placed on 4L [place].  Pt alert and oriented, said he woke up and freaked out.  M.D. Notified.  I will continue to monitor.

## 2016-01-03 NOTE — H&P (Signed)
Established Abdominal Aortic Aneurysm  History of Present Illness   [name] is a 70 y.o. ([birth year]) male who presents with chief complaint: follow up cardiology evaluation. 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 His external iliac arteries look too diseased to tolerate a aortobi-iliac graft. The patient does not have back or abdominal pain. The patient is still smoking. He denies any changes in urinary habit or BP control.  His cardiology risk stratification was high risk for open procedures and moderate risk for percutaneous procedures.   Past Medical History  Diagnosis Date  . Coronary artery disease     Multivessel, S/p stenting to LCx  . Tobacco dependence   . Ventricular dysfunction     Mild Left  . Dyslipidemia   . Atrial fibrillation (HCC)     Brief episode of  . Myocardial infarction ([place])   . COPD (chronic obstructive pulmonary disease) ([place])   . Peripheral vascular disease ([place])   . Stroke ([place])   . Cardiomyopathy, ischemic 10/27/2014  . AAA (abdominal aortic aneurysm) [facility])     Past Surgical History  Procedure Laterality Date  . US echocardiography  12-26-2008    EF 40-45%  . Back surgery      cervical  . Stent      pt unsure of location  . Left heart catheterization with coronary angiogram N/A 10/27/2014    Procedure: LEFT HEART CATHETERIZATION WITH CORONARY ANGIOGRAM; Surgeon: Blane Ohara, MD; Location: [facility] CATH LAB; Service: Cardiovascular; Laterality: N/A;    Social History   Social History  . Marital Status: Widowed    Spouse Name: N/A  . Number of Children: 1  . Years of  Education: N/A   Occupational History  . Not on file.   Social History Main Topics  . Smoking status: Light Tobacco Smoker -- 0.50 packs/day for 50 years    Types: Cigarettes  . Smokeless tobacco: Never Used  . Alcohol Use: No  . Drug Use: No  . Sexual Activity: Not on file   Other Topics Concern  . Not on file   Social History Narrative    Family History  Problem Relation Age of Onset  . AAA (abdominal aortic aneurysm) Mother   . Cancer Father     bone marrow    Current Outpatient Prescriptions  Medication Sig Dispense Refill  . aspirin EC 81 MG tablet Take 81 mg by mouth daily.    [name] atorvastatin (LIPITOR) 80 MG tablet Take 1 tablet (80 mg total) by mouth daily at 6 PM. 30 tablet 5  . escitalopram (LEXAPRO) 20 MG tablet Take 30 mg by mouth daily.     [name] HYDROcodone-acetaminophen (NORCO/VICODIN) 5-325 MG per tablet Take 1 tablet by mouth every 6 (six) hours as needed for moderate pain.    [name] levofloxacin (LEVAQUIN) 500 MG tablet     . lisinopril (PRINIVIL,ZESTRIL) 10 MG tablet     . metoprolol tartrate (LOPRESSOR) 25 MG tablet Take 2 tablets (50 mg total) by mouth 2 (two) times daily. 30 tablet 10  . mirtazapine (REMERON SOL-TAB) 30 MG disintegrating tablet     . nitroGLYCERIN (NITROSTAT) 0.4 MG SL tablet Place 0.4 mg under the tongue every 5 (five) minutes as needed for chest pain.     [name] oxyCODONE (  OXY IR/ROXICODONE) 5 MG immediate release tablet     . oxyCODONE-acetaminophen (PERCOCET) 10-325 MG tablet     . tiotropium (SPIRIVA) 18 MCG inhalation capsule Place 1 capsule (18 mcg total) into inhaler and inhale daily. 30 capsule 3  . tiZANidine (ZANAFLEX) 4 MG tablet     . Umeclidinium Bromide (INCRUSE ELLIPTA) 62.5 MCG/INH AEPB Inhale 62.5 mcg into the lungs daily. 7 each 0  . albuterol (PROAIR HFA) 108 (90 BASE) MCG/ACT inhaler Inhale 1-2 puffs  into the lungs every 6 (six) hours as needed for wheezing or shortness of breath.     . mometasone (NASONEX) 50 MCG/ACT nasal spray Place 2 sprays into the nose daily. 17 g 2  . nicotine (NICODERM CQ - DOSED IN MG/24 HOURS) 21 mg/24hr patch Place 1 patch (21 mg total) onto the skin daily. (Patient not taking: Reported on 11/02/2015) 28 patch 0   No current facility-administered medications for this visit.     Allergies  Allergen Reactions  . Aspirin Nausea Only and Other (See Comments)    Stomach upset  . Ibuprofen Hives     REVIEW OF SYSTEMS: (Positives checked otherwise negative)  CARDIOVASCULAR:  '[ ]'$  chest pain,  '[x]'$  chest pressure,  '[x]'$  palpitations,  '[ ]'$  shortness of breath when laying flat,  '[ ]'$  shortness of breath with exertion,  '[ ]'$  pain in feet when walking,  '[ ]'$  pain in feet when laying flat, '[ ]'$  history of blood clot in veins (DVT),  '[ ]'$  history of phlebitis,  '[ ]'$  swelling in legs,  '[ ]'$  varicose veins  PULMONARY:  '[ ]'$  productive cough,  '[ ]'$  asthma,  '[ ]'$  wheezing  NEUROLOGIC:  '[ ]'$  weakness in arms or legs,  '[ ]'$  numbness in arms or legs,  '[ ]'$  difficulty speaking or slurred speech,  '[ ]'$  temporary loss of vision in one eye,  '[ ]'$  dizziness  HEMATOLOGIC:  '[ ]'$  bleeding problems,  '[ ]'$  problems with blood clotting too easily  MUSCULOSKEL:  '[ ]'$  joint pain, '[ ]'$  joint swelling  GASTROINTEST:  '[ ]'$  vomiting blood,  '[ ]'$  blood in stool   GENITOURINARY:  '[ ]'$  burning with urination,  '[ ]'$  blood in urine  PSYCHIATRIC:  '[ ]'$  history of major depression  INTEGUMENTARY:  '[ ]'$  rashes,  '[ ]'$  ulcers  CONSTITUTIONAL:  '[ ]'$  fever,  '[ ]'$  chills   Physical Examination Filed Vitals:   11/02/15 1357  BP: 127/78  Pulse: 65  Height: '5\' 6"'$  (1.676 m)  Weight: 128 lb (58.06 kg)  SpO2: 96%  Body mass index is 20.67 kg/(m^2).  General: A&O x 3, WD, thin  Pulmonary: Sym exp,  good air movt, CTAB, no rales, rhonchi, & wheezing  Cardiac: RRR, Nl S1, S2, no Murmurs, rubs or gallops  Vascular: Vessel Right Left  Radial Palpable Palpable  Brachial Palpable Palpable  Carotid Palpable, without bruit Palpable, without bruit  Aorta Not palpable N/A  Femoral Palpable Palpable  Popliteal Not palpable Not palpable  PT Palpable Palpable  DP Palpable Palpable   Gastrointestinal: soft, NTND, no G/R, no HSM, no masses, no CVAT B  Musculoskeletal: M/S 5/5 throughout , Extremities without ischemic changes   Neurologic: Pain and light touch intact in extremities , Motor exam as listed above  Medical Decision Making  [name] is a 70 y.o. (May 09, 1946) male who presents with: asymptomatic enlarging AAA with large right common iliac artery and small left common iliac artery  We discussed the findings from his cardiologist.  We also discussed that in the event of aortic rupture, survival if he makes it to the hospital is historically only 50% with the majority 80-90% dying prior to arrival.  In this setting, if he is going to request attempt at repair, regardless of the cardiac status, his survival rate would be better elective that emergent.  Subsequently, I will schedule him for an EVAR with possible right iliac branch device, placement of Palmaz stent, possible open repair (ABF, ligation of bilateral common iliac artery)  The patient is aware the risks of aortic surgery include but are not limited to: bleeding, need for transfusion, infection, death, stroke, paralysis, wound complications, bowel injuries, impotence, bowel ischemia, extended ventilation and future ventral hernias.   Overall, I cited a mortality rate likely to exceed 10% given his cardiac status in event of open repair and morbidity rate of 30%.  We will make arrangement for the procedure on 11/22/15. Additional equipment including the  iliac branch device to salvage the right internal iliac artery and Palmaz stent to straighten the angulation in the infrarenal aorta need to be special ordered.  Thank you for allowing [place] to participate in this patient's care.  Adele Barthel, MD, FACS [facility] Office: [phone] Pager: [phone]  11/02/2015, 6:52 PM  Addendum  This patient's EVAR was canceled twice.  Once due to technical failure of the hybrid OR.  The second time the prior procedures pushed the start time for his procedure after 3 PM when the manning was inadequate.  There has been no changes in his health since the originally scheduled time period.  Adele Barthel, MD [facility] [place] Office: [phone] Pager: [phone]  01/03/2016, 7:18 AM

## 2016-01-03 NOTE — Progress Notes (Signed)
[place] Progress Note Patient Name: [name] DOB: 01-25-1946 MRN: [id number]   Date of Service  01/03/2016  HPI/Events of Note  Pt self extubated. Appears comfortable, adequate saturations. No evidence for compromised airway protection. Will follow him closely  eICU Interventions       Intervention Category Intermediate Interventions: Other:  Harpreet Pompey S. 01/03/2016, 9:57 PM

## 2016-01-03 NOTE — Transfer of Care (Signed)
Immediate Anesthesia Transfer of Care Note  Patient: [name]  Procedure(s) Performed: Procedure(s): ABDOMINAL AORTIC ENDOVASCULAR STENT GRAFT IMPLANTED/EXPLANTED (N/A) ANEURYSM ABDOMINAL AORTIC REPAIR USING 14MM X 8MM X40CM HEMASHIELD GOLD GRAFT  Patient Location: ICU  Anesthesia Type:General  Level of Consciousness: sedated and Patient remains intubated per anesthesia plan  Airway & Oxygen Therapy: Patient remains intubated per anesthesia plan and Patient placed on Ventilator (see vital sign flow sheet for setting)  Post-op Assessment: Report given to RN  Post vital signs: Reviewed and stable  Last Vitals:  Filed Vitals:   01/03/16 0619  BP: 157/89  Pulse: 69  Temp: 36.8 C  Resp: 18    Last Pain: There were no vitals filed for this visit.       Complications: No apparent anesthesia complications

## 2016-01-04 ENCOUNTER — Inpatient Hospital Stay (HOSPITAL_COMMUNITY): Payer: Medicare Other

## 2016-01-04 ENCOUNTER — Encounter (HOSPITAL_COMMUNITY): Payer: Self-pay | Admitting: Vascular Surgery

## 2016-01-04 DIAGNOSIS — J95821 Acute postprocedural respiratory failure: Secondary | ICD-10-CM

## 2016-01-04 DIAGNOSIS — J449 Chronic obstructive pulmonary disease, unspecified: Secondary | ICD-10-CM

## 2016-01-04 LAB — COMPREHENSIVE METABOLIC PANEL
ALK PHOS: 56 U/L (ref 38–126)
ALT: 13 U/L — AB (ref 17–63)
AST: 24 U/L (ref 15–41)
Albumin: 2.8 g/dL — ABNORMAL LOW (ref 3.5–5.0)
Anion gap: 9 (ref 5–15)
BUN: 10 mg/dL (ref 6–20)
CALCIUM: 8 mg/dL — AB (ref 8.9–10.3)
CHLORIDE: 101 mmol/L (ref 101–111)
CO2: 24 mmol/L (ref 22–32)
CREATININE: 0.82 mg/dL (ref 0.61–1.24)
GFR calc Af Amer: >60 mL/min (ref >60)
Glucose, Bld: 216 mg/dL — ABNORMAL HIGH (ref 65–99)
Potassium: 4.2 mmol/L (ref 3.5–5.1)
Sodium: 134 mmol/L — ABNORMAL LOW (ref 135–145)
Total Bilirubin: 0.7 mg/dL (ref 0.3–1.2)
Total Protein: 5.5 g/dL — ABNORMAL LOW (ref 6.5–8.1)

## 2016-01-04 LAB — POCT I-STAT 3, ART BLOOD GAS (G3+)
ACID-BASE DEFICIT: 1 mmol/L (ref 0.0–2.0)
Bicarbonate: 24.7 mEq/L — ABNORMAL HIGH (ref 20.0–24.0)
O2 Saturation: 95 %
PH ART: 7.364 (ref 7.350–7.450)
TCO2: 26 mmol/L (ref 0–100)
pCO2 arterial: 43.6 mmHg (ref 35.0–45.0)
pO2, Arterial: 78 mmHg — ABNORMAL LOW (ref 80.0–100.0)

## 2016-01-04 LAB — AMYLASE: Amylase: 40 U/L (ref 28–100)

## 2016-01-04 LAB — POCT I-STAT 7, (LYTES, BLD GAS, ICA,H+H)
ACID-BASE DEFICIT: 4 mmol/L — AB (ref 0.0–2.0)
Acid-Base Excess: 1 mmol/L (ref 0.0–2.0)
Acid-Base Excess: 1 mmol/L (ref 0.0–2.0)
BICARBONATE: 28.3 meq/L — AB (ref 20.0–24.0)
Bicarbonate: 26.6 mEq/L — ABNORMAL HIGH (ref 20.0–24.0)
Bicarbonate: 23.5 mEq/L (ref 20.0–24.0)
CALCIUM ION: 1.2 mmol/L (ref 1.13–1.30)
CALCIUM ION: 1.11 mmol/L — AB (ref 1.13–1.30)
Calcium, Ion: 1.1 mmol/L — ABNORMAL LOW (ref 1.13–1.30)
HEMATOCRIT: 27 % — AB (ref 39.0–52.0)
HEMATOCRIT: 24 % — AB (ref 39.0–52.0)
HEMATOCRIT: 29 % — AB (ref 39.0–52.0)
HEMOGLOBIN: 8.2 g/dL — AB (ref 13.0–17.0)
HEMOGLOBIN: 9.9 g/dL — AB (ref 13.0–17.0)
Hemoglobin: 9.2 g/dL — ABNORMAL LOW (ref 13.0–17.0)
O2 SAT: 100 %
O2 SAT: 100 %
O2 Saturation: 99 %
PCO2 ART: 55.9 mmHg — AB (ref 35.0–45.0)
PCO2 ART: 44.5 mmHg (ref 35.0–45.0)
PCO2 ART: 51.9 mmHg — AB (ref 35.0–45.0)
PH ART: 7.31 — AB (ref 7.350–7.450)
PH ART: 7.256 — AB (ref 7.350–7.450)
PO2 ART: 222 mmHg — AB (ref 80.0–100.0)
POTASSIUM: 3.9 mmol/L (ref 3.5–5.1)
Patient temperature: 36.4
Patient temperature: 35.6
Potassium: 4 mmol/L (ref 3.5–5.1)
Potassium: 4.8 mmol/L (ref 3.5–5.1)
SODIUM: 133 mmol/L — AB (ref 135–145)
Sodium: 137 mmol/L (ref 135–145)
Sodium: 134 mmol/L — ABNORMAL LOW (ref 135–145)
TCO2: 30 mmol/L (ref 0–100)
TCO2: 28 mmol/L (ref 0–100)
TCO2: 25 mmol/L (ref 0–100)
pH, Arterial: 7.382 (ref 7.350–7.450)
pO2, Arterial: 165 mmHg — ABNORMAL HIGH (ref 80.0–100.0)
pO2, Arterial: 185 mmHg — ABNORMAL HIGH (ref 80.0–100.0)

## 2016-01-04 LAB — GLUCOSE, CAPILLARY
GLUCOSE-CAPILLARY: 162 mg/dL — AB (ref 65–99)
GLUCOSE-CAPILLARY: 140 mg/dL — AB (ref 65–99)
GLUCOSE-CAPILLARY: 126 mg/dL — AB (ref 65–99)
Glucose-Capillary: 135 mg/dL — ABNORMAL HIGH (ref 65–99)

## 2016-01-04 LAB — CBC
HEMATOCRIT: 31.1 % — AB (ref 39.0–52.0)
HEMOGLOBIN: 10.1 g/dL — AB (ref 13.0–17.0)
MCH: 27.7 pg (ref 26.0–34.0)
MCHC: 32.5 g/dL (ref 30.0–36.0)
MCV: 85.4 fL (ref 78.0–100.0)
PLATELETS: 163 10*3/uL (ref 150–400)
RBC: 3.64 MIL/uL — AB (ref 4.22–5.81)
RDW: 14.7 % (ref 11.5–15.5)
WBC: 10.8 10*3/uL — AB (ref 4.0–10.5)

## 2016-01-04 LAB — LACTIC ACID, PLASMA: Lactic Acid, Venous: 2.7 mmol/L (ref 0.5–2.0)

## 2016-01-04 LAB — PHOSPHORUS: Phosphorus: 2.4 mg/dL — ABNORMAL LOW (ref 2.5–4.6)

## 2016-01-04 LAB — MAGNESIUM: MAGNESIUM: 2.1 mg/dL (ref 1.7–2.4)

## 2016-01-04 LAB — TROPONIN I

## 2016-01-04 MED ORDER — IPRATROPIUM-ALBUTEROL 0.5-2.5 (3) MG/3ML IN SOLN
3.0000 mL | Freq: Four times a day (QID) | RESPIRATORY_TRACT | Status: DC
  Administered 2016-01-05 (×4): 3 mL via RESPIRATORY_TRACT
  Filled 2016-01-04 (×3): qty 3

## 2016-01-04 MED ORDER — MORPHINE SULFATE (PF) 2 MG/ML IV SOLN
2.0000 mg | INTRAVENOUS | Status: DC | PRN
  Administered 2016-01-04: 4 mg via INTRAVENOUS
  Administered 2016-01-04: 2 mg via INTRAVENOUS
  Administered 2016-01-04: 4 mg via INTRAVENOUS
  Filled 2016-01-04: qty 2
  Filled 2016-01-04: qty 1
  Filled 2016-01-04: qty 2

## 2016-01-04 MED ORDER — DIPHENHYDRAMINE HCL 12.5 MG/5ML PO ELIX: 12.5000 mg | ORAL_SOLUTION | Freq: Four times a day (QID) | ORAL | Status: DC | PRN

## 2016-01-04 MED ORDER — NALOXONE HCL 0.4 MG/ML IJ SOLN: 0.4000 mg | INTRAMUSCULAR | Status: DC | PRN

## 2016-01-04 MED ORDER — CETYLPYRIDINIUM CHLORIDE 0.05 % MT LIQD
7.0000 mL | Freq: Two times a day (BID) | OROMUCOSAL | Status: DC
  Administered 2016-01-04 – 2016-01-10 (×10): 7 mL via OROMUCOSAL

## 2016-01-04 MED ORDER — SODIUM CHLORIDE 0.9% FLUSH: 9.0000 mL | INTRAVENOUS | Status: DC | PRN

## 2016-01-04 MED ORDER — DIPHENHYDRAMINE HCL 50 MG/ML IJ SOLN: 12.5000 mg | Freq: Four times a day (QID) | INTRAMUSCULAR | Status: DC | PRN

## 2016-01-04 MED ORDER — MORPHINE SULFATE 2 MG/ML IV SOLN
INTRAVENOUS | Status: DC
  Administered 2016-01-04: 2 mg via INTRAVENOUS
  Administered 2016-01-04: 9 mg via INTRAVENOUS
  Administered 2016-01-05: 3 mg via INTRAVENOUS
  Administered 2016-01-05 (×2): 4.5 mg via INTRAVENOUS
  Administered 2016-01-05: 12:00:00 via INTRAVENOUS
  Administered 2016-01-05: 9 mg via INTRAVENOUS
  Administered 2016-01-05: 15 mg via INTRAVENOUS
  Administered 2016-01-05: 12 mg via INTRAVENOUS
  Administered 2016-01-06: 15 mg via INTRAVENOUS
  Administered 2016-01-06: 10.5 mg via INTRAVENOUS
  Administered 2016-01-06: 6 mg via INTRAVENOUS
  Administered 2016-01-06 (×2): 10.5 mg via INTRAVENOUS
  Administered 2016-01-07: 9 mg via INTRAVENOUS
  Administered 2016-01-07: 16.5 mg via INTRAVENOUS
  Administered 2016-01-07 (×2): 1.5 mg via INTRAVENOUS
  Administered 2016-01-07: 3 mg via INTRAVENOUS
  Administered 2016-01-07: 7.5 mg via INTRAVENOUS
  Administered 2016-01-08: 20 mg via INTRAVENOUS
  Administered 2016-01-08: 7.5 mg via INTRAVENOUS
  Administered 2016-01-08: 9 mg via INTRAVENOUS
  Filled 2016-01-04 (×5): qty 25

## 2016-01-04 MED ORDER — INSULIN ASPART 100 UNIT/ML ~~LOC~~ SOLN
0.0000 [IU] | Freq: Three times a day (TID) | SUBCUTANEOUS | Status: DC
  Administered 2016-01-04: 2 [IU] via SUBCUTANEOUS
  Administered 2016-01-04 – 2016-01-06 (×4): 1 [IU] via SUBCUTANEOUS
  Administered 2016-01-06 – 2016-01-08 (×2): 2 [IU] via SUBCUTANEOUS
  Administered 2016-01-10: 1 [IU] via SUBCUTANEOUS

## 2016-01-04 MED ORDER — METOPROLOL TARTRATE 5 MG/5ML IV SOLN
2.5000 mg | Freq: Four times a day (QID) | INTRAVENOUS | Status: DC
  Administered 2016-01-04 – 2016-01-06 (×9): 2.5 mg via INTRAVENOUS
  Filled 2016-01-04 (×8): qty 5

## 2016-01-04 MED ORDER — ONDANSETRON HCL 4 MG/2ML IJ SOLN: 4.0000 mg | Freq: Four times a day (QID) | INTRAMUSCULAR | Status: DC | PRN

## 2016-01-04 MED FILL — Sodium Chloride IV Soln 0.9%: INTRAVENOUS | Qty: 1000 | Status: AC

## 2016-01-04 MED FILL — Sodium Chloride Irrigation Soln 0.9%: Qty: 3000 | Status: AC

## 2016-01-04 MED FILL — Heparin Sodium (Porcine) Inj 1000 Unit/ML: INTRAMUSCULAR | Qty: 30 | Status: AC

## 2016-01-04 NOTE — Op Note (Signed)
    NAME: [name]   MRN: [id number] DOB: [birth year]    DATE OF OPERATION: 01/04/2016  PREOP DIAGNOSIS: Abdominal aortic aneurysm and bilateral common iliac artery aneurysms  POSTOP DIAGNOSIS: Same  PROCEDURE:  1. Exposure of left common femoral artery 2. Left femoral anastomosis (left limb of aortofemoral bypass graft)  CO-SURGEON's: Judeth Cornfield. Scot Dock, MD, FACS and Adele Barthel, MD, FACS  ASSIST: Silva Bandy, [facility]  ANESTHESIA: Gen.   INDICATIONS: [name] is a 70 y.o. male who presents for elective repair of his abdominal aortic aneurysm and bilateral common iliac artery aneurysms.  TECHNIQUE: Given the complexity of the case, and the patients comorbidities, a two team approach was used in order to expedite the procedure.  The left femoral artery had been closed with the Perclose device. I made a longitudinal incision over the left femoral artery and dissection was carried down to the femoral artery to the site of cannulation. The dissection was extended proximally and distally. The common femoral artery was controlled with a vessel loop. The superficial femoral artery and deep femoral arteries were controlled as were 2 side branches. A retroperitoneal tunnel was started.  After the proximal anastomosis had been completed, the left limb of the aortofemoral bypass graft was brought down for anastomosis to the left common femoral artery. The patient had been heparinized. The common femoral artery was clamped proximally. The deep femoral artery and superficial femoral arteries were controlled with Vesseloops. A longitudinal arteriotomy was made in the common femoral artery just medial to the cannulation site. The left limb of the graft was cut the appropriate length, spatulated, and sewn end to side to the left common femoral artery using continuous 5-0 Prolene suture. Prior to completing the anastomosis, the artery was backbled and flushed appropriately and the anastomosis  completed. Flow was reestablished to the left leg. The patient tolerated this from a hemodynamic standpoint.  Hemostasis was obtained in the wounds. The deep layer was closed with 2-0 Vicryl. The subcutaneous layer was closed with 3-0 Vicryl. The skin was closed with a 4-0 silk stitch.  Deitra Mayo, MD, FACS [facility]  DATE OF DICTATION:   01/04/2016

## 2016-01-04 NOTE — Care Management Note (Addendum)
Case Management Note  Patient Details  Name: [name] MRN: [id number] Date of Birth: [birth year]  Subjective/Objective:      Pt admitted with abdominal aortic aneurysm              Action/Plan:  PTA- independent from home with adult son - son can provide supervision post discharge if recommended.  PT evaluated and recommended possible SNF discharge - CSW consulted regarding tentative discharge.  CM will continue to monitor for disposition needs   Expected Discharge Date:                  Expected Discharge Plan:  Home/Self Care  In-House Referral:     Discharge planning Services  CM Consult  Post Acute Care Choice:    Choice offered to:     DME Arranged:    DME Agency:     HH Arranged:    HH Agency:     Status of Service:  In process, will continue to follow  Medicare Important Message Given:    Date Medicare IM Given:    Medicare IM give by:    Date Additional Medicare IM Given:    Additional Medicare Important Message give by:     If discussed at [place] of Stay Meetings, dates discussed:    Additional Comments:  Maryclare Labrador, RN 01/04/2016, 11:01 AM

## 2016-01-04 NOTE — Progress Notes (Signed)
PULMONARY / CRITICAL CARE MEDICINE   Name: [name] MRN: [id number] DOB: 08-17-1946    ADMISSION DATE:  01/03/2016 CONSULTATION DATE:  01/03/16  REFERRING MD:  Bridgett Larsson  CHIEF COMPLAINT:  Post op VDRF  HISTORY OF PRESENT ILLNESS:   [name] is a 70 y.o. male with PMH as outlined below including significant CAD, known AAA, and reported COPD (seen by Dr. Annamaria Boots in 2016 but no PFT's yet).  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  He was seen by Dr. Bridgett Larsson of vascular surgery and on 01/03/16, was taken to the OR for surgical intervention.  He had multiple procedures performed (placement of iliac branch device / bifurcated endograft / right iliac limb extension / explantation of endograft, iliac branch device, and right iliac limb / aortobifemoral bypass), and following completion of the case, he returned to the ICU and remained intubated.  PCCM was consulted for vent management.   SUBJECTIVE:  Self extubated about 10p Denies pain, dyspnea oob to chair  VITAL SIGNS: BP 123/77 mmHg  Pulse 87  Temp(Src) 98 F (36.7 C) (Oral)  Resp 13  Ht '5\' 6"'$  (1.676 m)  Wt 120 lb (54.432 kg)  BMI 19.38 kg/m2  SpO2 98%  HEMODYNAMICS:    VENTILATOR SETTINGS: Vent Mode:  [-] PRVC FiO2 (%):  [32 %-100 %] 32 % Set Rate:  [14 bmp] 14 bmp Vt Set:  [510 mL] 510 mL PEEP:  [5 cmH20] 5 cmH20 Plateau Pressure:  [14 cmH20-20 cmH20] 14 cmH20  INTAKE / OUTPUT: I/O last 3 completed shifts: In: 5998.9 [I.V.:4338.9; Blood:970; NG/GT:90; IV Piggyback:600] Out: 3557 [DUKGU:5427; Emesis/NG output:200; Blood:1050]   PHYSICAL EXAMINATION: General: Adult male, in NAD. Neuro:grossly non focal HEENT: [place]/AT. PERRL, sclerae anicteric. Cardiovascular: RRR, no M/R/G.  Lungs:  Respirations even and unlabored.  No wheezes. Abdomen: Abdominal midline dressing C/D/I, abd soft, NT/ND.  Musculoskeletal: No gross deformities, no edema.  Skin: Intact, warm, no rashes.  LABS:  BMET  Recent Labs Lab 01/03/16 0606  01/03/16 1549 01/03/16 2025 01/04/16 0420  NA 138  < > 134* 134* 134*  K 4.2  < > 4.8 4.8 4.2  CL 106  --   --  103 101  CO2 24  --   --  23 24  BUN 16  --   --  11 10  CREATININE 0.70  --   --  0.76 0.82  GLUCOSE 111*  --   --  183* 216*  < > = values in this interval not displayed.  Electrolytes  Recent Labs Lab 01/03/16 0606 01/03/16 2025 01/04/16 0420  CALCIUM 8.9 8.0* 8.0*  MG  --  1.5* 2.1  PHOS  --   --  2.4*    CBC  Recent Labs Lab 01/03/16 0606  01/03/16 1549 01/03/16 2025 01/04/16 0420  WBC 5.4  --   --  13.6* 10.8*  HGB 10.4*  < > 9.9* 10.1* 10.1*  HCT 33.0*  < > 29.0* 31.9* 31.1*  PLT 205  --   --  157 163  < > = values in this interval not displayed.  Coag's  Recent Labs Lab 01/03/16 0606 01/03/16 2025  APTT 34 42*  INR 1.18 1.33    Sepsis Markers  Recent Labs Lab 01/04/16 0420  LATICACIDVEN 2.7*    ABG  Recent Labs Lab 01/03/16 1549 01/03/16 1821 01/04/16 0420  PHART 7.256* 7.367 7.364  PCO2ART 51.9* 41.1 43.6  PO2ART 222.0* 383* 78.0*    Liver Enzymes  Recent Labs Lab 01/03/16 0606 01/04/16 0420  AST 17 24  ALT 8* 13*  ALKPHOS 84 56  BILITOT 0.5 0.7  ALBUMIN 3.4* 2.8*    Cardiac Enzymes  Recent Labs Lab 01/03/16 2230 01/04/16 0420  TROPONINI <0.03 <0.03    Glucose  Recent Labs Lab 01/04/16 0808 01/04/16 1241  GLUCAP 162* 140*    Imaging Dg Chest Port 1 View  01/04/2016  CLINICAL DATA:  Status post repair of abdominal aortic aneurysm EXAM: PORTABLE CHEST 1 VIEW COMPARISON:  01/03/2016 FINDINGS: Cardiac shadow is mildly enlarged but stable. Nasogastric catheter is noted in the stomach. The endotracheal tube is been removed. A right jugular central line is again  seen and stable. The lungs are well aerated bilaterally. No infiltrate or pneumothorax is seen. No bony abnormality is noted. IMPRESSION: No acute abnormality identified. Electronically Signed   By: Inez Catalina M.D.   On: 01/04/2016 07:47   Dg Chest Port 1 View  01/03/2016  CLINICAL DATA:  Status post abdominal aortic aneurysm repair. Initial encounter. EXAM: PORTABLE CHEST 1 VIEW COMPARISON:  Chest radiograph from 09/18/2015 FINDINGS: The patient's endotracheal tube is seen ending 5-6 cm above the carina. An enteric tube is noted extending below the diaphragm. A right IJ line is noted ending about the proximal to mid SVC. Mild vascular congestion is noted. No pleural effusion or pneumothorax is seen. The cardiomediastinal silhouette is borderline enlarged. No acute osseous abnormalities are identified. IMPRESSION: Mild vascular congestion and borderline cardiomegaly. Tubes and lines as described above. Lungs remain grossly clear. Electronically Signed   By: Garald Balding M.D.   On: 01/03/2016 19:28   Dg Abd Portable 1v  01/03/2016  CLINICAL DATA:  Status post abdominal aortic aneurysm repair. Initial encounter. EXAM: PORTABLE ABDOMEN - 1 VIEW COMPARISON:  CT of the abdomen and pelvis performed 10/19/2015 FINDINGS: Midline skin staples are noted along the abdominal wall. The patient's enteric tube is noted ending overlying the body of the stomach. The visualized bowel gas pattern is unremarkable. Scattered air and stool filled loops of colon are seen; no abnormal dilatation of small bowel loops is seen to suggest small bowel obstruction. No free intra-abdominal air is identified, though evaluation for free air is limited on a single supine view. The visualized osseous structures are within normal limits; the sacroiliac joints are unremarkable in appearance. IMPRESSION: Unremarkable bowel gas pattern; no free intra-abdominal air seen. Moderate amount of stool noted in the colon. Electronically Signed   By:  Garald Balding M.D.   On: 01/03/2016 19:30     STUDIES:  None.  CULTURES: None.  ANTIBIOTICS: None.  SIGNIFICANT EVENTS: 04/27 > to OR for placement of iliac branch device / bifurcated endograft / right iliac limb extension / explantation of endograft, iliac branch device, and right iliac limb / aortobifemoral bypass.  LINES/TUBES: ETT 04/27 > 4/27 L radial A line 04/27 >  DISCUSSION: 70 y.o. M with hx of AAA and significant vascular disease, taken to OR 04/27 for placement of iliac branch device / bifurcated endograft / right iliac limb extension / explantation of endograft, iliac branch device, and right iliac limb / aortobifemoral bypass.  He remained ventilated post operatively and PCCM was consulted for vent management.  ASSESSMENT / PLAN:  PULMONARY A: Post operative VDRF -resolved Hx COPD. Tobacco dependence. P:  DuoNebs / Budesonide / Albuterol PRN. Resume breo & spiriva on discharge   CARDIOVASCULAR A:  AAA with severe PVD - s/p placement of iliac branch device / bifurcated endograft / right iliac limb extension / explantation of endograft, iliac branch device, and right iliac limb / aortobifemoral bypass on 01/03/16 (Dr. Bridgett Larsson). Hx multivessel CAD s/p MI, PAF, ICM, HTN, HLD. P:  Post op management per vascular surgery. Labetalol PRN, hydralazine PRN, metoprolol PRN.  RENAL A:   Mild hyponatremia P:   Correct electrolytes as indicated. BMP in AM.    NEUROLOGIC A:   Acute encephalopathy - due to sedation, resolved Hx CVA, anxiety, depression, headache. P:    Hold outpatient escitalopram, mirtazapine, oxycodone, percocet, tizanidine. Resume when able tot ake PO  PCCM available as needed   Kara Mead MD. FCCP. [facility] Pager [phone] If no response call [phone]   01/04/2016   01/04/2016, 1:19 PM

## 2016-01-04 NOTE — Progress Notes (Signed)
CRITICAL VALUE ALERT  Critical value received:  Lactic 2.7  Date of notification:  01/04/2016  Time of notification:  0530  Critical value read back:Yes.    Nurse who received alert:  Tommy Rainwater  MD notified (1st page):  E link  Time of first page:  0530  MD notified (2nd page):  Time of second page:  Responding MD:    Time MD responded:

## 2016-01-04 NOTE — Evaluation (Signed)
Physical Therapy Evaluation Patient Details Name: [name] MRN: [id number] DOB: 09/27/1945 Today's Date: 01/04/2016   History of Present Illness  Pt adm with AAA and bilateral common iliac artery aneurysms. Pt underwent extensive endovascular and then open repair of aneurysms and other varios bypasses on 01/03/16. Pt self extubated on 01/04/16. PMH - COPD, PVD, CAD, MI, Stroke  Clinical Impression  Pt admitted with above diagnosis and presents to PT with functional limitations due to deficits listed below (See PT problem list). Pt needs skilled PT to maximize independence and safety to allow discharge to ST-SNF prior to return home. Pt may progress quickly enough to return home at DC but he will have to be independent since son will be at work.     Follow Up Recommendations SNF    Equipment Recommendations  None recommended by PT    Recommendations for Other Services       Precautions / Restrictions Precautions Precautions: Fall Restrictions Weight Bearing Restrictions: No      Mobility  Bed Mobility Overal bed mobility: Needs Assistance Bed Mobility: Rolling;Sidelying to Sit Rolling: Mod assist;+2 for safety/equipment Sidelying to sit: Mod assist;+2 for safety/equipment;HOB elevated       General bed mobility comments: Verbal cues for technique. Assist to bend knees and to elevate trunk into sitting.  Transfers Overall transfer level: Needs assistance Equipment used: None Transfers: Sit to/from [facility] Sit to Stand: Mod assist;+2 safety/equipment Stand pivot transfers: Min assist;+2 safety/equipment       General transfer comment: Assist to power up and for balance. Small pivotal steps.  Ambulation/Gait                Stairs            Wheelchair Mobility    Modified Rankin (Stroke Patients Only)       Balance Overall balance assessment: Needs assistance Sitting-balance support: No upper extremity supported;Feet  supported Sitting balance-Leahy Scale: Fair     Standing balance support: Bilateral upper extremity supported Standing balance-Leahy Scale: Poor Standing balance comment: min A for static standing.                             Pertinent Vitals/Pain Pain Assessment: 0-10 Pain Score: 10-Worst pain ever Pain Location: abdomen Pain Descriptors / Indicators: Grimacing;Guarding Pain Intervention(s): Limited activity within patient's tolerance;Monitored during session;Premedicated before session;Repositioned    Home Living Family/patient expects to be discharged to:: Private residence Living Arrangements: Children Available Help at Discharge: Family;Available PRN/intermittently (son works) Type of Home: House Home Access: Level entry     Home Layout: One [place]: Environmental consultant - 2 wheels;Wheelchair - manual      Prior Function Level of Independence: Independent               Hand Dominance   Dominant Hand:  (both)    Extremity/Trunk Assessment   Upper Extremity Assessment: Defer to OT evaluation           Lower Extremity Assessment: Generalized weakness         Communication   Communication: No difficulties  Cognition Arousal/Alertness: Awake/alert Behavior During Therapy: WFL for tasks assessed/performed Overall Cognitive Status: Within Functional Limits for tasks assessed                      General Comments      Exercises        Assessment/Plan    PT  Assessment Patient needs continued PT services  PT Diagnosis Difficulty walking;Generalized weakness;Acute pain   PT Problem List Decreased strength;Decreased activity tolerance;Decreased balance;Decreased mobility;Decreased knowledge of use of DME;Decreased safety awareness;Pain  PT Treatment Interventions DME instruction;Gait training;Functional mobility training;Therapeutic activities;Therapeutic exercise;Balance training;Patient/family education   PT Goals (Current  goals can be found in the Care Plan section) Acute Rehab PT Goals Patient Stated Goal: Return home PT Goal Formulation: With patient Time For Goal Achievement: 01/18/16 Potential to Achieve Goals: Good    Frequency Min 3X/week   Barriers to discharge Decreased caregiver support Son works so pt alone at times    Co-evaluation               End of Session Equipment Utilized During Treatment: Oxygen (No gait belt due to incisional pain) Activity Tolerance: Patient limited by pain Patient left: in chair;with call bell/phone within reach Nurse Communication: Mobility status         Time: 3435-6861 PT Time Calculation (min) (ACUTE ONLY): 20 min   Charges:   PT Evaluation $PT Eval Moderate Complexity: 1 Procedure     PT G Codes:        Rochele Lueck 10-Jan-2016, 10:44 AM Suanne Marker PT [phone]

## 2016-01-04 NOTE — Progress Notes (Addendum)
ICU Progress Note    CC:  Pt self extubated last night-complains of a little bit of a sore throat.  Inquires about what Dr. Bridgett Larsson had to do.  States he went through this with his wife a little over a year ago.    CV:   HR  50's-110's  026'V-785'Y systolic  Gtts: None  Filed Vitals:   01/04/16 0600 01/04/16 0700  BP: 113/78 119/74  Pulse: 89 89  Temp:    Resp: 20 11    LUNGS:  CTAB with occasional expiratory wheeze at bases 96% 4LO2NC  PCXR:   ABG    Component Value Date/Time   PHART 7.364 01/04/2016 0420   PCO2ART 43.6 01/04/2016 0420   PO2ART 78.0* 01/04/2016 0420   HCO3 24.7* 01/04/2016 0420   TCO2 26 01/04/2016 0420   ACIDBASEDEF 1.0 01/04/2016 0420   O2SAT 95.0 01/04/2016 0420   chest X-ray 01/04/16 Bibasilar atelectasis No PTX seen NGT appears to be in the stomach  NEURO:   In tact  RENAL:  Intake/Output Summary (Last 24 hours) at 01/04/16 0717 Last data filed at 01/04/16 0700  Gross per 24 hour  Intake 5998.9 ml  Output   4375 ml  Net 1623.9 ml   UOP:  3125cc/24hr  BMET    Component Value Date/Time   NA 134* 01/04/2016 0420   K 4.2 01/04/2016 0420   CL 101 01/04/2016 0420   CO2 24 01/04/2016 0420   GLUCOSE 216* 01/04/2016 0420   BUN 10 01/04/2016 0420   CREATININE 0.82 01/04/2016 0420   CALCIUM 8.0* 01/04/2016 0420   GFRNONAA >60 01/04/2016 0420   GFRAA >60 01/04/2016 0420     HEME/ID:   Tm 99.3 . CBC    Component Value Date/Time   WBC 10.8* 01/04/2016 0420   RBC 3.64* 01/04/2016 0420   HGB 10.1* 01/04/2016 0420   HCT 31.1* 01/04/2016 0420   PLT 163 01/04/2016 0420   MCV 85.4 01/04/2016 0420   MCH 27.7 01/04/2016 0420   MCHC 32.5 01/04/2016 0420   RDW 14.7 01/04/2016 0420   LYMPHSABS 0.7 10/14/2014 1727   MONOABS 0.4 10/14/2014 1727   EOSABS 0.0 10/14/2014 1727   BASOSABS 0.1 10/14/2014 1727   ABDOMEN: Soft, NT/ND; -BS -flatus  NGT output:  200cc/24hr   EXTREMITIES: 2+ palpable bilateral DP  pulses  INCISIONS: Laparotomy incision is with bandage and is clean and dry Bilateral groins are clean and dry without hematoma   A/P: 70 y.o. male who is s/p: 1. Placement iliac branch device (23 mm x 12 mm x 10 cm) 2. Placement of bifurcated endograft (C3 31 mm x 14 mm x 13 cm) 3. Angioplasty of right external iliac artery (8 mm x 40 mm) 4. Placement right iliac limb extension (12 mm x 10 cm) 5. Explantation of endograft, iliac branch device, and and right iliac limb 6. Aortobifemoral bypass (16 mm x 8 mm) 7. Placement of catheter in aorta x 2 8. Aortogram 9. Radiologic S&I 1 Day Post-Op   -pt doing well post operatively-he self extubated last night and is doing well.  Will see how he does-may need swallow study in the future - would hold off for now as he is npo and see how he does. -he does have palpable pedal pulses bilaterally -NGT with 200cc out-will leave this in today and continue npo -creatinine is normal with good UOP -acute surgical blood loss anemia-received 2 units PRBC's & stable -BUN/Cr look good this morning with good UOP -troponin  lab x 2 normal -lactic acid slightly elevated-monitor -pt on multiple antihypertensives pre operatively-will start low dose lopressor IV q6h -glucose > 200 this am-will start SSI (ssensitive) -incentive spirometer 10x/hr -Morphine 2-'4mg'$  IV q2h prn -keep in ICU today   [name], PA-C [facility] [phone]   Addendum  I agree with the physician assistant's findings.  Will be by later after clinic.  His post-operative recovery is appropriate given the magnitude of the procedure.  His pulmonary function will likely be his biggest risk post-operatively.  Pulmonary toilet.  May need schedule neb to help with resolution of atelectiasis.  Adele Barthel, MD [facility] [place] Office: [phone] Pager: [phone]  01/04/2016, 7:56 AM  Addendum  Discussed with the patient  intraoperative events and need for conversion to open procedure.  The patient c/o poorly controlled pain at this point.  He denies any SOB.  On exam, stable hemodynamics with good UOP.  BLL ATX on exam with decreasing oxygen need.   BP control adequate with Metoprolol IV.    - continue in ICU today to monitor respiratory status for 24 hours - watch UOP given significant contrast load during case   Adele Barthel, MD [facility] [place] Office: [phone] Pager: [phone]  01/04/2016, 2:30 PM

## 2016-01-04 NOTE — Anesthesia Postprocedure Evaluation (Signed)
Anesthesia Post Note  Patient: [name]  Procedure(s) Performed: Procedure(s) (LRB): ABDOMINAL AORTIC ENDOVASCULAR STENT GRAFT IMPLANTED/EXPLANTED (N/A) ANEURYSM ABDOMINAL AORTIC REPAIR USING 14MM X 8MM X40CM HEMASHIELD GOLD GRAFT  Patient location during evaluation: ICU Anesthesia Type: General Level of consciousness: sedated and patient remains intubated per anesthesia plan Vital Signs Assessment: post-procedure vital signs reviewed and stable Respiratory status: patient remains intubated per anesthesia plan Cardiovascular status: stable Anesthetic complications: no    Last Vitals:  Filed Vitals:   01/04/16 0700 01/04/16 0757  BP: 119/74   Pulse: 89   Temp:  36.8 C  Resp: 11     Last Pain:  Filed Vitals:   01/04/16 0757  PainSc: 0-No pain                 Ilithyia Titzer S

## 2016-01-05 LAB — CBC
HEMATOCRIT: 28.1 % — AB (ref 39.0–52.0)
HEMOGLOBIN: 8.9 g/dL — AB (ref 13.0–17.0)
MCH: 27.3 pg (ref 26.0–34.0)
MCHC: 31.7 g/dL (ref 30.0–36.0)
MCV: 86.2 fL (ref 78.0–100.0)
Platelets: 150 10*3/uL (ref 150–400)
RBC: 3.26 MIL/uL — ABNORMAL LOW (ref 4.22–5.81)
RDW: 14.7 % (ref 11.5–15.5)
WBC: 11.8 10*3/uL — ABNORMAL HIGH (ref 4.0–10.5)

## 2016-01-05 LAB — GLUCOSE, CAPILLARY
GLUCOSE-CAPILLARY: 116 mg/dL — AB (ref 65–99)
GLUCOSE-CAPILLARY: 110 mg/dL — AB (ref 65–99)
GLUCOSE-CAPILLARY: 128 mg/dL — AB (ref 65–99)
Glucose-Capillary: 131 mg/dL — ABNORMAL HIGH (ref 65–99)

## 2016-01-05 LAB — BASIC METABOLIC PANEL
ANION GAP: 6 (ref 5–15)
BUN: 8 mg/dL (ref 6–20)
CALCIUM: 8 mg/dL — AB (ref 8.9–10.3)
CO2: 27 mmol/L (ref 22–32)
Chloride: 98 mmol/L — ABNORMAL LOW (ref 101–111)
Creatinine, Ser: 0.69 mg/dL (ref 0.61–1.24)
GFR calc Af Amer: >60 mL/min (ref >60)
GLUCOSE: 125 mg/dL — AB (ref 65–99)
Potassium: 4.3 mmol/L (ref 3.5–5.1)
SODIUM: 131 mmol/L — AB (ref 135–145)

## 2016-01-05 LAB — PREPARE RBC (CROSSMATCH)

## 2016-01-05 LAB — PHOSPHORUS: PHOSPHORUS: 1.6 mg/dL — AB (ref 2.5–4.6)

## 2016-01-05 MED ORDER — SODIUM CHLORIDE 0.9% FLUSH
10.0000 mL | Freq: Two times a day (BID) | INTRAVENOUS | Status: DC
  Administered 2016-01-05 – 2016-01-08 (×4): 10 mL
  Administered 2016-01-09 – 2016-01-10 (×2): 20 mL
  Administered 2016-01-10: 10 mL

## 2016-01-05 MED ORDER — SODIUM CHLORIDE 0.9 % IV SOLN: Freq: Once | INTRAVENOUS | Status: DC

## 2016-01-05 MED ORDER — SODIUM CHLORIDE 0.9% FLUSH: 10.0000 mL | INTRAVENOUS | Status: DC | PRN

## 2016-01-05 NOTE — Progress Notes (Addendum)
   Daily Progress Note  Assessment/Planning: POD #2 s/p EVAR conversion to ABF for AAA and bilateral CIA aneurysm   Neuro: pain control better with PCA  CV: tachycardiac, suspect pain and some hemodilution, will transfuse 1 unit pRBC given significant CAD history  PULM: almost off [place] oxygen, continue neb, aggressive pulmonary toilet  GI: minimal bowel sound, NGT output dropping, if still down tomorrow, will remove  REN: UOP remains good, UOP 1.7 L/24 hr, pull foley per pt's wishes  FEN: on D5 1/2 NS @ 125 cc/hr  HEME/ID: low grade temp, likely ATX, H/H somewhat lower, suspect hemodilution  Overall, doing better than expected. Trsf to 3S  Subjective  - 2 Days Post-Op  Pain better with PCA  Objective Filed Vitals:   01/05/16 0700 01/05/16 0800 01/05/16 0900 01/05/16 0930  BP: 144/82 132/87 130/86   Pulse: 114 102 101   Temp: 99.5 F (37.5 C)     TempSrc: Oral     Resp: '18 16 14   '$ Height:      Weight:      SpO2: 92% 93% 94% 95%    Intake/Output Summary (Last 24 hours) at 01/05/16 1031 Last data filed at 01/05/16 0900  Gross per 24 hour  Intake   2975 ml  Output   2210 ml  Net    765 ml    PULM  BLL rales CV  RR, tachy GI  soft, appropriately TTP, ND, -G/R, bdg in place, NGT bilious 400 cc/24 hr VASC  B groin inc c/d/i, palpable bilateral AT pulses  Laboratory CBC    Component Value Date/Time   WBC 11.8* 01/05/2016 0400   HGB 8.9* 01/05/2016 0400   HCT 28.1* 01/05/2016 0400   PLT 150 01/05/2016 0400    BMET    Component Value Date/Time   NA 131* 01/05/2016 0400   K 4.3 01/05/2016 0400   CL 98* 01/05/2016 0400   CO2 27 01/05/2016 0400   GLUCOSE 125* 01/05/2016 0400   BUN 8 01/05/2016 0400   CREATININE 0.69 01/05/2016 0400   CREATININE 0.8 10/08/2015 1500   CALCIUM 8.0* 01/05/2016 0400   GFRNONAA >60 01/05/2016 0400   GFRAA >60 01/05/2016 0400    Adele Barthel, MD [facility] [place]: [phone] Pager:  [phone]  01/05/2016, 10:31 AM

## 2016-01-06 ENCOUNTER — Encounter (HOSPITAL_COMMUNITY): Payer: Self-pay | Admitting: Cardiology

## 2016-01-06 ENCOUNTER — Other Ambulatory Visit: Payer: Self-pay

## 2016-01-06 DIAGNOSIS — I4891 Unspecified atrial fibrillation: Secondary | ICD-10-CM

## 2016-01-06 DIAGNOSIS — I10 Essential (primary) hypertension: Secondary | ICD-10-CM

## 2016-01-06 DIAGNOSIS — I255 Ischemic cardiomyopathy: Secondary | ICD-10-CM

## 2016-01-06 DIAGNOSIS — I2510 Atherosclerotic heart disease of native coronary artery without angina pectoris: Secondary | ICD-10-CM

## 2016-01-06 HISTORY — DX: Unspecified atrial fibrillation: I48.91

## 2016-01-06 LAB — CBC
HCT: 30.7 % — ABNORMAL LOW (ref 39.0–52.0)
HEMOGLOBIN: 10.2 g/dL — AB (ref 13.0–17.0)
MCH: 28.7 pg (ref 26.0–34.0)
MCHC: 33.2 g/dL (ref 30.0–36.0)
MCV: 86.5 fL (ref 78.0–100.0)
Platelets: 144 10*3/uL — ABNORMAL LOW (ref 150–400)
RBC: 3.55 MIL/uL — AB (ref 4.22–5.81)
RDW: 14.6 % (ref 11.5–15.5)
WBC: 12 10*3/uL — AB (ref 4.0–10.5)

## 2016-01-06 LAB — GLUCOSE, CAPILLARY
GLUCOSE-CAPILLARY: 154 mg/dL — AB (ref 65–99)
GLUCOSE-CAPILLARY: 127 mg/dL — AB (ref 65–99)
GLUCOSE-CAPILLARY: 98 mg/dL (ref 65–99)
Glucose-Capillary: 97 mg/dL (ref 65–99)

## 2016-01-06 LAB — BASIC METABOLIC PANEL
Anion gap: 6 (ref 5–15)
BUN: 6 mg/dL (ref 6–20)
CHLORIDE: 98 mmol/L — AB (ref 101–111)
CO2: 26 mmol/L (ref 22–32)
Calcium: 8.1 mg/dL — ABNORMAL LOW (ref 8.9–10.3)
Creatinine, Ser: 0.55 mg/dL — ABNORMAL LOW (ref 0.61–1.24)
GFR calc Af Amer: >60 mL/min (ref >60)
GFR calc non Af Amer: >60 mL/min (ref >60)
GLUCOSE: 138 mg/dL — AB (ref 65–99)
POTASSIUM: 4 mmol/L (ref 3.5–5.1)
Sodium: 130 mmol/L — ABNORMAL LOW (ref 135–145)

## 2016-01-06 LAB — PHOSPHORUS: PHOSPHORUS: 1.9 mg/dL — AB (ref 2.5–4.6)

## 2016-01-06 LAB — TSH: TSH: 1.2 u[IU]/mL (ref 0.350–4.500)

## 2016-01-06 MED ORDER — METOPROLOL TARTRATE 50 MG PO TABS
50.0000 mg | ORAL_TABLET | Freq: Two times a day (BID) | ORAL | Status: DC
  Administered 2016-01-06 – 2016-01-11 (×11): 50 mg via ORAL
  Filled 2016-01-06 (×11): qty 1

## 2016-01-06 MED ORDER — HEPARIN (PORCINE) IN NACL 100-0.45 UNIT/ML-% IJ SOLN
1000.0000 [IU]/h | INTRAMUSCULAR | Status: DC
  Administered 2016-01-06: 750 [IU]/h via INTRAVENOUS
  Filled 2016-01-06: qty 500

## 2016-01-06 MED ORDER — LISINOPRIL 10 MG PO TABS
10.0000 mg | ORAL_TABLET | Freq: Every day | ORAL | Status: DC
  Administered 2016-01-06 – 2016-01-11 (×6): 10 mg via ORAL
  Filled 2016-01-06 (×6): qty 1

## 2016-01-06 MED ORDER — IPRATROPIUM-ALBUTEROL 0.5-2.5 (3) MG/3ML IN SOLN
3.0000 mL | Freq: Three times a day (TID) | RESPIRATORY_TRACT | Status: DC
  Administered 2016-01-06 – 2016-01-09 (×9): 3 mL via RESPIRATORY_TRACT
  Filled 2016-01-06 (×10): qty 3

## 2016-01-06 MED ORDER — AMIODARONE HCL IN DEXTROSE 360-4.14 MG/200ML-% IV SOLN
60.0000 mg/h | INTRAVENOUS | Status: DC
  Administered 2016-01-06 (×2): 60 mg/h via INTRAVENOUS
  Filled 2016-01-06: qty 200

## 2016-01-06 MED ORDER — AMIODARONE LOAD VIA INFUSION
150.0000 mg | Freq: Once | INTRAVENOUS | Status: AC
  Administered 2016-01-06: 150 mg via INTRAVENOUS
  Filled 2016-01-06: qty 83.34

## 2016-01-06 MED ORDER — NITROGLYCERIN 0.4 MG SL SUBL: 0.4000 mg | SUBLINGUAL_TABLET | SUBLINGUAL | Status: DC | PRN

## 2016-01-06 MED ORDER — ATORVASTATIN CALCIUM 80 MG PO TABS
80.0000 mg | ORAL_TABLET | Freq: Every day | ORAL | Status: DC
  Administered 2016-01-06 – 2016-01-10 (×5): 80 mg via ORAL
  Filled 2016-01-06 (×5): qty 1

## 2016-01-06 MED ORDER — HEPARIN BOLUS VIA INFUSION
4000.0000 [IU] | Freq: Once | INTRAVENOUS | Status: AC
  Administered 2016-01-06: 4000 [IU] via INTRAVENOUS
  Filled 2016-01-06: qty 4000

## 2016-01-06 MED ORDER — FLUTICASONE PROPIONATE 50 MCG/ACT NA SUSP
2.0000 | Freq: Every day | NASAL | Status: DC
  Administered 2016-01-10 – 2016-01-11 (×2): 2 via NASAL
  Filled 2016-01-06: qty 16

## 2016-01-06 MED ORDER — AMIODARONE HCL IN DEXTROSE 360-4.14 MG/200ML-% IV SOLN
30.0000 mg/h | INTRAVENOUS | Status: DC
  Administered 2016-01-07: 30 mg/h via INTRAVENOUS
  Filled 2016-01-06 (×2): qty 200
  Filled 2016-01-06: qty 400

## 2016-01-06 NOTE — Consult Note (Addendum)
Admit date: 4/27/20172 Referring Physician  Dr. Bridgett Larsson Primary Cardiologist   Reason for Consultation  Atrial fibrillation with RVR  HPI: [name] is a 70 y.o. male with history of PAFib noted once post DCCV of WCT, not placed on a/c, VT, AAA, CAD, HLD, tobacco abuse/COPD. The patient was in [facility] Feb 2016, at which time he was found with [facility] he had cardioversion into AFib that resolved to SR. Unfortunately during this hospiatl stay he signed out AMA prior to completion of his evaluation. Though did eventually get the cath done noting significant CAD without intervention. He was seen by EP, Dr. Caryl Comes May 2016 in the office with suspect VT as the [facility] given CAD/ICM and recommended ICD implant for secondary prevention, though this did not get done, it is unclear why, the patient states that it never got arranged, he isn't sure why, but did not f/u on it.  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 His external iliac arteries look too diseased to tolerate a aortobi-iliac graft. He was admitted to [facility] and udnerwent EVAR conversion to ABF for AAA and bilateral CIA aneurysm.  He is POD 3 # and this afternoon went into atrial fibrillation with RVR. Cardiology is now asked to consult for treatment.  Last echo in 2016 showed moderate LV dysfunction with EF  35-40% with moderate pulmonary HTN. Currently heart rate 110-124bpm .       PMH:   Past Medical History  Diagnosis Date  . Coronary artery disease     Multivessel, S/p stenting to LCx  . Tobacco dependence   . Ventricular dysfunction     Mild Left  . Dyslipidemia   . Atrial fibrillation (HCC)      Brief episode of  . Myocardial infarction ([place])   . COPD (chronic obstructive pulmonary  disease) ([place])   . Peripheral vascular disease ([place])   . Stroke ([place])   . Cardiomyopathy, ischemic 10/27/2014  . AAA (abdominal aortic aneurysm) ([place])   . Hypertension   . Anxiety   . Depression   . Hyperlipidemia   . Shortness of breath dyspnea   . Headache      PSH:   Past Surgical History  Procedure Laterality Date  . US echocardiography  12-26-2008    EF 40-45%  . Back surgery      cervical  . Stent      pt unsure of location  . Left heart catheterization with coronary angiogram N/A 10/27/2014    Procedure: LEFT HEART CATHETERIZATION WITH CORONARY ANGIOGRAM;  Surgeon: Blane Ohara, MD;  Location: [facility] CATH LAB;  Service: Cardiovascular;  Laterality: N/A;  . Wrist surgery Right   . Hand surgery Left     thumb surgery  . Hernia repair Right   . Abdominal aortic endovascular stent graft N/A 01/03/2016    Procedure: ABDOMINAL AORTIC ENDOVASCULAR STENT GRAFT IMPLANTED/EXPLANTED;  Surgeon: Conrad T[place], MD;  Location: [place];  Service: Vascular;  Laterality: N/A;  . Abdominal aortic aneurysm repair  01/03/2016    Procedure: ANEURYSM ABDOMINAL AORTIC REPAIR USING 14MM X 8MM X40CM HEMASHIELD GOLD GRAFT;  Surgeon: Conrad Conneaut [place], MD;  Location: [place];  Service: Vascular;;    Allergies:  Aspirin and Ibuprofen Prior to Admit Meds:   Prescriptions prior to admission  Medication Sig Dispense Refill Last Dose  . albuterol (PROAIR HFA) 108 (90 Base) MCG/ACT inhaler Inhale 1-2 puffs into the lungs every 6 (six) hours as needed for wheezing or shortness of breath.    01/03/2016 at Unknown time  . aspirin EC 81 MG tablet Take 81 mg by mouth daily.   01/02/2016 at Unknown time  . atorvastatin (LIPITOR) 80 MG tablet Take 1 tablet (80 mg total) by mouth daily at 6 PM. 30 tablet 5 01/02/2016 at Unknown time  . BREO ELLIPTA 100-25 MCG/INH AEPB Inhale 1 puff into the lungs daily.  1 01/03/2016 at Unknown time  . escitalopram (LEXAPRO) 20 MG tablet Take 20 mg by mouth daily.   01/02/2016 at Unknown time  .  lisinopril (PRINIVIL,ZESTRIL) 10 MG tablet Take 10 mg by mouth daily.   01/03/2016 at Unknown time  . metoprolol tartrate (LOPRESSOR) 25 MG tablet Take 2 tablets (50 mg total) by mouth 2 (two) times daily. 30 tablet 10 01/03/2016 at Unknown time  . mirtazapine (REMERON SOL-TAB) 30 MG disintegrating tablet Take 30 mg by mouth at bedtime.    01/02/2016 at Unknown time  . mometasone (NASONEX) 50 MCG/ACT nasal spray Place 2 sprays into the nose daily as needed (for sinus).    Past Week at Unknown time  . nitroGLYCERIN (NITROSTAT) 0.4 MG SL tablet Place 0.4 mg under the tongue every 5 (five) minutes as needed for chest pain.    Taking  . oxyCODONE (OXY IR/ROXICODONE) 5 MG immediate release tablet Take 5 mg by mouth every 4 (four) hours as needed for moderate pain.   0 01/02/2016 at Unknown time  . oxyCODONE-acetaminophen (PERCOCET) 10-325 MG tablet Take 1 tablet by mouth every 4 (four) hours as needed for pain.   0 01/03/2016 at Unknown time  . tiotropium (SPIRIVA) 18 MCG inhalation capsule Place 18 mcg into inhaler and inhale daily.   01/03/2016 at Unknown time  . tiZANidine (ZANAFLEX) 4 MG tablet Take 4-8 mg by mouth 2 (two) times daily. PT TAKES 2 TABLETS IN THE MORNING, 1 TABLET AT BEDTIME   01/03/2016 at Unknown time   Fam HX:    Family History  Problem Relation Age of Onset  . AAA (abdominal aortic aneurysm) Mother   . Cancer Father     bone marrow   Social HX:    Social History   Social History  . Marital Status: Widowed    Spouse Name: N/A  . Number of Children: 1  . Years of Education: N/A   Occupational History  . Not on file.   Social History Main Topics  . Smoking status: Current Every Day Smoker -- 0.50 packs/day for 50 years    Types: Cigarettes  . Smokeless tobacco: Never Used  . Alcohol Use: No  . Drug Use: No  . Sexual Activity: Not on file   Other Topics Concern  . Not on file   Social History Narrative     ROS:  All 11 ROS were addressed and are negative except what  is stated in the HPI  Physical Exam: Blood pressure 105/74, pulse 110, temperature 98.3 F (36.8 C), temperature source Oral, resp. rate 16, height '5\' 6"'$  (1.676 m), weight 138 lb 3.7 oz (62.7 kg), SpO2 94 %.    General: Well developed, well nourished, in no acute distress Head: Eyes PERRLA, No xanthomas.   Normal cephalic and atramatic  Lungs:   Clear bilaterally to auscultation and percussion. Heart:   Irregularly irregular S1 S2 Pulses  are 2+ & equal.            No carotid bruit. No JVD.  No abdominal bruits. No femoral bruits. Abdomen: Bowel sounds are positive, abdomen soft and non-tender without masses  Extremities:   No clubbing, cyanosis or edema.  DP +1 Neuro: Alert and oriented X 3. Psych:  Good affect, responds appropriately    Labs:   Lab Results  Component Value Date   WBC 12.0* 01/06/2016   HGB 10.2* 01/06/2016   HCT 30.7* 01/06/2016   MCV 86.5 01/06/2016   PLT 144* 01/06/2016    Recent Labs Lab 01/04/16 0420  01/06/16 0406  NA 134*  < > 130*  K 4.2  < > 4.0  CL 101  < > 98*  CO2 24  < > 26  BUN 10  < > 6  CREATININE 0.82  < > 0.55*  CALCIUM 8.0*  < > 8.1*  PROT 5.5*  --   --   BILITOT 0.7  --   --   ALKPHOS 56  --   --   ALT 13*  --   --   AST 24  --   --   GLUCOSE 216*  < > 138*  < > = values in this interval not displayed. No results found for: PTT Lab Results  Component Value Date   INR 1.33 01/03/2016   INR 1.18 01/03/2016   INR 1.23 11/22/2015   Lab Results  Component Value Date   CKTOTAL 1972* 09/02/2008   CKMB 336.1 RESULTS CONFIRMED BY MANUAL DILUTION* 09/02/2008   TROPONINI <0.03 01/04/2016     Lab Results  Component Value Date   CHOL 77 10/15/2014   CHOL 93 03/24/2014   CHOL 112 03/02/2013   Lab Results  Component Value Date   HDL 24* 10/15/2014   HDL 30.80* 03/24/2014   HDL 27.50* 03/02/2013   Lab Results  Component Value Date   LDLCALC 42 10/15/2014   LDLCALC 48 03/24/2014   LDLCALC 62 03/02/2013   Lab Results    Component Value Date   TRIG 68 01/03/2016   TRIG 54 10/15/2014   TRIG 72.0 03/24/2014   Lab Results  Component Value Date   CHOLHDL 3.2 10/15/2014   CHOLHDL 3 03/24/2014   CHOLHDL 4 03/02/2013   No results found for: LDLDIRECT    Radiology:  No results found.  EKG:  Atrial fibrillation with RVR at 120bpm  ASSESSMENT/PLAN:   1.  New onset atrial fibrillation with RVR post op from vascular surgery.  He has a history of LV dysfunction with EF 35-40% by echo in 2016 so will avoid Cardizem.  Will start Amio bolus '150mg'$  followed by gtt which will hopefully convert him back to NSR.  Will check a TSH.  Repeat echo since he has not had one done in a year. If ok with Vascular surgery would start on IV heparin gtt.  Mag and K are repleted.  Trop neg x 2. His CHADS2VASC score is at least 4 and he has a history of PAF in the past so would recommend longterm anticoagulation.  Could be changed to NOAC prior to discharge. 2.  ASCAD with history of PCI of LCx.  Continue ASA/statin/BB.  3.  Mild to moderate LV dysfunction by echo a year ago - will repeat echo.  Continue ACE I and BB. 4.  PVD per vascular POD #3 from EVAR conversion to ABF for AAA and bilateral CIA aneurysm 5.  Dyslipidemia on statin  therapy.  Sueanne Margarita, MD  01/06/2016  3:19 PM

## 2016-01-06 NOTE — Progress Notes (Addendum)
   Daily Progress Note  Assessment/Planning: POD #3 s/p EVAR conversion to ABF for AAA and bilateral CIA aneurysm   Keep PCA another day  If he tolerates PO, change to PO pain rx  Start home rx  Continue with neb  NGT d/c  Decreased MIVF to 50 cc/hr and start clears  Transfer to 2W ok   Subjective  - 3 Days Post-Op  Pain better  Objective Filed Vitals:   01/06/16 0048 01/06/16 0326 01/06/16 0810 01/06/16 0841  BP: 132/67 142/83 105/67   Pulse: 99 100 106   Temp: 98.2 F (36.8 C) 98.2 F (36.8 C) 99 F (37.2 C)   TempSrc: Oral Oral Oral   Resp: '17 13 12   '$ Height: '5\' 6"'$  (1.676 m)     Weight: 138 lb 3.7 oz (62.7 kg)     SpO2: 92% 91% 90% 92%    Intake/Output Summary (Last 24 hours) at 01/06/16 0926 Last data filed at 01/06/16 0900  Gross per 24 hour  Intake   3317 ml  Output   2975 ml  Net    342 ml    PULM  CTAB CV  RRR GI  soft, appropriately TTP, inc c/d/i, NGT removed VASC  B groins inc c/d/i, B DP palpable  Laboratory CBC    Component Value Date/Time   WBC 12.0* 01/06/2016 0406   HGB 10.2* 01/06/2016 0406   HCT 30.7* 01/06/2016 0406   PLT 144* 01/06/2016 0406    BMET    Component Value Date/Time   NA 130* 01/06/2016 0406   K 4.0 01/06/2016 0406   CL 98* 01/06/2016 0406   CO2 26 01/06/2016 0406   GLUCOSE 138* 01/06/2016 0406   BUN 6 01/06/2016 0406   CREATININE 0.55* 01/06/2016 0406   CREATININE 0.8 10/08/2015 1500   CALCIUM 8.1* 01/06/2016 0406   GFRNONAA >60 01/06/2016 0406   GFRAA >60 01/06/2016 0406    Adele Barthel, MD [facility] [place] Office: [phone] Pager: [phone]  01/06/2016, 9:26 AM  Addendum  Pt known to [facility].  He has a known history of prior episodes of afib with RVR.  I called their on-call physician earlier today.  Awaiting their input.   Adele Barthel, MD [facility] [place] Office: [phone] Pager: [phone]  01/06/2016, 3:17  PM

## 2016-01-06 NOTE — Progress Notes (Signed)
Amiodarone Drug - Drug Interaction Consult Note  Recommendations: Pt is on atorvastatin, which could increase risk of myopathy and pt should educated on muscle pain and weakness. With pt on metoprolol, watch closely for bradycardia.  Amiodarone is metabolized by the cytochrome P450 system and therefore has the potential to cause many drug interactions. Amiodarone has an average plasma half-life of 50 days (range 20 to 100 days).   There is potential for drug interactions to occur several weeks or months after stopping treatment and the onset of drug interactions may be slow after initiating amiodarone.   '[x]'$  Statins: Increased risk of myopathy. Simvastatin- restrict dose to '20mg'$  daily. Other statins: counsel patients to report any muscle pain or weakness immediately.  '[]'$  Anticoagulants: Amiodarone can increase anticoagulant effect. Consider warfarin dose reduction. Patients should be monitored closely and the dose of anticoagulant altered accordingly, remembering that amiodarone levels take several weeks to stabilize.  '[]'$  Antiepileptics: Amiodarone can increase plasma concentration of phenytoin, the dose should be reduced. Note that small changes in phenytoin dose can result in large changes in levels. Monitor patient and counsel on signs of toxicity.  '[x]'$  Beta blockers: increased risk of bradycardia, AV block and myocardial depression. Sotalol - avoid concomitant use.  '[]'$   Calcium channel blockers (diltiazem and verapamil): increased risk of bradycardia, AV block and myocardial depression.  '[]'$   Cyclosporine: Amiodarone increases levels of cyclosporine. Reduced dose of cyclosporine is recommended.  '[]'$  Digoxin dose should be halved when amiodarone is started.  '[]'$  Diuretics: increased risk of cardiotoxicity if hypokalemia occurs.  '[]'$  Oral hypoglycemic agents (glyburide, glipizide, glimepiride): increased risk of hypoglycemia. Patient's glucose levels should be monitored closely when  initiating amiodarone therapy.   '[]'$  Drugs that prolong the QT interval:  Torsades de pointes risk may be increased with concurrent use - avoid if possible.  Monitor QTc, also keep magnesium/potassium WNL if concurrent therapy can't be avoided. [name] Antibiotics: e.g. fluoroquinolones, erythromycin. . Antiarrhythmics: e.g. quinidine, procainamide, disopyramide, sotalol. . Antipsychotics: e.g. phenothiazines, haloperidol.  . Lithium, tricyclic antidepressants, and methadone.  Thank You,  Andrey Cota. Diona Foley, PharmD, [place] Clinical Pharmacist Pager [phone]  01/06/2016 3:40 PM

## 2016-01-06 NOTE — Progress Notes (Signed)
ANTICOAGULATION CONSULT NOTE - Initial Consult  Pharmacy Consult for Heparin Indication: atrial fibrillation  Allergies  Allergen Reactions  . Aspirin Nausea Only and Other (See Comments)    Stomach upset  . Ibuprofen Hives    Patient Measurements: Height: '5\' 6"'$  (167.6 cm) Weight: 138 lb 3.7 oz (62.7 kg) IBW/kg (Calculated) : 63.8 Heparin Dosing Weight: 63 kg  Vital Signs: Temp: 98.3 F (36.8 C) (04/30 1200) Temp Source: Oral (04/30 1200) BP: 105/74 mmHg (04/30 1300) Pulse Rate: 110 (04/30 1300)  Labs:  Recent Labs  01/03/16 2025 01/03/16 2230 01/04/16 0420 01/05/16 0400 01/06/16 0406  HGB 10.1*  --  10.1* 8.9* 10.2*  HCT 31.9*  --  31.1* 28.1* 30.7*  PLT 157  --  163 150 144*  APTT 42*  --   --   --   --   LABPROT 16.6*  --   --   --   --   INR 1.33  --   --   --   --   CREATININE 0.76  --  0.82 0.69 0.55*  TROPONINI  --  <0.03 <0.03  --   --     Estimated Creatinine Clearance: 78.4 mL/min (by C-G formula based on Cr of 0.55).   Medical History: Past Medical History  Diagnosis Date  . Coronary artery disease     Multivessel, S/p stenting to LCx  . Tobacco dependence   . Ventricular dysfunction     Mild Left  . Dyslipidemia   . Atrial fibrillation (HCC)      Brief episode of  . Myocardial infarction ([place])   . COPD (chronic obstructive pulmonary disease) ([place])   . Peripheral vascular disease ([place])   . Stroke ([place])   . Cardiomyopathy, ischemic 10/27/2014  . AAA (abdominal aortic aneurysm) ([place])   . Hypertension   . Anxiety   . Depression   . Hyperlipidemia   . Shortness of breath dyspnea   . Headache   . Atrial fibrillation with rapid ventricular response ([place]) 01/06/2016    Medications:  Prescriptions prior to admission  Medication Sig Dispense Refill Last Dose  . albuterol (PROAIR HFA) 108 (90 Base) MCG/ACT inhaler Inhale 1-2 puffs into the lungs every 6 (six) hours as needed for wheezing or shortness of breath.    01/03/2016 at Unknown time   . aspirin EC 81 MG tablet Take 81 mg by mouth daily.   01/02/2016 at Unknown time  . atorvastatin (LIPITOR) 80 MG tablet Take 1 tablet (80 mg total) by mouth daily at 6 PM. 30 tablet 5 01/02/2016 at Unknown time  . BREO ELLIPTA 100-25 MCG/INH AEPB Inhale 1 puff into the lungs daily.  1 01/03/2016 at Unknown time  . escitalopram (LEXAPRO) 20 MG tablet Take 20 mg by mouth daily.   01/02/2016 at Unknown time  . lisinopril (PRINIVIL,ZESTRIL) 10 MG tablet Take 10 mg by mouth daily.   01/03/2016 at Unknown time  . metoprolol tartrate (LOPRESSOR) 25 MG tablet Take 2 tablets (50 mg total) by mouth 2 (two) times daily. 30 tablet 10 01/03/2016 at Unknown time  . mirtazapine (REMERON SOL-TAB) 30 MG disintegrating tablet Take 30 mg by mouth at bedtime.    01/02/2016 at Unknown time  . mometasone (NASONEX) 50 MCG/ACT nasal spray Place 2 sprays into the nose daily as needed (for sinus).    Past Week at Unknown time  . nitroGLYCERIN (NITROSTAT) 0.4 MG SL tablet Place 0.4 mg under the tongue every 5 (five) minutes as needed for  chest pain.    Taking  . oxyCODONE (OXY IR/ROXICODONE) 5 MG immediate release tablet Take 5 mg by mouth every 4 (four) hours as needed for moderate pain.   0 01/02/2016 at Unknown time  . oxyCODONE-acetaminophen (PERCOCET) 10-325 MG tablet Take 1 tablet by mouth every 4 (four) hours as needed for pain.   0 01/03/2016 at Unknown time  . tiotropium (SPIRIVA) 18 MCG inhalation capsule Place 18 mcg into inhaler and inhale daily.   01/03/2016 at Unknown time  . tiZANidine (ZANAFLEX) 4 MG tablet Take 4-8 mg by mouth 2 (two) times daily. PT TAKES 2 TABLETS IN THE MORNING, 1 TABLET AT BEDTIME   01/03/2016 at Unknown time   Scheduled:  . sodium chloride   Intravenous Once  . amiodarone  150 mg Intravenous Once  . antiseptic oral rinse  7 mL Mouth Rinse BID  . atorvastatin  80 mg Oral q1800  . budesonide (PULMICORT) nebulizer solution  0.5 mg Nebulization BID  . docusate sodium  100 mg Oral Daily  .  fluticasone  2 spray Each Nare Daily  . insulin aspart  0-9 Units Subcutaneous TID WC  . ipratropium-albuterol  3 mL Nebulization TID  . lisinopril  10 mg Oral Daily  . metoprolol tartrate  50 mg Oral BID  . morphine   Intravenous Q4H  . pantoprazole  40 mg Oral Daily  . sodium chloride flush  10-40 mL Intracatheter Q12H   Infusions:  . amiodarone     Followed by  . amiodarone    . dextrose 5 % and 0.45 % NaCl with KCl 20 mEq/L 50 mL/hr at 01/06/16 3546    Assessment: 70yo male presents in Afib with RVR. Pharmacy is consulted to dose heparin for atrial fibrillation. Hgb 10.2, Plt 144, sCr 0.55.  Goal of Therapy:  Heparin level 0.3-0.7 units/ml Monitor platelets by anticoagulation protocol: Yes   Plan:  Give 4000 units bolus x 1 Start heparin infusion at 750 units/hr Check anti-Xa level in 6 hours and daily while on heparin Continue to monitor H&H and platelets  Andrey Cota. Diona Foley, PharmD, [place] Clinical Pharmacist Pager [phone] 01/06/2016,3:55 PM

## 2016-01-06 NOTE — Progress Notes (Signed)
OT Cancellation Note  Patient Details Name: [name] MRN: [id number] DOB: 12/03/1945   Cancelled Treatment:    Reason Eval/Treat Not Completed: Medical issues which prohibited therapy. Pt with new Afib with RVR today with HR in 110-120s as I was watching the monitor. Spoke with RN who is just starting heparin and amiodarone--RN and I are in agreement that we need to wait on OT eval for now.       Almon Register [phone] 01/06/2016, 4:14 PM

## 2016-01-06 NOTE — Progress Notes (Signed)
Notified by CCM that pt in atrial fibrillation- HR 120-160.  EKG obtained showing atrial fibrillation.  Pt asymptomatic.  BP stable.  Notified Dr. Bridgett Larsson.  Will continue to monitor pt.

## 2016-01-07 DIAGNOSIS — I714 Abdominal aortic aneurysm, without rupture: Principal | ICD-10-CM

## 2016-01-07 DIAGNOSIS — I5020 Unspecified systolic (congestive) heart failure: Secondary | ICD-10-CM

## 2016-01-07 LAB — TYPE AND SCREEN
ABO/RH(D): O POS
ANTIBODY SCREEN: NEGATIVE
UNIT DIVISION: 0
UNIT DIVISION: 0
Unit division: 0
Unit division: 0
Unit division: 0

## 2016-01-07 LAB — GLUCOSE, CAPILLARY
GLUCOSE-CAPILLARY: 123 mg/dL — AB (ref 65–99)
GLUCOSE-CAPILLARY: 112 mg/dL — AB (ref 65–99)
Glucose-Capillary: 102 mg/dL — ABNORMAL HIGH (ref 65–99)
Glucose-Capillary: 106 mg/dL — ABNORMAL HIGH (ref 65–99)

## 2016-01-07 LAB — CBC
HEMATOCRIT: 28.4 % — AB (ref 39.0–52.0)
HEMOGLOBIN: 9.4 g/dL — AB (ref 13.0–17.0)
MCH: 28.7 pg (ref 26.0–34.0)
MCHC: 33.1 g/dL (ref 30.0–36.0)
MCV: 86.6 fL (ref 78.0–100.0)
Platelets: 168 10*3/uL (ref 150–400)
RBC: 3.28 MIL/uL — ABNORMAL LOW (ref 4.22–5.81)
RDW: 14.5 % (ref 11.5–15.5)
WBC: 6.2 10*3/uL (ref 4.0–10.5)

## 2016-01-07 LAB — PHOSPHORUS: Phosphorus: 3.2 mg/dL (ref 2.5–4.6)

## 2016-01-07 LAB — HEPARIN LEVEL (UNFRACTIONATED): Heparin Unfractionated: <0.1 IU/mL — ABNORMAL LOW (ref 0.30–0.70)

## 2016-01-07 LAB — MRSA PCR SCREENING: MRSA by PCR: NEGATIVE

## 2016-01-07 MED ORDER — OXYCODONE-ACETAMINOPHEN 5-325 MG PO TABS
1.0000 | ORAL_TABLET | ORAL | Status: DC | PRN
  Administered 2016-01-07: 2 via ORAL
  Administered 2016-01-07: 1 via ORAL
  Administered 2016-01-08 – 2016-01-11 (×9): 2 via ORAL
  Filled 2016-01-07 (×10): qty 2
  Filled 2016-01-07: qty 1
  Filled 2016-01-07: qty 2

## 2016-01-07 MED ORDER — HEPARIN (PORCINE) IN NACL 100-0.45 UNIT/ML-% IJ SOLN
1500.0000 [IU]/h | INTRAMUSCULAR | Status: DC
  Administered 2016-01-08 – 2016-01-09 (×2): 1500 [IU]/h via INTRAVENOUS
  Filled 2016-01-07 (×3): qty 250

## 2016-01-07 MED ORDER — BISACODYL 10 MG RE SUPP
10.0000 mg | Freq: Once | RECTAL | Status: AC
  Administered 2016-01-07: 10 mg via RECTAL
  Filled 2016-01-07: qty 1

## 2016-01-07 NOTE — Clinical Social Work Note (Signed)
Clinical Social Work Assessment  Patient Details  Name: [name] MRN: [id number] Date of Birth: 03-Nov-1945  Date of referral:  01/04/16               Reason for consult:  Facility Placement, Discharge Planning                Permission sought to share information with:  Other (None) Permission granted to share information::  No  Name::        Agency::     Relationship::     Contact Information:     Housing/Transportation Living arrangements for the past 2 months:  Single Family Home Source of Information:  Patient, Medical Team Patient Interpreter Needed:  None Criminal Activity/Legal Involvement Pertinent to Current Situation/Hospitalization:  No - Comment as needed Significant Relationships:  Adult Children, Other(Comment) (Grandchildren, Brother-in-law) Lives with:  Other (Comment) (Granddaughter, Brother-in-law) Do you feel safe going back to the place where you live?  Yes Need for family participation in patient care:  Yes (Comment)  Care giving concerns:  PT recommends SNF placement at discharge.   Social Worker assessment / plan:  CSW met with patient. Nurse at bedside. CSW introduced role and explained that discharge planning would be discussed. Discussed PT's SNF recommendation. Patient refused SNF due to being the caregiver for his brother-in-law. His granddaughter assists with this as well. Patient does not want home health either. CSW made RNCM aware. Patient expressed that he plans to join a gym soon. No further concerns. CSW will continue to follow for support and facilitate discharge if he decides to agree to SNF recommendation.   Employment status:  Retired Forensic scientist:  Medicare PT Recommendations:  [place] / Referral to community resources:   (None. Patient does not want SNF.)  Patient/Family's Response to care:  Patient not willing to go to SNF. Patient appreciative of social work intervention.  Patient/Family's  Understanding of and Emotional Response to Diagnosis, Current Treatment, and Prognosis:  Patient and his family knowledgeable of medical interventions and aware of SNF recommendation following discharge.   Emotional Assessment Appearance:  Appears stated age Attitude/Demeanor/Rapport:   (Pleasant.) Affect (typically observed):  Appropriate, Calm, Pleasant Orientation:  Oriented to Self, Oriented to Place, Oriented to  Time, Oriented to Situation Alcohol / Substance use:  Tobacco Use Psych involvement (Current and /or in the community):  No (Comment)  Discharge Needs  Concerns to be addressed:  Patient refuses services, Care Coordination Readmission within the last 30 days:  No Current discharge risk:  Dependent with Mobility Barriers to Discharge:  Other (Refuses SNF and home health)   Candie Chroman, LCSW 01/07/2016, 4:21 PM

## 2016-01-07 NOTE — Care Management Important Message (Signed)
Important Message  Patient Details  Name: [name] MRN: [id number] Date of Birth: May 25, 1946   Medicare Important Message Given:  Yes    Nathen May 01/07/2016, 4:18 PM

## 2016-01-07 NOTE — Evaluation (Addendum)
Occupational Therapy Evaluation Patient Details Name: [name] MRN: [id number] DOB: 23-Nov-1945 Today's Date: 01/07/2016    History of Present Illness Pt adm with AAA and bilateral common iliac artery aneurysms. Pt underwent extensive endovascular and then open repair of aneurysms and other varios bypasses on 01/03/16. Pt self extubated on 01/04/16. PMH - COPD, PVD, CAD, MI, Stroke   Clinical Impression  Patient is s/p aneurysms and varios bypass surgery resulting in functional limitations due to the deficits listed below (see OT problem list). PTA was independent with all adls. Patient will benefit from skilled OT acutely to increase independence and safety with ADLS to allow discharge SNF.  Oxygen desaturation to 74% on 2L with RW mobilized  Oxygen incr to 4L and increased 91% Pt requires use of oxygen throughout session     Follow Up Recommendations  SNF;Supervision/Assistance - 24 hour    Equipment Recommendations  Other (comment) (defer to SNF)    Recommendations for Other Services       Precautions / Restrictions Precautions Precautions: Fall      Mobility Bed Mobility               General bed mobility comments: in chair on arrival  Transfers Overall transfer level: Needs assistance   Transfers: Sit to/from Stand Sit to Stand: Min assist         General transfer comment: pt able to power up from chair using bil UE on arm rest for support but not pushing    Balance Overall balance assessment: Needs assistance         Standing balance support: Bilateral upper extremity supported;During functional activity Standing balance-Leahy Scale: Poor                              ADL Overall ADL's : Needs assistance/impaired Eating/Feeding: Set up;Sitting   Grooming: Wash/dry face;Set up;Sitting               Lower Body Dressing: Moderate assistance Lower Body Dressing Details (indicate cue type and reason): don socks with  crossing bil LE Toilet Transfer: Minimal assistance           Functional mobility during ADLs: Min guard;Rolling walker General ADL Comments: Pt with vitals monitored closely and demonstrates decr oxygen with mobility during session. pt able to don socks in static sitting position with incr time     Vision Vision Assessment?: No apparent visual deficits   Perception     Praxis      Pertinent Vitals/Pain Pain Assessment: No/denies pain     Hand Dominance Right   Extremity/Trunk Assessment Upper Extremity Assessment Upper Extremity Assessment: Generalized weakness   Lower Extremity Assessment Lower Extremity Assessment: Defer to PT evaluation   Cervical / Trunk Assessment Cervical / Trunk Assessment: Normal   Communication Communication Communication: No difficulties   Cognition Arousal/Alertness: Awake/alert Behavior During Therapy: WFL for tasks assessed/performed Overall Cognitive Status: Within Functional Limits for tasks assessed                     General Comments       Exercises       Shoulder Instructions      Home Living Family/patient expects to be discharged to:: Private residence Living Arrangements: Children Available Help at Discharge: Family;Available PRN/intermittently Type of Home: House Home Access: Level entry     Home Layout: One level  Home Equipment: [place] - 2 wheels;Wheelchair - manual          Prior Functioning/Environment Level of Independence: Independent             OT Diagnosis: Generalized weakness;Acute pain   OT Problem List: Decreased strength;Decreased activity tolerance;Impaired balance (sitting and/or standing);Decreased safety awareness;Decreased knowledge of use of DME or AE;Decreased knowledge of precautions;Pain;Cardiopulmonary status limiting activity   OT Treatment/Interventions: Self-care/ADL training;Therapeutic exercise;Energy conservation;DME and/or AE  instruction;Therapeutic activities;Patient/family education;Balance training    OT Goals(Current goals can be found in the care plan section) Acute Rehab OT Goals Patient Stated Goal: none stated OT Goal Formulation: With patient Time For Goal Achievement: 01/21/16 Potential to Achieve Goals: Good  OT Frequency: Min 2X/week   Barriers to D/C:            Co-evaluation              End of Session Equipment Utilized During Treatment: Gait belt;Oxygen;Rolling walker Nurse Communication: Mobility status;Precautions  Activity Tolerance: Patient tolerated treatment well Patient left: in chair;with call bell/phone within reach;with nursing/sitter in room   Time: 0932-6712 OT Time Calculation (min): 23 min Charges:  OT General Charges $OT Visit: 1 Procedure OT Evaluation $OT Eval High Complexity: 1 Procedure G-Codes:    Peri Maris Jan 30, 2016, 1:48 PM   Jeri Modena   OTR/L Pager: [phone] Office: [phone] .

## 2016-01-07 NOTE — Progress Notes (Signed)
ANTICOAGULATION CONSULT NOTE - Follow Up Consult  Pharmacy Consult:  Heparin  Indication: atrial fibrillation  Allergies  Allergen Reactions  . Aspirin Nausea Only and Other (See Comments)    Stomach upset  . Ibuprofen Hives    Patient Measurements: Height: '5\' 6"'$  (167.6 cm) Weight: 138 lb 3.7 oz (62.7 kg) IBW/kg (Calculated) : 63.8  Heparin dosing weight = 63 kg  Vital Signs: Temp: 98.2 F (36.8 C) (05/01 0718) Temp Source: Oral (05/01 0718) BP: 113/72 mmHg (05/01 0800) Pulse Rate: 74 (05/01 0800)  Labs:  Recent Labs  01/05/16 0400 01/06/16 0406 01/06/16 2330 01/07/16 0800  HGB 8.9* 10.2*  --  9.4*  HCT 28.1* 30.7*  --  28.4*  PLT 150 144*  --  168  HEPARINUNFRC  --   --  <0.10* <0.10*  CREATININE 0.69 0.55*  --   --     Estimated Creatinine Clearance: 78.4 mL/min (by C-G formula based on Cr of 0.55).    Assessment: 76 YOM to continue on IV heparin for new-onset Afib post vascular surgery on 01/04/16.  Heparin level is undetectable; no bleeding reported.  Heparin infusing without complications and no bleeding reported by RN.   Goal of Therapy:  Heparin level 0.3-0.7 units/ml Monitor platelets by anticoagulation protocol: Yes    Plan:  - Increase heparin gtt to 1250 units/hr - Daily HL / CBC - F/U resuming home meds: ASA, Lexapro, Remeron, tizanidine   Maryann Mccall D. Mina Marble, PharmD, BCPS Pager:  [phone] 01/07/2016, 10:57 AM

## 2016-01-07 NOTE — Progress Notes (Signed)
ANTICOAGULATION CONSULT NOTE - Follow Up Consult  Pharmacy Consult:  Heparin  Indication: atrial fibrillation  Allergies  Allergen Reactions  . Aspirin Nausea Only and Other (See Comments)    Stomach upset  . Ibuprofen Hives    Patient Measurements: Height: '5\' 6"'$  (167.6 cm) Weight: 138 lb 3.7 oz (62.7 kg) IBW/kg (Calculated) : 63.8  Heparin dosing weight = 63 kg  Vital Signs: Temp: 98.3 F (36.8 C) (05/01 1900) Temp Source: Oral (05/01 1900) BP: 106/67 mmHg (05/01 1900) Pulse Rate: 53 (05/01 1910)  Labs:  Recent Labs  01/05/16 0400 01/06/16 0406 01/06/16 2330 01/07/16 0800 01/07/16 1823  HGB 8.9* 10.2*  --  9.4*  --   HCT 28.1* 30.7*  --  28.4*  --   PLT 150 144*  --  168  --   HEPARINUNFRC  --   --  <0.10* <0.10* <0.10*  CREATININE 0.69 0.55*  --   --   --     Estimated Creatinine Clearance: 78.4 mL/min (by C-G formula based on Cr of 0.55).   Assessment: 89 YOM to continue on IV heparin for new-onset Afib post vascular surgery on 01/04/16.  Heparin level is undetectable x3; no bleeding reported. Spoke with RN who checked the IV site and there are no problems, infusion rate is also correct in the pump.  Goal of Therapy:  Heparin level 0.3-0.7 units/ml Monitor platelets by anticoagulation protocol: Yes   Plan:  - Increase heparin drip to 1500 units/hr - 6 hr HL - Daily HL / CBC - F/U resuming home meds: ASA, Lexapro, Remeron, tizanidine   [place], Pharm.D., BCPS Clinical Pharmacist Pager: [phone] 01/07/2016 7:42 PM

## 2016-01-07 NOTE — Progress Notes (Signed)
ANTICOAGULATION CONSULT NOTE - Follow Up Consult  Pharmacy Consult for Heparin  Indication: atrial fibrillation  Allergies  Allergen Reactions  . Aspirin Nausea Only and Other (See Comments)    Stomach upset  . Ibuprofen Hives    Patient Measurements: Height: '5\' 6"'$  (167.6 cm) Weight: 138 lb 3.7 oz (62.7 kg) IBW/kg (Calculated) : 63.8  Vital Signs: Temp: 99.5 F (37.5 C) (04/30 2301) Temp Source: Oral (04/30 2301) BP: 99/58 mmHg (05/01 0000) Pulse Rate: 80 (05/01 0000)  Labs:  Recent Labs  01/04/16 0420 01/05/16 0400 01/06/16 0406 01/06/16 2330  HGB 10.1* 8.9* 10.2*  --   HCT 31.1* 28.1* 30.7*  --   PLT 163 150 144*  --   HEPARINUNFRC  --   --   --  <0.10*  CREATININE 0.82 0.69 0.55*  --   TROPONINI <0.03  --   --   --     Estimated Creatinine Clearance: 78.4 mL/min (by C-G formula based on Cr of 0.55).   Assessment: On heparin for afib, POD #4 vascular surgery, initial heparin level is undetectable, no issues per RN.   Goal of Therapy:  Heparin level 0.3-0.7 units/ml Monitor platelets by anticoagulation protocol: Yes   Plan:  -Will avoid bolus with recent vascular surgery -Increase heparin to 1000 units/hr -1000 HL  Bethel Gaglio 01/07/2016,1:35 AM

## 2016-01-07 NOTE — Care Management Note (Addendum)
Case Management Note  Patient Details  Name: [name] MRN: [id number] Date of Birth: 23-May-1946  Subjective/Objective:  Patient is s/pe endovascular aneurysm repair for AAA.  Post operatively complicated by afib with rvr, conts on amiodarone and heparin drip, will d/c pca and transition to po's.  Advance diet .  Per pt /ot eval rec SNF , patient is refusing snf, stating he will go home with son who works, while son is working his brother [name] will be with him. NCM spoke with patient about setting up [facility] services for him when he gets home if he does not want snf patient states he does not want [facility] services.  NCM will check back with patient tomorrow to see if he has changed his mind.    5/2 1433- NCM spoke with patient, he states he still does not want [facility] services.  NCM will cont to follow for dc needs, he may need home oxygen at dc.                 Action/Plan:   Expected Discharge Date:  01/12/16               Expected Discharge Plan:  Skilled Nursing Facility  In-House Referral:  Clinical Social Work  Discharge planning Services  CM Consult  Post Acute Care Choice:    Choice offered to:     DME Arranged:    DME Agency:     HH Arranged:    [place] Agency:     Status of Service:  In process, will continue to follow  Medicare Important Message Given:  Yes Date Medicare IM Given:    Medicare IM give by:    Date Additional Medicare IM Given:    Additional Medicare Important Message give by:     If discussed at [place] of Stay Meetings, dates discussed:    Additional Comments:  Zenon Mayo, RN 01/07/2016, 7:56 PM

## 2016-01-07 NOTE — Progress Notes (Signed)
Physical Therapy Treatment Patient Details Name: [name] MRN: [id number] DOB: March 20, 1946 Today's Date: 01/07/2016    History of Present Illness Pt adm with AAA and bilateral common iliac artery aneurysms. Pt underwent extensive endovascular and then open repair of aneurysms and other varios bypasses on 01/03/16. Pt self extubated on 01/04/16. PMH - COPD, PVD, CAD, MI, Stroke    PT Comments    Pt is progressing nicely with mobility and was min assist. He was able to walk the entire unit, but does seem to need supplemental O2 support during gait as his sats decrease on 2 L O2 [place] (86%).  PT will continue to follow acutely.    Follow Up Recommendations  SNF     Equipment Recommendations  None recommended by PT    Recommendations for Other Services   NA     Precautions / Restrictions Precautions Precautions: Fall Precaution Comments: due to lines and weakness    Mobility  Bed Mobility Overal bed mobility: Needs Assistance Bed Mobility: Rolling;Sidelying to Sit;Sit to Supine Rolling: Modified independent (Device/Increase time) Sidelying to sit: Min guard   Sit to supine: Min guard   General bed mobility comments: Pt able to roll using bed rail and min guard assist to help with legs and trunk to get EOB and back into bed.  Pt using pillow to guard abdomen during transitions.   Transfers Overall transfer level: Needs assistance Equipment used: Rolling walker (2 wheeled) Transfers: Sit to/from Stand Sit to Stand: Min guard         General transfer comment: Min guard assist for safety during transitions.   Ambulation/Gait Ambulation/Gait assistance: Supervision Ambulation Distance (Feet): 300 Feet Assistive device: Rolling walker (2 wheeled) Gait Pattern/deviations: Step-through pattern;Trunk flexed     General Gait Details: Pt with steady gait with RW for support, verbal cues for upright posture.           Balance Overall balance assessment: Needs  assistance Sitting-balance support: Feet supported;Bilateral upper extremity supported Sitting balance-Leahy Scale: Good     Standing balance support: Bilateral upper extremity supported Standing balance-Leahy Scale: Fair                      Cognition Arousal/Alertness: Awake/alert Behavior During Therapy: WFL for tasks assessed/performed Overall Cognitive Status: Within Functional Limits for tasks assessed                             Pertinent Vitals/Pain Pain Assessment: Faces Faces Pain Scale: Hurts even more Pain Location: abdomen Pain Descriptors / Indicators: Grimacing;Guarding Pain Intervention(s): Limited activity within patient's tolerance;Monitored during session;Repositioned    Home Living Family/patient expects to be discharged to:: Private residence Living Arrangements: Children Available Help at Discharge: Family;Available PRN/intermittently Type of Home: House Home Access: Level entry   Home Layout: One level Home Equipment: Walker - 2 wheels;Wheelchair - manual      Prior Function Level of Independence: Independent          PT Goals (current goals can now be found in the care plan section) Acute Rehab PT Goals Patient Stated Goal: to get back to walking without a RW. Progress towards PT goals: Progressing toward goals    Frequency  Min 3X/week    PT Plan Current plan remains appropriate       End of Session Equipment Utilized During Treatment: Oxygen Activity Tolerance: Patient limited by pain Patient left: in bed;with call bell/phone within reach  Time: 4446-1901 PT Time Calculation (min) (ACUTE ONLY): 22 min  Charges:  $Gait Training: 8-22 mins                      Evanny Ellerbe B. Hoskins, [place], DPT [phone]   01/07/2016, 3:58 PM

## 2016-01-07 NOTE — Progress Notes (Signed)
Patient Profile: 70 y.o. male with history of PAFib noted once post DCCV of WCT, not placed on a/c, VT, chronic systolic HF, AAA, CAD, HLD,  and tobacco abuse/COPD, admitted for EVAR for AAA. Post-op course complicated by atrial fibrillation w/ RVR.  Subjective: Doing well. No major complaints. Just finished physical therapy. No issues.   Objective: Vital signs in last 24 hours: Temp:  [98.2 F (36.8 C)-99.9 F (37.7 C)] 98.2 F (36.8 C) (05/01 0718) Pulse Rate:  [58-120] 74 (05/01 0800) Resp:  [9-18] 11 (05/01 0800) BP: (92-124)/(57-74) 113/72 mmHg (05/01 0800) SpO2:  [93 %-100 %] 94 % (05/01 0800)    Intake/Output from previous day: 04/30 0701 - 05/01 0700 In: 2595.4 [P.O.:660; I.V.:1935.4] Out: 2250 [Urine:2250] Intake/Output this shift: Total I/O In: 200.1 [I.V.:200.1] Out: -   Medications Current Facility-Administered Medications  Medication Dose Route Frequency Provider Last Rate Last Dose  . 0.9 %  sodium chloride infusion  500 mL Intravenous Once PRN Alvia Grove, PA-C      . 0.9 %  sodium chloride infusion   Intravenous Once Conrad [place], MD      . acetaminophen (TYLENOL) tablet 325-650 mg  325-650 mg Oral Q4H PRN Alvia Grove, PA-C       Or  . acetaminophen (TYLENOL) suppository 325-650 mg  325-650 mg Rectal Q4H PRN Alvia Grove, PA-C      . albuterol (PROVENTIL) (2.5 MG/3ML) 0.083% nebulizer solution 3 mL  3 mL Inhalation Q3H PRN Rahul P Desai, PA-C      . alum & mag hydroxide-simeth (MAALOX/MYLANTA) 200-200-20 MG/5ML suspension 15-30 mL  15-30 mL Oral Q2H PRN Alvia Grove, PA-C      . amiodarone (NEXTERONE PREMIX) 360-4.14 MG/200ML-% (1.8 mg/mL) IV infusion  30 mg/hr Intravenous Continuous Sueanne Margarita, MD 16.7 mL/hr at 01/07/16 0600 30 mg/hr at 01/07/16 0600  . antiseptic oral rinse (CPC / CETYLPYRIDINIUM CHLORIDE 0.05%) solution 7 mL  7 mL Mouth Rinse BID Conrad [place], MD   7 mL at 01/07/16 1023  . atorvastatin (LIPITOR) tablet 80 mg  80 mg  Oral q1800 Conrad [place], MD   80 mg at 01/06/16 1755  . bisacodyl (DULCOLAX) suppository 10 mg  10 mg Rectal Daily PRN Alvia Grove, PA-C      . budesonide (PULMICORT) nebulizer solution 0.5 mg  0.5 mg Nebulization BID Rahul P Desai, PA-C   0.5 mg at 01/06/16 2020  . dextrose 5 % and 0.45 % NaCl with KCl 20 mEq/L infusion   Intravenous Continuous Conrad [place], MD 50 mL/hr at 01/07/16 0600    . diphenhydrAMINE (BENADRYL) injection 12.5 mg  12.5 mg Intravenous Q6H PRN Conrad [place], MD       Or  . diphenhydrAMINE (BENADRYL) 12.5 MG/5ML elixir 12.5 mg  12.5 mg Oral Q6H PRN Conrad [place], MD      . docusate sodium (COLACE) capsule 100 mg  100 mg Oral Daily Alvia Grove, PA-C   100 mg at 01/07/16 0816  . fluticasone (FLONASE) 50 MCG/ACT nasal spray 2 spray  2 spray Each Nare Daily Conrad , MD   2 spray at 01/06/16 1027  . heparin ADULT infusion 100 units/mL (25000 units/250 mL)  1,250 Units/hr Intravenous Continuous Tyrone Apple, RPH 12.5 mL/hr at 01/07/16 1108 1,250 Units/hr at 01/07/16 1108  . hydrALAZINE (APRESOLINE) injection 5 mg  5 mg Intravenous Q20 Min PRN Alvia Grove, PA-C      .  insulin aspart (novoLOG) injection 0-9 Units  0-9 Units Subcutaneous TID WC Hulen Shouts [place]hy[place], PA-C   1 Units at 01/06/16 1755  . ipratropium-albuterol (DUONEB) 0.5-2.5 (3) MG/3ML nebulizer solution 3 mL  3 mL Nebulization TID Conrad [place], MD   3 mL at 01/06/16 2020  . labetalol (NORMODYNE,TRANDATE) injection 10 mg  10 mg Intravenous Q10 min PRN Alvia Grove, PA-C   10 mg at 01/03/16 2240  . lisinopril (PRINIVIL,ZESTRIL) tablet 10 mg  10 mg Oral Daily Conrad [place], MD   10 mg at 01/07/16 0816  . metoprolol (LOPRESSOR) injection 2-5 mg  2-5 mg Intravenous Q2H PRN Alvia Grove, PA-C      . metoprolol (LOPRESSOR) tablet 50 mg  50 mg Oral BID Conrad [place], MD   50 mg at 01/07/16 0816  . morphine (MORPHINE) 2 mg/mL PCA injection   Intravenous Q4H Conrad [place], MD   6 mg at 01/06/16 0400  . naloxone  [facility]) injection 0.4 mg  0.4 mg Intravenous PRN Conrad [place], MD       And  . sodium chloride flush (NS) 0.9 % injection 9 mL  9 mL Intravenous PRN Conrad [place], MD      . nitroGLYCERIN (NITROSTAT) SL tablet 0.4 mg  0.4 mg Sublingual Q5 min PRN Conrad [place], MD      . ondansetron [facility]) injection 4 mg  4 mg Intravenous Q6H PRN Conrad [place], MD      . oxyCODONE-acetaminophen (PERCOCET/ROXICET) 5-325 MG per tablet 1-2 tablet  1-2 tablet Oral Q4H PRN Gabriel [place], PA-C   1 tablet at 01/07/16 0817  . pantoprazole (PROTONIX) EC tablet 40 mg  40 mg Oral Daily Alvia Grove, PA-C   40 mg at 01/07/16 0816  . phenol (CHLORASEPTIC) mouth spray 1 spray  1 spray Mouth/Throat PRN Alvia Grove, PA-C      . potassium chloride SA (K-DUR,KLOR-CON) CR tablet 20-40 mEq  20-40 mEq Oral Daily PRN Alvia Grove, PA-C      . sodium chloride flush (NS) 0.9 % injection 10-40 mL  10-40 mL Intracatheter Q12H Conrad [place], MD   10 mL at 01/07/16 1000    PE: General appearance: alert, cooperative, no distress and appears older than age, frail Neck: no carotid bruit and no JVD Lungs: clear to auscultation bilaterally Heart: irregularly irregular rhythm and regular rate Extremities: no LEE, bilateral SCDs present Pulses: 2+ and symmetric Skin: warm and dry Neurologic: Grossly normal  Lab Results:   Recent Labs  01/05/16 0400 01/06/16 0406 01/07/16 0800  WBC 11.8* 12.0* 6.2  HGB 8.9* 10.2* 9.4*  HCT 28.1* 30.7* 28.4*  PLT 150 144* 168   BMET  Recent Labs  01/05/16 0400 01/06/16 0406  NA 131* 130*  K 4.3 4.0  CL 98* 98*  CO2 27 26  GLUCOSE 125* 138*  BUN 8 6  CREATININE 0.69 0.55*  CALCIUM 8.0* 8.1*   Cardiac Panel (last 3 results) No results for input(s): CKTOTAL, CKMB, TROPONINI, RELINDX in the last 72 hours.  Studies/Results: 2D echo pending   Assessment/Plan  Active Problems:   HYPERTENSION, BENIGN   CAD, NATIVE VESSEL   Cardiomyopathy, ischemic   Abdominal aortic  aneurysm (AAA) without rupture (HCC)   S/P AAA (abdominal aortic aneurysm) repair   Tobacco use disorder   Acute respiratory failure with hypoxia (HCC)   Systolic congestive heart failure (HCC)   Atrial fibrillation with rapid ventricular response ([place])  1. New onset atrial fibrillation with RVR post op from vascular surgery: He has a history of LV dysfunction with EF 35-40% by echo in 2016 so will avoid Cardizem. IV Amio started yesterday. He remains in afib but rate is controlled. Continue IV amio and convert to PO after load. Continue IV heparin for a/c. TSH WNL. K and Mg have also been stable.   2. ASCAD: with history of PCI of LCx. Continue ASA/statin/BB.   3. Mild to moderate LV dysfunction:  EF 35-40% by echo a year ago. F/u  echo pending. Continue ACE I and BB. Volume appears stable. Monitor.   4. PVD: per vascular POD #4 from EVAR conversion to ABF for AAA and bilateral CIA aneurysm  5. Dyslipidemia: on statin therapy.   LOS: 4 days    Brittainy M. Ladoris Gene 01/07/2016 11:22 AM  The patient has been seen in conjunction with Lyda Jester, PAC. All aspects of care have been considered and discussed. The patient has been personally interviewed, examined, and all clinical data has been reviewed.   A. fib with controlled rate.  Continue IV amiodarone hoping for conversion within the next 24-48 hours.  Will need oral anticoagulation therapy on discharge.  Needs repeat echocardiogram

## 2016-01-07 NOTE — Progress Notes (Addendum)
Progress Note    01/07/2016 7:22 AM 4 Days Post-Op  Subjective:  Feeling a little bit better  Tm 99.9 now 99.1 HR  70's-140's Afib 48'N-462'V systolic 03% [id number]  Filed Vitals:   01/07/16 0252 01/07/16 0302  BP:  103/62  Pulse:  76  Temp: 99.1 F (37.3 C)   Resp:  12    Physical Exam: Cardiac:  irregular Lungs:  Non labored Incisions:  Midline with staples in tact; bilateral groins are soft without hematoma; incisions look good Extremities:  +palpable DP pulses bilaterally Abdomen:  Soft, NT/ND +flatus; +BS; -BM  CBC    Component Value Date/Time   WBC 12.0* 01/06/2016 0406   RBC 3.55* 01/06/2016 0406   HGB 10.2* 01/06/2016 0406   HCT 30.7* 01/06/2016 0406   PLT 144* 01/06/2016 0406   MCV 86.5 01/06/2016 0406   MCH 28.7 01/06/2016 0406   MCHC 33.2 01/06/2016 0406   RDW 14.6 01/06/2016 0406   LYMPHSABS 0.7 10/14/2014 1727   MONOABS 0.4 10/14/2014 1727   EOSABS 0.0 10/14/2014 1727   BASOSABS 0.1 10/14/2014 1727    BMET    Component Value Date/Time   NA 130* 01/06/2016 0406   K 4.0 01/06/2016 0406   CL 98* 01/06/2016 0406   CO2 26 01/06/2016 0406   GLUCOSE 138* 01/06/2016 0406   BUN 6 01/06/2016 0406   CREATININE 0.55* 01/06/2016 0406   CREATININE 0.8 10/08/2015 1500   CALCIUM 8.1* 01/06/2016 0406   GFRNONAA >60 01/06/2016 0406   GFRAA >60 01/06/2016 0406    INR    Component Value Date/Time   INR 1.33 01/03/2016 2025     Intake/Output Summary (Last 24 hours) at 01/07/16 0722 Last data filed at 01/07/16 0600  Gross per 24 hour  Intake 2528.69 ml  Output   2250 ml  Net 278.69 ml     Assessment:  70 y.o. male is s/p:  1. Placement iliac branch device (23 mm x 12 mm x 10 cm) 2. Placement of bifurcated endograft (C3 31 mm x 14 mm x 13 cm) 3. Angioplasty of right external iliac artery (8 mm x 40 mm) 4. Placement right iliac limb extension (12 mm x 10 cm) 5. Explantation of endograft, iliac branch device, and and right iliac  limb 6. Aortobifemoral bypass (16 mm x 8 mm) 7. Placement of catheter in aorta x 2 8. Aortogram Radiologic S&I  4 Days Post-Op  Plan: -pt doing well this morning with palpable DP pulses bilaterally -Afib yesterday - rate better controlled this morning. -DVT prophylaxis:  Heparin gtt -slight increase in WBC over the past couple of days-continue to monitor as pt does have low grade fever.  Continue IS and mobilization as tolerated. -pt states he did eat some yesterday, will try clear liquid diet this morning and if he tolerates, will advance to regular diet -he is having flatus but no BM-will give dulcolax supp this morning. -mobilize as tolerated and continue IS -discussed weaning PCA-pt is okay with this.  Discussed with RN as well -appreciate cardiology's assistance   [name], PA-C [facility] [phone] 01/07/2016 7:22 AM   Addendum  I have independently interviewed and examined the patient, and I agree with the physician assistant's findings.  D/C PCA and shift to PO.  Ok to advance diet.  In and out of afib on Amio and Heparin drip.  Appreciate cardio input.  Awaiting conversion back into NSR.  Adele Barthel, MD [facility] Office:  [phone] Pager: [phone]  01/07/2016, 7:58 AM

## 2016-01-08 ENCOUNTER — Inpatient Hospital Stay (HOSPITAL_COMMUNITY): Payer: Medicare Other

## 2016-01-08 DIAGNOSIS — I4891 Unspecified atrial fibrillation: Secondary | ICD-10-CM

## 2016-01-08 LAB — HEPARIN LEVEL (UNFRACTIONATED)
HEPARIN UNFRACTIONATED: 0.37 [IU]/mL (ref 0.30–0.70)
Heparin Unfractionated: 0.38 IU/mL (ref 0.30–0.70)

## 2016-01-08 LAB — CBC
HEMATOCRIT: 26.8 % — AB (ref 39.0–52.0)
Hemoglobin: 8.9 g/dL — ABNORMAL LOW (ref 13.0–17.0)
MCH: 28 pg (ref 26.0–34.0)
MCHC: 33.2 g/dL (ref 30.0–36.0)
MCV: 84.3 fL (ref 78.0–100.0)
Platelets: 196 10*3/uL (ref 150–400)
RBC: 3.18 MIL/uL — ABNORMAL LOW (ref 4.22–5.81)
RDW: 14.2 % (ref 11.5–15.5)
WBC: 6.4 10*3/uL (ref 4.0–10.5)

## 2016-01-08 LAB — BASIC METABOLIC PANEL
ANION GAP: 8 (ref 5–15)
BUN: 7 mg/dL (ref 6–20)
CALCIUM: 7.8 mg/dL — AB (ref 8.9–10.3)
CO2: 28 mmol/L (ref 22–32)
Chloride: 97 mmol/L — ABNORMAL LOW (ref 101–111)
Creatinine, Ser: 0.61 mg/dL (ref 0.61–1.24)
GFR calc Af Amer: >60 mL/min (ref >60)
GFR calc non Af Amer: >60 mL/min (ref >60)
GLUCOSE: 107 mg/dL — AB (ref 65–99)
POTASSIUM: 3.5 mmol/L (ref 3.5–5.1)
Sodium: 133 mmol/L — ABNORMAL LOW (ref 135–145)

## 2016-01-08 LAB — ECHOCARDIOGRAM COMPLETE
Height: 66 in
WEIGHTICAEL: 2211.65 [oz_av]

## 2016-01-08 LAB — GLUCOSE, CAPILLARY
GLUCOSE-CAPILLARY: 108 mg/dL — AB (ref 65–99)
GLUCOSE-CAPILLARY: 157 mg/dL — AB (ref 65–99)
GLUCOSE-CAPILLARY: 118 mg/dL — AB (ref 65–99)
GLUCOSE-CAPILLARY: 120 mg/dL — AB (ref 65–99)

## 2016-01-08 LAB — PHOSPHORUS: PHOSPHORUS: 2.7 mg/dL (ref 2.5–4.6)

## 2016-01-08 MED ORDER — WARFARIN SODIUM 5 MG PO TABS
7.5000 mg | ORAL_TABLET | Freq: Once | ORAL | Status: AC
  Administered 2016-01-08: 7.5 mg via ORAL
  Filled 2016-01-08: qty 2

## 2016-01-08 MED ORDER — WARFARIN - PHARMACIST DOSING INPATIENT: Freq: Every day | Status: DC

## 2016-01-08 MED ORDER — MORPHINE SULFATE (PF) 2 MG/ML IV SOLN
1.0000 mg | INTRAVENOUS | Status: DC | PRN
  Administered 2016-01-08: 1 mg via INTRAVENOUS
  Filled 2016-01-08: qty 1

## 2016-01-08 MED ORDER — AMIODARONE HCL 200 MG PO TABS
400.0000 mg | ORAL_TABLET | Freq: Two times a day (BID) | ORAL | Status: DC
  Administered 2016-01-08 – 2016-01-10 (×5): 400 mg via ORAL
  Filled 2016-01-08 (×5): qty 2

## 2016-01-08 MED ORDER — COUMADIN BOOK
Freq: Once | Status: DC
  Filled 2016-01-08: qty 1

## 2016-01-08 MED ORDER — CHLORHEXIDINE GLUCONATE 0.12 % MT SOLN
OROMUCOSAL | Status: AC
  Filled 2016-01-08: qty 15

## 2016-01-08 NOTE — Progress Notes (Signed)
Pt ambulated on room air oxygen saturations 80% after ambulated, pt placed back on 2 liters will continue to monitor.

## 2016-01-08 NOTE — Progress Notes (Addendum)
  Progress Note    01/08/2016 7:36 AM 5 Days Post-Op  Subjective:  No complaints-feels he is getting better  Tm 99.6 now afebrile HR 60's-80's NSR/AFib 19'F-790'W systolic 40% 9BD5HG  Gtts: Heparin Amiodarone  Filed Vitals:   01/08/16 0500 01/08/16 0600  BP: 108/65 124/70  Pulse: 75 88  Temp:    Resp: 16 17    Physical Exam: Cardiac:  regular Lungs:  Non labored Incisions:  Healing nicely (midline with staples in tact and bilateral groins look good without hemaotma) Extremities:  +palpalbe DP pulses bilaterally  Abdomen:  Soft, NT/ND; +flatus; +BM  CBC    Component Value Date/Time   WBC 6.4 01/08/2016 0338   RBC 3.18* 01/08/2016 0338   HGB 8.9* 01/08/2016 0338   HCT 26.8* 01/08/2016 0338   PLT 196 01/08/2016 0338   MCV 84.3 01/08/2016 0338   MCH 28.0 01/08/2016 0338   MCHC 33.2 01/08/2016 0338   RDW 14.2 01/08/2016 0338   LYMPHSABS 0.7 10/14/2014 1727   MONOABS 0.4 10/14/2014 1727   EOSABS 0.0 10/14/2014 1727   BASOSABS 0.1 10/14/2014 1727    BMET    Component Value Date/Time   NA 133* 01/08/2016 0338   K 3.5 01/08/2016 0338   CL 97* 01/08/2016 0338   CO2 28 01/08/2016 0338   GLUCOSE 107* 01/08/2016 0338   BUN 7 01/08/2016 0338   CREATININE 0.61 01/08/2016 0338   CREATININE 0.8 10/08/2015 1500   CALCIUM 7.8* 01/08/2016 0338   GFRNONAA >60 01/08/2016 0338   GFRAA >60 01/08/2016 0338    INR    Component Value Date/Time   INR 1.33 01/03/2016 2025     Intake/Output Summary (Last 24 hours) at 01/08/16 0736 Last data filed at 01/08/16 0600  Gross per 24 hour  Intake 1356.22 ml  Output   1725 ml  Net -368.78 ml     Assessment:  70 y.o. male is s/p:  1. Placement iliac branch device (23 mm x 12 mm x 10 cm) 2. Placement of bifurcated endograft (C3 31 mm x 14 mm x 13 cm) 3. Angioplasty of right external iliac artery (8 mm x 40 mm) 4. Placement right iliac limb extension (12 mm x 10 cm) 5. Explantation of endograft, iliac branch device, and  and right iliac limb 6. Aortobifemoral bypass (16 mm x 8 mm) 7. Placement of catheter in aorta x 2 8. Aortogram Radiologic S&I   5 Days Post-Op  Plan: -pt did ambulate in the unit, however, his O2 sats dropped to 80%-most likely will need home O2.   -DVT prophylaxis:  Heparin gtt;   -Afib-convert amiodarone per cardiology -continue IS every hour-says he is getting up phlegm when he uses it. -continue to ambulate -dc PCA and start po pain medication -tolerating diet-+fatus; +BM withsuppository -d/c PCA and start po pain medication   [name], PA-C [facility] [phone] 01/08/2016 7:36 AM    Addendum  I have independently interviewed and examined the patient, and I agree with the physician assistant's findings.  Tolerating diet.  Converted to Amio.  Start coumadin.  PT/OT/ABI.  Adele Barthel, MD [facility] [place] Office: [phone] Pager: [phone]  01/08/2016, 9:56 AM

## 2016-01-08 NOTE — Progress Notes (Addendum)
ANTICOAGULATION CONSULT NOTE - Follow Up Consult  Pharmacy Consult:  Heparin / Coumadin Indication: atrial fibrillation  Allergies  Allergen Reactions  . Aspirin Nausea Only and Other (See Comments)    Stomach upset  . Ibuprofen Hives    Patient Measurements: Height: '5\' 6"'$  (167.6 cm) Weight: 138 lb 3.7 oz (62.7 kg) IBW/kg (Calculated) : 63.8  Heparin dosing weight = 63 kg  Vital Signs: Temp: 98.5 F (36.9 C) (05/02 0700) Temp Source: Oral (05/02 0700) BP: 118/80 mmHg (05/02 0900) Pulse Rate: 102 (05/02 0900)  Labs:  Recent Labs  01/06/16 0406  01/07/16 0800 01/07/16 1823 01/08/16 0338  HGB 10.2*  --  9.4*  --  8.9*  HCT 30.7*  --  28.4*  --  26.8*  PLT 144*  --  168  --  196  HEPARINUNFRC  --   < > <0.10* <0.10* 0.38  CREATININE 0.55*  --   --   --  0.61  < > = values in this interval not displayed.  Estimated Creatinine Clearance: 78.4 mL/min (by C-G formula based on Cr of 0.61).    Assessment: 7 YOM to continue on IV heparin and add Coumadin for new-onset Afib post vascular surgery on 01/04/16.  Heparin level is therapeutic and INR is low as expected.  No bleeding reported.   Goal of Therapy:  Heparin level 0.3-0.7 units/ml  INR 2 - 3 Monitor platelets by anticoagulation protocol: Yes    Plan:  - Continue heparin gtt at 1500 units/hr - Check confirmatory HL - Coumadin 7.'5mg'$  PO today - Daily HL / CBC / PT / INR - Coumadin book - F/U resuming home meds: ASA, Lexapro, Remeron, tizanidine    Jerline Linzy D. Mina Marble, PharmD, BCPS Pager:  [phone] 01/08/2016, 10:55 AM    =====================   Addendum: - confirmatory heparin level therapeutic; no change to therapy   Anny Sayler D. Mina Marble, PharmD, BCPS Pager:  [phone] 01/08/2016, 1:51 PM

## 2016-01-08 NOTE — Progress Notes (Signed)
       Patient Name: [name] Date of Encounter: 01/08/2016    SUBJECTIVE: Asymptomatic from a CV standpoint.  TELEMETRY:  Normal sinus rhythm with frequent PACs Filed Vitals:   01/08/16 0300 01/08/16 0400 01/08/16 0500 01/08/16 0600  BP: 115/64 117/63 108/65 124/70  Pulse: 77 78 75 88  Temp: 98.7 F (37.1 C)     TempSrc: Oral     Resp: '14 10 16 17  '$ Height:      Weight:      SpO2: 93% 92% 93% 98%    Intake/Output Summary (Last 24 hours) at 01/08/16 0849 Last data filed at 01/08/16 0600  Gross per 24 hour  Intake 1279.52 ml  Output   1725 ml  Net -445.48 ml   LABS: Basic Metabolic Panel:  Recent Labs  01/06/16 0406 01/07/16 0800 01/08/16 0338  NA 130*  --  133*  K 4.0  --  3.5  CL 98*  --  97*  CO2 26  --  28  GLUCOSE 138*  --  107*  BUN 6  --  7  CREATININE 0.55*  --  0.61  CALCIUM 8.1*  --  7.8*  PHOS 1.9* 3.2 2.7   CBC:  Recent Labs  01/07/16 0800 01/08/16 0338  WBC 6.2 6.4  HGB 9.4* 8.9*  HCT 28.4* 26.8*  MCV 86.6 84.3  PLT 168 196   Cardiac Enzymes: No results for input(s): CKTOTAL, CKMB, CKMBINDEX, TROPONINI in the last 72 hours. BNP: Invalid input(s): POCBNP Hemoglobin A1C: No results for input(s): HGBA1C in the last 72 hours. Fasting Lipid Panel: No results for input(s): CHOL, HDL, LDLCALC, TRIG, CHOLHDL, LDLDIRECT in the last 72 hours.  Radiology/Studies:  No new data  Physical Exam: Blood pressure 124/70, pulse 88, temperature 98.7 F (37.1 C), temperature source Oral, resp. rate 17, height '5\' 6"'$  (1.676 m), weight 138 lb 3.7 oz (62.7 kg), SpO2 98 %. Weight change:   Wt Readings from Last 3 Encounters:  01/06/16 138 lb 3.7 oz (62.7 kg)  11/22/15 130 lb (58.968 kg)  11/02/15 128 lb (58.06 kg)    Irregular heart rhythm Chest is clear Neurological exam is intact  ASSESSMENT:  1. Paroxysmal atrial fibrillation with high embolic risk, ChadsVasc of 4. 2. Abdominal aortic aneurysm status post repair 3. History of  coronary artery disease and ischemic cardiomyopathy 4. Chronic systolic heart failure without evidence of volume overload   Plan:  1. Convert amiodarone to oral dosing 2. When safe IV heparin can be converted to either Coumadin or a NOAC, depending upon the preference of the surgical team.  Signed, Sinclair Grooms 01/08/2016, 8:49 AM

## 2016-01-08 NOTE — Progress Notes (Signed)
  Echocardiogram 2D Echocardiogram has been performed.  [name] 01/08/2016, 10:34 AM

## 2016-01-09 DIAGNOSIS — F17200 Nicotine dependence, unspecified, uncomplicated: Secondary | ICD-10-CM

## 2016-01-09 LAB — GLUCOSE, CAPILLARY
GLUCOSE-CAPILLARY: 125 mg/dL — AB (ref 65–99)
Glucose-Capillary: 111 mg/dL — ABNORMAL HIGH (ref 65–99)
Glucose-Capillary: 105 mg/dL — ABNORMAL HIGH (ref 65–99)
Glucose-Capillary: 106 mg/dL — ABNORMAL HIGH (ref 65–99)

## 2016-01-09 LAB — CBC
HCT: 26.4 % — ABNORMAL LOW (ref 39.0–52.0)
Hemoglobin: 8.8 g/dL — ABNORMAL LOW (ref 13.0–17.0)
MCH: 29.1 pg (ref 26.0–34.0)
MCHC: 33.3 g/dL (ref 30.0–36.0)
MCV: 87.4 fL (ref 78.0–100.0)
PLATELETS: 219 10*3/uL (ref 150–400)
RBC: 3.02 MIL/uL — ABNORMAL LOW (ref 4.22–5.81)
RDW: 14.9 % (ref 11.5–15.5)
WBC: 6.8 10*3/uL (ref 4.0–10.5)

## 2016-01-09 LAB — PROTIME-INR
INR: 1.52 — ABNORMAL HIGH (ref 0.00–1.49)
PROTHROMBIN TIME: 18.4 s — AB (ref 11.6–15.2)

## 2016-01-09 LAB — HEPARIN LEVEL (UNFRACTIONATED): HEPARIN UNFRACTIONATED: 0.4 [IU]/mL (ref 0.30–0.70)

## 2016-01-09 LAB — PHOSPHORUS: Phosphorus: 3 mg/dL (ref 2.5–4.6)

## 2016-01-09 MED ORDER — IPRATROPIUM-ALBUTEROL 0.5-2.5 (3) MG/3ML IN SOLN
3.0000 mL | Freq: Two times a day (BID) | RESPIRATORY_TRACT | Status: DC
  Administered 2016-01-09 – 2016-01-11 (×4): 3 mL via RESPIRATORY_TRACT
  Filled 2016-01-09 (×4): qty 3

## 2016-01-09 MED ORDER — WARFARIN SODIUM 5 MG PO TABS
5.0000 mg | ORAL_TABLET | Freq: Once | ORAL | Status: AC
  Administered 2016-01-09: 5 mg via ORAL
  Filled 2016-01-09: qty 1

## 2016-01-09 NOTE — Progress Notes (Addendum)
Progress Note    01/09/2016 7:40 AM 6 Days Post-Op  Subjective:  Says pain is controlled.  Afebrile HR 70's-80's NSR 25'Z-563'O systolic   Filed Vitals:   01/09/16 0000 01/09/16 0443  BP: 124/82   Pulse: 72   Temp:  98.3 F (36.8 C)  Resp: 17     Physical Exam: Cardiac:  regular Lungs:  CTAB with some decreased BS at bases Incisions:  Midline incision is clean with staples in tact; bilateral groins are clean and dry and healing nicely Extremities:  Easily palpable bilateral DP pulses Abdomen:  Soft, NT/ND  CBC    Component Value Date/Time   WBC 6.8 01/09/2016 0400   RBC 3.02* 01/09/2016 0400   HGB 8.8* 01/09/2016 0400   HCT 26.4* 01/09/2016 0400   PLT 219 01/09/2016 0400   MCV 87.4 01/09/2016 0400   MCH 29.1 01/09/2016 0400   MCHC 33.3 01/09/2016 0400   RDW 14.9 01/09/2016 0400   LYMPHSABS 0.7 10/14/2014 1727   MONOABS 0.4 10/14/2014 1727   EOSABS 0.0 10/14/2014 1727   BASOSABS 0.1 10/14/2014 1727    BMET    Component Value Date/Time   NA 133* 01/08/2016 0338   K 3.5 01/08/2016 0338   CL 97* 01/08/2016 0338   CO2 28 01/08/2016 0338   GLUCOSE 107* 01/08/2016 0338   BUN 7 01/08/2016 0338   CREATININE 0.61 01/08/2016 0338   CREATININE 0.8 10/08/2015 1500   CALCIUM 7.8* 01/08/2016 0338   GFRNONAA >60 01/08/2016 0338   GFRAA >60 01/08/2016 0338    INR    Component Value Date/Time   INR 1.52* 01/09/2016 0400     Intake/Output Summary (Last 24 hours) at 01/09/16 0740 Last data filed at 01/09/16 0445  Gross per 24 hour  Intake    360 ml  Output   1700 ml  Net  -1340 ml   ECHO 01/08/16:' ------------------------------------------------------------------- Study Conclusions  - Left ventricle: The cavity size was normal. There was moderate  concentric hypertrophy. Systolic function was moderately reduced.  The estimated ejection fraction was in the range of 35% to 40%.  Diffuse hypokinesis. Hypokinesis is worse in the inferior and   inferolateral myocardium. Doppler parameters are consistent with  inderminate left ventricular relaxation (grade 1 diastolic  dysfunction). - Aortic valve: Transvalvular velocity was within the normal range.  There was no stenosis. There was no regurgitation. - Mitral valve: Transvalvular velocity was within the normal range.  There was no evidence for stenosis. There was trivial  regurgitation. - Left atrium: The atrium was mildly dilated. - Right ventricle: The cavity size was normal. Wall thickness was  normal. Systolic function was normal. - Tricuspid valve: There was trivial regurgitation.   Assessment:  70 y.o. male is s/p:  1. Placement iliac branch device (23 mm x 12 mm x 10 cm) 2. Placement of bifurcated endograft (C3 31 mm x 14 mm x 13 cm) 3. Angioplasty of right external iliac artery (8 mm x 40 mm) 4. Placement right iliac limb extension (12 mm x 10 cm) 5. Explantation of endograft, iliac branch device, and and right iliac limb 6. Aortobifemoral bypass (16 mm x 8 mm) 7. Placement of catheter in aorta x 2 Aortogram  6 Days Post-Op  Plan: -pt is doing well this morning.  His pain is well controlled.  He has easily palpable DP pulses bilaterally -tolerating diet -DVT prophylaxis:  Heparin bridge to coumadin; INR 1.5 today. -doing well without PCA and only received one dose of IV  pain medication -most likely transfer to [place], [place] [facility] [phone] 01/09/2016 7:40 AM    Addendum  I have independently interviewed and examined the patient, and I agree with the physician assistant's findings.  Ok to transfer to floor.  Coumadin loading.  Adele Barthel, MD [facility] [place] Office: [phone] Pager: [phone]  01/09/2016, 8:30 AM

## 2016-01-09 NOTE — Care Management Note (Signed)
Case Management Note  Patient Details  Name: [name] MRN: [id number] Date of Birth: 1946/06/13  Subjective/Objective:      Patient is s/pe endovascular aneurysm repair for AAA. Post operatively complicated by afib with rvr, conts on amiodarone and heparin drip, will d/c pca and transition to po's. Advance diet . Per pt /ot eval rec SNF , patient is refusing snf, stating he will go home with son who works, while son is working his brother [name] will be with him. NCM spoke with patient about setting up [facility] services for him when he gets home if he does not want snf patient states he does not want [place] services. NCM will check back with patient tomorrow to see if he has changed his mind.   5/2 1433- NCM spoke with patient, he states he still does not want [place] services. NCM will cont to follow for dc needs, he may need home oxygen at dc.               Action/Plan:   Expected Discharge Date:  01/12/16               Expected Discharge Plan:  Skilled Nursing Facility  In-House Referral:  Clinical Social Work  Discharge planning Services  CM Consult  Post Acute Care Choice:    Choice offered to:  Patient  DME Arranged:    DME Agency:     HH Arranged:  Patient Refused [place] Agency:     Status of Service:  In process, will continue to follow  Medicare Important Message Given:  Yes Date Medicare IM Given:    Medicare IM give by:    Date Additional Medicare IM Given:    Additional Medicare Important Message give by:     If discussed at [place] of Stay Meetings, dates discussed:    Additional Comments:  Zenon Mayo, RN 01/09/2016, 12:43 PM

## 2016-01-09 NOTE — Progress Notes (Signed)
       Patient Name: [name] Date of Encounter: 01/09/2016    SUBJECTIVE: The patient is looking for to discharge. He denies dyspnea and chest pain.  TELEMETRY:  Normal sinus rhythm with PACs.Danley Danker Vitals:   01/09/16 0813 01/09/16 0805 01/09/16 0956 01/09/16 1055  BP:  129/77    Pulse:  77    Temp: 98.3 F (36.8 C) 99.3 F (37.4 C)  98 F (36.7 C)  TempSrc: Oral Oral  Oral  Resp:  20    Height:      Weight:      SpO2:  98% 85%     Intake/Output Summary (Last 24 hours) at 01/09/16 1215 Last data filed at 01/09/16 0800  Gross per 24 hour  Intake    240 ml  Output   1100 ml  Net   -860 ml   LABS: Basic Metabolic Panel:  Recent Labs  01/08/16 0338 01/09/16 0400  NA 133*  --   K 3.5  --   CL 97*  --   CO2 28  --   GLUCOSE 107*  --   BUN 7  --   CREATININE 0.61  --   CALCIUM 7.8*  --   PHOS 2.7 3.0   CBC:  Recent Labs  01/08/16 0338 01/09/16 0400  WBC 6.4 6.8  HGB 8.9* 8.8*  HCT 26.8* 26.4*  MCV 84.3 87.4  PLT 196 219   Radiology/Studies:  No new data  Physical Exam: Blood pressure 129/77, pulse 77, temperature 98 F (36.7 C), temperature source Oral, resp. rate 20, height '5\' 6"'$  (1.676 m), weight 138 lb 3.7 oz (62.7 kg), SpO2 85 %. Weight change:   Wt Readings from Last 3 Encounters:  01/06/16 138 lb 3.7 oz (62.7 kg)  11/22/15 130 lb (58.968 kg)  11/02/15 128 lb (58.06 kg)    Chest is clear Cardiac exam does not reveal a pericardial friction rub or gallop Abdomen is nontender. Fresh incision without bleeding or evidence of wound infection.  ASSESSMENT:  1. Paroxysmal atrial fibrillation, current rhythm is normal sinus rhythm on amiodarone now converted to oral therapy. 2. Anticoagulation with warfarin has been started.  Plan:  We will follow. Monitor for recurrent atrial fib Long-term anticoagulation therapy given high [place] VASC score  Signed, Sinclair Grooms 01/09/2016, 12:15 PM

## 2016-01-09 NOTE — Progress Notes (Signed)
Patient received from 3S. Family and belongings at bedside. No needs at this time.

## 2016-01-09 NOTE — Progress Notes (Addendum)
PT Cancellation Note  Patient Details Name: [name] MRN: [id number] DOB: 12-02-1945   Cancelled Treatment:    Reason Eval/Treat Not Completed: Other (comment) ("Im not walking right now.") Pt refused.  Will return as able.  Thanks.    Irwin Brakeman F 01/09/2016, 10:59 AM [facility] [phone] (pager)

## 2016-01-09 NOTE — Progress Notes (Signed)
ANTICOAGULATION CONSULT NOTE - Follow Up Consult  Pharmacy Consult:  Heparin / Coumadin Indication: atrial fibrillation  Allergies  Allergen Reactions  . Aspirin Nausea Only and Other (See Comments)    Stomach upset  . Ibuprofen Hives    Patient Measurements: Height: '5\' 6"'$  (167.6 cm) Weight: 138 lb 3.7 oz (62.7 kg) IBW/kg (Calculated) : 63.8  Heparin dosing weight = 63 kg  Vital Signs: Temp: 98.3 F (36.8 C) (05/03 0443) Temp Source: Oral (05/03 0443) BP: 129/77 mmHg (05/03 0805) Pulse Rate: 77 (05/03 0805)  Labs:  Recent Labs  01/07/16 0800  01/08/16 0338 01/08/16 1322 01/09/16 0400  HGB 9.4*  --  8.9*  --  8.8*  HCT 28.4*  --  26.8*  --  26.4*  PLT 168  --  196  --  219  LABPROT  --   --   --   --  18.4*  INR  --   --   --   --  1.52*  HEPARINUNFRC <0.10*  < > 0.38 0.37 0.40  CREATININE  --   --  0.61  --   --   < > = values in this interval not displayed.  Estimated Creatinine Clearance: 78.4 mL/min (by C-G formula based on Cr of 0.61).  Assessment: 60 YOM to continue on Heparin / coumadin bridge for new onset Afib (CHADsVASc = 4), s/p vascular surgery 4/28. Last HL is therapeutic at 0.4. Started Coumadin on 5/2. INR was 1.33 on 4/27. INR up to 1.52 after first dose. Hgb stable at 8.8, plts wnl. No s/s of bleed  Goal of Therapy:  Heparin level 0.3-0.7 units/ml  INR 2 - 3 Monitor platelets by anticoagulation protocol: Yes   Plan:  Continue heparin gtt at 1500 units/hr Coumadin '5mg'$  PO today Monitor daily INR / HL, CBC, s/s of bleed  Elenor Quinones, PharmD, [facility] Clinical Pharmacist Pager [phone] 01/09/2016 8:18 AM

## 2016-01-10 LAB — PHOSPHORUS: Phosphorus: 3.5 mg/dL (ref 2.5–4.6)

## 2016-01-10 LAB — GLUCOSE, CAPILLARY
GLUCOSE-CAPILLARY: 114 mg/dL — AB (ref 65–99)
GLUCOSE-CAPILLARY: 140 mg/dL — AB (ref 65–99)
Glucose-Capillary: 112 mg/dL — ABNORMAL HIGH (ref 65–99)
Glucose-Capillary: 111 mg/dL — ABNORMAL HIGH (ref 65–99)

## 2016-01-10 LAB — PROTIME-INR
INR: 3.18 — AB (ref 0.00–1.49)
PROTHROMBIN TIME: 32 s — AB (ref 11.6–15.2)

## 2016-01-10 LAB — HEPARIN LEVEL (UNFRACTIONATED): Heparin Unfractionated: 0.28 IU/mL — ABNORMAL LOW (ref 0.30–0.70)

## 2016-01-10 MED ORDER — AMIODARONE HCL 200 MG PO TABS
200.0000 mg | ORAL_TABLET | Freq: Two times a day (BID) | ORAL | Status: DC
  Administered 2016-01-10 – 2016-01-11 (×2): 200 mg via ORAL
  Filled 2016-01-10 (×2): qty 1

## 2016-01-10 NOTE — Progress Notes (Signed)
ANTICOAGULATION CONSULT NOTE - Follow Up Consult  Pharmacy Consult:  Heparin / Coumadin Indication: atrial fibrillation  Allergies  Allergen Reactions  . Aspirin Nausea Only and Other (See Comments)    Stomach upset  . Ibuprofen Hives    Patient Measurements: Height: '5\' 6"'$  (167.6 cm) Weight: 138 lb 3.7 oz (62.7 kg) IBW/kg (Calculated) : 63.8  Heparin dosing weight = 63 kg  Vital Signs: Temp: 98.3 F (36.8 C) (05/04 0429) Temp Source: Oral (05/04 0429) BP: 120/56 mmHg (05/04 0958) Pulse Rate: 68 (05/04 0958)  Labs:  Recent Labs  01/08/16 0338 01/08/16 1322 01/09/16 0400 01/10/16 0605  HGB 8.9*  --  8.8*  --   HCT 26.8*  --  26.4*  --   PLT 196  --  219  --   LABPROT  --   --  18.4* 32.0*  INR  --   --  1.52* 3.18*  HEPARINUNFRC 0.38 0.37 0.40 0.28*  CREATININE 0.61  --   --   --     Estimated Creatinine Clearance: 78.4 mL/min (by C-G formula based on Cr of 0.61).  Assessment: 61 YOM to continue on Heparin / coumadin bridge for new onset Afib (CHADsVASc = 4), s/p vascular surgery 4/28. Last HL is therapeutic at 0.4. Started Coumadin on 5/2. INR was 1.33 on 4/27. INR up to 1.52 after first dose now up to 3.1 this am after two doses  Hgb stable at 8.8, plts wnl. No s/s of bleed  Heparin now off with elevated INR, long discussion about coumadin with patient this morning. He seemed to have a fairly good understanding, expressed the importance of close followup with his large bump in INR overnight.  Would recommend holding warfarin today and agree with checking INR tomorrow. Consider warfarin '1mg'$  daily and titrate from there.  Goal of Therapy:  Heparin level 0.3-0.7 units/ml  INR 2 - 3 Monitor platelets by anticoagulation protocol: Yes   Plan:  Stop heparin this am with INR 3.1 Hold warfarin today 5/4 INR check in am 5/5  Erin Hearing PharmD., BCPS Clinical Pharmacist Pager [phone] 01/10/2016 11:13 AM

## 2016-01-10 NOTE — Progress Notes (Signed)
       Patient Name: [name] Date of Encounter: 01/10/2016    SUBJECTIVE: No cardiac complaints  TELEMETRY:  Normal sinus rhythm with PACs: Filed Vitals:   01/09/16 2100 01/10/16 0429 01/10/16 0941 01/10/16 0958  BP:  129/69  120/56  Pulse:  65  68  Temp:  98.3 F (36.8 C)    TempSrc:  Oral    Resp:  16    Height:      Weight:      SpO2: 88% 100% 98%     Intake/Output Summary (Last 24 hours) at 01/10/16 1111 Last data filed at 01/10/16 0900  Gross per 24 hour  Intake    360 ml  Output   1400 ml  Net  -1040 ml   LABS: Basic Metabolic Panel:  Recent Labs  01/08/16 0338 01/09/16 0400 01/10/16 0605  NA 133*  --   --   K 3.5  --   --   CL 97*  --   --   CO2 28  --   --   GLUCOSE 107*  --   --   BUN 7  --   --   CREATININE 0.61  --   --   CALCIUM 7.8*  --   --   PHOS 2.7 3.0 3.5   CBC:  Recent Labs  01/08/16 0338 01/09/16 0400  WBC 6.4 6.8  HGB 8.9* 8.8*  HCT 26.8* 26.4*  MCV 84.3 87.4  PLT 196 219   Cardiac Enzymes: No results for input(s): CKTOTAL, CKMB, CKMBINDEX, TROPONINI in the last 72 hours. BNP: Invalid input(s): POCBNP Hemoglobin A1C: No results for input(s): HGBA1C in the last 72 hours. Fasting Lipid Panel: No results for input(s): CHOL, HDL, LDLCALC, TRIG, CHOLHDL, LDLDIRECT in the last 72 hours.  Radiology/Studies:  No new data  Physical Exam: Blood pressure 120/56, pulse 68, temperature 98.3 F (36.8 C), temperature source Oral, resp. rate 16, height '5\' 6"'$  (1.676 m), weight 138 lb 3.7 oz (62.7 kg), SpO2 98 %. Weight change:   Wt Readings from Last 3 Encounters:  01/06/16 138 lb 3.7 oz (62.7 kg)  11/22/15 130 lb (58.968 kg)  11/02/15 128 lb (58.06 kg)    Decreased breath sounds at both bases Regular rate and rhythm  ASSESSMENT:  1. Paroxysmal atrial fibrillation, with good rhythm control since adding amiodarone. Mild left atrial enlargement noted on echocardiography. 2. 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 Regional wall motion abnormalities are noted. 3. Anticoagulation therapy with Coumadin has been instituted 4. Coronary artery disease without recent angina or ischemic complications. Prior circumflex stent.  Plan:  1. We'll decrease amiodarone to 200 mg twice a day 2. Will need follow-up with his primary cardiologist, Dr. Richardson Landry Klein/Tracey Turner  Signed, Sinclair Grooms 01/10/2016, 11:11 AM

## 2016-01-10 NOTE — Clinical Social Work Note (Signed)
Patient transferred from 3S to 2W. Will provide handoff information to 2W CSW.  This CSW signing off.  Dayton Scrape, [place]

## 2016-01-10 NOTE — Care Management Important Message (Signed)
Important Message  Patient Details  Name: [name] MRN: [id number] Date of Birth: 06-Jun-1946   Medicare Important Message Given:  Yes    [place], Florentine Diekman Abena 01/10/2016, 1:01 PM

## 2016-01-10 NOTE — Progress Notes (Signed)
Utilization review completed.  

## 2016-01-10 NOTE — Discharge Instructions (Signed)
°Information on my medicine - Coumadin®   (Warfarin) ° °This medication education was reviewed with me or my healthcare representative as part of my discharge preparation.  The pharmacist that spoke with me during my hospital stay was:  Allizon Woznick Rhea, RPH ° °Why was Coumadin prescribed for you? °Coumadin was prescribed for you because you have a blood clot or a medical condition that can cause an increased risk of forming blood clots. Blood clots can cause serious health problems by blocking the flow of blood to the heart, lung, or brain. Coumadin can prevent harmful blood clots from forming. °As a reminder your indication for Coumadin is:   Stroke Prevention Because Of Atrial Fibrillation ° °What test will check on my response to Coumadin? °While on Coumadin (warfarin) you will need to have an INR test regularly to ensure that your dose is keeping you in the desired range. The INR (international normalized ratio) number is calculated from the result of the laboratory test called prothrombin time (PT). ° °If an INR APPOINTMENT HAS NOT ALREADY BEEN MADE FOR YOU please schedule an appointment to have this lab work done by your health care provider within 7 days. °Your INR goal is usually a number between:  2 to 3 or your provider may give you a more narrow range like 2-2.5.  Ask your health care provider during an office visit what your goal INR is. ° °What  do you need to  know  About  COUMADIN? °Take Coumadin (warfarin) exactly as prescribed by your healthcare provider about the same time each day.  DO NOT stop taking without talking to the doctor who prescribed the medication.  Stopping without other blood clot prevention medication to take the place of Coumadin may increase your risk of developing a new clot or stroke.  Get refills before you run out. ° °What do you do if you miss a dose? °If you miss a dose, take it as soon as you remember on the same day then continue your regularly scheduled regimen the  next day.  Do not take two doses of Coumadin at the same time. ° °Important Safety Information °A possible side effect of Coumadin (Warfarin) is an increased risk of bleeding. You should call your healthcare provider right away if you experience any of the following: °  Bleeding from an injury or your nose that does not stop. °  Unusual colored urine (red or dark brown) or unusual colored stools (red or black). °  Unusual bruising for unknown reasons. °  A serious fall or if you hit your head (even if there is no bleeding). ° °Some foods or medicines interact with Coumadin® (warfarin) and might alter your response to warfarin. To help avoid this: °  Eat a balanced diet, maintaining a consistent amount of Vitamin K. °  Notify your provider about major diet changes you plan to make. °  Avoid alcohol or limit your intake to 1 drink for women and 2 drinks for men per day. °(1 drink is 5 oz. wine, 12 oz. beer, or 1.5 oz. liquor.) ° °Make sure that ANY health care provider who prescribes medication for you knows that you are taking Coumadin (warfarin).  Also make sure the healthcare provider who is monitoring your Coumadin knows when you have started a new medication including herbals and non-prescription products. ° °Coumadin® (Warfarin)  Major Drug Interactions  °Increased Warfarin Effect Decreased Warfarin Effect  °Alcohol (large quantities) °Antibiotics (esp. Septra/Bactrim, Flagyl, Cipro) °Amiodarone (Cordarone) °Aspirin (  naproxen, etc.) °Piroxicam (Feldene) °Propafenone (Rythmol SR) °Propranolol (Inderal) °Isoniazid (INH) °Posaconazole (Noxafil) Barbiturates (Phenobarbital) °Carbamazepine (Tegretol) °Chlordiazepoxide (Librium) °Cholestyramine (Questran) °Griseofulvin °Oral Contraceptives °Rifampin °Sucralfate (Carafate) °Vitamin K  ° °Coumadin® (Warfarin) Major Herbal Interactions  °Increased Warfarin Effect Decreased Warfarin Effect   °Garlic °Ginseng °Ginkgo biloba Coenzyme Q10 °Green tea °St. John’s wort   ° °Coumadin® (Warfarin) FOOD Interactions  °Eat a consistent number of servings per week of foods HIGH in Vitamin K °(1 serving = ½ cup)  °Collards (cooked, or boiled & drained) °Kale (cooked, or boiled & drained) °Mustard greens (cooked, or boiled & drained) °Parsley *serving size only = ¼ cup °Spinach (cooked, or boiled & drained) °Swiss chard (cooked, or boiled & drained) °Turnip greens (cooked, or boiled & drained)  °Eat a consistent number of servings per week of foods MEDIUM-HIGH in Vitamin K °(1 serving = 1 cup)  °Asparagus (cooked, or boiled & drained) °Broccoli (cooked, boiled & drained, or raw & chopped) °Brussel sprouts (cooked, or boiled & drained) *serving size only = ½ cup °Lettuce, raw (green leaf, endive, romaine) °Spinach, raw °Turnip greens, raw & chopped  ° °These websites have more information on Coumadin (warfarin):  www.coumadin.com; °www.ahrq.gov/consumer/coumadin.htm; ° ° ° °

## 2016-01-10 NOTE — Progress Notes (Addendum)
AAA Progress Note    01/10/2016 7:47 AM 7 Days Post-Op  Subjective:   States he is having a little bit of back pain and gas.  Thinks he needs some pain medicine  "i was counting on going home today-I don't want to be in the hospital if/when my sister dies"   Tm 99.1 now afebrile HR  70's NSR 557'D-220'U systolic 542% 7CW2BJ (62% 2LO2NC last pm at 2000)  Filed Vitals:   01/09/16 1955 01/10/16 0429  BP: 112/59 129/69  Pulse: 76 65  Temp: 98.5 F (36.9 C) 98.3 F (36.8 C)  Resp: 18 16    Physical Exam: Cardiac:  regular Lungs:   Non labored Abdomen:  Soft, NT/ND Incisions:  C/d/i with staples midline.  Bilateral groins healing nicely Extremities:  +palpable DP pulses bilaterally  CBC    Component Value Date/Time   WBC 6.8 01/09/2016 0400   RBC 3.02* 01/09/2016 0400   HGB 8.8* 01/09/2016 0400   HCT 26.4* 01/09/2016 0400   PLT 219 01/09/2016 0400   MCV 87.4 01/09/2016 0400   MCH 29.1 01/09/2016 0400   MCHC 33.3 01/09/2016 0400   RDW 14.9 01/09/2016 0400   LYMPHSABS 0.7 10/14/2014 1727   MONOABS 0.4 10/14/2014 1727   EOSABS 0.0 10/14/2014 1727   BASOSABS 0.1 10/14/2014 1727    BMET    Component Value Date/Time   NA 133* 01/08/2016 0338   K 3.5 01/08/2016 0338   CL 97* 01/08/2016 0338   CO2 28 01/08/2016 0338   GLUCOSE 107* 01/08/2016 0338   BUN 7 01/08/2016 0338   CREATININE 0.61 01/08/2016 0338   CREATININE 0.8 10/08/2015 1500   CALCIUM 7.8* 01/08/2016 0338   GFRNONAA >60 01/08/2016 0338   GFRAA >60 01/08/2016 0338    INR    Component Value Date/Time   INR 3.18* 01/10/2016 0605     Intake/Output Summary (Last 24 hours) at 01/10/16 0747 Last data filed at 01/10/16 0142  Gross per 24 hour  Intake    600 ml  Output   1350 ml  Net   -750 ml     Assessment/Plan:  70 y.o. male is s/p  1. Placement iliac branch device (23 mm x 12 mm x 10 cm) 2. Placement of bifurcated endograft (C3 31 mm x 14 mm x 13 cm) 3. Angioplasty of right external  iliac artery (8 mm x 40 mm) 4. Placement right iliac limb extension (12 mm x 10 cm) 5. Explantation of endograft, iliac branch device, and and right iliac limb 6. Aortobifemoral bypass (16 mm x 8 mm) 7. Placement of catheter in aorta x 2 Aortogram  7 Days Post-Op  -pt doing well this am -still requiring O2 -INR 3.18 this am, which is a significant increase from yesterday at 1.52 with two doses of coumadin (7.'5mg'$  and '5mg'$  respectively).  May need to consider decreasing dose given pt is on amiodarone.  -d/w pt that we will need to check INR tomorrow.  He states his sister is sick and wants to see her before she dies.  Will d/w Dr. Bridgett Larsson about discharge planning. -his PCP is Dr. Almedia Balls in [facility].  -abdomen is soft-last BM yesterday morning.  Still tolerating diet.    [name], PA-C [facility] [phone] 01/10/2016 7:47 AM  Addendum  I have independently interviewed and examined the patient, and I agree with the physician assistant's findings.  Ok with discharge once outpatient arrangements including Coumadin follow up is arranged.  Pt probably  will need to be on some home oxygen, which I expect to wean off with time despite his significant COPD.  Adele Barthel, MD [facility] [place] Office: [phone] Pager: [phone]  01/10/2016, 8:35 AM

## 2016-01-10 NOTE — Progress Notes (Signed)
PT Cancellation Note  Patient Details Name: [name] MRN: [id number] DOB: 1946-05-09   Cancelled Treatment:    Reason Eval/Treat Not Completed: Other (comment)  Pt indicates that he is being D/C this afternoon and requests to rest until D/C.  Will f/u tomorrow if pt does not D/C today.     Ashanta Amoroso, Thornton Papas 01/10/2016, 1:27 PM

## 2016-01-11 ENCOUNTER — Telehealth: Payer: Self-pay | Admitting: Vascular Surgery

## 2016-01-11 LAB — PROTIME-INR
INR: 3.58 — AB (ref 0.00–1.49)
PROTHROMBIN TIME: 35 s — AB (ref 11.6–15.2)

## 2016-01-11 LAB — GLUCOSE, CAPILLARY: GLUCOSE-CAPILLARY: 115 mg/dL — AB (ref 65–99)

## 2016-01-11 MED ORDER — AMIODARONE HCL 200 MG PO TABS: 200.0000 mg | ORAL_TABLET | Freq: Two times a day (BID) | ORAL | Status: DC

## 2016-01-11 MED ORDER — WARFARIN SODIUM 2.5 MG PO TABS: 2.5000 mg | ORAL_TABLET | Freq: Every day | ORAL | Status: DC

## 2016-01-11 MED ORDER — OXYCODONE-ACETAMINOPHEN 10-325 MG PO TABS: 1.0000 | ORAL_TABLET | Freq: Four times a day (QID) | ORAL | Status: DC | PRN

## 2016-01-11 NOTE — Discharge Summary (Signed)
AAA Discharge Summary    [name] 04/05/1946 70 y.o. male  [id number]  Admission Date: 01/03/2016  Discharge Date: 01/11/16  Physician: Conrad Bellefonte, MD  Admission Diagnosis: Abdominal aortic aneurysm I71.4   HPI:   This is a 70 y.o. male who presents with chief complaint: follow up cardiology evaluation. 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 His external iliac arteries look too diseased to tolerate a aortobi-iliac graft. The patient does not have back or abdominal pain. The patient is still smoking. He denies any changes in urinary habit or BP control.  His cardiology risk stratification was high risk for open procedures and moderate risk for percutaneous procedures.   Hospital Course:  The patient was admitted to the hospital and taken to the operating room on 01/03/2016 and underwent: 1. Placement iliac branch device (23 mm x 12 mm x 10 cm) 2. Placement of bifurcated endograft (C3 31 mm x 14 mm x 13 cm) 3. Angioplasty of right external iliac artery (8 mm x 40 mm) 4. Placement right iliac limb extension (12 mm x 10 cm) 5. Explantation of endograft, iliac branch device, and and right iliac limb 6. Aortobifemoral bypass (16 mm x 8 mm) 7. Placement of catheter in aorta x 2 8. Aortogram   9.   Radiologic S&I    Intraoperative findings included the following: 1. 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 Right distal external iliac artery stenosis >50% 3. Right angle in infrarenal aorta which prevents appropriate proximal fixation of the main body endograft 4. Aortic flow compromise from main body endograft overlap with iliac branch  device 5. Palpable anterior tibial artery pulses bilaterally at end of case  The pt tolerated the procedure well and was transported to the ICU intubated in good condition. A critical care consult was obtained.  Later that evening, the pt self extubated and he was monitored closely.  On POD 1, he did have palpable pedal pulses bilaterally.  His NGT was left in place and he was kept npo.  His creatinine was normal with good UOP.  He did have acute surgical blood loss anemia and received 2 units of PRBC's.     On POD 2, he was tachycardic and suspected he had some hemodilution and he did receive another unit of PRBC's given his cardiac hx.  He was started on a PCA and his pain was better controlled.  He was transferred to the ICU stepdown unit.  On POD 3, his NGT was removed, IVF decreased to 50cc/hr and started on clear liquid diet.  On this day he did go into atrial fibrillation and a cardiology consult was obtained.   Per cardiology:   1. New onset atrial fibrillation with RVR post op from vascular surgery. He has a history of LV dysfunction with EF 35-40% by echo in 2016 so will avoid Cardizem. Will start Amio bolus '150mg'$  followed by gtt which will hopefully convert him back to NSR. Will check a TSH. Repeat echo since he has not had one done in a year. If ok with Vascular surgery would start on IV heparin gtt. Mag and K are repleted. Trop neg x 2. His CHADS2VASC score is at least 4 and he has a history  of PAF in the past so would recommend longterm anticoagulation. Could be changed to NOAC prior to discharge. 2. ASCAD with history of PCI of LCx. Continue ASA/statin/BB.  3. Mild to moderate LV dysfunction by echo a year ago - will repeat echo. Continue ACE I and BB. 4. PVD per vascular POD #3 from EVAR conversion to ABF for AAA and bilateral CIA aneurysm 5. Dyslipidemia on statin therapy.  On POD 4, he was feeling a little better.  Discussed weaning PCA.  He tolerated the clear  diet and was advanced to a regular diet.  He was passing flatus and given a dulcolax suppository.   His amiodarone and heparin gtt were continued as he was in and out of afib.   On POD 5, he did ambulate on room air and oxygen saturations dropped down to 80% and he was placed back on [place].  His PCA was discontinued and he was started on po pain medication.  His Afib had converted.  His amiodarone gtt was converted to po.  He did have a repeat echocardiogram with the following results: Study Conclusions  - Left ventricle: The cavity size was normal. There was moderate  concentric hypertrophy. Systolic function was moderately reduced.  The estimated ejection fraction was in the range of 35% to 40%.  Diffuse hypokinesis. Hypokinesis is worse in the inferior and  inferolateral myocardium. Doppler parameters are consistent with  inderminate left ventricular relaxation (grade 1 diastolic  dysfunction). - Aortic valve: Transvalvular velocity was within the normal range.  There was no stenosis. There was no regurgitation. - Mitral valve: Transvalvular velocity was within the normal range.  There was no evidence for stenosis. There was trivial  regurgitation. - Left atrium: The atrium was mildly dilated. - Right ventricle: The cavity size was normal. Wall thickness was  normal. Systolic function was normal. - Tricuspid valve: There was trivial regurgitation.  On POD 6, he was doing well and transferred to the telemetry floor.    Coumadin was started at 7.'5mg'$ .  His INR was 1.5.  He received coumadin '5mg'$  the next day and his INR jumped to 3.12.  The next day, his coumadin was held and his INR was 3.58.  He is instructed not to take any coumadin on 5/5, 5/6 and resume coumadin on 5/7 at 2.'5mg'$  on the recommendations of the in house pharmacist.  He has a follow up with Ardith Dark (PCP) on 01/14/16 at 1pm for visit and INR check.    He is discharged on amiodarone '200mg'$  bid. He has a f/u  appointment with cardiology on 01/24/16.  Per cardiology: 1. Paroxysmal atrial fibrillation, with good rhythm control since adding amiodarone. Mild left atrial enlargement noted on echocardiography. 2. 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 Regional wall motion abnormalities are noted. 3. Anticoagulation therapy with Coumadin has been instituted 4. Coronary artery disease without recent angina or ischemic complications. Prior circumflex stent.  He is discharged on POD 8.  He will be sent home on home O2.    The remainder of the hospital course consisted of increasing mobilization and increasing intake of solids without difficulty.  CBC    Component Value Date/Time   WBC 6.8 01/09/2016 0400   RBC 3.02* 01/09/2016 0400   HGB 8.8* 01/09/2016 0400   HCT 26.4* 01/09/2016 0400   PLT 219 01/09/2016 0400   MCV 87.4 01/09/2016 0400   MCH 29.1 01/09/2016 0400   MCHC 33.3 01/09/2016 0400   RDW 14.9 01/09/2016 0400  LYMPHSABS 0.7 10/14/2014 1727   MONOABS 0.4 10/14/2014 1727   EOSABS 0.0 10/14/2014 1727   BASOSABS 0.1 10/14/2014 1727    BMET    Component Value Date/Time   NA 133* 01/08/2016 0338   K 3.5 01/08/2016 0338   CL 97* 01/08/2016 0338   CO2 28 01/08/2016 0338   GLUCOSE 107* 01/08/2016 0338   BUN 7 01/08/2016 0338   CREATININE 0.61 01/08/2016 0338   CREATININE 0.8 10/08/2015 1500   CALCIUM 7.8* 01/08/2016 0338   GFRNONAA >60 01/08/2016 0338   GFRAA >60 01/08/2016 0338         Discharge Instructions    ABDOMINAL PROCEDURE/ANEURYSM REPAIR/AORTO-BIFEMORAL BYPASS:  Call MD for increased abdominal pain; cramping diarrhea; nausea/vomiting    Complete by:  As directed      Call MD for:  redness, tenderness, or signs of infection (pain, swelling, bleeding, redness, odor or green/yellow discharge around incision site)    Complete by:  As directed      Call MD for:  severe or increased pain, loss or decreased feeling  in affected limb(s)    Complete by:   As directed      Call MD for:  temperature >100.5    Complete by:  As directed      Discharge instructions    Complete by:  As directed   Do not take your coumadin on 01/11/16 or 01/12/16.  Start coumadin 2.'5mg'$  daily on 01/13/16.  Your dose may vary pending the INR (measures how thin your blood is).  You have an appointment on 01/14/16 with Ardith Dark at 1pm for visit and INR check.  Your dose may vary pending those results.     Discharge wound care:    Complete by:  As directed   Wash the groin wound with soap and water daily and pat dry. (No tub bath-only shower)  Then put a dry gauze or washcloth there to keep this area dry daily and as needed.  Do not use Vaseline or neosporin on your incisions.  Only use soap and water on your incisions and then protect and keep dry.     Driving Restrictions    Complete by:  As directed   No driving for 2 weeks     Lifting restrictions    Complete by:  As directed   No lifting for 4 weeks     Resume previous diet    Complete by:  As directed            Discharge Diagnosis:  Abdominal aortic aneurysm I71.4  Secondary Diagnosis: Patient Active Problem List   Diagnosis Date Noted  . Atrial fibrillation with rapid ventricular response ([place]) 01/06/2016  . Abdominal aortic aneurysm (AAA) without rupture ([place]) 01/03/2016  . S/P AAA (abdominal aortic aneurysm) repair   . Tobacco use disorder   . Acute respiratory failure with hypoxia ([place])   . Systolic congestive heart failure ([place])   . Cardiomyopathy, ischemic 10/27/2014  . Chronic cough   . Pulmonary infiltrate   . 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 Bronchiectasis with acute exacerbation ([place]) 10/15/2014  . Tobacco abuse disorder 10/15/2014  . Persistent atrial fibrillation ([place])   . SVT (supraventricular tachycardia) ([place]) 10/14/2014  . 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  Abdominal mass 03/18/2013  . AAA (abdominal aortic aneurysm) without rupture ([place]) 03/18/2013  . Weight loss 05/07/2011  . HYPERCHOLESTEROLEMIA  IIA 09/18/2008  . HYPERTENSION, BENIGN 09/18/2008  . CAD, NATIVE VESSEL 09/18/2008   Past Medical History  Diagnosis Date  . Coronary artery disease     Multivessel, S/p stenting to LCx  . Tobacco dependence   . Ventricular dysfunction     Mild Left  . Dyslipidemia   . Atrial fibrillation (HCC)      Brief episode of  . Myocardial infarction ([place])   . COPD (chronic obstructive pulmonary disease) ([place])   . Peripheral vascular disease ([place])   . Stroke ([place])   . Cardiomyopathy, ischemic 10/27/2014  . AAA (abdominal aortic aneurysm) ([place])   . Hypertension   . Anxiety   . Depression   . Hyperlipidemia   . Shortness of breath dyspnea   . Headache   . Atrial fibrillation with rapid ventricular response ([place]) 01/06/2016       Medication List    STOP taking these medications        oxyCODONE 5 MG immediate release tablet  Commonly known as:  Oxy IR/ROXICODONE      TAKE these medications        amiodarone 200 MG tablet  Commonly known as:  PACERONE  Take 1 tablet (200 mg total) by mouth 2 (two) times daily.     aspirin EC 81 MG tablet  Take 81 mg by mouth daily.     atorvastatin 80 MG tablet  Commonly known as:  LIPITOR  Take 1 tablet (80 mg total) by mouth daily at 6 PM.     BREO ELLIPTA 100-25 MCG/INH Aepb  Generic drug:  fluticasone furoate-vilanterol  Inhale 1 puff into the lungs daily.     escitalopram 20 MG tablet  Commonly known as:  LEXAPRO  Take 20 mg by mouth daily.     lisinopril 10 MG tablet  Commonly known as:  PRINIVIL,ZESTRIL  Take 10 mg by mouth daily.     metoprolol tartrate 25 MG tablet  Commonly known as:  LOPRESSOR  Take 2 tablets (50 mg total) by mouth 2 (two) times daily.     mirtazapine 30 MG disintegrating tablet  Commonly known as:  REMERON SOL-TAB  Take 30 mg by mouth at bedtime.      NASONEX 50 MCG/ACT nasal spray  Generic drug:  mometasone  Place 2 sprays into the nose daily as needed (for sinus).     nitroGLYCERIN 0.4 MG SL tablet  Commonly known as:  NITROSTAT  Place 0.4 mg under the tongue every 5 (five) minutes as needed for chest pain.     oxyCODONE-acetaminophen 10-325 MG tablet  Commonly known as:  PERCOCET  Take 1 tablet by mouth every 6 (six) hours as needed for pain.     PROAIR HFA 108 (90 Base) MCG/ACT inhaler  Generic drug:  albuterol  Inhale 1-2 puffs into the lungs every 6 (six) hours as needed for wheezing or shortness of breath.     tiotropium 18 MCG inhalation capsule  Commonly known as:  SPIRIVA  Place 18 mcg into inhaler and inhale daily.     tiZANidine 4 MG tablet  Commonly known as:  ZANAFLEX  Take 4-8 mg by mouth 2 (two) times daily. PT TAKES 2 TABLETS IN THE MORNING, 1 TABLET AT BEDTIME     warfarin 2.5 MG tablet  Commonly known as:  COUMADIN  Take 1 tablet (2.5 mg total) by mouth daily.  Start taking on:  01/13/2016        Prescriptions given: 1.  Percocet #10  No Refill 2.  Amiodarone '200mg'$  bid #60 NR (refills per cardiology) 3.  Coumadin 2.'5mg'$  daily starting 01/13/16.   (discussed with pt)  Instructions: 1.  Wash the groin wound with soap and water daily and pat dry. (No tub bath-only shower)  Then put a dry gauze or washcloth there to keep this area dry daily and as needed.  Do not use Vaseline or neosporin on your incisions.  Only use soap and water on your incisions and then protect and keep dry.  Disposition: home  Patient's condition: is Good  Follow up: 1. Dr. Bridgett Larsson in 2 weeks 2. Ardith Dark 01/14/16 at 1pm for appointment and INR check. (spoke with Talitha to schedule).  Discussed with pt and he is aware of his appointment.   3. Cardiology 01/30/16 at 1:30   [name], PA-C [facility] [phone] 01/11/2016  9:22 AM  Addendum  I have independently interviewed and examined the patient,  and I agree with the physician assistant's discharge summary.  This patient had undergone substantial preoperative planning and risk stratification given his history of CAD and COPD.  Initially, I felt he would be a better open candidate than endovascular but his cardiac risk stratification was high risk, so I felt an attempt at endovascular repair of his AAA and bilateral common iliac artery aneurysm was indicated.  There were multiple technical issues with the iliac branch device and the main body endograft.  Essentially a 90 degree turn at the aortic neck made it impossible for the graft to sit at the infrarenal position.  When the main body dropped down, it became a flow obstruction to the iliac branch device, subsequently, I felt conversion to an ABF was necessary.  This was completed in a expeditious fashion with the aid of Dr. Scot Dock.  The aortobifemoral bypass with ligation of bilateral common iliac artery was was completed rapidly.  The patient was kept intubated post-operatively given the extended length of surgery and COPD history.  His post-operative course was unremarkable: getting extubated on POD #1, gradual regaining bowel function and weaning down oxygen requirement.  He did go back into afib with a RVR pattern as he had previously.  Cardiology recommended loading him on amiodarone and heparin bridge to coumadin.  This was completed and the patient eventually flipped back into NSR on po Amiodarone and Coumadin.  By discharge, he is ambulating without difficult, pain is well controlled, and he is tolerating his diet.  He will return to the office in 2 weeks for staple removal.  - Follow up with Cardiology in next 2 weeks - Follow up with PCP on Monday for Coumadin titration - Follow up with me in 2 weeks for wound checks   Adele Barthel, MD [facility] [place] Office: [phone] Pager: [phone]  01/11/2016, 3:59 PM    - For VQI Registry use ---   Post-op:   Time to Extubation: '[]'$  In OR, [x ] < 12 hrs (self extubated), '[ ]'$  12-24 hrs, '[ ]'$  >=24 hrs Vasopressors Req. Post-op: No ICU Stay: 2 days in ICU and 4 days in the stepdown ICU Transfusion: Yes  If yes, 5 units given MI: No, '[ ]'$  Troponin only, '[ ]'$  EKG or Clinical New Arrhythmia: Yes-Atrial Fibrillation  Complications: CHF: No Resp failure: No, '[ ]'$  Pneumonia, '[ ]'$  Ventilator Chg in renal function: No, '[ ]'$  Inc. Cr > 0.5, '[ ]'$  Temp. Dialysis, '[ ]'$  Permanent dialysis Leg ischemia: No, no Surgery needed, '[ ]'$   Yes, Surgery needed, '[ ]'$  Amputation Bowel ischemia: No, '[ ]'$  Medical Rx, '[ ]'$  Surgical Rx Wound complication: No, '[ ]'$  Superficial separation/infection, '[ ]'$  Return to OR Return to OR: No  Return to OR for bleeding: No Stroke: No, '[ ]'$  Minor, '[ ]'$  Major  Discharge medications: Statin use:  Yes If No: '[ ]'$  For Medical reasons, '[ ]'$  Non-compliant ASA use:  Yes  If No: '[ ]'$  For Medical reasons, '[ ]'$  Non-compliant Plavix use:  No If No: '[ ]'$  For Medical reasons, '[ ]'$  Non-compliant Beta blocker use:  Yes If No: '[ ]'$  For Medical reasons, '[ ]'$  Non-compliant ACEI use:  Yes ARB use:  No Coumadin:  Yes

## 2016-01-11 NOTE — Progress Notes (Signed)
Patients 02 stats are currently at 88% of room air, resting.  [name]

## 2016-01-11 NOTE — Progress Notes (Signed)
Patient walked 179f in the hall on room air. Stats remained in mid to upper 80s. Ranging from 85-88% walking on room air. Oxygen saturation varies while sitting in room from upper 80s to low 90s. Ranging from 87-93% room air. On 2L of 02 nasal cannula patients oxygen saturation ranged in mid 90s.  TCyndia Bent

## 2016-01-11 NOTE — Telephone Encounter (Signed)
-----   Message from Mena Goes, RN sent at 01/11/2016 11:05 AM EDT ----- Regarding: schedule   ----- Message -----    From: Gabriel Earing, PA-C    Sent: 01/11/2016  10:28 AM      To: Mena Goes, RN  Why is that important?!?!?! Just kidding-I had a brain fart! 2 weeks! See ya this afternoon!!  Aldona Bar   ----- Message -----    From: Mena Goes, RN    Sent: 01/11/2016   9:25 AM      To: Gabriel Earing, PA-C  What timeframe for this?   ----- Message -----    From: Gabriel Earing, PA-C    Sent: 01/11/2016   7:48 AM      To: Vvs Charge Pool  S/p EVAR converted to open aortobifem bypass grafting.  He will need staples removed at that time also.  Thanks, Aldona Bar

## 2016-01-11 NOTE — Progress Notes (Signed)
  Was notified by vascular surgery that patient will be discharged home today. We were asked to review cardiac meds and give recommendations regarding amiodarone. This was started for Afib. Dose was decreased to 200 mg BID on 01/10/16. He will need to continue 200 mg BID on discharge. We will reassess him in clinic and will further reduce amiodarone, if still maintaining NSR. We have arranged OP f/u at our [place] with a PA. Date and time is on AVS.   Olson, Daniel 01/11/2016

## 2016-01-11 NOTE — Care Management Note (Signed)
Case Management Note Previous CM note initiated by Tomi Bamberger RN, CM  Patient Details  Name: [name] MRN: [id number] Date of Birth: Dec 11, 1945  Subjective/Objective:      Patient is s/pe endovascular aneurysm repair for AAA. Post operatively complicated by afib with rvr, conts on amiodarone and heparin drip, will d/c pca and transition to po's. Advance diet . Per pt /ot eval rec SNF , patient is refusing snf, stating he will go home with son who works, while son is working his brother [name] will be with him. NCM spoke with patient about setting up [facility] services for him when he gets home if he does not want snf patient states he does not want [place] services. NCM will check back with patient tomorrow to see if he has changed his mind.   5/2 1433- NCM spoke with patient, he states he still does not want [facility] services. NCM will cont to follow for dc needs, he may need home oxygen at dc.               Action/Plan:   Expected Discharge Date: 01/11/16               Expected Discharge Plan:  [place]  In-House Referral:  Clinical Social Work  Discharge planning Services  CM Consult  Post Acute Care Choice:  Durable Medical Equipment, Home Health Choice offered to:  Patient  DME Arranged:  Oxygen DME Agency:  [place] Arranged:  PT, Respirator Therapy [place] Agency:  Home  Status of Service:  Completed, signed off  Medicare Important Message Given:  Yes Date Medicare IM Given:    Medicare IM give by:    Date Additional Medicare IM Given:    Additional Medicare Important Message give by:     If discussed at [place] of Stay Meetings, dates discussed:    Discharge Disposition: Home/Home Health  Additional Comments:  01/11/16- 32- Marvetta Gibbons RN, BSN- pt for d/c home today- orders have been placed for [facility]- PT/Resp. - in to speak with pt at bedside- pt states that he really does not feel like he needs home health -  states that he doesn't really want people coming into his home. Discussed with pt the recommendations by PT and that the doctor also feels like [place] would benefit him- pt then agreeable to [facility] services after discussion- choice offered to pt for [facility] agencies- per pt choice -[facility] was selected for services- referral called to [place] with [facility] for [facility]- pt may need home 02 also- bedside RN to do ambulating test to check 02 sats to see if pt qualifies for home 02- per pt he already has a RW at home.  1155- pt has qualified for home 02- spoke with PA- Aldona Bar R.- who will place home 02 order- call made to [facility] with [facility] for home 02 needs- portable tank to be delivered to room prior to discharge.   Dawayne Patricia, RN 01/11/2016, 11:07 AM

## 2016-01-11 NOTE — Telephone Encounter (Signed)
sched appt 5/18 at 9:15. Pt's ph# not accepting calls. Emergency contact's # is disconnected. Mailed appt letter through regular mail to inform pt of appt.

## 2016-01-11 NOTE — Progress Notes (Signed)
R DL IJ removed. Patient tolerated well. Removed 3 sutures. Held pressure in place, applied dressing.  [name]

## 2016-01-11 NOTE — Progress Notes (Addendum)
  Progress Note    01/11/2016 7:49 AM 8 Days Post-Op  Subjective:  No appetite-hospital food smell makes him nauseous   Afebrile HR  60's-70's NSR 161'W-960'A systolic 54% [id number]  Filed Vitals:   01/10/16 2035 01/11/16 0404  BP: 122/64 136/66  Pulse: 72 71  Temp: 98.4 F (36.9 C) 98.1 F (36.7 C)  Resp: 18 16    Physical Exam: Lungs:  Non labored Incisions:  Healing appropriately Abdomen:  Soft, NT/ND  CBC    Component Value Date/Time   WBC 6.8 01/09/2016 0400   RBC 3.02* 01/09/2016 0400   HGB 8.8* 01/09/2016 0400   HCT 26.4* 01/09/2016 0400   PLT 219 01/09/2016 0400   MCV 87.4 01/09/2016 0400   MCH 29.1 01/09/2016 0400   MCHC 33.3 01/09/2016 0400   RDW 14.9 01/09/2016 0400   LYMPHSABS 0.7 10/14/2014 1727   MONOABS 0.4 10/14/2014 1727   EOSABS 0.0 10/14/2014 1727   BASOSABS 0.1 10/14/2014 1727    BMET    Component Value Date/Time   NA 133* 01/08/2016 0338   K 3.5 01/08/2016 0338   CL 97* 01/08/2016 0338   CO2 28 01/08/2016 0338   GLUCOSE 107* 01/08/2016 0338   BUN 7 01/08/2016 0338   CREATININE 0.61 01/08/2016 0338   CREATININE 0.8 10/08/2015 1500   CALCIUM 7.8* 01/08/2016 0338   GFRNONAA >60 01/08/2016 0338   GFRAA >60 01/08/2016 0338    INR    Component Value Date/Time   INR 3.58* 01/11/2016 0627     Intake/Output Summary (Last 24 hours) at 01/11/16 0749 Last data filed at 01/11/16 0600  Gross per 24 hour  Intake    480 ml  Output    700 ml  Net   -220 ml     Assessment:  70 y.o. male is s/p:  1. Placement iliac branch device (23 mm x 12 mm x 10 cm) 2. Placement of bifurcated endograft (C3 31 mm x 14 mm x 13 cm) 3. Angioplasty of right external iliac artery (8 mm x 40 mm) 4. Placement right iliac limb extension (12 mm x 10 cm) 5. Explantation of endograft, iliac branch device, and and right iliac limb 6. Aortobifemoral bypass (16 mm x 8 mm) 7. Placement of catheter in aorta x 2 Aortogram   8 Days Post-Op   Plan: -pt doing  well this morning -poor appetite -will call cardiology to discuss amiodarone Rx and f/u appt  -will d/w pharmacy about coumadin dosing. -pt will need to f/u with Dr. Almedia Balls (PCP in [facility]) on Monday for INR check. -DVT prophylaxis:  Coumadin -he will need home O2 -face to face order put in for home O2 and HHPT   [name], PA-C [facility] [phone] 01/11/2016 7:49 AM   Addendum  I have independently interviewed and examined the patient, and I agree with the physician assistant's findings.  Inc all look good.  Ok to D/C once home services and coumadin arranged.  This was not completed yesterday afternoon, so discharge was delayed.  Adele Barthel, MD [facility] [place] Office: [phone] Pager: [phone]  01/11/2016, 9:09 AM

## 2016-01-11 NOTE — Care Management Note (Signed)
SATURATION QUALIFICATIONS: (This note is used to comply with regulatory documentation for home oxygen)  Patient Saturations on Room Air at Rest = 88%  Patient Saturations on Room Air while Ambulating = 85%  Patient Saturations on 2 Liters of oxygen while Ambulating =  95%  Please briefly explain why patient needs home oxygen: hx COPD

## 2016-01-11 NOTE — Progress Notes (Signed)
ANTICOAGULATION CONSULT NOTE - Follow Up Consult  Pharmacy Consult:  Heparin / Coumadin Indication: atrial fibrillation  Allergies  Allergen Reactions  . Aspirin Nausea Only and Other (See Comments)    Stomach upset  . Ibuprofen Hives    Patient Measurements: Height: '5\' 6"'$  (167.6 cm) Weight: 125 lb 1.6 oz (56.745 kg) IBW/kg (Calculated) : 63.8  Heparin dosing weight = 63 kg  Vital Signs: Temp: 98.1 F (36.7 C) (05/05 0404) Temp Source: Oral (05/05 0404) BP: 136/66 mmHg (05/05 0404) Pulse Rate: 71 (05/05 0404)  Labs:  Recent Labs  01/08/16 1322 01/09/16 0400 01/10/16 0605 01/11/16 0627  HGB  --  8.8*  --   --   HCT  --  26.4*  --   --   PLT  --  219  --   --   LABPROT  --  18.4* 32.0* 35.0*  INR  --  1.52* 3.18* 3.58*  HEPARINUNFRC 0.37 0.40 0.28*  --     Estimated Creatinine Clearance: 70.9 mL/min (by C-G formula based on Cr of 0.61).  Assessment: 18 YOM to continue on Heparin / coumadin bridge for new onset Afib (CHADsVASc = 4), s/p vascular surgery 4/28. Started Coumadin on 5/2. INR was 1.33 on 4/27. INR up to 3.58 today largely due to the interaction with amio.   Heparin now off with elevated INR, long discussion about coumadin with patient this morning. He seemed to have a fairly good understanding, expressed the importance of close followup with his large bump in INR overnight.  D/w with PA this AM, going to hold coumadin today and tomorrow. Give 2.'5mg'$  Sunday then recheck INR Monday.  Goal of Therapy:  Heparin level 0.3-0.7 units/ml  INR 2 - 3 Monitor platelets by anticoagulation protocol: Yes   Plan:   No Coumadin today or tomorrow Coumadin 2.'5mg'$  Sunday Recheck INR Monday  Onnie Boer, PharmD Pager: [phone] 01/11/2016 8:43 AM

## 2016-01-18 ENCOUNTER — Encounter: Payer: Self-pay | Admitting: Vascular Surgery

## 2016-01-22 NOTE — Progress Notes (Signed)
    Patient did not show.    ROS    This encounter was created in error - please disregard.

## 2016-01-24 ENCOUNTER — Encounter: Payer: Self-pay | Admitting: Vascular Surgery

## 2016-01-24 ENCOUNTER — Ambulatory Visit (INDEPENDENT_AMBULATORY_CARE_PROVIDER_SITE_OTHER): Payer: Medicare Other | Admitting: Vascular Surgery

## 2016-01-24 VITALS — BP 156/85 | HR 68 | Temp 97.6°F | Resp 18 | Ht 66.0 in | Wt 116.0 lb

## 2016-01-24 DIAGNOSIS — I714 Abdominal aortic aneurysm, without rupture, unspecified: Secondary | ICD-10-CM

## 2016-01-24 DIAGNOSIS — I723 Aneurysm of iliac artery: Secondary | ICD-10-CM

## 2016-01-24 NOTE — Progress Notes (Signed)
Filed Vitals:   01/24/16 0925 01/24/16 0927  BP: 159/82 156/85  Pulse: 68 68  Temp: 97.6 F (36.4 C)   Resp: 18   Height: '5\' 6"'$  (1.676 m)   Weight: 116 lb (52.617 kg)   SpO2: 99%

## 2016-01-24 NOTE — Progress Notes (Signed)
Post-operative EVAR  ABF   History of Present Illness  [name] is a 70 y.o. male who presents post-op s/p EVAR converted to ABF (Date: 01/04/16).  The patient turned out to have a 90 degree turn in his aortic neck.  This prevented the main body from stay up in the aortic neck.  The main body fell down into the iliac branch device, impeding intra-aortic blood flow, thus requiring conversion.  The patient has had no back or abdominal pain.  During his recent admission, he developed repeat Afib with RVR pattern.  This required addition of Amiodarone and Coumadin.   Current Outpatient Prescriptions  Medication Sig Dispense Refill  . albuterol (PROAIR HFA) 108 (90 Base) MCG/ACT inhaler Inhale 1-2 puffs into the lungs every 6 (six) hours as needed for wheezing or shortness of breath.     [name] amiodarone (PACERONE) 200 MG tablet Take 1 tablet (200 mg total) by mouth 2 (two) times daily. 60 tablet 0  . aspirin EC 81 MG tablet Take 81 mg by mouth daily.    [name] atorvastatin (LIPITOR) 80 MG tablet Take 1 tablet (80 mg total) by mouth daily at 6 PM. 30 tablet 5  . BREO ELLIPTA 100-25 MCG/INH AEPB Inhale 1 puff into the lungs daily.  1  . escitalopram (LEXAPRO) 20 MG tablet Take 20 mg by mouth daily.    [name] lisinopril (PRINIVIL,ZESTRIL) 10 MG tablet Take 10 mg by mouth daily.    . metoprolol tartrate (LOPRESSOR) 25 MG tablet Take 2 tablets (50 mg total) by mouth 2 (two) times daily. 30 tablet 10  . mirtazapine (REMERON SOL-TAB) 30 MG disintegrating tablet Take 30 mg by mouth at bedtime.     . mometasone (NASONEX) 50 MCG/ACT nasal spray Place 2 sprays into the nose daily as needed (for sinus).     . nitroGLYCERIN (NITROSTAT) 0.4 MG SL tablet Place 0.4 mg under the tongue every 5 (five) minutes as needed for chest pain.     [name] oxyCODONE-acetaminophen (PERCOCET) 10-325 MG tablet Take 1 tablet by mouth every 6 (six) hours as needed for pain. 10 tablet 0  . tiotropium (SPIRIVA) 18 MCG inhalation capsule  Place 18 mcg into inhaler and inhale daily.    [name] tiZANidine (ZANAFLEX) 4 MG tablet Take 4-8 mg by mouth 2 (two) times daily. PT TAKES 2 TABLETS IN THE MORNING, 1 TABLET AT BEDTIME    . warfarin (COUMADIN) 2.5 MG tablet Take 1 tablet (2.5 mg total) by mouth daily. 30 tablet 2   No current facility-administered medications for this visit.    For VQI Use Only  PRE-ADM LIVING: Home  AMB STATUS: Ambulatory  Physical Examination  Filed Vitals:   01/24/16 0925 01/24/16 0927  BP: 159/82 156/85  Pulse: 68 68  Temp: 97.6 F (36.4 C)   Resp: 18     Vascular: Vessel Right Left  Aorta Non-palpable N/A  Femoral Palpable Palpable  Popliteal Non-palpable Non-palpable  PT Palpable Palpable  DP Faintly Palpable Faintly Palpable   Gastrointestinal: soft, NTND, -G/R, - HSM, - masses, - CVAT B  Medical Decision Making  [name] is a 70 y.o. 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  Thank you for allowing [place] to  participate in this patient's care.  Adele Barthel, MD [facility] [place] Office: [phone] Pager: [phone]  01/24/2016, 8:41 AM

## 2016-01-30 ENCOUNTER — Encounter: Payer: Medicare Other | Admitting: Physician Assistant

## 2016-02-05 ENCOUNTER — Ambulatory Visit: Payer: Medicare Other | Admitting: Cardiovascular Disease

## 2016-02-14 ENCOUNTER — Encounter: Payer: Self-pay | Admitting: Family

## 2016-02-14 ENCOUNTER — Ambulatory Visit (INDEPENDENT_AMBULATORY_CARE_PROVIDER_SITE_OTHER): Payer: Medicare Other | Admitting: Family

## 2016-02-14 VITALS — BP 161/87 | HR 52 | Ht 66.0 in | Wt 123.0 lb

## 2016-02-14 DIAGNOSIS — I722 Aneurysm of renal artery: Secondary | ICD-10-CM

## 2016-02-14 DIAGNOSIS — I714 Abdominal aortic aneurysm, without rupture, unspecified: Secondary | ICD-10-CM

## 2016-02-14 DIAGNOSIS — I723 Aneurysm of iliac artery: Secondary | ICD-10-CM

## 2016-02-14 DIAGNOSIS — I7773 Dissection of renal artery: Secondary | ICD-10-CM

## 2016-02-14 DIAGNOSIS — Z720 Tobacco use: Secondary | ICD-10-CM

## 2016-02-14 DIAGNOSIS — F17200 Nicotine dependence, unspecified, uncomplicated: Secondary | ICD-10-CM

## 2016-02-14 NOTE — Progress Notes (Signed)
Post-operative Open AAA Repair  History of Present Illness  [name] is a 70 y.o. male patient of Dr. Bridgett Larsson who presents post-op s/p open AAA repair (Date: 01/03/16). He also had on 01/03/16  Placement iliac branch device (23 mm x 12 mm x 10 cm)  Placement of bifurcated endograft (C3 31 mm x 14 mm x 13 cm)  Angioplasty of right external iliac artery (8 mm x 40 mm)  Placement right iliac limb extension (12 mm x 10 cm)  Explantation of endograft, iliac branch device, and and right iliac limb  Aortobifemoral bypass (16 mm x 8 mm)  Placement of catheter in aorta x 2  Aortogram             Radiologic S&I  The patient turned out to have a 90 degree turn in his aortic neck. This prevented the main body from stay up in the aortic neck. The main body fell down into the iliac branch device, impeding intra-aortic blood flow, thus requiring conversion. The patient has had no back or abdominal pain. During his recent admission, he developed repeat Afib with RVR pattern. This required addition of Amiodarone and Coumadin.   He returns today for removal of one remaining staple from his midline abdominal incision.  He denies fever or chills.    The patient has not had back or abdominal pain.  He denies chest pain, denies dyspnea.   He has been taking narcotic analgesics prior to his AAA and bilateral iliac artery aneurysm repair as prescribed by his PCP for chronic c-spine pain. He was moving his bowels regularly up until a couple of weeks ago, lately having constipation issues, is passing gas. He has been taking a stool softener every other day, and rectal suppositories as needed.  He does not have DM, but unfortunately continues to smoke 1/2 ppd.    For VQI Use Only  PRE-ADM LIVING: Home  AMB STATUS: Ambulatory  Physical Examination  Filed Vitals:   02/14/16 1531  BP: 161/87  Pulse: 52  Height: '5\' 6"'$  (1.676 m)  Weight: 123 lb (55.792 kg)  SpO2: 99%   Body mass  index is 19.86 kg/(m^2).  Gastrointestinal: soft, NTND, -G/R, - HSM, - palpable masses, - CVAT B, Normal bowel sounds in all quadrants.   Medical Decision Making  [name] is a 70 y.o. male who presents s/p EVAR converted to ABF on 01/03/16 for AAA and B CIA aneurysm. The patient was counseled re smoking cessation and given several free resources re smoking cessation.   Pt is healing appropriately from his EVAR to ABF conversion.  Last remaining staple was removed today.  Constipation from use of narcotic analgesics for chronic c-spine pain: He is taking stool softener every other day, I advised to take daily, add daily psyllium seed fiber, drink apple juice daily since he does not like drinking water, and use Miralax if needed.         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 Lianne Moris recommendations on 01/24/16 when Dr. Bridgett Larsson last evaluated pt. This will be due in May 2018; I will see pt on a day that Dr. Bridgett Larsson is in the office. I advised pt to notify our office if he develops abdominal pain, fever, or chills.   Thank you for allowing [place] to participate in this patient's care.  Anitra Doxtater, Sharmon Leyden, RN,  MSN, FNP-C [facility] [place] Office: [phone]  02/14/2016, 4:16 PM  Clinic MD: Oneida Alar

## 2016-02-15 ENCOUNTER — Other Ambulatory Visit: Payer: Self-pay | Admitting: Physician Assistant

## 2016-03-04 ENCOUNTER — Encounter: Payer: Self-pay | Admitting: Cardiovascular Disease

## 2016-03-04 ENCOUNTER — Ambulatory Visit (INDEPENDENT_AMBULATORY_CARE_PROVIDER_SITE_OTHER): Payer: Medicare Other | Admitting: Cardiovascular Disease

## 2016-03-04 VITALS — BP 104/80 | HR 54 | Ht 66.0 in | Wt 120.6 lb

## 2016-03-04 DIAGNOSIS — I255 Ischemic cardiomyopathy: Secondary | ICD-10-CM | POA: Diagnosis not present

## 2016-03-04 DIAGNOSIS — I2510 Atherosclerotic heart disease of native coronary artery without angina pectoris: Secondary | ICD-10-CM | POA: Diagnosis not present

## 2016-03-04 DIAGNOSIS — Z9889 Other specified postprocedural states: Secondary | ICD-10-CM

## 2016-03-04 DIAGNOSIS — I4891 Unspecified atrial fibrillation: Secondary | ICD-10-CM

## 2016-03-04 MED ORDER — AMIODARONE HCL 200 MG PO TABS: 200.0000 mg | ORAL_TABLET | Freq: Every day | ORAL | Status: DC

## 2016-03-04 MED ORDER — METOPROLOL TARTRATE 25 MG PO TABS: 25.0000 mg | ORAL_TABLET | Freq: Two times a day (BID) | ORAL | Status: DC

## 2016-03-04 NOTE — Patient Instructions (Signed)
Medication Instructions:  DECREASE Amiodarone to 200 mg daily DECREASE Metoprolol to 25 mg twice daily   Labwork: None Ordered   Testing/Procedures: None Ordered   Follow-Up: Your physician wants you to follow-up in: 3 months with Dr. [name].  You will receive a reminder letter in the mail two months in advance. If you don't receive a letter, please call our office to schedule the follow-up appointment.   If you need a refill on your cardiac medications before your next appointment, please call your pharmacy.   Thank you for choosing [facility]! Christen Bame, RN [phone]

## 2016-03-04 NOTE — Progress Notes (Signed)
[name] Date of Birth  08/20/1946 [facility] [address]     [place], [place]  [address] [phone]  Fax  [phone]  Problem list 1. Coronary artery disease-status post stenting of his circumflex complex artery 2. Abdominal aortic aneurysm repair 3. Common femoral aneurism repair  4. COPD  5. Chronic systolic CHF:   EF 73-71%   6. Smoker  7. Paroxysmal atrial fibrillation   History of Present Illness:  70 year old gentleman with a history of coronary disease. He is status post stenting of his left circumflex artery.  He's concerned about his weight loss. He plays lost around 30-35 pounds over the past one year.  He denies any episodes of angina. He denies any syncope or presyncope.  He does not get any regular exercise. He is able to do all of his normal daily activities without any significant problems.  March 02, 2013:  Ajani has lost 9 lbs since his visit last years.  He had lost 30 lbs prior to that visit.  He is seeing Dr. Almedia Balls.    He is not having any angina.  He has chronic dyspnea - still smoking.    March 24, 2014:  March 04, 2016:  Has had abdominal aortic aneurysm surgery as well as bilateral common iliac artery. He continues to slowly recover. He's not had any cardiac complications.  He has had some orthostatic hypertension that started after his surgery.  He's not eating or drinking very well. His appetite remains poor after surgery. He continues to smoke.  Echo in the hospital reveals chronic systolic chf - EF 06-26%.    Current Outpatient Prescriptions on File Prior to Visit  Medication Sig Dispense Refill  . albuterol (PROAIR HFA) 108 (90 Base) MCG/ACT inhaler Inhale 1-2 puffs into the lungs every 6 (six) hours as needed for wheezing or shortness of breath.     [name] amiodarone (PACERONE) 200 MG tablet Take 1 tablet (200 mg total) by mouth 2 (two) times daily. 60 tablet 0  . aspirin EC 81  MG tablet Take 81 mg by mouth daily.    [name] atorvastatin (LIPITOR) 80 MG tablet Take 1 tablet (80 mg total) by mouth daily at 6 PM. 30 tablet 5  . BREO ELLIPTA 100-25 MCG/INH AEPB Inhale 1 puff into the lungs daily.  1  . escitalopram (LEXAPRO) 20 MG tablet Take 20 mg by mouth daily.    [name] lisinopril (PRINIVIL,ZESTRIL) 10 MG tablet Take 10 mg by mouth daily.    . metoprolol tartrate (LOPRESSOR) 25 MG tablet Take 2 tablets (50 mg total) by mouth 2 (two) times daily. 30 tablet 10  . mirtazapine (REMERON SOL-TAB) 30 MG disintegrating tablet Take 30 mg by mouth at bedtime.     . mometasone (NASONEX) 50 MCG/ACT nasal spray Place 2 sprays into the nose daily as needed (for sinus).     . nitroGLYCERIN (NITROSTAT) 0.4 MG SL tablet Place 0.4 mg under the tongue every 5 (five) minutes as needed for chest pain.     [name] oxyCODONE-acetaminophen (PERCOCET) 10-325 MG tablet Take 1 tablet by mouth every 6 (six) hours as needed for pain. 10 tablet 0  . tiotropium (SPIRIVA) 18 MCG inhalation capsule Place 18 mcg into inhaler and inhale daily.    [name] tiZANidine (ZANAFLEX) 4 MG tablet Take 4-8 mg by mouth 2 (two) times daily. PT TAKES 2 TABLETS IN THE MORNING, 1 TABLET AT BEDTIME    . warfarin (COUMADIN)  2.5 MG tablet Take 1 tablet (2.5 mg total) by mouth daily. 30 tablet 2   No current facility-administered medications on file prior to visit.    Allergies  Allergen Reactions  . Aspirin Nausea Only and Other (See Comments)    Stomach upset  . Ibuprofen Hives    Past Medical History  Diagnosis Date  . Coronary artery disease     Multivessel, S/p stenting to LCx  . Tobacco dependence   . Ventricular dysfunction     Mild Left  . Dyslipidemia   . Atrial fibrillation (HCC)      Brief episode of  . Myocardial infarction ([place])   . COPD (chronic obstructive pulmonary disease) ([place])   . Peripheral vascular disease ([place])   . Stroke ([place])   . Cardiomyopathy, ischemic 10/27/2014  . AAA (abdominal aortic aneurysm)  ([place])   . Hypertension   . Anxiety   . Depression   . Hyperlipidemia   . Shortness of breath dyspnea   . Headache   . Atrial fibrillation with rapid ventricular response ([place]) 01/06/2016    Past Surgical History  Procedure Laterality Date  . US echocardiography  12-26-2008    EF 40-45%  . Back surgery      cervical  . Stent      pt unsure of location  . Left heart catheterization with coronary angiogram N/A 10/27/2014    Procedure: LEFT HEART CATHETERIZATION WITH CORONARY ANGIOGRAM;  Surgeon: Blane Ohara, MD;  Location: [facility] CATH LAB;  Service: Cardiovascular;  Laterality: N/A;  . Wrist surgery Right   . Hand surgery Left     thumb surgery  . Hernia repair Right   . Abdominal aortic endovascular stent graft N/A 01/03/2016    Procedure: ABDOMINAL AORTIC ENDOVASCULAR STENT GRAFT IMPLANTED/EXPLANTED;  Surgeon: Conrad Ridgeway, MD;  Location: [facility];  Service: Vascular;  Laterality: N/A;  . Abdominal aortic aneurysm repair  01/03/2016    Procedure: ANEURYSM ABDOMINAL AORTIC REPAIR USING 14MM X 8MM X40CM HEMASHIELD GOLD GRAFT;  Surgeon: Conrad Crosby, MD;  Location: [facility];  Service: Vascular;;    History  Smoking status  . Current Every Day Smoker -- 0.50 packs/day for 50 years  . Types: Cigarettes  Smokeless tobacco  . Never Used    History  Alcohol Use No    Family History  Problem Relation Age of Onset  . AAA (abdominal aortic aneurysm) Mother   . Cancer Father     bone marrow    Reviw of Systems:  Reviewed in the HPI.  All other systems are negative.  Physical Exam: BP 104/80 mmHg  Pulse 54  Ht '5\' 6"'$  (1.676 m)  Wt 120 lb 9.6 oz (54.704 kg)  BMI 19.47 kg/m2 The patient is alert and oriented x 3.  The mood and affect are normal.   Skin: warm and dry.  Color is normal.    HEENT:   the sclera are nonicteric.  The mucous membranes are moist.  The carotids are 2+ without bruits.  There is no thyromegaly.  There is no JVD.    Lungs: slight wheezing.     Heart:  regular rate with a normal S1 and S2.  There are no murmurs, gallops, or rubs. The PMI is not displaced.     Abdomen: good bowel sounds.  There is no guarding or rebound.      Extremities:  no clubbing, cyanosis, or edema.  The legs are without rashes.  The distal pulses are intact.  Neuro:  Cranial nerves II - XII are intact.  Motor and sensory functions are intact.    The gait is normal.  ECG: March 04, 2016:   Sinus brady at 52.   NS T wave abn.   Assessment / Plan:   1. Coronary artery disease-status post stenting of his circumflex complex artery -he's doing well. He's not having any angina. I've recommended he stop smoking.  2. Abdominal aortic aneurysm repair 3. Common femoral aneurism repair  4. COPD - still smoking , advised him to stop  5. Chronic systolic CHF:   EF 02-40%  - continue medical therapy  6. Smoker  7. Paroxysmal atrial fibrillation. He is in normal sinus rhythm today. We will decrease the amiodarone to 200 mg once a day. His heart rate is fairly slow. We'll decrease the metoprolol to 25 mg twice a day.   Mertie Moores, MD  03/04/2016 4:16 PM    [facility] [place],  [place], [place]  [address] Pager [phone] Phone: [phone]; Fax: [phone]

## 2016-04-08 ENCOUNTER — Other Ambulatory Visit: Payer: Self-pay | Admitting: Vascular Surgery

## 2016-04-08 DIAGNOSIS — I714 Abdominal aortic aneurysm, without rupture, unspecified: Secondary | ICD-10-CM

## 2016-04-08 DIAGNOSIS — I723 Aneurysm of iliac artery: Secondary | ICD-10-CM

## 2016-06-05 NOTE — Progress Notes (Signed)
This encounter was created in error - please disregard.

## 2016-09-18 DIAGNOSIS — I10 Essential (primary) hypertension: Secondary | ICD-10-CM | POA: Diagnosis not present

## 2016-09-18 DIAGNOSIS — Z79899 Other long term (current) drug therapy: Secondary | ICD-10-CM | POA: Diagnosis not present

## 2016-09-18 DIAGNOSIS — F329 Major depressive disorder, single episode, unspecified: Secondary | ICD-10-CM | POA: Diagnosis not present

## 2016-09-18 DIAGNOSIS — G8928 Other chronic postprocedural pain: Secondary | ICD-10-CM | POA: Diagnosis not present

## 2016-10-16 DIAGNOSIS — F329 Major depressive disorder, single episode, unspecified: Secondary | ICD-10-CM | POA: Diagnosis not present

## 2016-10-16 DIAGNOSIS — G8921 Chronic pain due to trauma: Secondary | ICD-10-CM | POA: Diagnosis not present

## 2016-10-16 DIAGNOSIS — G8928 Other chronic postprocedural pain: Secondary | ICD-10-CM | POA: Diagnosis not present

## 2016-10-16 DIAGNOSIS — I10 Essential (primary) hypertension: Secondary | ICD-10-CM | POA: Diagnosis not present

## 2016-11-13 DIAGNOSIS — F329 Major depressive disorder, single episode, unspecified: Secondary | ICD-10-CM | POA: Diagnosis not present

## 2016-11-13 DIAGNOSIS — G8921 Chronic pain due to trauma: Secondary | ICD-10-CM | POA: Diagnosis not present

## 2016-11-13 DIAGNOSIS — I10 Essential (primary) hypertension: Secondary | ICD-10-CM | POA: Diagnosis not present

## 2016-11-24 DIAGNOSIS — J449 Chronic obstructive pulmonary disease, unspecified: Secondary | ICD-10-CM | POA: Diagnosis not present

## 2016-12-31 ENCOUNTER — Institutional Professional Consult (permissible substitution): Payer: Medicare Other | Admitting: Internal Medicine

## 2017-01-01 ENCOUNTER — Encounter: Payer: Self-pay | Admitting: Internal Medicine

## 2017-01-01 ENCOUNTER — Ambulatory Visit (INDEPENDENT_AMBULATORY_CARE_PROVIDER_SITE_OTHER): Payer: Medicare Other | Admitting: Internal Medicine

## 2017-01-01 VITALS — BP 136/82 | HR 49 | Ht 66.0 in | Wt 133.0 lb

## 2017-01-01 DIAGNOSIS — F17210 Nicotine dependence, cigarettes, uncomplicated: Secondary | ICD-10-CM | POA: Diagnosis not present

## 2017-01-01 DIAGNOSIS — J449 Chronic obstructive pulmonary disease, unspecified: Secondary | ICD-10-CM

## 2017-01-01 MED ORDER — BUDESONIDE-FORMOTEROL FUMARATE 160-4.5 MCG/ACT IN AERO: 2.0000 | INHALATION_SPRAY | Freq: Two times a day (BID) | RESPIRATORY_TRACT | 12 refills | Status: DC

## 2017-01-01 NOTE — Patient Instructions (Addendum)
The key is to stop smoking completely before smoking completely stops you!   Work on inhaler technique:  relax and gently blow all the way out then take a nice smooth deep breath back in, triggering the inhaler at same time you start breathing in.  Hold for up to 5 seconds if you can. Blow out thru nose. Rinse and gargle with water when done   Plan A = Automatic = Symbicort 160 Take 2 puffs first thing in am and then another 2 puffs about 12 hours later.     Plan B = Backup Only use your albuterol as a rescue medication to be used if you can't catch your breath by resting or doing a relaxed purse lip breathing pattern.  - The less you use it, the better it will work when you need it. - Ok to use the inhaler up to 2 puffs  every 4 hours if you must but call for appointment if use goes up over your usual need - Don't leave home without it !!  (think of it like the spare tire for your car)   Plan C = Crisis - only use your albuterol nebulizer if you first try Plan B and it fails to help > ok to use the nebulizer up to every 4 hours but if start needing it regularly call for immediate appointment     Please schedule a follow up office visit in 4 weeks, sooner if needed  with all medications /inhalers/ solutions in hand so we can verify exactly what you are taking. This includes all medications from all doctors and over the counters Add:  Needs cxr on return

## 2017-01-01 NOTE — Progress Notes (Signed)
Subjective:    Patient ID: [name], male    DOB: 25-Jul-1946,    MRN: [id number]  HPI  52 yowm "cut way back on smoking" December 07 2016 with onset doe x 2015 on breo/ spiriva x 2017 referred to pulmonary clinic 01/01/2017 by Nunzio Cobbs Hegecock PA for copd and proved to have GOLD III critieria on initial pulmonary eval.   01/01/2017 1st [facility] office visit/ [name]  Breo/ spiriva dpi  Chief Complaint  Patient presents with  . Pulmonary Consult    Referred by [facility] PA-C, Pt here today c/o increase sob with exertion, finds it hard to catch his breath when he does any fast walking,pt does cough alot with phlegm, slight wheezing,some chest tightness pt still currently smokes,     Just started spiriva dpi  But ? Albuterol helps the most / cough is most productive n ams but nothing purulent produced  Doe = MMRC3 = can't walk 100 yards even at a slow pace at a flat grade s stopping due to sob  - can do HT leaning on cart    No obvious day to day or daytime variability or assoc mucus plugs or hemoptysis or cp or overt sinus or hb symptoms. No unusual exp hx or h/o childhood pna/ asthma or knowledge of premature birth.  Sleeping ok without nocturnal  or early am exacerbation  of respiratory  c/o's or need for noct saba. Also denies any obvious fluctuation of symptoms with weather or environmental changes or other aggravating or alleviating factors except as outlined above   Current Medications, Allergies, Complete Past Medical History, Past Surgical History, Family History, and Social History were reviewed in [employer] record.             Review of Systems  Constitutional: Negative.  Negative for fever and unexpected weight change.  HENT: Positive for congestion. Negative for dental problem, ear pain, nosebleeds, postnasal drip, rhinorrhea, sinus pressure, sneezing, sore throat and trouble swallowing.   Eyes: Negative.  Negative for redness and  itching.  Respiratory: Positive for cough, chest tightness, shortness of breath and wheezing.   Cardiovascular: Negative.  Negative for palpitations and leg swelling.  Gastrointestinal: Negative.  Negative for nausea and vomiting.  Endocrine: Negative.   Genitourinary: Negative.  Negative for dysuria.  Musculoskeletal: Negative.  Negative for joint swelling.  Skin: Negative.  Negative for rash.  Allergic/Immunologic: Negative.  Negative for environmental allergies, food allergies and immunocompromised state.  Neurological: Negative.  Negative for headaches.  Hematological: Bruises/bleeds easily.  Psychiatric/Behavioral: Negative.  Negative for dysphoric mood. The patient is not nervous/anxious.        Objective:   Physical Exam  amb wm with congested/ rattling cough    Wt Readings from Last 3 Encounters:  01/01/17 133 lb (60.3 kg)  03/04/16 120 lb 9.6 oz (54.7 kg)  02/14/16 123 lb (55.8 kg)    Vital signs reviewed  - Note on arrival 02 sats  92% on RA     HEENT: nl dentition, turbinates bilaterally, and oropharynx. Nl external ear canals without cough reflex   NECK :  without JVD/Nodes/TM/ nl carotid upstrokes bilaterally   LUNGS: no acc muscle use,  Barrel contour chest / hyper resonant to percussion with insp and exp rhonchi bilaterally    CV:  RRR  no s3 or murmur or increase in P2, and no edema   ABD:  soft and nontender with nl inspiratory excursion in the supine position. No  bruits or organomegaly appreciated, bowel sounds nl  MS:  Nl gait/ ext warm without deformities, calf tenderness, cyanosis or clubbing No obvious joint restrictions   SKIN: warm and dry without lesions    NEURO:  alert, approp, nl sensorium with  no motor or cerebellar deficits apparent.       I personally reviewed images and agree with radiology impression as follows:  No recent available        Assessment & Plan:

## 2017-01-02 NOTE — Assessment & Plan Note (Signed)
Severe copd with symptoms poorly controlled and excess reliance on saba  DDX of  difficult airways management almost all start with A and  include Adherence, Ace Inhibitors, Acid Reflux, Active Sinus Disease, Alpha 1 Antitripsin deficiency, Anxiety masquerading as Airways dz,  ABPA,  Allergy(esp in young), Aspiration (esp in elderly), Adverse effects of meds,  Active smokers, A bunch of PE's (a small clot burden can't cause this syndrome unless there is already severe underlying pulm or vascular dz with poor reserve) plus two Bs  = Bronchiectasis and Beta blocker use..and one C= CHF  Adherence is always the initial "prime suspect" and is a multilayered concern that requires a "trust but verify" approach in every patient - starting with knowing how to use medications, especially inhalers, correctly, keeping up with refills and understanding the fundamental difference between maintenance and prns vs those medications only taken for a very short course and then stopped and not refilled.  - - The proper method of use, as well as anticipated side effects, of a metered-dose inhaler are discussed and demonstrated to the patient. Improved effectiveness after extensive coaching during this visit to a level of approximately 90 % from a baseline of 75  % so try symb 160 2bid    Active smoking (see separate a/p)   ? Adverse effects of meds  Esp DPI (try off ) and ACEi (hold any change for now but always concerned the side effects of both dpi's and acei overlap with copd and even to the trained pulmonary specialist with years of experience (eg me) can be indistinguishable.  Amiodarone another concern as well but not as problematic in absence of conspicuous  ILD (needs cxr to confirm)    ? A bunch of PE's > pt on coumadin  ? chf > note last echo 01/08/16:  Left ventricle: The cavity size was normal. There was moderate   concentric hypertrophy. Systolic function was moderately reduced.   The estimated ejection  fraction was in the range of 35% to 40%.   Diffuse hypokinesis. Hypokinesis is worse in the inferior and   inferolateral myocardium. Doppler parameters are consistent with   inderminate left ventricular relaxation (grade 1 diastolic   dysfunction).   For now will focus on smoking cessation/ optimal usie of inhalers and reduction on saba depedency  Total time devoted to counseling  > 50 % of initial 60 min office visit:  review case with pt/ discussion of options/alternatives/ personally creating written customized instructions  in presence of pt  then going over those specific  Instructions directly with the pt including how to use all of the meds but in particular covering each new medication in detail and the difference between the maintenance= "automatic" meds and the prns using an action plan format for the latter (If this problem/symptom => do that organization reading Left to right).  Please see AVS from this visit for a full list of these instructions which I personally wrote for this pt and  are unique to this visit.

## 2017-01-02 NOTE — Assessment & Plan Note (Signed)

## 2017-01-12 DIAGNOSIS — J449 Chronic obstructive pulmonary disease, unspecified: Secondary | ICD-10-CM | POA: Diagnosis not present

## 2017-01-12 DIAGNOSIS — G8928 Other chronic postprocedural pain: Secondary | ICD-10-CM | POA: Diagnosis not present

## 2017-01-12 DIAGNOSIS — M961 Postlaminectomy syndrome, not elsewhere classified: Secondary | ICD-10-CM | POA: Diagnosis not present

## 2017-01-12 DIAGNOSIS — I10 Essential (primary) hypertension: Secondary | ICD-10-CM | POA: Diagnosis not present

## 2017-01-12 DIAGNOSIS — K5903 Drug induced constipation: Secondary | ICD-10-CM | POA: Diagnosis not present

## 2017-01-12 DIAGNOSIS — G8921 Chronic pain due to trauma: Secondary | ICD-10-CM | POA: Diagnosis not present

## 2017-01-12 DIAGNOSIS — T402X5A Adverse effect of other opioids, initial encounter: Secondary | ICD-10-CM | POA: Diagnosis not present

## 2017-01-19 ENCOUNTER — Encounter: Payer: Self-pay | Admitting: Vascular Surgery

## 2017-01-19 NOTE — Progress Notes (Deleted)
Established Open AAA Repair   History of Present Illness   [name] is a 71 y.o. (01/31/1946) male who presents for routine follow up s/p failed EVAR leading to conversion into ABF (Date: 01/03/16).  Most recent aortoiliac duplex (Date: ***) demonstrates: *** endoleak and *** sac size.  The patient has *** had back or abdominal pain.  The patient's PMH, PSH, and SH, and FamHx are unchanged from 02/14/16.   Current Outpatient Prescriptions  Medication Sig Dispense Refill  . albuterol (PROAIR HFA) 108 (90 Base) MCG/ACT inhaler Inhale 1-2 puffs into the lungs every 6 (six) hours as needed for wheezing or shortness of breath.     [name] amiodarone (PACERONE) 200 MG tablet Take 1 tablet (200 mg total) by mouth daily. 30 tablet 11  . aspirin EC 81 MG tablet Take 81 mg by mouth daily.    [name] atorvastatin (LIPITOR) 80 MG tablet Take 1 tablet (80 mg total) by mouth daily at 6 PM. 30 tablet 5  . BREO ELLIPTA 100-25 MCG/INH AEPB Inhale 1 puff into the lungs daily.  1  . budesonide-formoterol (SYMBICORT) 160-4.5 MCG/ACT inhaler Inhale 2 puffs into the lungs 2 (two) times daily. 1 Inhaler 12  . escitalopram (LEXAPRO) 20 MG tablet Take 20 mg by mouth daily.    [name] lisinopril (PRINIVIL,ZESTRIL) 10 MG tablet Take 10 mg by mouth daily.    . metoprolol tartrate (LOPRESSOR) 25 MG tablet Take 1 tablet (25 mg total) by mouth 2 (two) times daily. 60 tablet 11  . mirtazapine (REMERON SOL-TAB) 30 MG disintegrating tablet Take 30 mg by mouth at bedtime.     . mometasone (NASONEX) 50 MCG/ACT nasal spray Place 2 sprays into the nose daily as needed (for sinus).     . nitroGLYCERIN (NITROSTAT) 0.4 MG SL tablet Place 0.4 mg under the tongue every 5 (five) minutes as needed for chest pain.     [name] oxyCODONE (OXY IR/ROXICODONE) 5 MG immediate release tablet Take 5 mg by mouth 2 (two) times daily as needed.    [name] oxyCODONE-acetaminophen (PERCOCET) 10-325 MG tablet Take 1 tablet by mouth every 6 (six) hours as needed for  pain. 10 tablet 0  . tiotropium (SPIRIVA) 18 MCG inhalation capsule Place 18 mcg into inhaler and inhale daily.    [name] tiZANidine (ZANAFLEX) 4 MG tablet Take 4-8 mg by mouth 2 (two) times daily. PT TAKES 2 TABLETS IN THE MORNING, 1 TABLET AT BEDTIME    . warfarin (COUMADIN) 2.5 MG tablet Take 1 tablet (2.5 mg total) by mouth daily. 30 tablet 2   No current facility-administered medications for this visit.     Allergies  Allergen Reactions  . Aspirin Nausea Only and Other (See Comments)    Stomach upset  . Ibuprofen Hives    On ROS today: ***, ***.   ***Physical Examination   There were no vitals filed for this visit.  There is no height or weight on file to calculate BMI.  General {LOC:19197::"Somulent","Alert"}, {Orientation:19197::"Confused","O x 3"}, {Weight:19197::"Obese","Cachectic","WD"}, {General state of health:19197::"Ill appearing","NAD"}  Pulmonary {Chest wall:19197::"Asx chest movement","Sym exp"}, {Air movt:19197::"Decreased *** air movt","good B air movt"}, {BS:19197::"rales on ***","rhonchi on ***","wheezing on ***","CTA B"}  Cardiac {Rhythm:19197::"Irregularly, irregular rate and rhythm","RRR, Nl S1, S2"}, {Murmur:19197::"Murmur present: ***","no Murmurs"}, {Rubs:19197::"Rub present: ***","No rubs"}, {Gallop:19197::"Gallop present: ***","No S3,S4"}  Vascular Vessel Right Left  Radial {Palpable:19197::"Not palpable","Faintly palpable","Palpable"} {Palpable:19197::"Not palpable","Faintly palpable","Palpable"}  Brachial {Palpable:19197::"Not palpable","Faintly palpable","Palpable"} {Palpable:19197::"Not palpable","Faintly palpable","Palpable"}  Carotid Palpable, {Bruit:19197::"Bruit present","No Bruit"} Palpable, {Bruit:19197::"Bruit present","No Bruit"}  Aorta Not palpable N/A  Femoral {Palpable:19197::"Not palpable","Faintly palpable","Palpable"} {Palpable:19197::"Not palpable","Faintly palpable","Palpable"}  Popliteal {Palpable:19197::"Prominently palpable","Not  palpable"} {Palpable:19197::"Prominently palpable","Not palpable"}  PT {Palpable:19197::"Not palpable","Faintly palpable","Palpable"} {Palpable:19197::"Not palpable","Faintly palpable","Palpable"}  DP {Palpable:19197::"Not palpable","Faintly palpable","Palpable"} {Palpable:19197::"Not palpable","Faintly palpable","Palpable"}    Gastrointestinal soft, {Distension:19197::"distended","non-distended"}, {TTP:19197::"TTP in *** quadrant","appropriate tenderness to palpation","non-tender to palpation"}, {Guarding:19197::"Guarding and rebound in *** quadrant","No guarding or rebound"}, {HSM:19197::"HSM present","no HSM"}, {Masses:19197::"Mass present: ***","no masses"}, {Flank:19197::"CVAT on ***","Flank bruit present on ***","no CVAT B"}, {AAA:19197::"Palpable prominent aortic pulse","No palpable prominent aortic pulse"}, {Surgical incision:19197::"Surg incisions: ***","Surgical incisions well healed"," "}  Musculoskeletal M/S 5/5 throughout {MS:19197::"except ***"," "}, Extremities without ischemic changes {MS:19197::"except ***"," "}, {Edema:19197::"Edema present: ***","No edema present"},  Neurologic Pain and light touch intact in extremities{CN:19197::" except for decreased sensation in ***"," "}, Motor exam as listed above    Non-Invasive Vascular Imaging   Aortoiliac Duplex (Date: ***)  AAA sac size: *** cm x *** cm  *** endoleak detected   Medical Decision Making   [name] is a 71 y.o. male who presents s/p failed EVAR converted to ABF.     I discussed with the patient the importance of surveillance of the endograft.  The next aortoiliac duplex will be scheduled for *** months.  The patient will follow up with [place] in *** months with these studies.  Thank you for allowing [place] to participate in this patient's care.   Adele Barthel, MD, FACS [facility] Office: [phone] Pager: [phone]

## 2017-01-23 ENCOUNTER — Encounter (HOSPITAL_COMMUNITY): Payer: Medicare Other

## 2017-01-23 ENCOUNTER — Ambulatory Visit: Payer: Medicare Other | Admitting: Vascular Surgery

## 2017-01-28 ENCOUNTER — Encounter: Payer: Medicare Other | Admitting: Internal Medicine

## 2017-01-30 ENCOUNTER — Encounter (HOSPITAL_COMMUNITY): Payer: Medicare Other

## 2017-01-30 ENCOUNTER — Ambulatory Visit: Payer: Medicare Other | Admitting: Vascular Surgery

## 2017-02-09 DIAGNOSIS — G8928 Other chronic postprocedural pain: Secondary | ICD-10-CM | POA: Diagnosis not present

## 2017-02-27 ENCOUNTER — Encounter: Payer: Medicare Other | Admitting: Internal Medicine

## 2017-03-09 ENCOUNTER — Inpatient Hospital Stay (HOSPITAL_COMMUNITY)
Admission: EM | Admit: 2017-03-09 | Discharge: 2017-03-17 | DRG: 208 | Disposition: A | Discharge status: Home / Self Care | Payer: Medicare Other | Attending: Internal Medicine | Admitting: Internal Medicine

## 2017-03-09 ENCOUNTER — Emergency Department (HOSPITAL_COMMUNITY): Payer: Medicare Other

## 2017-03-09 DIAGNOSIS — D638 Anemia in other chronic diseases classified elsewhere: Secondary | ICD-10-CM | POA: Diagnosis present

## 2017-03-09 DIAGNOSIS — Z955 Presence of coronary angioplasty implant and graft: Secondary | ICD-10-CM

## 2017-03-09 DIAGNOSIS — Z79899 Other long term (current) drug therapy: Secondary | ICD-10-CM

## 2017-03-09 DIAGNOSIS — I255 Ischemic cardiomyopathy: Secondary | ICD-10-CM | POA: Diagnosis present

## 2017-03-09 DIAGNOSIS — I110 Hypertensive heart disease with heart failure: Secondary | ICD-10-CM | POA: Diagnosis present

## 2017-03-09 DIAGNOSIS — E43 Unspecified severe protein-calorie malnutrition: Secondary | ICD-10-CM | POA: Diagnosis present

## 2017-03-09 DIAGNOSIS — Z7901 Long term (current) use of anticoagulants: Secondary | ICD-10-CM

## 2017-03-09 DIAGNOSIS — E785 Hyperlipidemia, unspecified: Secondary | ICD-10-CM | POA: Diagnosis present

## 2017-03-09 DIAGNOSIS — J690 Pneumonitis due to inhalation of food and vomit: Principal | ICD-10-CM | POA: Diagnosis present

## 2017-03-09 DIAGNOSIS — T380X5A Adverse effect of glucocorticoids and synthetic analogues, initial encounter: Secondary | ICD-10-CM | POA: Diagnosis present

## 2017-03-09 DIAGNOSIS — R7881 Bacteremia: Secondary | ICD-10-CM | POA: Diagnosis present

## 2017-03-09 DIAGNOSIS — B957 Other staphylococcus as the cause of diseases classified elsewhere: Secondary | ICD-10-CM | POA: Diagnosis present

## 2017-03-09 DIAGNOSIS — J441 Chronic obstructive pulmonary disease with (acute) exacerbation: Secondary | ICD-10-CM | POA: Diagnosis not present

## 2017-03-09 DIAGNOSIS — J410 Simple chronic bronchitis: Secondary | ICD-10-CM

## 2017-03-09 DIAGNOSIS — R0600 Dyspnea, unspecified: Secondary | ICD-10-CM

## 2017-03-09 DIAGNOSIS — R680 Hypothermia, not associated with low environmental temperature: Secondary | ICD-10-CM | POA: Diagnosis present

## 2017-03-09 DIAGNOSIS — I480 Paroxysmal atrial fibrillation: Secondary | ICD-10-CM | POA: Diagnosis present

## 2017-03-09 DIAGNOSIS — Z4682 Encounter for fitting and adjustment of non-vascular catheter: Secondary | ICD-10-CM | POA: Diagnosis not present

## 2017-03-09 DIAGNOSIS — I2510 Atherosclerotic heart disease of native coronary artery without angina pectoris: Secondary | ICD-10-CM | POA: Diagnosis present

## 2017-03-09 DIAGNOSIS — I248 Other forms of acute ischemic heart disease: Secondary | ICD-10-CM | POA: Diagnosis present

## 2017-03-09 DIAGNOSIS — I252 Old myocardial infarction: Secondary | ICD-10-CM

## 2017-03-09 DIAGNOSIS — F10239 Alcohol dependence with withdrawal, unspecified: Secondary | ICD-10-CM | POA: Diagnosis present

## 2017-03-09 DIAGNOSIS — D649 Anemia, unspecified: Secondary | ICD-10-CM | POA: Diagnosis present

## 2017-03-09 DIAGNOSIS — R7989 Other specified abnormal findings of blood chemistry: Secondary | ICD-10-CM | POA: Diagnosis present

## 2017-03-09 DIAGNOSIS — L89151 Pressure ulcer of sacral region, stage 1: Secondary | ICD-10-CM | POA: Diagnosis present

## 2017-03-09 DIAGNOSIS — I447 Left bundle-branch block, unspecified: Secondary | ICD-10-CM | POA: Diagnosis present

## 2017-03-09 DIAGNOSIS — J9600 Acute respiratory failure, unspecified whether with hypoxia or hypercapnia: Secondary | ICD-10-CM | POA: Diagnosis not present

## 2017-03-09 DIAGNOSIS — R778 Other specified abnormalities of plasma proteins: Secondary | ICD-10-CM | POA: Diagnosis present

## 2017-03-09 DIAGNOSIS — I214 Non-ST elevation (NSTEMI) myocardial infarction: Secondary | ICD-10-CM

## 2017-03-09 DIAGNOSIS — I340 Nonrheumatic mitral (valve) insufficiency: Secondary | ICD-10-CM | POA: Diagnosis present

## 2017-03-09 DIAGNOSIS — F17210 Nicotine dependence, cigarettes, uncomplicated: Secondary | ICD-10-CM | POA: Diagnosis present

## 2017-03-09 DIAGNOSIS — Y909 Presence of alcohol in blood, level not specified: Secondary | ICD-10-CM | POA: Diagnosis present

## 2017-03-09 DIAGNOSIS — J449 Chronic obstructive pulmonary disease, unspecified: Secondary | ICD-10-CM | POA: Diagnosis present

## 2017-03-09 DIAGNOSIS — J9601 Acute respiratory failure with hypoxia: Secondary | ICD-10-CM

## 2017-03-09 DIAGNOSIS — R739 Hyperglycemia, unspecified: Secondary | ICD-10-CM | POA: Diagnosis present

## 2017-03-09 DIAGNOSIS — I2489 Other forms of acute ischemic heart disease: Secondary | ICD-10-CM | POA: Diagnosis present

## 2017-03-09 DIAGNOSIS — L8990 Pressure ulcer of unspecified site, unspecified stage: Secondary | ICD-10-CM | POA: Diagnosis present

## 2017-03-09 DIAGNOSIS — G92 Toxic encephalopathy: Secondary | ICD-10-CM | POA: Diagnosis present

## 2017-03-09 DIAGNOSIS — I5022 Chronic systolic (congestive) heart failure: Secondary | ICD-10-CM | POA: Diagnosis present

## 2017-03-09 DIAGNOSIS — I481 Persistent atrial fibrillation: Secondary | ICD-10-CM | POA: Diagnosis present

## 2017-03-09 DIAGNOSIS — G8929 Other chronic pain: Secondary | ICD-10-CM | POA: Diagnosis present

## 2017-03-09 DIAGNOSIS — I959 Hypotension, unspecified: Secondary | ICD-10-CM | POA: Diagnosis present

## 2017-03-09 DIAGNOSIS — R490 Dysphonia: Secondary | ICD-10-CM | POA: Diagnosis not present

## 2017-03-09 DIAGNOSIS — J9602 Acute respiratory failure with hypercapnia: Secondary | ICD-10-CM | POA: Diagnosis present

## 2017-03-09 DIAGNOSIS — Z6823 Body mass index (BMI) 23.0-23.9, adult: Secondary | ICD-10-CM

## 2017-03-09 DIAGNOSIS — I517 Cardiomegaly: Secondary | ICD-10-CM | POA: Diagnosis not present

## 2017-03-09 HISTORY — DX: Heart failure, unspecified: I50.9

## 2017-03-09 HISTORY — DX: Abdominal aortic aneurysm, without rupture: I71.4

## 2017-03-09 HISTORY — DX: Dissection of renal artery: I77.73

## 2017-03-09 HISTORY — DX: Abdominal aortic aneurysm, without rupture, unspecified: I71.40

## 2017-03-09 HISTORY — DX: Acute respiratory failure, unspecified whether with hypoxia or hypercapnia: J96.00

## 2017-03-09 LAB — CBC
HCT: 33.6 % — ABNORMAL LOW (ref 39.0–52.0)
HEMOGLOBIN: 9.6 g/dL — AB (ref 13.0–17.0)
MCH: 24.6 pg — AB (ref 26.0–34.0)
MCHC: 28.6 g/dL — AB (ref 30.0–36.0)
MCV: 85.9 fL (ref 78.0–100.0)
PLATELETS: 321 10*3/uL (ref 150–400)
RBC: 3.91 MIL/uL — AB (ref 4.22–5.81)
RDW: 16.3 % — ABNORMAL HIGH (ref 11.5–15.5)
WBC: 11.1 10*3/uL — ABNORMAL HIGH (ref 4.0–10.5)

## 2017-03-09 LAB — LACTIC ACID, PLASMA: LACTIC ACID, VENOUS: 1.9 mmol/L (ref 0.5–1.9)

## 2017-03-09 MED ORDER — PROPOFOL 1000 MG/100ML IV EMUL
5.0000 ug/kg/min | Freq: Once | INTRAVENOUS | Status: DC
  Administered 2017-03-09: 60 ug/kg/min via INTRAVENOUS

## 2017-03-09 MED ORDER — DEXTROSE 5 % IV SOLN
1.0000 g | Freq: Once | INTRAVENOUS | Status: AC
  Administered 2017-03-09: 1 g via INTRAVENOUS
  Filled 2017-03-09: qty 10

## 2017-03-09 MED ORDER — PROPOFOL 1000 MG/100ML IV EMUL
INTRAVENOUS | Status: AC
  Filled 2017-03-09: qty 100

## 2017-03-09 MED ORDER — ETOMIDATE 2 MG/ML IV SOLN
INTRAVENOUS | Status: AC | PRN
  Administered 2017-03-09: 20 mg via INTRAVENOUS

## 2017-03-09 MED ORDER — DEXTROSE 5 % IV SOLN
500.0000 mg | Freq: Once | INTRAVENOUS | Status: AC
  Administered 2017-03-10: 500 mg via INTRAVENOUS
  Filled 2017-03-09: qty 500

## 2017-03-09 MED ORDER — SUCCINYLCHOLINE CHLORIDE 20 MG/ML IJ SOLN
INTRAMUSCULAR | Status: AC | PRN
  Administered 2017-03-09: 100 mg via INTRAVENOUS

## 2017-03-09 MED ORDER — IPRATROPIUM BROMIDE 0.02 % IN SOLN
0.5000 mg | Freq: Once | RESPIRATORY_TRACT | Status: DC
  Filled 2017-03-09: qty 2.5

## 2017-03-09 MED ORDER — ALBUTEROL SULFATE (2.5 MG/3ML) 0.083% IN NEBU
5.0000 mg | INHALATION_SOLUTION | Freq: Once | RESPIRATORY_TRACT | Status: DC
  Filled 2017-03-09: qty 6

## 2017-03-09 MED ORDER — CLINDAMYCIN PHOSPHATE 600 MG/50ML IV SOLN
600.0000 mg | Freq: Once | INTRAVENOUS | Status: AC
  Administered 2017-03-09: 600 mg via INTRAVENOUS
  Filled 2017-03-09: qty 50

## 2017-03-09 NOTE — ED Provider Notes (Signed)
[place] DEPT Provider Note   CSN: [id number] Arrival date & time: 03/09/17  2249     History   Chief Complaint No chief complaint on file.  Level V caveat: Respiratory distress  HPI [name] is a 71 y.o. male.  The history is provided by the EMS personnel.   Patient is a 71 year old male with a reported history of CHF and COPD.  EMS was called to the house for shortness of breath.  It's reported that he quickly decompensated in route.  On presentation the pt is unresponsive with bag mask ventilations.  No additional information available.   No past medical history on file.  There are no active problems to display for this patient.   No past surgical history on file.     Home Medications    Prior to Admission medications   Not on File    Family History No family history on file.  Social History Social History  Substance Use Topics  . Smoking status: Not on file  . Smokeless tobacco: Not on file  . Alcohol use Not on file     Allergies   Patient has no allergy information on record.   Review of Systems Review of Systems  Unable to perform ROS: Severe respiratory distress     Physical Exam Updated Vital Signs Pulse (!) 110   Temp (!) 96.9 F (36.1 C) (Temporal)   Resp 16 Comment: assisted with BVM  Ht 5\' 5"  (1.651 m)   Wt 60 kg (132 lb 4.4 oz)   SpO2 100%   BMI 22.01 kg/m   Physical Exam  Constitutional: He appears distressed.  HENT:  Head: Normocephalic and atraumatic.  Cardiovascular:  Tachycardic  Pulmonary/Chest: No respiratory distress.  Accessory muscle use.  Rales bilaterally.  Mild wheezes.  Tachypnea  Abdominal: Soft. He exhibits no distension.  Musculoskeletal: He exhibits no edema.  Neurological:  Able to squeeze hand.  Skin: Skin is warm. He is diaphoretic.  Nursing note and vitals reviewed.    ED Treatments / Results  Labs (all labs ordered are listed, but only abnormal results are displayed) Labs  Reviewed  CULTURE, BLOOD (ROUTINE X 2)  CULTURE, BLOOD (ROUTINE X 2)  CBC  COMPREHENSIVE METABOLIC PANEL  TROPONIN I  LACTIC ACID, PLASMA  I-STAT ARTERIAL BLOOD GAS, ED    EKG  EKG Interpretation  Date/Time:  Monday March 09 2017 22:57:44 EDT Ventricular Rate:  96 PR Interval:    QRS Duration: 115 QT Interval:  356 QTC Calculation: 450 R Axis:   12 Text Interpretation:  Sinus rhythm Prolonged PR interval Incomplete left bundle branch block ST depression, consider ischemia, lateral lds Minimal ST elevation, lateral leads No old tracing to compare Confirmed by Jola Schmidt [phone]) on 03/09/2017 11:36:49 PM       Radiology Dg Chest Portable 1 View  Result Date: 03/09/2017 CLINICAL DATA:  Endotracheal tube placement and orogastric tube placement. Initial encounter. EXAM: PORTABLE CHEST 1 VIEW COMPARISON:  None. FINDINGS: The patient's endotracheal tube is seen ending 6 cm above the carina. An enteric tube is noted extending below the diaphragm. Right midlung airspace opacity may reflect pneumonia. Underlying interstitial prominence and bilateral fibrotic change are noted. No definite pleural effusion or pneumothorax is seen. The cardiomediastinal silhouette is mildly enlarged. No acute osseous abnormalities are identified. IMPRESSION: 1. Endotracheal tube seen ending 6 cm above the carina. Enteric tube noted extending below the diaphragm. 2. Right midlung airspace opacity raises concern for pneumonia. 3. Underlying interstitial prominence  and bilateral fibrosis noted. 4. Mild cardiomegaly. Electronically Signed   By: Garald Balding M.D.   On: 03/09/2017 23:08     Procedures .Critical Care Performed by: Jola Schmidt Authorized by: Jola Schmidt    Total critical care time: 35 minutes Critical care time was exclusive of separately billable procedures and treating other patients. Critical care was necessary to treat or prevent imminent or life-threatening deterioration. Critical care  was time spent personally by me on the following activities: development of treatment plan with patient and/or surrogate as well as nursing, discussions with consultants, evaluation of patient's response to treatment, examination of patient, obtaining history from patient or surrogate, ordering and performing treatments and interventions, ordering and review of laboratory studies, ordering and review of radiographic studies, pulse oximetry and re-evaluation of patient's condition.   INTUBATION Performed by: Hoy Morn Required items: required blood products, implants, devices, and special equipment available Patient identity confirmed: provided demographic data and hospital-assigned identification number Time out: Immediately prior to procedure a "time out" was called to verify the correct patient, procedure, equipment, support staff and site/side marked as required. Indications: Acute respiratory failure  Intubation method: Glidescope Laryngoscopy  Preoxygenation: BVM Sedatives: Etomidate Paralytic: Succinylcholine Tube Size: 7.5 cuffed Post-procedure assessment: chest rise and ETCO2 monitor Breath sounds: equal and absent over the epigastrium Tube secured with: ETT holder Chest x-ray interpreted by radiologist and me. Chest x-ray findings: endotracheal tube in appropriate position Patient tolerated the procedure well with no immediate complications.   +++++++++++++++++++++++++++++++++++++++++++++++++++++++  Medications Ordered in ED Medications  propofol (DIPRIVAN) 1000 MG/100ML infusion (not administered)  cefTRIAXone (ROCEPHIN) 1 g in dextrose 5 % 50 mL IVPB (not administered)  azithromycin (ZITHROMAX) 500 mg in dextrose 5 % 250 mL IVPB (not administered)  clindamycin (CLEOCIN) IVPB 600 mg (not administered)  albuterol (PROVENTIL) (2.5 MG/3ML) 0.083% nebulizer solution 5 mg (not administered)  ipratropium (ATROVENT) nebulizer solution 0.5 mg (not administered)  etomidate  (AMIDATE) injection (20 mg Intravenous Given 03/09/17 2251)  succinylcholine (ANECTINE) injection (100 mg Intravenous Given 03/09/17 2252)     Initial Impression / Assessment and Plan / ED Course  I have reviewed the triage vital signs and the nursing notes.  Pertinent labs & imaging results that were available during my care of the patient were reviewed by me and considered in my medical decision making (see chart for details).     No significant history available for the patient.  No family or friends if shown up.  All the information I have is from EMS.  Respiratory distress and acute respiratory failure on arrival.  Intubated for airway protection.  Patient had vomiting in route therefore I do not think is a good candidate for noninvasive management of his respiratory failure.  Right-sided infiltrate noted.  Patient be covered for community acquired pneumonia.  With his vomiting in route there is a question of aspiration.  IV clinical be added.   Final Clinical Impressions(s) / ED Diagnoses   Final diagnoses:  Acute respiratory failure, unspecified whether with hypoxia or hypercapnia ([place])  Chronic obstructive pulmonary disease, unspecified COPD type ([place])    New Prescriptions New Prescriptions   No medications on file     Jola Schmidt, MD 03/09/17 2340

## 2017-03-09 NOTE — H&P (Signed)
PULMONARY / CRITICAL CARE MEDICINE   Name: [name] MRN: [id number] DOB: Mar 22, 1946    ADMISSION DATE:  03/09/2017 CONSULTATION DATE:  03/09/17  REFERRING MD:  Dr. Venora Maples  CHIEF COMPLAINT:  Acute respiratory failure  HISTORY OF PRESENT ILLNESS:  Patient is currently intubated and sedated and therefore HPI obtained from medical chart review.    71 year old male with unknown past medical history who presented to [facility] by EMS for acute dyspnea.    Patient has a questionable history of COPD and heart failure however this can not be confirmed.  No home medications available.    Patient had no family or friends in the ED.  Information obtained prior to ER was from EMS.  Patient reported sudden onset of dyspnea around 2100 on 7/2.  During transport, his breathing rapidly declined requiring bag mask ventilation.  Patient vomited several times in route.  On arrival to ER, patient had gasping respirations and was intubated.  Patient was following commands and had purposeful movement prior to intubation.  Chest xray shows right middle lobe opacity.  EKG with incomplete left bundle block.  Labs show a Na 136, K 4.7, CO2 25, glucose 297, BUN 16, sCr 1.06, lactic acid 1.9, troponin 0.08, WBC 11.1, Hgb 9.6, Plts 321.  Treated with ceftraizone, azithromycin, and clindamycin.  Sedated with propofol.  PCCM to admit for acute respiratory failure.    ADDENDUM: patient has another chart to be merged under MRN [id number].  He has an extensive PMH of CAD, MI, HLD, HTN, PVD, AAA s/p repair, hernia repair, CVA, PAF ? On coumadin, ischemic cardiomyopathy with EF 35-40% on 5/17, COPD, current smoker, and chronic pain related to neck and abdominal surgeries.  PAST MEDICAL HISTORY :  He  has no past medical history on file.  PAST SURGICAL HISTORY: He  has no past surgical history on file.  No Known Allergies  No current facility-administered medications on file prior to encounter.    No current outpatient  prescriptions on file prior to encounter.    FAMILY HISTORY:  His has no family status information on file.    SOCIAL HISTORY: Unknown  REVIEW OF SYSTEMS:  Unable to assess as patient is intubated and sedated.  SUBJECTIVE:   VITAL SIGNS: BP (!) 102/59   Pulse 70   Temp (!) 95.7 F (35.4 C) (Rectal)   Resp 15   Ht 5\' 5"  (1.651 m)   Wt 132 lb 4.4 oz (60 kg)   SpO2 100%   BMI 22.01 kg/m   HEMODYNAMICS:    VENTILATOR SETTINGS: Vent Mode: PRVC FiO2 (%):  [50 %-60 %] 50 % Set Rate:  [15 bmp-20 bmp] 20 bmp Vt Set:  [550 mL] 550 mL PEEP:  [5 cmH20] 5 cmH20 Plateau Pressure:  [22 cmH20] 22 cmH20  INTAKE / OUTPUT: No intake/output data recorded.  PHYSICAL EXAMINATION: General:  Adult male lying on ER stretcher in NAD HEENT: [place]/AT, MM pink/moist, no dentition, no JVD, Pupils 3/= Neuro: Sedated, does not follow commands, MAE in response to pain  CV: SR 70's, s1s2 rrr, 2/6 murmur at LSB PULM: even/non-labored on MV, lungs bilaterally rhonchi, improved with suctioning-thin/white secretions, residual rhonchi in RML GI: soft, non-tender, bsx4 active  Extremities: warm/dry, no edema  Skin: no rashes or lesions  LABS:  BMET  Recent Labs Lab 03/09/17 2258  NA 136  K 4.7  CL 103  CO2 25  BUN 16  CREATININE 1.06  GLUCOSE 297*    Electrolytes  Recent  Labs Lab 03/09/17 2258  CALCIUM 8.6*    CBC  Recent Labs Lab 03/09/17 2258  WBC 11.1*  HGB 9.6*  HCT 33.6*  PLT 321    Coag's No results for input(s): APTT, INR in the last 168 hours.  Sepsis Markers  Recent Labs Lab 03/09/17 2315  LATICACIDVEN 1.9    ABG  Recent Labs Lab 03/10/17 0020  PHART 7.186*  PCO2ART 71.1*  PO2ART 138.0*    Liver Enzymes  Recent Labs Lab 03/09/17 2258  AST 24  ALT 14*  ALKPHOS 88  BILITOT 0.4  ALBUMIN 3.2*    Cardiac Enzymes  Recent Labs Lab 03/09/17 2258  TROPONINI 0.08*    Glucose No results for input(s): GLUCAP in the last 168  hours.  Imaging Dg Chest Portable 1 View  Result Date: 03/09/2017 CLINICAL DATA:  Endotracheal tube placement and orogastric tube placement. Initial encounter. EXAM: PORTABLE CHEST 1 VIEW COMPARISON:  None. FINDINGS: The patient's endotracheal tube is seen ending 6 cm above the carina. An enteric tube is noted extending below the diaphragm. Right midlung airspace opacity may reflect pneumonia. Underlying interstitial prominence and bilateral fibrotic change are noted. No definite pleural effusion or pneumothorax is seen. The cardiomediastinal silhouette is mildly enlarged. No acute osseous abnormalities are identified. IMPRESSION: 1. Endotracheal tube seen ending 6 cm above the carina. Enteric tube noted extending below the diaphragm. 2. Right midlung airspace opacity raises concern for pneumonia. 3. Underlying interstitial prominence and bilateral fibrosis noted. 4. Mild cardiomegaly. Electronically Signed   By: Garald Balding M.D.   On: 03/09/2017 23:08     STUDIES:  CXR 7/2 >> ETT 6 cm above carina, enteric tube below diaphragm; RML opacity concerning for pneumonia, underlying interstitial prominence and bilateral fibrosis; mild cardiomegaly EKG 7/2 >> SR 76, incomplete LBBB, STD  CULTURES: BCx2 7/2 >> Tracheal aspirate 7/2 >>  ANTIBIOTICS: Ceftriaxone 7/2 >> azithromcyin 7/2 >> Clindamycin 7/2 >>7/2  SIGNIFICANT EVENTS: 7/2  Admit  LINES/TUBES: ETT 7/2 >> OGT 7/2 >> Foley 7/3 >> PIV x 3   DISCUSSION: 35 yoM with PMH ?of COPD and HF who presented with acute dyspnea requiring intubation.    ASSESSMENT / PLAN:  PULMONARY A: Acute respiratory failure  RML opacity  ? Pulmonary fibrosis Hx  COPD- no wheezing, doubt acute COPD exacerbation, tobacco abuse P:   PRVC 8 cc/kg Wean FiO2/ PEEP for sats > 92% CTA chest now to r/o PE  ABG noted, increase RR from 15 to 20 Trend CXR/ ABG Brovonna and Pulmicort BID Duonebs q 4 hr prn abx as below Sputum cx now  SBT when  stable  CARDIOVASCULAR A:  Elevated troponin 0.08 - demand vs r/o ACS Hypotension- sedated related, initially normotensive, normal lactate Hx systolic HF, HLD, HTN, ischemic cardiomyopathy with EF 35-40% 5/18, s/p AAA repair, PAF ?on coumadin P:  ICU monitoring  Neo for map > 65 Trend Troponin x 3 EKG in am Assess TTE ASA 325 mg pr  Check INR   RENAL A:   No acute issues  P:   Trend BMP / urinary output Replace electrolytes as indicated Avoid nephrotoxic agents, ensure adequate renal perfusion  GASTROINTESTINAL A:   No acute issues P:   NPO Pepcid for SUP Consider TF in am   HEMATOLOGIC A:   Anemia- mild P:  Trend CBC Heparin SQ and SCDs FOBT if stools   INFECTIOUS A:   RML opacity/ ? Aspiration PNA Hypothermia - rectal temp 95.7 P:   Follow BC and  Tracheal aspirate Check UA Continue ceftriaxone and azithromycin D/c clindamycin  Trend PCT  Bairhugger prn  Send for strep p urine/ legionella   ENDOCRINE A:   Hyperglycemia P:   SSI sensitive CBG q 4 Check HA1c  NEUROLOGIC A:   Acute encephalopathy  Chronic pain  ? ETOH use - daughter states he stopped drinking ETOH years ago - was following commands in ED prior to intubation and post intubation prior to sedation  P:   RASS goal: 0/-1 Propofol gtt - try to limit for hypotension Consider switching to precedex  Fentanyl gtt  Folate/ MVI/ thiamine daily Assess ETOH level DWA/SBT  FAMILY  - Updates: Patient's daughter, [name] [phone], updated by phone.  Patient prefers to be called "[name]".   - Inter-disciplinary family meet or Palliative Care meeting due by:  03/16/17  Kennieth Rad, AGACNP-BC [facility] Pgr: [phone] or if no answer [phone] 03/10/2017, 12:52 AM   Attending Addendum: I personally saw and examined this patient and agree with plan detailed above. Briefly this is a [phone] with COPD, CHF, CAD, HTN, Afib, AAA, CVA, who presented to the ER in  severe respiratory distress and was immediately intubated upon arrival. Reportedly patient was not wheezing prior to intubation. On exam, he does not appear fluid overloaded. He is intubated and sedated, Lungs CTA b/l, Heart RRR with 3/6 SEM at LLSB, IV access: 16G, 18G, and 20G PIV's. Condom catheter in place. No LE edema. Patient deeply sedated no response to sternal rub. CTA Chest shows severe emphysema, no PE, and only small bibasilar infiltrates (L>R) on my review. Pancultured. Given Ceftriaxone/Azithro/Clinda in the ER. Will trend lactate and check a procal. Has both acute hypoxic and acute hypercapneic respiratory failure; have adjusted the RR on the vent with ABG improving significantly in terms of his hypercapnea. Continue nebs and steroids. Wean vent as tolerated.  60 minutes critical care time   Vernie Murders, MD Pulmonary & Critical Care Medicine

## 2017-03-09 NOTE — ED Triage Notes (Signed)
Arrives via EMS from home with [facility] starting 2100. EMS arrived and gave several treatments, pt declined quickly and was being bagged via BVM on arrival to ED> Intubated immediately. EMS gave 15MG  albuterol, 1mg  atrovent, 2g mag and 125 mg solumedrol with no relief of shob. Hx chf/copd, requiring intubation

## 2017-03-10 ENCOUNTER — Inpatient Hospital Stay (HOSPITAL_COMMUNITY): Payer: Medicare Other

## 2017-03-10 ENCOUNTER — Encounter (HOSPITAL_COMMUNITY): Payer: Self-pay | Admitting: Radiology

## 2017-03-10 ENCOUNTER — Other Ambulatory Visit (HOSPITAL_COMMUNITY): Payer: Medicare Other

## 2017-03-10 DIAGNOSIS — J449 Chronic obstructive pulmonary disease, unspecified: Secondary | ICD-10-CM | POA: Diagnosis present

## 2017-03-10 DIAGNOSIS — J441 Chronic obstructive pulmonary disease with (acute) exacerbation: Secondary | ICD-10-CM | POA: Diagnosis present

## 2017-03-10 DIAGNOSIS — I340 Nonrheumatic mitral (valve) insufficiency: Secondary | ICD-10-CM | POA: Diagnosis not present

## 2017-03-10 DIAGNOSIS — Y909 Presence of alcohol in blood, level not specified: Secondary | ICD-10-CM | POA: Diagnosis present

## 2017-03-10 DIAGNOSIS — I248 Other forms of acute ischemic heart disease: Secondary | ICD-10-CM | POA: Diagnosis present

## 2017-03-10 DIAGNOSIS — I481 Persistent atrial fibrillation: Secondary | ICD-10-CM | POA: Diagnosis present

## 2017-03-10 DIAGNOSIS — J411 Mucopurulent chronic bronchitis: Secondary | ICD-10-CM | POA: Diagnosis not present

## 2017-03-10 DIAGNOSIS — R739 Hyperglycemia, unspecified: Secondary | ICD-10-CM | POA: Diagnosis present

## 2017-03-10 DIAGNOSIS — R7881 Bacteremia: Secondary | ICD-10-CM | POA: Diagnosis not present

## 2017-03-10 DIAGNOSIS — J9600 Acute respiratory failure, unspecified whether with hypoxia or hypercapnia: Secondary | ICD-10-CM | POA: Diagnosis not present

## 2017-03-10 DIAGNOSIS — F17210 Nicotine dependence, cigarettes, uncomplicated: Secondary | ICD-10-CM | POA: Diagnosis not present

## 2017-03-10 DIAGNOSIS — G92 Toxic encephalopathy: Secondary | ICD-10-CM | POA: Diagnosis present

## 2017-03-10 DIAGNOSIS — J690 Pneumonitis due to inhalation of food and vomit: Secondary | ICD-10-CM | POA: Diagnosis present

## 2017-03-10 DIAGNOSIS — D649 Anemia, unspecified: Secondary | ICD-10-CM | POA: Diagnosis present

## 2017-03-10 DIAGNOSIS — J9601 Acute respiratory failure with hypoxia: Secondary | ICD-10-CM | POA: Diagnosis not present

## 2017-03-10 DIAGNOSIS — I480 Paroxysmal atrial fibrillation: Secondary | ICD-10-CM | POA: Diagnosis present

## 2017-03-10 DIAGNOSIS — J1529 Pneumonia due to other staphylococcus: Secondary | ICD-10-CM | POA: Diagnosis not present

## 2017-03-10 DIAGNOSIS — R748 Abnormal levels of other serum enzymes: Secondary | ICD-10-CM | POA: Diagnosis not present

## 2017-03-10 DIAGNOSIS — F10239 Alcohol dependence with withdrawal, unspecified: Secondary | ICD-10-CM | POA: Diagnosis present

## 2017-03-10 DIAGNOSIS — I255 Ischemic cardiomyopathy: Secondary | ICD-10-CM | POA: Diagnosis present

## 2017-03-10 DIAGNOSIS — J432 Centrilobular emphysema: Secondary | ICD-10-CM | POA: Diagnosis not present

## 2017-03-10 DIAGNOSIS — J9602 Acute respiratory failure with hypercapnia: Secondary | ICD-10-CM | POA: Diagnosis present

## 2017-03-10 DIAGNOSIS — I447 Left bundle-branch block, unspecified: Secondary | ICD-10-CM | POA: Diagnosis present

## 2017-03-10 DIAGNOSIS — J9690 Respiratory failure, unspecified, unspecified whether with hypoxia or hypercapnia: Secondary | ICD-10-CM | POA: Diagnosis not present

## 2017-03-10 DIAGNOSIS — R0602 Shortness of breath: Secondary | ICD-10-CM | POA: Diagnosis not present

## 2017-03-10 DIAGNOSIS — I2510 Atherosclerotic heart disease of native coronary artery without angina pectoris: Secondary | ICD-10-CM | POA: Diagnosis not present

## 2017-03-10 DIAGNOSIS — J410 Simple chronic bronchitis: Secondary | ICD-10-CM | POA: Diagnosis not present

## 2017-03-10 DIAGNOSIS — I959 Hypotension, unspecified: Secondary | ICD-10-CM | POA: Diagnosis present

## 2017-03-10 DIAGNOSIS — I5022 Chronic systolic (congestive) heart failure: Secondary | ICD-10-CM | POA: Diagnosis present

## 2017-03-10 DIAGNOSIS — I214 Non-ST elevation (NSTEMI) myocardial infarction: Secondary | ICD-10-CM | POA: Diagnosis not present

## 2017-03-10 DIAGNOSIS — E43 Unspecified severe protein-calorie malnutrition: Secondary | ICD-10-CM | POA: Diagnosis present

## 2017-03-10 DIAGNOSIS — I110 Hypertensive heart disease with heart failure: Secondary | ICD-10-CM | POA: Diagnosis present

## 2017-03-10 DIAGNOSIS — E785 Hyperlipidemia, unspecified: Secondary | ICD-10-CM | POA: Diagnosis present

## 2017-03-10 DIAGNOSIS — R680 Hypothermia, not associated with low environmental temperature: Secondary | ICD-10-CM | POA: Diagnosis present

## 2017-03-10 DIAGNOSIS — G8929 Other chronic pain: Secondary | ICD-10-CM | POA: Diagnosis present

## 2017-03-10 DIAGNOSIS — J42 Unspecified chronic bronchitis: Secondary | ICD-10-CM | POA: Diagnosis not present

## 2017-03-10 DIAGNOSIS — L89151 Pressure ulcer of sacral region, stage 1: Secondary | ICD-10-CM | POA: Diagnosis present

## 2017-03-10 LAB — GLUCOSE, CAPILLARY
GLUCOSE-CAPILLARY: 137 mg/dL — AB (ref 65–99)
GLUCOSE-CAPILLARY: 151 mg/dL — AB (ref 65–99)
Glucose-Capillary: 125 mg/dL — ABNORMAL HIGH (ref 65–99)
Glucose-Capillary: 149 mg/dL — ABNORMAL HIGH (ref 65–99)
Glucose-Capillary: 169 mg/dL — ABNORMAL HIGH (ref 65–99)

## 2017-03-10 LAB — BASIC METABOLIC PANEL
ANION GAP: 5 (ref 5–15)
BUN: 15 mg/dL (ref 6–20)
CHLORIDE: 106 mmol/L (ref 101–111)
CO2: 26 mmol/L (ref 22–32)
Calcium: 7.9 mg/dL — ABNORMAL LOW (ref 8.9–10.3)
Creatinine, Ser: 0.89 mg/dL (ref 0.61–1.24)
GFR calc Af Amer: >60 mL/min (ref >60)
Glucose, Bld: 107 mg/dL — ABNORMAL HIGH (ref 65–99)
POTASSIUM: 3.6 mmol/L (ref 3.5–5.1)
Sodium: 137 mmol/L (ref 135–145)

## 2017-03-10 LAB — BLOOD CULTURE ID PANEL (REFLEXED)
Acinetobacter baumannii: NOT DETECTED
CANDIDA KRUSEI: NOT DETECTED
Candida albicans: NOT DETECTED
Candida glabrata: NOT DETECTED
Candida parapsilosis: NOT DETECTED
Candida tropicalis: NOT DETECTED
ENTEROCOCCUS SPECIES: NOT DETECTED
ESCHERICHIA COLI: NOT DETECTED
Enterobacter cloacae complex: NOT DETECTED
Enterobacteriaceae species: NOT DETECTED
Haemophilus influenzae: NOT DETECTED
Klebsiella oxytoca: NOT DETECTED
Klebsiella pneumoniae: NOT DETECTED
LISTERIA MONOCYTOGENES: NOT DETECTED
Methicillin resistance: NOT DETECTED
NEISSERIA MENINGITIDIS: NOT DETECTED
PROTEUS SPECIES: NOT DETECTED
Pseudomonas aeruginosa: NOT DETECTED
SERRATIA MARCESCENS: NOT DETECTED
STAPHYLOCOCCUS AUREUS BCID: NOT DETECTED
STAPHYLOCOCCUS SPECIES: DETECTED — AB
STREPTOCOCCUS AGALACTIAE: NOT DETECTED
Streptococcus pneumoniae: NOT DETECTED
Streptococcus pyogenes: NOT DETECTED
Streptococcus species: NOT DETECTED

## 2017-03-10 LAB — COMPREHENSIVE METABOLIC PANEL
ALBUMIN: 3.2 g/dL — AB (ref 3.5–5.0)
ALK PHOS: 88 U/L (ref 38–126)
ALT: 14 U/L — AB (ref 17–63)
AST: 24 U/L (ref 15–41)
Anion gap: 8 (ref 5–15)
BILIRUBIN TOTAL: 0.4 mg/dL (ref 0.3–1.2)
BUN: 16 mg/dL (ref 6–20)
CALCIUM: 8.6 mg/dL — AB (ref 8.9–10.3)
CO2: 25 mmol/L (ref 22–32)
CREATININE: 1.06 mg/dL (ref 0.61–1.24)
Chloride: 103 mmol/L (ref 101–111)
GFR calc Af Amer: >60 mL/min (ref >60)
GLUCOSE: 297 mg/dL — AB (ref 65–99)
Potassium: 4.7 mmol/L (ref 3.5–5.1)
Sodium: 136 mmol/L (ref 135–145)
TOTAL PROTEIN: 7 g/dL (ref 6.5–8.1)

## 2017-03-10 LAB — I-STAT ARTERIAL BLOOD GAS, ED
Acid-base deficit: 2 mmol/L (ref 0.0–2.0)
BICARBONATE: 26.9 mmol/L (ref 20.0–28.0)
BICARBONATE: 26.9 mmol/L (ref 20.0–28.0)
O2 SAT: 99 %
O2 SAT: 98 %
PH ART: 7.319 — AB (ref 7.350–7.450)
PH ART: 7.186 — AB (ref 7.350–7.450)
PO2 ART: 128 mmHg — AB (ref 83.0–108.0)
TCO2: 28 mmol/L (ref 0–100)
TCO2: 29 mmol/L (ref 0–100)
pCO2 arterial: 52.4 mmHg — ABNORMAL HIGH (ref 32.0–48.0)
pCO2 arterial: 71.1 mmHg (ref 32.0–48.0)
pO2, Arterial: 138 mmHg — ABNORMAL HIGH (ref 83.0–108.0)

## 2017-03-10 LAB — URINALYSIS, ROUTINE W REFLEX MICROSCOPIC
BILIRUBIN URINE: NEGATIVE
Glucose, UA: >=500 mg/dL — AB
Hgb urine dipstick: NEGATIVE
Ketones, ur: NEGATIVE mg/dL
Leukocytes, UA: NEGATIVE
Nitrite: NEGATIVE
PROTEIN: 30 mg/dL — AB
SPECIFIC GRAVITY, URINE: 1.014 (ref 1.005–1.030)
pH: 5 (ref 5.0–8.0)

## 2017-03-10 LAB — PROCALCITONIN
PROCALCITONIN: 0.14 ng/mL
PROCALCITONIN: 0.69 ng/mL

## 2017-03-10 LAB — CBC
HEMATOCRIT: 27.9 % — AB (ref 39.0–52.0)
HEMOGLOBIN: 7.9 g/dL — AB (ref 13.0–17.0)
MCH: 23.9 pg — ABNORMAL LOW (ref 26.0–34.0)
MCHC: 28.3 g/dL — ABNORMAL LOW (ref 30.0–36.0)
MCV: 84.5 fL (ref 78.0–100.0)
Platelets: 197 10*3/uL (ref 150–400)
RBC: 3.3 MIL/uL — AB (ref 4.22–5.81)
RDW: 16 % — ABNORMAL HIGH (ref 11.5–15.5)
WBC: 6.6 10*3/uL (ref 4.0–10.5)

## 2017-03-10 LAB — LACTIC ACID, PLASMA
Lactic Acid, Venous: 2.2 mmol/L (ref 0.5–1.9)
Lactic Acid, Venous: 2.4 mmol/L (ref 0.5–1.9)

## 2017-03-10 LAB — STREP PNEUMONIAE URINARY ANTIGEN: Strep Pneumo Urinary Antigen: NEGATIVE

## 2017-03-10 LAB — TROPONIN I: TROPONIN I: 0.08 ng/mL — AB (ref <0.03)

## 2017-03-10 LAB — ETHANOL

## 2017-03-10 LAB — BRAIN NATRIURETIC PEPTIDE: B Natriuretic Peptide: 412.2 pg/mL — ABNORMAL HIGH (ref 0.0–100.0)

## 2017-03-10 LAB — CBG MONITORING, ED
Glucose-Capillary: 136 mg/dL — ABNORMAL HIGH (ref 65–99)
Glucose-Capillary: 225 mg/dL — ABNORMAL HIGH (ref 65–99)

## 2017-03-10 LAB — MAGNESIUM: Magnesium: 2.3 mg/dL (ref 1.7–2.4)

## 2017-03-10 LAB — MRSA PCR SCREENING: MRSA BY PCR: NEGATIVE

## 2017-03-10 LAB — PROTIME-INR
INR: 1.14
Prothrombin Time: 14.7 seconds (ref 11.4–15.2)

## 2017-03-10 MED ORDER — FENTANYL BOLUS VIA INFUSION
25.0000 ug | INTRAVENOUS | Status: DC | PRN
  Filled 2017-03-10: qty 25

## 2017-03-10 MED ORDER — FENTANYL CITRATE (PF) 100 MCG/2ML IJ SOLN
50.0000 ug | Freq: Once | INTRAMUSCULAR | Status: AC
  Administered 2017-03-10: 50 ug via INTRAVENOUS

## 2017-03-10 MED ORDER — SODIUM CHLORIDE 0.9 % IV SOLN
250.0000 mL | INTRAVENOUS | Status: DC | PRN
  Administered 2017-03-13: 250 mL via INTRAVENOUS

## 2017-03-10 MED ORDER — FOLIC ACID 1 MG PO TABS
1.0000 mg | ORAL_TABLET | Freq: Every day | ORAL | Status: DC
  Administered 2017-03-10 – 2017-03-17 (×7): 1 mg
  Filled 2017-03-10 (×7): qty 1

## 2017-03-10 MED ORDER — VITAL HIGH PROTEIN PO LIQD
1000.0000 mL | ORAL | Status: DC
  Administered 2017-03-10: 1000 mL

## 2017-03-10 MED ORDER — SODIUM CHLORIDE 0.9 % IV SOLN
0.0000 ug/min | INTRAVENOUS | Status: DC
  Administered 2017-03-10: 20 ug/min via INTRAVENOUS
  Filled 2017-03-10: qty 1

## 2017-03-10 MED ORDER — PERFLUTREN LIPID MICROSPHERE
1.0000 mL | INTRAVENOUS | Status: DC | PRN
  Filled 2017-03-10: qty 10

## 2017-03-10 MED ORDER — AZITHROMYCIN 500 MG IV SOLR
500.0000 mg | INTRAVENOUS | Status: DC
  Administered 2017-03-11: 500 mg via INTRAVENOUS
  Filled 2017-03-10 (×2): qty 500

## 2017-03-10 MED ORDER — INSULIN ASPART 100 UNIT/ML ~~LOC~~ SOLN
0.0000 [IU] | SUBCUTANEOUS | Status: DC
  Administered 2017-03-10: 1 [IU] via SUBCUTANEOUS
  Administered 2017-03-10: 2 [IU] via SUBCUTANEOUS
  Administered 2017-03-10: 1 [IU] via SUBCUTANEOUS
  Administered 2017-03-10: 5 [IU] via SUBCUTANEOUS
  Administered 2017-03-10: 2 [IU] via SUBCUTANEOUS
  Administered 2017-03-10 – 2017-03-11 (×5): 1 [IU] via SUBCUTANEOUS
  Administered 2017-03-11 (×2): 2 [IU] via SUBCUTANEOUS
  Administered 2017-03-12 (×3): 1 [IU] via SUBCUTANEOUS
  Administered 2017-03-12: 2 [IU] via SUBCUTANEOUS
  Administered 2017-03-13 – 2017-03-14 (×3): 1 [IU] via SUBCUTANEOUS
  Administered 2017-03-14 – 2017-03-15 (×3): 2 [IU] via SUBCUTANEOUS
  Administered 2017-03-15: 1 [IU] via SUBCUTANEOUS
  Administered 2017-03-15: 2 [IU] via SUBCUTANEOUS
  Administered 2017-03-15: 1 [IU] via SUBCUTANEOUS
  Administered 2017-03-15: 2 [IU] via SUBCUTANEOUS
  Administered 2017-03-15: 1 [IU] via SUBCUTANEOUS
  Administered 2017-03-16 – 2017-03-17 (×4): 2 [IU] via SUBCUTANEOUS
  Administered 2017-03-17 (×2): 1 [IU] via SUBCUTANEOUS
  Filled 2017-03-10 (×2): qty 1

## 2017-03-10 MED ORDER — VITAL AF 1.2 CAL PO LIQD
1000.0000 mL | ORAL | Status: DC
  Administered 2017-03-10: 1000 mL

## 2017-03-10 MED ORDER — ASPIRIN 81 MG PO CHEW: 324.0000 mg | CHEWABLE_TABLET | ORAL | Status: AC

## 2017-03-10 MED ORDER — SODIUM CHLORIDE 0.9 % IV BOLUS (SEPSIS)
500.0000 mL | Freq: Once | INTRAVENOUS | Status: AC
  Administered 2017-03-10: 500 mL via INTRAVENOUS

## 2017-03-10 MED ORDER — CHLORHEXIDINE GLUCONATE 0.12% ORAL RINSE (MEDLINE KIT)
15.0000 mL | Freq: Two times a day (BID) | OROMUCOSAL | Status: DC
  Administered 2017-03-10 – 2017-03-13 (×7): 15 mL via OROMUCOSAL

## 2017-03-10 MED ORDER — IPRATROPIUM-ALBUTEROL 0.5-2.5 (3) MG/3ML IN SOLN
3.0000 mL | Freq: Four times a day (QID) | RESPIRATORY_TRACT | Status: DC
  Administered 2017-03-10 – 2017-03-12 (×9): 3 mL via RESPIRATORY_TRACT
  Filled 2017-03-10 (×9): qty 3

## 2017-03-10 MED ORDER — METHYLPREDNISOLONE SODIUM SUCC 125 MG IJ SOLR
60.0000 mg | Freq: Four times a day (QID) | INTRAMUSCULAR | Status: DC
  Administered 2017-03-10 – 2017-03-11 (×6): 60 mg via INTRAVENOUS
  Filled 2017-03-10: qty 2
  Filled 2017-03-10 (×2): qty 0.96
  Filled 2017-03-10: qty 2
  Filled 2017-03-10 (×2): qty 0.96
  Filled 2017-03-10: qty 2
  Filled 2017-03-10: qty 0.96

## 2017-03-10 MED ORDER — ORAL CARE MOUTH RINSE
15.0000 mL | Freq: Four times a day (QID) | OROMUCOSAL | Status: DC
  Administered 2017-03-10 – 2017-03-12 (×8): 15 mL via OROMUCOSAL

## 2017-03-10 MED ORDER — ARFORMOTEROL TARTRATE 15 MCG/2ML IN NEBU
15.0000 ug | INHALATION_SOLUTION | Freq: Two times a day (BID) | RESPIRATORY_TRACT | Status: DC
  Administered 2017-03-10 – 2017-03-11 (×3): 15 ug via RESPIRATORY_TRACT
  Filled 2017-03-10 (×3): qty 2

## 2017-03-10 MED ORDER — PRO-STAT SUGAR FREE PO LIQD
30.0000 mL | Freq: Two times a day (BID) | ORAL | Status: DC
  Administered 2017-03-10: 30 mL
  Filled 2017-03-10: qty 30

## 2017-03-10 MED ORDER — IOPAMIDOL (ISOVUE-370) INJECTION 76%
INTRAVENOUS | Status: AC
  Filled 2017-03-10: qty 100

## 2017-03-10 MED ORDER — DEXTROSE 5 % IV SOLN
1.0000 g | INTRAVENOUS | Status: AC
  Administered 2017-03-10 – 2017-03-15 (×6): 1 g via INTRAVENOUS
  Filled 2017-03-10 (×6): qty 10

## 2017-03-10 MED ORDER — IPRATROPIUM-ALBUTEROL 0.5-2.5 (3) MG/3ML IN SOLN
3.0000 mL | RESPIRATORY_TRACT | Status: DC
  Administered 2017-03-10: 3 mL via RESPIRATORY_TRACT
  Filled 2017-03-10: qty 3

## 2017-03-10 MED ORDER — PROPOFOL 1000 MG/100ML IV EMUL
5.0000 ug/kg/min | INTRAVENOUS | Status: DC
  Administered 2017-03-10 – 2017-03-11 (×2): 15 ug/kg/min via INTRAVENOUS
  Filled 2017-03-10 (×2): qty 100

## 2017-03-10 MED ORDER — DOCUSATE SODIUM 50 MG/5ML PO LIQD
100.0000 mg | Freq: Two times a day (BID) | ORAL | Status: DC | PRN
  Filled 2017-03-10: qty 10

## 2017-03-10 MED ORDER — ADULT MULTIVITAMIN W/MINERALS CH
1.0000 | ORAL_TABLET | Freq: Every day | ORAL | Status: DC
  Administered 2017-03-10 – 2017-03-17 (×7): 1
  Filled 2017-03-10 (×7): qty 1

## 2017-03-10 MED ORDER — THIAMINE HCL 100 MG/ML IJ SOLN
100.0000 mg | Freq: Every day | INTRAMUSCULAR | Status: DC
  Administered 2017-03-10 – 2017-03-12 (×3): 100 mg via INTRAVENOUS
  Filled 2017-03-10 (×2): qty 1
  Filled 2017-03-10: qty 2

## 2017-03-10 MED ORDER — FAMOTIDINE IN NACL 20-0.9 MG/50ML-% IV SOLN
20.0000 mg | Freq: Two times a day (BID) | INTRAVENOUS | Status: DC
  Administered 2017-03-10 – 2017-03-12 (×6): 20 mg via INTRAVENOUS
  Filled 2017-03-10 (×6): qty 50

## 2017-03-10 MED ORDER — PROPOFOL 1000 MG/100ML IV EMUL
INTRAVENOUS | Status: AC
  Administered 2017-03-10: 40 ug/kg/min
  Filled 2017-03-10: qty 100

## 2017-03-10 MED ORDER — HEPARIN SODIUM (PORCINE) 5000 UNIT/ML IJ SOLN
5000.0000 [IU] | Freq: Three times a day (TID) | INTRAMUSCULAR | Status: DC
  Administered 2017-03-10 – 2017-03-13 (×11): 5000 [IU] via SUBCUTANEOUS
  Filled 2017-03-10 (×13): qty 1

## 2017-03-10 MED ORDER — BUDESONIDE 0.5 MG/2ML IN SUSP
0.5000 mg | Freq: Two times a day (BID) | RESPIRATORY_TRACT | Status: DC
Start: 2017-03-10 — End: 2017-03-12
  Administered 2017-03-10 – 2017-03-12 (×5): 0.5 mg via RESPIRATORY_TRACT
  Filled 2017-03-10 (×6): qty 2

## 2017-03-10 MED ORDER — FENTANYL 2500MCG IN NS 250ML (10MCG/ML) PREMIX INFUSION
25.0000 ug/h | INTRAVENOUS | Status: DC
  Administered 2017-03-10: 50 ug/h via INTRAVENOUS
  Filled 2017-03-10 (×2): qty 250

## 2017-03-10 MED ORDER — ASPIRIN 300 MG RE SUPP
300.0000 mg | RECTAL | Status: AC
  Administered 2017-03-10: 300 mg via RECTAL
  Filled 2017-03-10: qty 1

## 2017-03-10 MED ORDER — IOPAMIDOL (ISOVUE-370) INJECTION 76%
INTRAVENOUS | Status: AC
  Administered 2017-03-10: 100 mL
  Filled 2017-03-10: qty 100

## 2017-03-10 NOTE — Progress Notes (Signed)
PHARMACY - PHYSICIAN COMMUNICATION CRITICAL VALUE ALERT - BLOOD CULTURE IDENTIFICATION (BCID)  Results for orders placed or performed during the hospital encounter of 03/09/17  Blood Culture ID Panel (Reflexed) (Collected: 03/09/2017 11:15 PM)  Result Value Ref Range   Enterococcus species NOT DETECTED NOT DETECTED   Listeria monocytogenes NOT DETECTED NOT DETECTED   Staphylococcus species DETECTED (A) NOT DETECTED   Staphylococcus aureus NOT DETECTED NOT DETECTED   Methicillin resistance NOT DETECTED NOT DETECTED   Streptococcus species NOT DETECTED NOT DETECTED   Streptococcus agalactiae NOT DETECTED NOT DETECTED   Streptococcus pneumoniae NOT DETECTED NOT DETECTED   Streptococcus pyogenes NOT DETECTED NOT DETECTED   Acinetobacter baumannii NOT DETECTED NOT DETECTED   Enterobacteriaceae species NOT DETECTED NOT DETECTED   Enterobacter cloacae complex NOT DETECTED NOT DETECTED   Escherichia coli NOT DETECTED NOT DETECTED   Klebsiella oxytoca NOT DETECTED NOT DETECTED   Klebsiella pneumoniae NOT DETECTED NOT DETECTED   Proteus species NOT DETECTED NOT DETECTED   Serratia marcescens NOT DETECTED NOT DETECTED   Haemophilus influenzae NOT DETECTED NOT DETECTED   Neisseria meningitidis NOT DETECTED NOT DETECTED   Pseudomonas aeruginosa NOT DETECTED NOT DETECTED   Candida albicans NOT DETECTED NOT DETECTED   Candida glabrata NOT DETECTED NOT DETECTED   Candida krusei NOT DETECTED NOT DETECTED   Candida parapsilosis NOT DETECTED NOT DETECTED   Candida tropicalis NOT DETECTED NOT DETECTED    Name of physician (or Provider) Contacted: paged adult CCM  Changes to prescribed antibiotics required: none  Jodean Lima Tylor Courtwright 03/10/2017  6:22 PM

## 2017-03-10 NOTE — Progress Notes (Signed)
[facility] Progress Note Patient Name: [name] DOB: 27-Jul-1946 MRN: [id number]   Date of Service  03/10/2017  HPI/Events of Note    eICU Interventions  Renew propofol order     Intervention Category Intermediate Interventions: Change in mental status - evaluation and management  Simonne Maffucci 03/10/2017, 5:34 AM

## 2017-03-10 NOTE — Progress Notes (Signed)
Patient arrived from ED with wallet in back pocket. Wallet contained $454.63. Counted money with Josue Hector, RN. Wallet placed in patient belongings envelope. However, unable to bring to security as they were assessing a lockdown situation. Will relay information to oncoming shift.

## 2017-03-10 NOTE — Progress Notes (Signed)
Pharmacy Antibiotic Note Doctor Sheahan is a 71 y.o. male admitted on 03/09/2017 with concern for CAP.   Pharmacy has been consulted for Ceftriaxone dosing.  Plan: 1. Ceftriaxone 1 gram IV every 24 hours 2. Pharmacy to follow peripherally  Height: 5\' 5"  (165.1 cm) Weight: 132 lb 4.4 oz (60 kg) IBW/kg (Calculated) : 61.5  Temp (24hrs), Avg:96.3 F (35.7 C), Min:95.7 F (35.4 C), Max:96.9 F (36.1 C)   Recent Labs Lab 03/09/17 2258 03/09/17 2315  WBC 11.1*  --   CREATININE 1.06  --   LATICACIDVEN  --  1.9    Estimated Creatinine Clearance: 55.8 mL/min (by C-G formula based on SCr of 1.06 mg/dL).    No Known Allergies   Thank you for allowing pharmacy to be a part of this patient's care.  Vincenza Hews, PharmD, BCPS 03/10/2017, 1:01 AM

## 2017-03-10 NOTE — ED Notes (Signed)
Date and time results received: 03/10/17 0003 (use smartphrase ".now" to insert current time)  Test: Troponin Critical Value: 0.08  Name of Provider Notified: Venora Maples MD  Orders Received? Or Actions Taken?: no new orders

## 2017-03-10 NOTE — Progress Notes (Signed)
Mr. [name]'s belongings that were locked up by security last night were returned to his daughter with his permission. Pt nodded permission which was witnessed by Anneita Minor, RN. Daughter, [name], received the valuables envelope and all contents were verified by her in front of Kyung Rudd, Therapist, sports.

## 2017-03-10 NOTE — ED Notes (Signed)
Retail banker on

## 2017-03-10 NOTE — ED Notes (Signed)
Date and time results received: 03/10/17 3:44 AM (use smartphrase ".now" to insert current time)  Test: Lactic Acid Critical Value: 2.2  Name of Provider Notified: Phylliss Bob NP  Orders Received? Or Actions Taken?: awaiting response

## 2017-03-10 NOTE — Progress Notes (Addendum)
Initial Nutrition Assessment  DOCUMENTATION CODES:   Severe malnutrition in context of chronic illness  INTERVENTION:    Vital AF 1.2 at 55 ml/h (1320 ml per day)   Provides 1584 kcal (1727 kcal total with Propofol), 99 gm protein, 1071 ml free water daily  NUTRITION DIAGNOSIS:   Malnutrition (severe) related to chronic illness (COPD) as evidenced by severe depletion of body fat, severe depletion of muscle mass.  GOAL:   Patient will meet greater than or equal to 90% of their needs  MONITOR:   Vent status, TF tolerance, Labs, I & O's  REASON FOR ASSESSMENT:   Consult Enteral/tube feeding initiation and management  ASSESSMENT:   71 yo male with PMH of COPD, CAD, HTN, CHF, AAA who was admitted on 7/2 with respiratory distress requiring intubation.  Discussed patient with RN today. Received MD Consult for TF initiation and management. Nutrition-Focused physical exam completed. Findings are severe fat depletion, severe muscle depletion, and no edema.  Patient's daughter reports that patient loves to eat sweets, such as brownies and hot chocolate. He has progressively lost weight over the past few years. Patient is currently intubated on ventilator support MV: 12.5 L/min Temp (24hrs), Avg:97.4 F (36.3 C), Min:95.7 F (35.4 C), Max:98.5 F (36.9 C)  Propofol: 5.4 ml/hr providing 143 kcal from lipids Labs reviewed. CBG's: [phone] Medications reviewed and include folic acid, MVI, thiamine.  Diet Order:  Diet NPO time specified  Skin:  Wound (see comment) (stage 1 to sacrum)  Last BM:  unknown  Height:   Ht Readings from Last 1 Encounters:  03/09/17 5\' 5"  (1.651 m)    Weight:   Wt Readings from Last 1 Encounters:  03/10/17 137 lb 12.6 oz (62.5 kg)    Ideal Body Weight:  61.8 kg  BMI:  Body mass index is 22.93 kg/m.  Estimated Nutritional Needs:   Kcal:  1610  Protein:  85-100 gm  Fluid:  1.8 L  EDUCATION NEEDS:   No education needs  identified at this time  Molli Barrows, [place], [place], [place] Pager [phone] After Hours Pager [phone]

## 2017-03-10 NOTE — Progress Notes (Addendum)
PULMONARY / CRITICAL CARE MEDICINE   Name: [name] MRN: [id number] DOB: 04/07/1946    ADMISSION DATE:  03/09/2017 CHIEF COMPLAINT:  Respiratory distress  HISTORY OF PRESENT ILLNESS:   71yo M pmh including: CAD, AAA w/o rupture, persistent afib with hx RVR (on coumadin w/ subtherapeutic INR), COPD, bronchiectasis, ischemic cardiomyopathy, femoral artery dissection, illiac a. anneurysm who presented to ED via EMS for acute dyspnea. Patient reportedly called from home after becoming symptomatic.  Required bag mask ventilation en route to [facility] and was unresponsive upon arrival to [facility]. Reportedly had some vomiting as well with questionable aspiration. CXR showed RML opacity. EKG with incomplete LBBB. Started on ceftriaxone and azithromycin and intubated.    SUBJECTIVE:  vented  VITAL SIGNS: BP 111/62   Pulse 62   Temp 98.2 F (36.8 C) (Oral)   Resp 20   Ht 5\' 5"  (1.651 m)   Wt 137 lb 12.6 oz (62.5 kg)   SpO2 100%   BMI 22.93 kg/m   HEMODYNAMICS:    VENTILATOR SETTINGS: Vent Mode: PRVC FiO2 (%):  [40 %-60 %] 40 % Set Rate:  [15 bmp-20 bmp] 20 bmp Vt Set:  [550 mL] 550 mL PEEP:  [5 cmH20] 5 cmH20 Plateau Pressure:  [15 cmH20-22 cmH20] 16 cmH20  INTAKE / OUTPUT: I/O last 3 completed shifts: In: 873.5 [I.V.:223.5; IV Piggyback:650] Out: 360 [Urine:360]  PHYSICAL EXAMINATION: General:  71yo M intubated appearing uncomfortable with ET tube Neuro:  Opens eyes and responds to commands HEENT:  [place]/AT, no JVD Cardiovascular:  RRR, s1/s2 present, 2/6 murmur at LSB Lungs: bilateral rhonchi Abdomen:  Soft, NTND Musculoskeletal:  No gross deformities Skin:  No rashes or lesions Ext: no edema  LABS:  BMET  Recent Labs Lab 03/09/17 2258 03/10/17 0533  NA 136 137  K 4.7 3.6  CL 103 106  CO2 25 26  BUN 16 15  CREATININE 1.06 0.89  GLUCOSE 297* 107*    Electrolytes  Recent Labs Lab 03/09/17 2258 03/10/17 0147 03/10/17 0533  CALCIUM 8.6*  --  7.9*  MG  --   2.3  --     CBC  Recent Labs Lab 03/09/17 2258 03/10/17 0533  WBC 11.1* 6.6  HGB 9.6* 7.9*  HCT 33.6* 27.9*  PLT 321 197    Coag's  Recent Labs Lab 03/10/17 0533  INR 1.14    Sepsis Markers  Recent Labs Lab 03/09/17 2315 03/10/17 0147 03/10/17 0533  LATICACIDVEN 1.9 2.2* 2.4*  PROCALCITON  --  0.14 0.69    ABG  Recent Labs Lab 03/10/17 0020 03/10/17 0244  PHART 7.186* 7.319*  PCO2ART 71.1* 52.4*  PO2ART 138.0* 128.0*    Liver Enzymes  Recent Labs Lab 03/09/17 2258  AST 24  ALT 14*  ALKPHOS 88  BILITOT 0.4  ALBUMIN 3.2*    Cardiac Enzymes  Recent Labs Lab 03/09/17 2258  TROPONINI 0.08*    Glucose  Recent Labs Lab 03/10/17 0055 03/10/17 0353  GLUCAP 225* 136*    Imaging Ct Angio Chest Pe W Or Wo Contrast  Result Date: 03/10/2017 CLINICAL DATA:  [name]; shortness of breath and unresponsiveness. History of COPD and congestive heart failure. EXAM: CT ANGIOGRAPHY CHEST WITH CONTRAST TECHNIQUE: Multidetector CT imaging of the chest was performed using the standard protocol during bolus administration of intravenous contrast. Multiplanar CT image reconstructions and MIPs were obtained to evaluate the vascular anatomy. CONTRAST:  70 cc Isovue 370 COMPARISON:  03/09/2017 chest radiograph FINDINGS: Cardiovascular: Mild cardiomegaly. Severe coronary artery  calcification. Moderate calcific atherosclerosis of the thoracic aorta. Satisfactory opacification of the pulmonary arteries. No pulmonary embolus identified. Mediastinum/Nodes: No enlarged mediastinal, hilar, or axillary lymph nodes. Thyroid gland, trachea, and esophagus demonstrate no significant findings. Lungs/Pleura: Severe emphysema with lung apex predominance. Right upper lobe ground-glass opacity measures 12 mm (series 8, image 78). Left lower lobe solid 7 mm nodule (series 8, image 113). Bilateral lower lobe dependent consolidations, diffuse peribronchial thickening and scattered mucous  plugging may represent pneumonia or aspiration. Small bilateral pleural effusions. Upper Abdomen: No acute abnormality. Musculoskeletal: No chest wall abnormality. No acute or significant osseous findings. Review of the MIP images confirms the above findings. IMPRESSION: 1. No pulmonary embolus identified. 2. Dependent consolidations with mucous plugging in the lung bases may represent pneumonia or aspiration. Small bilateral pleural effusions. 3. Mild cardiomegaly and severe coronary artery calcification. 4. 12 mm right upper lobe ground-glass nodule and 7 mm left lower lobe solid nodule. Non-contrast chest CT at 3-6 months is recommended. If the nodules are stable at time of repeat CT, then future CT at 18-24 months (from today's scan) is considered optional for low-risk patients, but is recommended for high-risk patients. This recommendation follows the consensus statement: Guidelines for Management of Incidental Pulmonary Nodules Detected on CT Images: From the Fleischner Society 2017; Radiology 2017; 284:228-243. Electronically Signed   By: Kristine Garbe M.D.   On: 03/10/2017 03:43   Dg Chest Portable 1 View  Result Date: 03/09/2017 CLINICAL DATA:  Endotracheal tube placement and orogastric tube placement. Initial encounter. EXAM: PORTABLE CHEST 1 VIEW COMPARISON:  None. FINDINGS: The patient's endotracheal tube is seen ending 6 cm above the carina. An enteric tube is noted extending below the diaphragm. Right midlung airspace opacity may reflect pneumonia. Underlying interstitial prominence and bilateral fibrotic change are noted. No definite pleural effusion or pneumothorax is seen. The cardiomediastinal silhouette is mildly enlarged. No acute osseous abnormalities are identified. IMPRESSION: 1. Endotracheal tube seen ending 6 cm above the carina. Enteric tube noted extending below the diaphragm. 2. Right midlung airspace opacity raises concern for pneumonia. 3. Underlying interstitial  prominence and bilateral fibrosis noted. 4. Mild cardiomegaly. Electronically Signed   By: Garald Balding M.D.   On: 03/09/2017 23:08     STUDIES:  CXR 7/2 >> ETT 6 cm above carina, enteric tube below diaphragm; RML opacity concerning for pneumonia, underlying interstitial prominence and bilateral fibrosis; mild cardiomegaly EKG 7/2 >> SR 76, incomplete LBBB, STD CT angio chest: no PE, dependent consolidations with mucous plugging could be PNA vs: aspiration. Small bilateral pleural effusions. 12 mm right upper lobe ground-glass nodule and 7 mm left lower lobe solid nodule. Echo: pending  CULTURES: BCx2 7/2 >> Tracheal aspirate 7/2 >>  ANTIBIOTICS: Ceftriaxone 7/2 >> azithromcyin 7/2 >> Clindamycin 7/2 >>7/2  SIGNIFICANT EVENTS: 7/2  Admit and intubation  LINES/TUBES: ETT 7/2 >> OGT 7/2 >> Foley 7/3 >> PIV x 3   DISCUSSION: 71yo M with complex medical hx   ASSESSMENT / PLAN:  PULMONARY A: Acute rsp failure with RML opacity concerning for CAP vs: aspiration PNA. Hx bronchiectasis and COPD and current smoker. Intubated. CTA chest no PE.  P:   Wean FiO2/PEEP for sats >92% Brovonna and Pulmicort BID Duonebs q 4 hr prn F/u sputum cultures, bcx Continue abx  CARDIOVASCULAR A:  Hx systolic HF, HLD, HTN, ischemic cardiomyopathy with EF 35-40% with diffuse hypokinesis, 5/18 s/8 AAA repair, persistent Afib on coumadin. INR 1.14. MAP 77 P:  Repeat echo AM EKG ASA  [place]rent INR Continue neo for MAP >60  RENAL A:   No acute issues. Cr WNL P:   Trend BMP / urinary output Replace electrolytes as indicated Avoid nephrotoxic agents, ensure adequate renal perfusion  GASTROINTESTINAL A:   Malnutrition (severe) related to chronic illness (COPD) as evidenced by severe depletion of body fat, severe depletion of muscle mass  P:   NPO Pepcid for SUP Consider TF in am   HEMATOLOGIC A:   Hgb trending downward 9.6>7.9 P:  Trend CBC Heparin SQ and SCDs FOBT if stools   Consider 1U PRBC  INFECTIOUS A:   RML opacity/ ? Aspiration PNA Hypothermia - rectal temp 95.7, temp this AM to 98.2. UA glucose >500, no signs infection P:   Follow BC and Tracheal aspirate Continue ceftriaxone and azithromycin Trend PCT  [place]ugger prn  Send for strep p urine/ legionella   ENDOCRINE A:   Hyperglycemia P:   SSI sensitive CBG q 4 Check HA1c  NEUROLOGIC A:   Acute encephalopathy  Chronic pain  ? ETOH use - daughter states he stopped drinking ETOH years ago - was following commands in ED prior to intubation and post intubation prior to sedation  P:   RASS goal: 0/-1 Propofol gtt - try to limit for hypotension Consider switching to precedex  Fentanyl gtt  Folate/ MVI/ thiamine daily Assess ETOH level DWA/SBT  FAMILY  - Updates: Patient's daughter, Mirko Tailor [phone], updated by phone.  Patient prefers to be called "Bud".   - Inter-disciplinary family meet or Palliative Care meeting due by:  03/16/17   Pulmonary and [place] Pager: [phone]  03/10/2017, 8:56 AM  STAFF NOTE: I, Merrie Roof, MD FACP have personally reviewed patient's available data, including medical history, events of note, physical examination and test results as part of my evaluation. I have discussed with resident/NP and other care providers such as pharmacist, RN and RRT. In addition, I personally evaluated patient and elicited key findings of: rass -1, fc, no distress on vent, reduced, large lungs, coarse, abdo soft, no edema legs, pcxr which I reviewed revelaed rt side new infiltrate and CT I reviewed did not show PE showed basilar infiltrates. ATX, his clinical circumstance is that of acute changes acute asp vomit /  hcap ( not blossomed on pcxr yet) In setting of multiple medical problems ( copd, chf), he is not hospitalized since April 17, keep CAP coverage, he had acute resp acidosis now correcting, keep same MV on vent,  SBT okay, not likely to extubate, BP is better, allow pos balance, kvo for now, echo repeat, no lasix, may need to wean prop to fent drip alone, pcxr in am , feed pt, family on way in to discuss pt prior wishes The patient is critically ill with multiple organ systems failure and requires high complexity decision making for assessment and support, frequent evaluation and titration of therapies, application of advanced monitoring technologies and extensive interpretation of multiple databases.   Critical Care Time devoted to patient care services described in this note is 35 Minutes. This time reflects time of care of this signee: Merrie Roof, MD FACP. This critical care time does not reflect procedure time, or teaching time or supervisory time of PA/NP/Med student/Med Resident etc but could involve care discussion time. Rest per NP/medical resident whose note is outlined above and that I agree with   Lavon Paganini. Titus Mould, MD, [place] Pgr: [facility] 03/10/2017 9:55 AM

## 2017-03-10 NOTE — Progress Notes (Signed)
PCCM Interval Progress Note  Of note, patient has another chart with extensive PMH under MRN [id number].  Charts to be merged.    Spoke to patient's daughter, Burak Zerbe, and updated her over the phone [phone]).  She reports that she was unaware of the acute leading up to last night but feels like her father has not felt well for some time. She request that she be called for any changes or concerns.     Kennieth Rad, AGACNP-BC [facility] Pgr: [phone] or if no answer [phone] 03/10/2017, 2:00 AM

## 2017-03-10 NOTE — Progress Notes (Signed)
*  PRELIMINARY RESULTS* Echocardiogram 2D Echocardiogram has been performed.  [name] 03/10/2017, 3:22 PM

## 2017-03-10 NOTE — Progress Notes (Signed)
Patient transported on vent from ED to CT and back without complications.

## 2017-03-10 NOTE — Progress Notes (Signed)
Dr. Oletta Darter notified of low urine output and no order for H/H recheck.

## 2017-03-10 NOTE — Progress Notes (Signed)
[facility] Progress Note Patient Name: [name] DOB: 10/26/1945 MRN: [id number]   Date of Service  03/10/2017  HPI/Events of Note  Oliguria - LVEF = 50-55%.  eICU Interventions  Will order: 1. Bolus with 0.9 NaCl 500 mL IV over 30 minutes now.      Intervention Category Intermediate Interventions: Oliguria - evaluation and management  Sommer,Steven Eugene 03/10/2017, 6:35 PM

## 2017-03-10 NOTE — Progress Notes (Signed)
Patient transported on vent from ED to 54M without complications.

## 2017-03-11 DIAGNOSIS — L8990 Pressure ulcer of unspecified site, unspecified stage: Secondary | ICD-10-CM | POA: Diagnosis present

## 2017-03-11 DIAGNOSIS — J9601 Acute respiratory failure with hypoxia: Secondary | ICD-10-CM

## 2017-03-11 LAB — ECHOCARDIOGRAM COMPLETE
AVLVOTPG: 6 mmHg
Area-P 1/2: 2.24 cm2
E decel time: 197 msec
EERAT: 11.86
FS: 28 % (ref 28–44)
HEIGHTINCHES: 65 in
IVS/LV PW RATIO, ED: 0.9
LA ID, A-P, ES: 38 mm
LA diam end sys: 38 mm
LA diam index: 2.24 cm/m2
LAVOL: 82.9 mL
LAVOLA4C: 80.3 mL
LAVOLIN: 48.8 mL/m2
LV E/e' medial: 11.86
LV E/e'average: 11.86
LV PW d: 10 mm — AB (ref 0.6–1.1)
LV dias vol index: 68 mL/m2
LV dias vol: 116 mL (ref 62–150)
LV sys vol index: 34 mL/m2
LVELAT: 8.38 cm/s
LVOT VTI: 23.1 cm
LVOTPV: 121 cm/s
LVSYSVOL: 58 mL (ref 21–61)
MV Dec: 197
MV Peak grad: 4 mmHg
MV pk A vel: 77.1 m/s
MV pk E vel: 99.4 m/s
P 1/2 time: 58 ms
RV LATERAL S' VELOCITY: 18.7 cm/s
RV TAPSE: 25.8 mm
Simpson's disk: 50
Stroke v: 58 ml
TDI e' lateral: 8.38
TDI e' medial: 5.55
WEIGHTICAEL: 2204.6 [oz_av]

## 2017-03-11 LAB — GLUCOSE, CAPILLARY
GLUCOSE-CAPILLARY: 159 mg/dL — AB (ref 65–99)
GLUCOSE-CAPILLARY: 131 mg/dL — AB (ref 65–99)
Glucose-Capillary: 139 mg/dL — ABNORMAL HIGH (ref 65–99)
Glucose-Capillary: 128 mg/dL — ABNORMAL HIGH (ref 65–99)
Glucose-Capillary: 136 mg/dL — ABNORMAL HIGH (ref 65–99)

## 2017-03-11 LAB — CBC WITH DIFFERENTIAL/PLATELET
BASOS ABS: 0 10*3/uL (ref 0.0–0.1)
Basophils Relative: 0 %
EOS PCT: 0 %
Eosinophils Absolute: 0 10*3/uL (ref 0.0–0.7)
HCT: 26.6 % — ABNORMAL LOW (ref 39.0–52.0)
Hemoglobin: 8 g/dL — ABNORMAL LOW (ref 13.0–17.0)
Lymphocytes Relative: 4 %
Lymphs Abs: 0.4 10*3/uL — ABNORMAL LOW (ref 0.7–4.0)
MCH: 24.6 pg — ABNORMAL LOW (ref 26.0–34.0)
MCHC: 30.1 g/dL (ref 30.0–36.0)
MCV: 81.8 fL (ref 78.0–100.0)
Monocytes Absolute: 0.3 10*3/uL (ref 0.1–1.0)
Monocytes Relative: 3 %
NEUTROS ABS: 8.9 10*3/uL — AB (ref 1.7–7.7)
Neutrophils Relative %: 93 %
PLATELETS: 231 10*3/uL (ref 150–400)
RBC: 3.25 MIL/uL — AB (ref 4.22–5.81)
RDW: 16.3 % — ABNORMAL HIGH (ref 11.5–15.5)
WBC: 9.5 10*3/uL (ref 4.0–10.5)

## 2017-03-11 LAB — BASIC METABOLIC PANEL
ANION GAP: 5 (ref 5–15)
BUN: 28 mg/dL — ABNORMAL HIGH (ref 6–20)
CHLORIDE: 104 mmol/L (ref 101–111)
CO2: 26 mmol/L (ref 22–32)
CREATININE: 0.73 mg/dL (ref 0.61–1.24)
Calcium: 8.2 mg/dL — ABNORMAL LOW (ref 8.9–10.3)
GFR calc non Af Amer: >60 mL/min (ref >60)
Glucose, Bld: 168 mg/dL — ABNORMAL HIGH (ref 65–99)
POTASSIUM: 4.1 mmol/L (ref 3.5–5.1)
SODIUM: 135 mmol/L (ref 135–145)

## 2017-03-11 LAB — LEGIONELLA PNEUMOPHILA SEROGP 1 UR AG: L. pneumophila Serogp 1 Ur Ag: NEGATIVE

## 2017-03-11 LAB — PROCALCITONIN: Procalcitonin: 1.02 ng/mL

## 2017-03-11 LAB — TROPONIN I
Troponin I: 7.86 ng/mL (ref <0.03)
Troponin I: 8.21 ng/mL (ref <0.03)
Troponin I: 6.23 ng/mL (ref <0.03)

## 2017-03-11 LAB — HEMOGLOBIN A1C
HEMOGLOBIN A1C: 6.2 % — AB (ref 4.8–5.6)
Mean Plasma Glucose: 131 mg/dL

## 2017-03-11 LAB — TRIGLYCERIDES: TRIGLYCERIDES: 44 mg/dL (ref <150)

## 2017-03-11 MED ORDER — METHYLPREDNISOLONE SODIUM SUCC 125 MG IJ SOLR
60.0000 mg | Freq: Two times a day (BID) | INTRAMUSCULAR | Status: DC
  Administered 2017-03-11 – 2017-03-12 (×3): 60 mg via INTRAVENOUS
  Filled 2017-03-11 (×4): qty 0.96

## 2017-03-11 NOTE — Progress Notes (Signed)
PT demonstrates verbal and hands on understanding of Flutter device.

## 2017-03-11 NOTE — Consult Note (Signed)
Cardiology Consult Note  Admit date: 03/09/2017 Name: [name] 71 y.o.  male DOB:  09/02/1946 MRN:  [id number]  Today's date:  03/11/2017  Referring Physician:    [place]  Primary Physician:   Ardith Dark FNP  Reason for Consultation:    Abnormal troponin   IMPRESSIONS: 1.  Abnormal troponin due to non-STEMI occurring in the setting of acute respiratory failure and severe anemia 2.  Ischemic cardiomyopathy with known total occlusion of the right coronary artery and circumflex with moderate LAD disease and mid diagonal disease noted at catheterization in 2016 3.  COPD 4.  Coronary artery disease 5.  History of paroxysmal atrial fibrillation currently on amiodarone therapy 6.  Significant anemia 7.  Long-term use of anticoagulation 8.  Acute respiratory failure 9.  History of abdominal aneurysm and femoral aneurysm repair  RECOMMENDATION: His predominant symptom appears to be acute respiratory failure with pneumonia and he appears to the approaching where he can be extubated.  He was just catheterized about a year and a half ago.  I don't think nuclear imaging will be helpful in him and he does have known occlusion of 2 arteries that may account for the troponin elevation in the setting of demand ischemia.   About the only way that he'll be able to be evaluated will be with repeat catheterization and I would reserve this.  He has recurrent symptoms.  He should resume his medications and needs to have the anemia worked up.   He has not had any recurrence of atrial fibrillation.  He does have significant anemia that will need to be addressed.  HISTORY: I was asked to see this 71 year old male for evaluation of abnormal troponins occurring in the setting of acute respiratory failure.  The patient has 2 medical records.  I reviewed the old medical record which is under a different number and which lists a significant part of the history not obtained in the noted  below in the past history social history and family history.  The patient presented with acute respiratory failure and had to be intubated.  His initial troponin was not very high but he is subsequent developed significant troponin elevations.  He has a history of myocardial infarction in the past and had stenting of both the right coronary artery and circumflex.  He was catheterized in 2016 at which point in time he was found to have occluded his right coronary artery and circumflex in the mid portions and had moderate LAD disease and disease noted in the midportion of the diagonal branch.  He evidently was treated medically.  He also has had paroxysmal atrial fibrillation and is on amiodarone as well as chronic anticoagulation.  He presented with acute respiratory failure and was emergently intubated.  He was in sinus rhythm on admission but subsequently developed elevated troponins.  He is currently alert and able to write some answers to questions but with difficulty and denies any chest pain.  He states that he has been rattling.  He states that he has  stop smoking.  Past Medical History:  Diagnosis Date  . AAA (abdominal aortic aneurysm) without rupture ([place])   . CHF (congestive heart failure) ([place])   . COPD (chronic obstructive pulmonary disease) ([place])   . Coronary artery disease   . Hypertension   . Renal artery dissection [facility])       Surgical history:  Reviewed and is present in the other chart under a different medical record number  Allergies:  is allergic to aspirin and ibuprofen.   Medications: Prior to Admission medications   Medication Sig Start Date End Date Taking? Authorizing Provider  amiodarone (PACERONE) 200 MG tablet Take 200 mg by mouth daily.   Yes [provider]  aspirin EC 81 MG tablet Take 81 mg by mouth daily.   Yes [provider]  atorvastatin (LIPITOR) 80 MG tablet Take 80 mg by mouth every evening.   Yes [provider]   budesonide-formoterol (SYMBICORT) 160-4.5 MCG/ACT inhaler Inhale 2 puffs into the lungs 2 (two) times daily.   Yes [provider]  escitalopram (LEXAPRO) 20 MG tablet Take 20 mg by mouth daily.   Yes [provider]  fluticasone furoate-vilanterol (BREO ELLIPTA) 100-25 MCG/INH AEPB Inhale 1 puff into the lungs daily.   Yes [provider]  lisinopril (PRINIVIL,ZESTRIL) 10 MG tablet Take 10 mg by mouth daily.   Yes [provider]  metoprolol tartrate (LOPRESSOR) 25 MG tablet Take 25 mg by mouth 2 (two) times daily.   Yes [provider]  mirtazapine (REMERON SOL-TAB) 30 MG disintegrating tablet Take 30 mg by mouth at bedtime.   Yes [provider]  nitroGLYCERIN (NITROSTAT) 0.4 MG SL tablet Place 0.4 mg under the tongue every 5 (five) minutes as needed for chest pain.   Yes [provider]  oxyCODONE (OXY IR/ROXICODONE) 5 MG immediate release tablet Take 5 mg by mouth 2 (two) times daily as needed for severe pain.   Yes [provider]  tiotropium (SPIRIVA HANDIHALER) 18 MCG inhalation capsule Place 18 mcg into inhaler and inhale daily.   Yes [provider]  tiZANidine (ZANAFLEX) 4 MG tablet Take 4-8 mg by mouth See admin instructions. Take 8 mg every morning  Take 4 mg at bedtime   Yes [provider]  warfarin (COUMADIN) 2.5 MG tablet Take 2.5 mg by mouth daily.   Yes [provider]    Family History: Reviewed and is present under the old chart under a different medical record number  Social History:  Daughter reports he quit smoking recently.  Currently retired lives with 2 other folks   Review of Systems: Not obtainable due to intubation at the present time.  Physical Exam: BP 111/69   Pulse 72   Temp 98.4 F (36.9 C) (Oral)   Resp 20   Ht 5\' 5"  (1.651 m)   Wt 65 kg (143 lb 4.8 oz)   SpO2 98%   BMI 23.85 kg/m   General appearance: He is a middle-age male who is awake  intubated and able to write answers to some questions with difficulty in no acute distress Head: Normocephalic, without obvious abnormality, atraumatic Eyes: conjunctivae/corneas clear. PERRL, EOM's intact. Fundi not examined Neck: no adenopathy, no carotid bruit, no JVD and supple, symmetrical, trachea midline Lungs: Rhonchi present bilaterally Heart: regular rate and rhythm, S1, S2 normal, no murmur, click, rub or gallop Abdomen: soft, non-tender; bowel sounds normal; no masses,  no organomegaly Rectal: deferred Extremities: extremities normal, atraumatic, no cyanosis or edema Pulses: 2+ and symmetric Skin: Skin color, texture, turgor normal. No rashes or lesions Neurologic: Grossly normal Psych: Alert and oriented x 3 Labs: CBC  Recent Labs  03/11/17 0439  WBC 9.5  RBC 3.25*  HGB 8.0*  HCT 26.6*  PLT 231  MCV 81.8  MCH 24.6*  MCHC 30.1  RDW 16.3*  LYMPHSABS 0.4*  MONOABS 0.3  EOSABS 0.0  BASOSABS 0.0   CMP   Recent Labs  03/09/17 2258  03/11/17 0439  NA 136  < > 135  K 4.7  < > 4.1  CL 103  < > 104  CO2 25  < > 26  GLUCOSE 297*  < > 168*  BUN 16  < > 28*  CREATININE 1.06  < > 0.73  CALCIUM 8.6*  < > 8.2*  PROT 7.0  --   --   ALBUMIN 3.2*  --   --   AST 24  --   --   ALT 14*  --   --   ALKPHOS 88  --   --   BILITOT 0.4  --   --   GFRNONAA >60  < > >60  GFRAA >60  < > >60  < > = values in this interval not displayed. BNP (last 3 results) BNP    Component Value Date/Time   BNP 412.2 (H) 03/10/2017 0147  Cardiac Panel (last 3 results)  Recent Labs  03/09/17 2258 03/11/17 0439 03/11/17 1032  TROPONINI 0.08* 7.86* 8.21*     Radiology:  Cardiomegaly, right mid lung air space consistent with pneumonia, changes of interstitial fibrosis noted. ECHO: Personally reviewed.  The patient has akinesis of the lateral wall and hypokinesis inferiorly with a hyperdynamic anterior wall and septum.  Although the other reader read the echo as an EF of 55% it  looks to me like it is more in the 40-45% range.  EKG: Normal sinus rhythm, nonspecific ST changes laterally Independently reviewed by me  Signed:  W. Doristine Church MD [facility]   Cardiology Consultant  03/11/2017, 3:22 PM

## 2017-03-11 NOTE — Progress Notes (Signed)
PULMONARY / CRITICAL CARE MEDICINE   Name: [name] MRN: [id number] DOB: 30-Aug-1946    ADMISSION DATE:  03/09/2017 CHIEF COMPLAINT:  Respiratory distress  BRIEF SUMMARY:  [name] admitted 7/2 with complaints of shortness of breath.  He was unresponsive on arrival to the ER and was intubated.  The patient was noted to have vomited with concern for possible aspiration. CXR demonstrated RML opacity.  EKG with incomplete LBBB.  He was started on empiric abx and admitted to ICU.  7/4 noted to have elevated troponin 7.86 > 8.21).    PMH:  CAD, AAA w/o rupture, persistent afib with hx RVR (on coumadin w/ subtherapeutic INR), COPD, bronchiectasis, ischemic cardiomyopathy, femoral artery dissection, illiac a. anneurysm   SUBJECTIVE:  RN reports pt becomes agitated / uncomfortable when sedation is off.  Remains on low dose propofol / fentanyl for comfort.  Pt awake / writing notes on vent.  Denies SOB, chest pain.   VITAL SIGNS: BP 111/69   Pulse 72   Temp 98.4 F (36.9 C) (Oral)   Resp 20   Ht 5\' 5"  (1.651 m)   Wt 143 lb 4.8 oz (65 kg)   SpO2 99%   BMI 23.85 kg/m   HEMODYNAMICS:    VENTILATOR SETTINGS: Vent Mode: PRVC FiO2 (%):  [30 %-40 %] 30 % Set Rate:  [20 bmp] 20 bmp Vt Set:  [550 mL] 550 mL PEEP:  [5 cmH20] 5 cmH20 Pressure Support:  [10 cmH20-15 cmH20] 15 cmH20 Plateau Pressure:  [14 cmH20-19 cmH20] 14 cmH20  INTAKE / OUTPUT: I/O last 3 completed shifts: In: 3084.5 [I.V.:749.5; NG/GT:1035; IV Piggyback:1300] Out: 0321 [Urine:1122]  PHYSICAL EXAMINATION: General: elderly male in NAD on vent  HEENT: MM pink/moist, ETT PSY: calm/appropriate, writing notes  Neuro: Awake, alert, communicates appropriately, MAE CV: s1s2 rrr, no m/r/g PULM: even/non-labored, lungs bilaterally clear, diminished bases  YY:QMGN, non-tender, bsx4 active  Extremities: warm/dry, no edema  Skin: no rashes or lesions   LABS:  BMET  Recent Labs Lab 03/09/17 2258 03/10/17 0533  03/11/17 0439  NA 136 137 135  K 4.7 3.6 4.1  CL 103 106 104  CO2 25 26 26   BUN 16 15 28*  CREATININE 1.06 0.89 0.73  GLUCOSE 297* 107* 168*    Electrolytes  Recent Labs Lab 03/09/17 2258 03/10/17 0147 03/10/17 0533 03/11/17 0439  CALCIUM 8.6*  --  7.9* 8.2*  MG  --  2.3  --   --     CBC  Recent Labs Lab 03/09/17 2258 03/10/17 0533 03/11/17 0439  WBC 11.1* 6.6 9.5  HGB 9.6* 7.9* 8.0*  HCT 33.6* 27.9* 26.6*  PLT 321 197 231    Coag's  Recent Labs Lab 03/10/17 0533  INR 1.14    Sepsis Markers  Recent Labs Lab 03/09/17 2315 03/10/17 0147 03/10/17 0533 03/11/17 0439  LATICACIDVEN 1.9 2.2* 2.4*  --   PROCALCITON  --  0.14 0.69 1.02    ABG  Recent Labs Lab 03/10/17 0020 03/10/17 0244  PHART 7.186* 7.319*  PCO2ART 71.1* 52.4*  PO2ART 138.0* 128.0*    Liver Enzymes  Recent Labs Lab 03/09/17 2258  AST 24  ALT 14*  ALKPHOS 88  BILITOT 0.4  ALBUMIN 3.2*    Cardiac Enzymes  Recent Labs Lab 03/09/17 2258 03/11/17 0439 03/11/17 1032  TROPONINI 0.08* 7.86* 8.21*    Glucose  Recent Labs Lab 03/10/17 1621 03/10/17 2046 03/10/17 2346 03/11/17 0353 03/11/17 0826 03/11/17 1150  GLUCAP 151* 149* 169* 159*  139* 128*    Imaging No results found.   STUDIES:  CXR 7/2 >> ETT 6 cm above carina, enteric tube below diaphragm; RML opacity concerning for pneumonia, underlying interstitial prominence and bilateral fibrosis; mild cardiomegaly EKG 7/2 >> SR 76, incomplete LBBB, STD CTA Chest 7/3 >> no PE, dependent consolidations with mucous plugging could be PNA vs: aspiration. Small bilateral pleural effusions. 12 mm right upper lobe ground-glass nodule and 7 mm left lower lobe solid nodule. ECHO 7/3 >> LV moderately dilated, LVEF 50-55%, regional wall motion abnormalities can not be excluded, diastolic function with indeterminate LV filling pressure, moderate MR, LA severely dilated, RA mildly dilated, dilated IVC (on  vent)  CULTURES: BCx2 7/2 >> GPC's in clusters >>  BCID 7/2 >> staph species Tracheal aspirate 7/2 >> U. Strep Antigen 7/3 >> negative   ANTIBIOTICS: Ceftriaxone 7/2 >> azithromcyin 7/2 >> Clindamycin 7/2 >>7/2  SIGNIFICANT EVENTS: 7/02  Admit and intubation 7/04  Elevated troponin > cardiology consulted   LINES/TUBES: ETT 7/2 >> OGT 7/2 >> Foley 7/3 >> PIV x 3   DISCUSSION: 71yo M with complex medical hx admitted with concern for CAP vs aspiration PNA > respiratory failure requiring intubation.   ASSESSMENT / PLAN:  PULMONARY A: Acute Hypoxic Respiratory Failure - in setting of RML opacity / PNA.  CAP vs Aspiration.  CTA negative for PE Acute COPD Exacerbation Hx bronchiectasis, COPD  Tobacco Abuse   P:   PRVC 8cc/kg  Wean PEEP / FiO2 for sats > 90% Duoneb Q4  See ID  Intermittent CXR Continue Brovana + Pulmicort  Reduce Solumedrol to 60 mg IV Q12 Daily WUA / SBT   CARDIOVASCULAR A:  Elevated Troponin / NSTEMI -  Hx systolic HF, HLD, HTN, ischemic cardiomyopathy with EF 35-40% with diffuse hypokinesis, 5/18 s/8 AAA repair, persistent Afib on coumadin. INR 1.14. MAP 77 P:  EKG now Trend troponin to peak Cardiology consultation requested  ASA   RENAL A:   No acute issues. Cr WNL P:   Trend BMP / urinary output Replace electrolytes as indicated Avoid nephrotoxic agents, ensure adequate renal perfusion  GASTROINTESTINAL A:   At Risk Protein Calorie Malnutrition P:   NPO  Pepcid for SUP  Begin TF  HEMATOLOGIC A:   Anemia - 9.6 > 8.0 P:  Trend CBC  Heparin + SCD's for DVT prophylaxis   Trend CBC Heparin SQ and SCDs FOBT if stools  Consider 1U PRBC  INFECTIOUS A:   RML opacity - ? Aspiration PNA Hypothermia - rectal temp 95.7, temp this AM to 98.2. UA glucose >500, no signs infection P:   Trend WBC / fever curve  Follow cultures as above  ABX as above, D3/x   ENDOCRINE A:   Hyperglycemia P:   SSI   NEUROLOGIC A:    Acute encephalopathy  Chronic pain  ? ETOH use - daughter states he stopped drinking ETOH years ago - was following commands in ED prior to intubation and post intubation prior to sedation  P:   RASS goal: 0 to -1  Propofol gtt for sedation  Fentanyl gtt for pain  PT efforts as able  Folate, MVI, Thiamine QD Follow for possible withdrawal symptoms  Daily WUA / SBT  FAMILY  - Updates: Patient's daughter, Corrado Hymon [phone], updated in person 7/4 on rounds.  Patient prefers to be called "Bud".   - Inter-disciplinary family meet or Palliative Care meeting due by:  03/16/17  CC Time: 71 minutes  Noe Gens,  NP-C [facility] Pgr: [phone] or if no answer [phone] 03/11/2017, 1:16 PM

## 2017-03-11 NOTE — Procedures (Signed)
Extubation Procedure Note  Patient Details:   Name: Brenton Joines DOB: [birth year] MRN: [id number]   Airway Documentation:     Evaluation  O2 sats: stable throughout Complications: No apparent complications Patient did tolerate procedure well. Bilateral Breath Sounds: Clear, Diminished   Yes  Baldwin [place] Nannette 03/11/2017, 4:05 PM  Positive cuff leak pre extubation. Placed on 2 lpm [place] post extubation.

## 2017-03-12 ENCOUNTER — Inpatient Hospital Stay (HOSPITAL_COMMUNITY): Payer: Medicare Other

## 2017-03-12 DIAGNOSIS — J432 Centrilobular emphysema: Secondary | ICD-10-CM

## 2017-03-12 DIAGNOSIS — R748 Abnormal levels of other serum enzymes: Secondary | ICD-10-CM

## 2017-03-12 LAB — CBC
HEMATOCRIT: 29.3 % — AB (ref 39.0–52.0)
HEMOGLOBIN: 8.7 g/dL — AB (ref 13.0–17.0)
MCH: 24.2 pg — ABNORMAL LOW (ref 26.0–34.0)
MCHC: 29.7 g/dL — ABNORMAL LOW (ref 30.0–36.0)
MCV: 81.6 fL (ref 78.0–100.0)
Platelets: 236 10*3/uL (ref 150–400)
RBC: 3.59 MIL/uL — ABNORMAL LOW (ref 4.22–5.81)
RDW: 16.4 % — ABNORMAL HIGH (ref 11.5–15.5)
WBC: 8.1 10*3/uL (ref 4.0–10.5)

## 2017-03-12 LAB — GLUCOSE, CAPILLARY
GLUCOSE-CAPILLARY: 109 mg/dL — AB (ref 65–99)
GLUCOSE-CAPILLARY: 138 mg/dL — AB (ref 65–99)
GLUCOSE-CAPILLARY: 177 mg/dL — AB (ref 65–99)
GLUCOSE-CAPILLARY: 95 mg/dL (ref 65–99)
Glucose-Capillary: 124 mg/dL — ABNORMAL HIGH (ref 65–99)
Glucose-Capillary: 140 mg/dL — ABNORMAL HIGH (ref 65–99)

## 2017-03-12 LAB — BASIC METABOLIC PANEL
ANION GAP: 7 (ref 5–15)
BUN: 27 mg/dL — ABNORMAL HIGH (ref 6–20)
CHLORIDE: 103 mmol/L (ref 101–111)
CO2: 28 mmol/L (ref 22–32)
Calcium: 8.4 mg/dL — ABNORMAL LOW (ref 8.9–10.3)
Creatinine, Ser: 0.65 mg/dL (ref 0.61–1.24)
GFR calc non Af Amer: >60 mL/min (ref >60)
Glucose, Bld: 138 mg/dL — ABNORMAL HIGH (ref 65–99)
Potassium: 4.5 mmol/L (ref 3.5–5.1)
Sodium: 138 mmol/L (ref 135–145)

## 2017-03-12 LAB — MAGNESIUM: Magnesium: 2.2 mg/dL (ref 1.7–2.4)

## 2017-03-12 LAB — PHOSPHORUS: PHOSPHORUS: 3.6 mg/dL (ref 2.5–4.6)

## 2017-03-12 MED ORDER — FLUTICASONE FUROATE-VILANTEROL 100-25 MCG/INH IN AEPB
1.0000 | INHALATION_SPRAY | Freq: Every day | RESPIRATORY_TRACT | Status: DC
  Administered 2017-03-14 – 2017-03-17 (×4): 1 via RESPIRATORY_TRACT
  Filled 2017-03-12: qty 28

## 2017-03-12 MED ORDER — VITAMIN B-1 100 MG PO TABS
100.0000 mg | ORAL_TABLET | Freq: Every day | ORAL | Status: DC
  Administered 2017-03-13 – 2017-03-17 (×4): 100 mg via ORAL
  Filled 2017-03-12 (×4): qty 1

## 2017-03-12 MED ORDER — ORAL CARE MOUTH RINSE
15.0000 mL | Freq: Two times a day (BID) | OROMUCOSAL | Status: DC
  Administered 2017-03-12 – 2017-03-16 (×3): 15 mL via OROMUCOSAL

## 2017-03-12 MED ORDER — ASPIRIN 81 MG PO CHEW
81.0000 mg | CHEWABLE_TABLET | Freq: Every day | ORAL | Status: DC
  Administered 2017-03-12 – 2017-03-17 (×5): 81 mg via ORAL
  Filled 2017-03-12 (×6): qty 1

## 2017-03-12 MED ORDER — LACTATED RINGERS IV BOLUS (SEPSIS): 1000.0000 mL | Freq: Once | INTRAVENOUS | Status: DC

## 2017-03-12 MED ORDER — MORPHINE SULFATE (PF) 4 MG/ML IV SOLN
4.0000 mg | Freq: Once | INTRAVENOUS | Status: AC
  Administered 2017-03-12: 4 mg via INTRAVENOUS

## 2017-03-12 MED ORDER — FUROSEMIDE 10 MG/ML IJ SOLN
40.0000 mg | Freq: Four times a day (QID) | INTRAMUSCULAR | Status: AC
  Administered 2017-03-12 (×2): 40 mg via INTRAVENOUS
  Filled 2017-03-12: qty 4

## 2017-03-12 MED ORDER — ATORVASTATIN CALCIUM 80 MG PO TABS
80.0000 mg | ORAL_TABLET | Freq: Every day | ORAL | Status: DC
  Administered 2017-03-13 – 2017-03-15 (×3): 80 mg via ORAL
  Filled 2017-03-12 (×6): qty 1

## 2017-03-12 MED ORDER — TIOTROPIUM BROMIDE MONOHYDRATE 18 MCG IN CAPS
18.0000 ug | ORAL_CAPSULE | Freq: Every day | RESPIRATORY_TRACT | Status: DC
  Administered 2017-03-13 – 2017-03-17 (×5): 18 ug via RESPIRATORY_TRACT
  Filled 2017-03-12 (×2): qty 5

## 2017-03-12 MED ORDER — MORPHINE SULFATE (PF) 4 MG/ML IV SOLN
INTRAVENOUS | Status: AC
  Administered 2017-03-12: 15:00:00
  Filled 2017-03-12: qty 1

## 2017-03-12 MED ORDER — AMIODARONE HCL 200 MG PO TABS
200.0000 mg | ORAL_TABLET | Freq: Every day | ORAL | Status: DC
  Administered 2017-03-12 – 2017-03-17 (×6): 200 mg via ORAL
  Filled 2017-03-12 (×6): qty 1

## 2017-03-12 MED ORDER — HYDRALAZINE HCL 20 MG/ML IJ SOLN: 10.0000 mg | INTRAMUSCULAR | Status: DC | PRN

## 2017-03-12 MED ORDER — METOPROLOL SUCCINATE ER 25 MG PO TB24
12.5000 mg | ORAL_TABLET | Freq: Every day | ORAL | Status: DC
  Administered 2017-03-12 – 2017-03-17 (×6): 12.5 mg via ORAL
  Filled 2017-03-12 (×6): qty 1

## 2017-03-12 MED ORDER — FUROSEMIDE 10 MG/ML IJ SOLN
INTRAMUSCULAR | Status: AC
  Administered 2017-03-12: 40 mg
  Filled 2017-03-12: qty 4

## 2017-03-12 MED ORDER — MOMETASONE FURO-FORMOTEROL FUM 200-5 MCG/ACT IN AERO
2.0000 | INHALATION_SPRAY | Freq: Two times a day (BID) | RESPIRATORY_TRACT | Status: DC
  Filled 2017-03-12: qty 8.8

## 2017-03-12 NOTE — Progress Notes (Signed)
[facility] Attending:  I have seen and examined the patient with nurse practitioner/resident and agree with the note above.  We formulated the plan together and I elicited the following history.    S: extubated yesterday, making urine  O: Vitals:   03/12/17 0745 03/12/17 0800 03/12/17 0921 03/12/17 1000  BP:  124/68 (!) 121/108 (!) 146/93  Pulse:  74 83 92  Resp:  12  18  Temp:      TempSrc:      SpO2: 99% 100%  97%  Weight:      Height:       3L [place]  Gen: chronically ill appearing HENT: OP clear, NCAT  PULM: No wheezing bilaterally, poor air movement though CV: RRR, no mgr, trace edema GI: BS+, soft, nontender Derm: no cyanosis or rash Psyche: normal mood and affect   BMET    Component Value Date/Time   NA 138 03/12/2017 0356   K 4.5 03/12/2017 0356   CL 103 03/12/2017 0356   CO2 28 03/12/2017 0356   GLUCOSE 138 (H) 03/12/2017 0356   BUN 27 (H) 03/12/2017 0356   CREATININE 0.65 03/12/2017 0356   CALCIUM 8.4 (L) 03/12/2017 0356   GFRNONAA >60 03/12/2017 0356   GFRAA >60 03/12/2017 0356   CBC    Component Value Date/Time   WBC 8.1 03/12/2017 0356   RBC 3.59 (L) 03/12/2017 0356   HGB 8.7 (L) 03/12/2017 0356   HCT 29.3 (L) 03/12/2017 0356   PLT 236 03/12/2017 0356   MCV 81.6 03/12/2017 0356   MCH 24.2 (L) 03/12/2017 0356   MCHC 29.7 (L) 03/12/2017 0356   RDW 16.4 (H) 03/12/2017 0356   LYMPHSABS 0.4 (L) 03/11/2017 0439   MONOABS 0.3 03/11/2017 0439   EOSABS 0.0 03/11/2017 0439   BASOSABS 0.0 03/11/2017 0439   Impression/Plan: NSTEMI: likely demand ischemia in setting of COPD exacerbation, cardiology recommending ACE inhibitor, will need outpatient follow up COPD exacerbation: continue solumedrol, ceftriaxone, brovana, scheduled duoneb; tomorrow: change to prednisone Take 40mg  po daily for 3 days, then take 30mg  po daily for 3 days, then take 20mg  po daily for two days, then take 10mg  po daily for 2 days Anemia: check occult blood stool, transfuse if Hgb < 7gm/dL  at this point, check reticulocyte count Coag neg staph bacteremia: suspect contaminant, repeat cultures  Roselie Awkward, MD [place] [facility] Pager: [phone] Cell: [phone] After 3pm or if no response, call [phone]

## 2017-03-12 NOTE — Progress Notes (Signed)
PULMONARY / CRITICAL CARE MEDICINE   Name: [name] MRN: [id number] DOB: December 15, 1945    ADMISSION DATE:  03/09/2017  Brief summary: [name] admitted 7/2 with complaints of shortness of breath.  He was unresponsive on arrival to the ER and was intubated.  The patient was noted to have vomited with concern for possible aspiration. CXR demonstrated RML opacity.  EKG with incomplete LBBB.  He was started on empiric abx and admitted to ICU.  7/4 noted to have elevated troponin 7.86 > 8.21).    SUBJECTIVE:  No acute overnight events.   VITAL SIGNS: BP 106/64   Pulse 73   Temp 98.3 F (36.8 C) (Oral)   Resp 12   Ht 5\' 5"  (1.651 m)   Wt 138 lb 14.2 oz (63 kg)   SpO2 100%   BMI 23.11 kg/m   HEMODYNAMICS:    VENTILATOR SETTINGS: Vent Mode: CPAP;PSV FiO2 (%):  [30 %-40 %] 30 % Set Rate:  [20 bmp] 20 bmp Vt Set:  [550 mL] 550 mL PEEP:  [5 cmH20] 5 cmH20 Pressure Support:  [0 cmH20-15 cmH20] 0 cmH20 Plateau Pressure:  [14 cmH20-15 cmH20] 14 cmH20  INTAKE / OUTPUT: I/O last 3 completed shifts: In: 1501.8 [I.V.:416.8; NG/GT:935; IV Piggyback:150] Out: 1670 [Urine:1670]  PHYSICAL EXAMINATION: General: 71yo M sitting up comfortably with 3L [place]  Neuro:  AAOx3, responds to commands HEENT:  AT/[place], no scleral icterus, [place] in place Cardiovascular:  RRR, no JVD or edema Lungs:  Diminished bilaterally with some minimal crackles at the bases, but overall good air movement Abdomen:  Soft, NTND Musculoskeletal:  No edema or gross deformities Extremities: SCDs in place and no edema  LABS:  BMET  Recent Labs Lab 03/10/17 0533 03/11/17 0439 03/12/17 0356  NA 137 135 138  K 3.6 4.1 4.5  CL 106 104 103  CO2 26 26 28   BUN 15 28* 27*  CREATININE 0.89 0.73 0.65  GLUCOSE 107* 168* 138*    Electrolytes  Recent Labs Lab 03/10/17 0147 03/10/17 0533 03/11/17 0439 03/12/17 0356  CALCIUM  --  7.9* 8.2* 8.4*  MG 2.3  --   --  2.2  PHOS  --   --   --  3.6    CBC  Recent  Labs Lab 03/10/17 0533 03/11/17 0439 03/12/17 0356  WBC 6.6 9.5 8.1  HGB 7.9* 8.0* 8.7*  HCT 27.9* 26.6* 29.3*  PLT 197 231 236    Coag's  Recent Labs Lab 03/10/17 0533  INR 1.14    Sepsis Markers  Recent Labs Lab 03/09/17 2315 03/10/17 0147 03/10/17 0533 03/11/17 0439  LATICACIDVEN 1.9 2.2* 2.4*  --   PROCALCITON  --  0.14 0.69 1.02    ABG  Recent Labs Lab 03/10/17 0020 03/10/17 0244  PHART 7.186* 7.319*  PCO2ART 71.1* 52.4*  PO2ART 138.0* 128.0*    Liver Enzymes  Recent Labs Lab 03/09/17 2258  AST 24  ALT 14*  ALKPHOS 88  BILITOT 0.4  ALBUMIN 3.2*    Cardiac Enzymes  Recent Labs Lab 03/11/17 0439 03/11/17 1032 03/11/17 1650  TROPONINI 7.86* 8.21* 6.23*    Glucose  Recent Labs Lab 03/11/17 0826 03/11/17 1150 03/11/17 1600 03/11/17 2041 03/12/17 0019 03/12/17 0422  GLUCAP 139* 128* 131* 136* 124* 140*    Imaging Dg Chest Port 1 View  Result Date: 03/12/2017 CLINICAL DATA:  Respiratory failure. EXAM: PORTABLE CHEST 1 VIEW COMPARISON:  No prior. FINDINGS: Mild mediastinal prominence most likely prominent great vessels. Cardiomegaly with  pulmonary venous congestion and interstitial prominence suggesting mild CHF. Bibasilar atelectasis and infiltrates/edema. Tiny bilateral pleural effusions cannot be excluded. No pneumothorax. IMPRESSION: 1. Congestive heart failure bilateral interstitial edema. Tiny bilateral pleural effusions cannot be excluded. 2.  Bibasilar atelectasis and infiltrates/edema. Electronically Signed   By: Marcello Moores  Register   On: 03/12/2017 06:20   STUDIES: CXR 7/2 >> ETT 6 cm above carina, enteric tube below diaphragm; RML opacity concerning for pneumonia, underlying interstitial prominence and bilateral fibrosis; mild cardiomegaly CXR 7/5>> Bilateral interstitial edema, bibasilar atelectasis EKG 7/2 >> SR 76, incomplete LBBB, STD CTA Chest 7/3 >> no PE, dependent consolidations with mucous plugging could be PNA vs:  aspiration. Small bilateral pleural effusions. 12 mm right upper lobe ground-glass nodule and 7 mm left lower lobe solid nodule. ECHO 7/3 >> LV moderately dilated, LVEF 50-55%, regional wall motion abnormalities can not be excluded, diastolic function with indeterminate LV filling pressure, moderate MR, LA severely dilated, RA mildly dilated, dilated IVC (on vent)  CULTURES: BCx2 7/2 >> GPC's in clusters >> Coagulase Negative Staph  BCID 7/2 >> staph species>> Tracheal aspirate 7/2 >> U. Strep Antigen 7/3 >> negative   ANTIBIOTICS: Ceftriaxone 7/2 >> azithromcyin 7/2 >>7/4 Clindamycin 7/2 >>7/2  SIGNIFICANT EVENTS: 7/02 Admit and intubation 7/04  Elevated troponin > cardiology consulted 7/04 Extubated  LINES/TUBES: ETT 7/2 >>7/4 OGT 7/2 >>7/4 Foley 7/3>> PIV x 3   DISCUSSION: 71yo M with complex medical hx admitted with concern for CAP vs aspiration PNA > respiratory failure requiring intubation.   ASSESSMENT / PLAN:  PULMONARY A: Acute Hypoxic Respiratory Failure - in setting of RML opacity / PNA.  CAP vs Aspiration.  CTA negative for PE Acute COPD Exacerbation Hx bronchiectasis, COPD  Tobacco Abuse   Extubated 7/4, currently on 3L [place] Repeat CXR, appears to have improved P:   Continue supplemental o2 w/ [place] for sats >90% Duoneb Q4  See ID  Continue Brovana + Pulmicort  Continue Solumedrol to 60 mg IV Q12  CARDIOVASCULAR A:  Elevated Troponin / NSTEMI- Seen by Cardiology 7/4 who felt elevations in troponin related to known occulusions in the setting of demand ischemia. Not recommending cath at this time.  Repeat echo showing EF 40-45% and akinesis of the lateral wall and hypokinesis inferiorly with a hyperdynamic anterior wall and septum. Troponin trended down from 8.3 to 6.23 Paroxsysmal afib, stable since admission: taking amiodarone and coumadin. INR subtherapeutic to 1.14 P:  Cardiology following; appreciate recommendation ASA  Hold coumadin until  negative FOBT  RENAL A:  No acute issues. Cr WNL P: Trend BMP / urinary output Replace electrolytes as indicated Avoid nephrotoxic agents, ensure adequate renal perfusion  GASTROINTESTINAL A:  At Risk Protein Calorie Malnutrition P: NPO  Pepcid for SUP  Begin TF  HEMATOLOGIC A:  Anemia - 9.6 > 8.0>8.1 P: Trend CBC  Heparin + SCD's for DVT prophylaxis  Trend CBC Heparin SQ and SCDs FOBT if stools  Consider 1U PRBC  INFECTIOUS A:  RML opacity - ? Aspiration PNA Hypothermia - rectal temp 95.7, temp this AM to 98.3. Cultures negative P: Trend WBC / fever curve  ABX as above, consider discontinuing  ENDOCRINE A:  Hyperglycemia P: SSI   NEUROLOGIC A:  Acute encephalopathy  Chronic pain  ? ETOH use - daughter states he stopped drinking ETOH years ago P: PT efforts as able  Folate, MVI, Thiamine QD Follow for possible withdrawal symptoms   FAMILY - Updates: Patient's daughter, Guiseppe Flanagan [phone], updated in person  7/4 on rounds. Patient prefers to be called "[name]".    Pulmonary and [place] Pager: [phone]  03/12/2017, 7:08 AM

## 2017-03-12 NOTE — Progress Notes (Signed)
Progress Note  Patient Name: [name] Date of Encounter: 03/12/2017  Primary Cardiologist: Dr [name]  Subjective   No chest pain of dyspnea  Inpatient Medications    Scheduled Meds: . albuterol  5 mg Nebulization Once  . budesonide (PULMICORT) nebulizer solution  0.5 mg Nebulization BID  . chlorhexidine gluconate (MEDLINE KIT)  15 mL Mouth Rinse BID  . folic acid  1 mg Per Tube Daily  . heparin  5,000 Units Subcutaneous Q8H  . insulin aspart  0-9 Units Subcutaneous Q4H  . ipratropium  0.5 mg Nebulization Once  . ipratropium-albuterol  3 mL Nebulization Q6H  . mouth rinse  15 mL Mouth Rinse QID  . methylPREDNISolone (SOLU-MEDROL) injection  60 mg Intravenous Q12H  . multivitamin with minerals  1 tablet Per Tube Daily  . thiamine  100 mg Intravenous Daily   Continuous Infusions: . sodium chloride    . cefTRIAXone (ROCEPHIN)  IV Stopped (03/11/17 2225)  . famotidine (PEPCID) IV Stopped (03/11/17 2306)   PRN Meds: sodium chloride, docusate   Vital Signs    Vitals:   03/12/17 0500 03/12/17 0600 03/12/17 0700 03/12/17 0745  BP: 127/77 106/64 114/70   Pulse: 70 73 77   Resp: _0 Temp:      TempSrc:      SpO2: 98% 100% 100% 99%  Weight: 63 kg (138 lb 14.2 oz)     Height:        Intake/Output Summary (Last 24 hours) at 03/12/17 0815 Last data filed at 03/12/17 0600  Gross per 24 hour  Intake            466.6 ml  Output             1075 ml  Net           -608.4 ml   Filed Weights   03/10/17 0500 03/11/17 0436 03/12/17 0500  Weight: 62.5 kg (137 lb 12.6 oz) 65 kg (143 lb 4.8 oz) 63 kg (138 lb 14.2 oz)    Telemetry    Sinus with pacs and 3 beats NSVT- Personally Reviewed   Physical Exam   GEN: No acute distress.   Neck: No JVD Cardiac: RRR, no murmurs, rubs, or gallops.  Respiratory: Clear to auscultation bilaterally. GI: Soft, nontender, non-distended  MS: No edema Neuro:  Nonfocal  Psych: Normal affect   Labs    Chemistry Recent  Labs Lab 03/09/17 2258 03/10/17 0533 03/11/17 0439 03/12/17 0356  NA 136 137 135 138  K 4.7 3.6 4.1 4.5  CL 103 106 104 103  CO2 _1 GLUCOSE 297* 107* 168* 138*  BUN 16 15 28* 27*  CREATININE 1.06 0.89 0.73 0.65  CALCIUM 8.6* 7.9* 8.2* 8.4*  PROT 7.0  --   --   --   ALBUMIN 3.2*  --   --   --   AST 24  --   --   --   ALT 14*  --   --   --   ALKPHOS 88  --   --   --   BILITOT 0.4  --   --   --   GFRNONAA >60 >60 >60 >60  GFRAA >60 >60 >60 >60  ANIONGAP _2 Hematology Recent Labs Lab 03/10/17 0533 03/11/17 0439 03/12/17 0356  WBC 6.6 9.5 8.1  RBC 3.30* 3.25* 3.59*  HGB 7.9* 8.0* 8.7*  HCT 27.9* 26.6* 29.3*  MCV 84.5 81.8 81.6  MCH 23.9* 24.6* 24.2*  MCHC 28.3* 30.1 29.7*  RDW 16.0* 16.3* 16.4*  PLT 197 231 236    Cardiac Enzymes Recent Labs Lab 03/09/17 2258 03/11/17 0439 03/11/17 1032 03/11/17 1650  TROPONINI 0.08* 7.86* 8.21* 6.23*    BNP Recent Labs Lab 03/10/17 0147  BNP 412.2*      Radiology    Dg Chest Port 1 View  Result Date: 03/12/2017 CLINICAL DATA:  Respiratory failure. EXAM: PORTABLE CHEST 1 VIEW COMPARISON:  No prior. FINDINGS: Mild mediastinal prominence most likely prominent great vessels. Cardiomegaly with pulmonary venous congestion and interstitial prominence suggesting mild CHF. Bibasilar atelectasis and infiltrates/edema. Tiny bilateral pleural effusions cannot be excluded. No pneumothorax. IMPRESSION: 1. Congestive heart failure bilateral interstitial edema. Tiny bilateral pleural effusions cannot be excluded. 2.  Bibasilar atelectasis and infiltrates/edema. Electronically Signed   By: Marcello Moores  Register   On: 03/12/2017 06:20    Patient Profile     71 y.o. male with past medical history of coronary artery disease, COPD, paroxysmal atrial fibrillation, ischemic cardiomyopathy admitted with respiratory failure felt possibly secondary to COPD with superimposed pneumonia or aspiration. Troponin elevated and cardiology  asked to evaluate. Echocardiogram shows ejection fraction 40-45%, moderate mitral regurgitation and biatrial enlargement. Note ejection fraction essentially unchanged compared to previous.  Assessment & Plan    1 non-ST elevation myocardial infarction-patient presented with respiratory failure felt possibly secondary to COPD with superimposed pneumonia or aspiration. His troponin increased to maximum of 8.21. He is now extubated and has not had chest pain. Would resume aspirin and statin. Add low-dose metoprolol. Resume ACE inhibitor later if blood pressure allows. This is felt likely to be a demand ischemia but fairly significant troponin elevation. Last catheterization 2016 showed occluded circumflex and right coronary artery. Moderate disease in LAD. Medical therapy recommended. Once he improves from his pneumonia/COPD he will need close follow-up in the office and possible repeat catheterization.  2 history of paroxysmal atrial fibrillation-patient remains in sinus rhythm. CHADSvasc 4. Would resume Coumadin. Would resume amiodarone 200 mg daily.  3 ischemic cardiomyopathy-resume low-dose beta blockade. Add ACE inhibitor later has blood pressure allows.  4 normocytic anemia-further workup per primary service.  5 COPD/pneumonia-per critical care medicine.     Signed, Kirk Ruths, MD  03/12/2017, 8:15 AM

## 2017-03-12 NOTE — Progress Notes (Signed)
[facility] PCCM  Came back to see patient this afternoon due to the sudden onset of dyspnea post eating.  He doesn't recall an aspiration event.  NTS suctioning not helpful.  Has rhonchi bilaterally, no wheezing, using accessory muscles, diaphoretic, tachycardic.  Will start BIPAP and check CXR now.   May need intubation. Give lasix empirically.   Daughter updated bedside by me  Additional CC time 40 minutes  Roselie Awkward, MD [place] [facility] Pager: [phone] Cell: [phone] After 3pm or if no response, call [phone]

## 2017-03-12 NOTE — Progress Notes (Deleted)
[facility] Progress Note Patient Name: Malikah Lakey DOB: May 29, 1946 MRN: [id number]   Date of Service  03/12/2017  HPI/Events of Note  Continued high pressor requirement with dropping CVP and UO, previous responded to fluid bolus.   eICU Interventions  Will give 1L bolus of fluid.      Intervention Category Major Interventions: Hypotension - evaluation and management  Bryar Dahms 03/12/2017, 12:08 AM

## 2017-03-13 ENCOUNTER — Encounter (HOSPITAL_COMMUNITY): Payer: Self-pay | Admitting: General Practice

## 2017-03-13 DIAGNOSIS — I214 Non-ST elevation (NSTEMI) myocardial infarction: Secondary | ICD-10-CM

## 2017-03-13 DIAGNOSIS — J9600 Acute respiratory failure, unspecified whether with hypoxia or hypercapnia: Secondary | ICD-10-CM

## 2017-03-13 DIAGNOSIS — J410 Simple chronic bronchitis: Secondary | ICD-10-CM

## 2017-03-13 DIAGNOSIS — J42 Unspecified chronic bronchitis: Secondary | ICD-10-CM

## 2017-03-13 HISTORY — DX: Acute respiratory failure, unspecified whether with hypoxia or hypercapnia: J96.00

## 2017-03-13 LAB — CULTURE, BLOOD (ROUTINE X 2)
SPECIAL REQUESTS: ADEQUATE
Special Requests: ADEQUATE

## 2017-03-13 LAB — GLUCOSE, CAPILLARY
GLUCOSE-CAPILLARY: 111 mg/dL — AB (ref 65–99)
Glucose-Capillary: 108 mg/dL — ABNORMAL HIGH (ref 65–99)
Glucose-Capillary: 111 mg/dL — ABNORMAL HIGH (ref 65–99)
Glucose-Capillary: 120 mg/dL — ABNORMAL HIGH (ref 65–99)
Glucose-Capillary: 123 mg/dL — ABNORMAL HIGH (ref 65–99)
Glucose-Capillary: 149 mg/dL — ABNORMAL HIGH (ref 65–99)

## 2017-03-13 LAB — HEPARIN LEVEL (UNFRACTIONATED): HEPARIN UNFRACTIONATED: 0.25 [IU]/mL — AB (ref 0.30–0.70)

## 2017-03-13 LAB — PROTIME-INR
INR: 1.07
PROTHROMBIN TIME: 13.9 s (ref 11.4–15.2)

## 2017-03-13 MED ORDER — SODIUM CHLORIDE 0.9 % IV SOLN: 250.0000 mL | INTRAVENOUS | Status: DC | PRN

## 2017-03-13 MED ORDER — PANTOPRAZOLE SODIUM 40 MG PO TBEC
40.0000 mg | DELAYED_RELEASE_TABLET | Freq: Every day | ORAL | Status: DC
  Administered 2017-03-13 – 2017-03-17 (×4): 40 mg via ORAL
  Filled 2017-03-13 (×4): qty 1

## 2017-03-13 MED ORDER — OXYCODONE HCL 5 MG PO TABS
10.0000 mg | ORAL_TABLET | ORAL | Status: DC | PRN
  Administered 2017-03-13: 5 mg via ORAL
  Administered 2017-03-14 – 2017-03-17 (×12): 10 mg via ORAL
  Filled 2017-03-13 (×13): qty 2

## 2017-03-13 MED ORDER — LOSARTAN POTASSIUM 25 MG PO TABS
25.0000 mg | ORAL_TABLET | Freq: Every day | ORAL | Status: DC
Start: 2017-03-13 — End: 2017-03-17
  Administered 2017-03-13 – 2017-03-17 (×4): 25 mg via ORAL
  Filled 2017-03-13 (×4): qty 1

## 2017-03-13 MED ORDER — METHYLPREDNISOLONE SODIUM SUCC 40 MG IJ SOLR
40.0000 mg | Freq: Two times a day (BID) | INTRAMUSCULAR | Status: DC
  Administered 2017-03-13 – 2017-03-15 (×6): 40 mg via INTRAVENOUS
  Filled 2017-03-13 (×6): qty 1

## 2017-03-13 MED ORDER — SODIUM CHLORIDE 0.9% FLUSH
3.0000 mL | Freq: Two times a day (BID) | INTRAVENOUS | Status: DC
  Administered 2017-03-13 – 2017-03-15 (×4): 3 mL via INTRAVENOUS

## 2017-03-13 MED ORDER — SODIUM CHLORIDE 0.9 % IV SOLN
INTRAVENOUS | Status: DC
  Administered 2017-03-15 (×2): via INTRAVENOUS

## 2017-03-13 MED ORDER — ASPIRIN 81 MG PO CHEW: 81.0000 mg | CHEWABLE_TABLET | ORAL | Status: DC

## 2017-03-13 MED ORDER — OXYCODONE HCL 5 MG PO TABS
5.0000 mg | ORAL_TABLET | ORAL | Status: DC | PRN
  Administered 2017-03-13: 5 mg via ORAL
  Filled 2017-03-13: qty 1

## 2017-03-13 MED ORDER — SODIUM CHLORIDE 0.9 % IV SOLN: INTRAVENOUS | Status: DC

## 2017-03-13 MED ORDER — HEPARIN (PORCINE) IN NACL 100-0.45 UNIT/ML-% IJ SOLN
800.0000 [IU]/h | INTRAMUSCULAR | Status: DC
  Administered 2017-03-13: 800 [IU]/h via INTRAVENOUS
  Filled 2017-03-13: qty 250

## 2017-03-13 MED ORDER — ASPIRIN 81 MG PO CHEW
81.0000 mg | CHEWABLE_TABLET | ORAL | Status: AC
  Administered 2017-03-16: 81 mg via ORAL

## 2017-03-13 MED ORDER — HEPARIN (PORCINE) IN NACL 100-0.45 UNIT/ML-% IJ SOLN
1000.0000 [IU]/h | INTRAMUSCULAR | Status: DC
  Administered 2017-03-14 – 2017-03-15 (×2): 1000 [IU]/h via INTRAVENOUS
  Filled 2017-03-13 (×2): qty 250

## 2017-03-13 MED ORDER — SODIUM CHLORIDE 0.9% FLUSH: 3.0000 mL | INTRAVENOUS | Status: DC | PRN

## 2017-03-13 NOTE — Progress Notes (Signed)
Report given to [facility].  No s/s of any acute distress or pain noted.

## 2017-03-13 NOTE — Progress Notes (Signed)
[facility] PROGRESS NOTE  Jibri Schriefer TMB:[id number] DOB: June 16, 1946 DOA: 03/09/2017   PCP: Patient, No Pcp Per     Assessment/Plan: Active Problems:   Acute respiratory failure ([place])   Chronic obstructive pulmonary disease ([place])   Pressure injury of skin    [name] admitted 7/2 with complaints of shortness of breath. He was unresponsive on arrival to the ER and was intubated. The patient was noted to have vomited with concern for possible aspiration. CXR demonstrated RML opacity. EKG with incomplete LBBB. He was started on empiric abx and admitted to ICU. 7/4 noted to have elevated troponin 7.86 >8.21. Seen by cardiology, non-ST elevation MI/demand ischemia. Transfer to [facility] 7/6   Assessment and plan Acute Hypoxic Respiratory Failure /COPD exacerbation- in setting of RML opacity / PNA. CAP vs Aspiration. CTA negative for PE Patient also has a history of bronchiectasis, tobacco use Extubated 7/4, currently on 3L [place], briefly required BiPAP yesterday in the afternoon Repeat CXR, appears to have improved Continue supplemental o2 w/ [place] for sats >90% Duoneb Q4  Continue Brovana + Pulmicort  Currently on Solumedrol to 60 mg IV Q12, gradually taper    Elevated Troponin / NSTEMI- Seen by Cardiology 7/4 who felt elevations in troponin related to known occulusions in the setting of demand ischemia. Not recommending cath at this time.  Repeat echo showing EF 40-45% and akinesis of the lateral wall and hypokinesis inferiorly with a hyperdynamic anterior wall and septum. Troponin trended down from 8.3 to 6.23 Continue aspirin, statin, beta blocker, and ACE inhibitor blood pressure allows Cath monday  Paroxsysmal afib, stable since admission: taking amiodarone and coumadin. INR subtherapeutic to 1.14. [name] [place] [facility] Cardiology following; appreciate recommendation Cardiology recommends resuming Coumadin,currently on iv heparin for cath monday     Anemia of chronic  disease - 9.6 > 8.0>8.1>8.7 Transfuse for hemoglobin less than 8.0  FOBT pending      RML opacity -? Aspiration PNA Hypothermia - initial rectal temp 95.7, Cultures no growth so far Continue ceftriaxone until 7/8 SLP Evaluation   Hyperglycemia, likely steroid-induced SSI     Acute encephalopathy -improving  Probable ETOH use - daughter states he stopped drinking ETOH years ago Continue Folate, MVI, Thiamine QD Follow for possible withdrawal symptoms        DVT prophylaxsis Heparin  Code Status:  Full code    Family Communication: Discussed in detail with the patient, all imaging results, lab results explained to the patient   Disposition Plan: Continue tele,cath monday    Consultants: Cardiology Critical care  Procedures: None   Antibiotics: Anti-infectives    Start     Dose/Rate Route Frequency Ordered Stop   03/11/17 0000  azithromycin (ZITHROMAX) 500 mg in dextrose 5 % 250 mL IVPB  Status:  Discontinued     500 mg 250 mL/hr over 60 Minutes Intravenous Every 24 hours 03/10/17 0057 03/11/17 1555   03/10/17 2200  cefTRIAXone (ROCEPHIN) 1 g in dextrose 5 % 50 mL IVPB     1 g 100 mL/hr over 30 Minutes Intravenous Every 24 hours 03/10/17 0100 03/16/17 2159   03/09/17 2315  cefTRIAXone (ROCEPHIN) 1 g in dextrose 5 % 50 mL IVPB     1 g 100 mL/hr over 30 Minutes Intravenous  Once 03/09/17 2308 03/10/17 0001   03/09/17 2315  azithromycin (ZITHROMAX) 500 mg in dextrose 5 % 250 mL IVPB     500 mg 250 mL/hr over 60 Minutes Intravenous  Once 03/09/17 2308 03/10/17 0120   03/09/17 2315  clindamycin (CLEOCIN) IVPB 600 mg     600 mg 100 mL/hr over 30 Minutes Intravenous  Once 03/09/17 2308 03/10/17 0000         HPI/Subjective: Patient denies, cp, sob, on RA   Objective: Vitals:   03/13/17 0407 03/13/17 0500 03/13/17 0600 03/13/17 0747  BP:  117/82 125/77   Pulse:  70 70 76  Resp:  '15 11 16  '$ Temp: 98.7 F (37.1 C)     TempSrc: Oral     SpO2:  (!)  87% 91% 96%  Weight:  63 kg (138 lb 14.2 oz)    Height:        Intake/Output Summary (Last 24 hours) at 03/13/17 8341 Last data filed at 03/13/17 0200  Gross per 24 hour  Intake                0 ml  Output             2950 ml  Net            -2950 ml    Exam:  Examination:  General exam: cachetic   Neuro:  AAOx3, responds to commands HEENT:  AT/[place], no scleral icterus, [place] in place Cardiovascular:  RRR, no JVD or edema Lungs:  Diminished bilaterally with some minimal crackles at the bases, but overall good air movement Abdomen:  Soft, NTND Musculoskeletal:  No edema or gross deformities Extremities: SCDs in place and no edema    Data Reviewed: I have personally reviewed following labs and imaging studies  Micro Results Recent Results (from the past 240 hour(s))  Blood culture (routine x 2)     Status: Abnormal (Preliminary result)   Collection Time: 03/09/17 11:15 PM  Result Value Ref Range Status   Specimen Description BLOOD LEFT ANTECUBITAL  Final   Special Requests   Final    BOTTLES DRAWN AEROBIC AND ANAEROBIC Blood Culture adequate volume   Culture  Setup Time   Final    GRAM POSITIVE COCCI IN CLUSTERS IN BOTH AEROBIC AND ANAEROBIC BOTTLES CRITICAL RESULT CALLED TO, READ BACK BY AND VERIFIED WITH: T RUDISILL PHARMD 1817 03/10/17 A BROWNING    Culture (A)  Final    STAPHYLOCOCCUS SPECIES (COAGULASE NEGATIVE) SUSCEPTIBILITIES TO FOLLOW    Report Status PENDING  Incomplete  Blood culture (routine x 2)     Status: Abnormal (Preliminary result)   Collection Time: 03/09/17 11:15 PM  Result Value Ref Range Status   Specimen Description BLOOD RIGHT ANTECUBITAL  Final   Special Requests   Final    BOTTLES DRAWN AEROBIC AND ANAEROBIC Blood Culture adequate volume   Culture  Setup Time   Final    GRAM POSITIVE COCCI IN CLUSTERS ANAEROBIC BOTTLE ONLY CRITICAL RESULT CALLED TO, READ BACK BY AND VERIFIED WITH: T RUDISILL PHARMD 1817 03/10/17 A BROWNING    Culture  STAPHYLOCOCCUS SPECIES (COAGULASE NEGATIVE) (A)  Final   Report Status PENDING  Incomplete  Blood Culture ID Panel (Reflexed)     Status: Abnormal   Collection Time: 03/09/17 11:15 PM  Result Value Ref Range Status   Enterococcus species NOT DETECTED NOT DETECTED Final   Listeria monocytogenes NOT DETECTED NOT DETECTED Final   Staphylococcus species DETECTED (A) NOT DETECTED Final    Comment: Methicillin (oxacillin) susceptible coagulase negative staphylococcus. Possible blood culture contaminant (unless isolated from more than one blood culture draw or clinical case suggests pathogenicity). No antibiotic treatment is indicated  for blood  culture contaminants. CRITICAL RESULT CALLED TO, READ BACK BY AND VERIFIED WITH: T RUDISILL PHARMD 1817 03/10/17 A BROWNING    Staphylococcus aureus NOT DETECTED NOT DETECTED Final   Methicillin resistance NOT DETECTED NOT DETECTED Final   Streptococcus species NOT DETECTED NOT DETECTED Final   Streptococcus agalactiae NOT DETECTED NOT DETECTED Final   Streptococcus pneumoniae NOT DETECTED NOT DETECTED Final   Streptococcus pyogenes NOT DETECTED NOT DETECTED Final   Acinetobacter baumannii NOT DETECTED NOT DETECTED Final   Enterobacteriaceae species NOT DETECTED NOT DETECTED Final   Enterobacter cloacae complex NOT DETECTED NOT DETECTED Final   Escherichia coli NOT DETECTED NOT DETECTED Final   Klebsiella oxytoca NOT DETECTED NOT DETECTED Final   Klebsiella pneumoniae NOT DETECTED NOT DETECTED Final   Proteus species NOT DETECTED NOT DETECTED Final   Serratia marcescens NOT DETECTED NOT DETECTED Final   Haemophilus influenzae NOT DETECTED NOT DETECTED Final   Neisseria meningitidis NOT DETECTED NOT DETECTED Final   Pseudomonas aeruginosa NOT DETECTED NOT DETECTED Final   Candida albicans NOT DETECTED NOT DETECTED Final   Candida glabrata NOT DETECTED NOT DETECTED Final   Candida krusei NOT DETECTED NOT DETECTED Final   Candida parapsilosis NOT  DETECTED NOT DETECTED Final   Candida tropicalis NOT DETECTED NOT DETECTED Final  MRSA PCR Screening     Status: None   Collection Time: 03/10/17  5:33 AM  Result Value Ref Range Status   MRSA by PCR NEGATIVE NEGATIVE Final    Comment:        The GeneXpert MRSA Assay (FDA approved for NASAL specimens only), is one component of a comprehensive MRSA colonization surveillance program. It is not intended to diagnose MRSA infection nor to guide or monitor treatment for MRSA infections.     Radiology Reports Ct Angio Chest Pe W Or Wo Contrast  Result Date: 03/10/2017 CLINICAL DATA:  [name]; shortness of breath and unresponsiveness. History of COPD and congestive heart failure. EXAM: CT ANGIOGRAPHY CHEST WITH CONTRAST TECHNIQUE: Multidetector CT imaging of the chest was performed using the standard protocol during bolus administration of intravenous contrast. Multiplanar CT image reconstructions and MIPs were obtained to evaluate the vascular anatomy. CONTRAST:  70 cc Isovue 370 COMPARISON:  03/09/2017 chest radiograph FINDINGS: Cardiovascular: Mild cardiomegaly. Severe coronary artery calcification. Moderate calcific atherosclerosis of the thoracic aorta. Satisfactory opacification of the pulmonary arteries. No pulmonary embolus identified. Mediastinum/Nodes: No enlarged mediastinal, hilar, or axillary lymph nodes. Thyroid gland, trachea, and esophagus demonstrate no significant findings. Lungs/Pleura: Severe emphysema with lung apex predominance. Right upper lobe ground-glass opacity measures 12 mm (series 8, image 78). Left lower lobe solid 7 mm nodule (series 8, image 113). Bilateral lower lobe dependent consolidations, diffuse peribronchial thickening and scattered mucous plugging may represent pneumonia or aspiration. Small bilateral pleural effusions. Upper Abdomen: No acute abnormality. Musculoskeletal: No chest wall abnormality. No acute or significant osseous findings. Review of the MIP  images confirms the above findings. IMPRESSION: 1. No pulmonary embolus identified. 2. Dependent consolidations with mucous plugging in the lung bases may represent pneumonia or aspiration. Small bilateral pleural effusions. 3. Mild cardiomegaly and severe coronary artery calcification. 4. 12 mm right upper lobe ground-glass nodule and 7 mm left lower lobe solid nodule. Non-contrast chest CT at 3-6 months is recommended. If the nodules are stable at time of repeat CT, then future CT at 18-24 months (from today's scan) is considered optional for low-risk patients, but is recommended for high-risk patients. This recommendation follows the  consensus statement: Guidelines for Management of Incidental Pulmonary Nodules Detected on CT Images: From the Fleischner Society 2017; Radiology 2017; 284:228-243. Electronically Signed   By: Kristine Garbe M.D.   On: 03/10/2017 03:43   Dg Chest Port 1 View  Result Date: 03/12/2017 CLINICAL DATA:  Respiratory failure EXAM: PORTABLE CHEST 1 VIEW COMPARISON:  03/12/2017 FINDINGS: Cardiac shadow is again enlarged. Vascular congestion remains with interstitial edema. The overall appearance given some technical variation in the film is slightly worsened particularly on the left. No bony abnormality is noted. IMPRESSION: Slight increase in degree of interstitial edema. Electronically Signed   By: Inez Catalina M.D.   On: 03/12/2017 15:08   Dg Chest Port 1 View  Result Date: 03/12/2017 CLINICAL DATA:  Respiratory failure. EXAM: PORTABLE CHEST 1 VIEW COMPARISON:  No prior. FINDINGS: Mild mediastinal prominence most likely prominent great vessels. Cardiomegaly with pulmonary venous congestion and interstitial prominence suggesting mild CHF. Bibasilar atelectasis and infiltrates/edema. Tiny bilateral pleural effusions cannot be excluded. No pneumothorax. IMPRESSION: 1. Congestive heart failure bilateral interstitial edema. Tiny bilateral pleural effusions cannot be excluded. 2.   Bibasilar atelectasis and infiltrates/edema. Electronically Signed   By: Marcello Moores  Register   On: 03/12/2017 06:20   Dg Chest Portable 1 View  Result Date: 03/09/2017 CLINICAL DATA:  Endotracheal tube placement and orogastric tube placement. Initial encounter. EXAM: PORTABLE CHEST 1 VIEW COMPARISON:  None. FINDINGS: The patient's endotracheal tube is seen ending 6 cm above the carina. An enteric tube is noted extending below the diaphragm. Right midlung airspace opacity may reflect pneumonia. Underlying interstitial prominence and bilateral fibrotic change are noted. No definite pleural effusion or pneumothorax is seen. The cardiomediastinal silhouette is mildly enlarged. No acute osseous abnormalities are identified. IMPRESSION: 1. Endotracheal tube seen ending 6 cm above the carina. Enteric tube noted extending below the diaphragm. 2. Right midlung airspace opacity raises concern for pneumonia. 3. Underlying interstitial prominence and bilateral fibrosis noted. 4. Mild cardiomegaly. Electronically Signed   By: Garald Balding M.D.   On: 03/09/2017 23:08     CBC  Recent Labs Lab 03/09/17 2258 03/10/17 0533 03/11/17 0439 03/12/17 0356  WBC 11.1* 6.6 9.5 8.1  HGB 9.6* 7.9* 8.0* 8.7*  HCT 33.6* 27.9* 26.6* 29.3*  PLT 321 197 231 236  MCV 85.9 84.5 81.8 81.6  MCH 24.6* 23.9* 24.6* 24.2*  MCHC 28.6* 28.3* 30.1 29.7*  RDW 16.3* 16.0* 16.3* 16.4*  LYMPHSABS  --   --  0.4*  --   MONOABS  --   --  0.3  --   EOSABS  --   --  0.0  --   BASOSABS  --   --  0.0  --     Chemistries   Recent Labs Lab 03/09/17 2258 03/10/17 0147 03/10/17 0533 03/11/17 0439 03/12/17 0356  NA 136  --  137 135 138  K 4.7  --  3.6 4.1 4.5  CL 103  --  106 104 103  CO2 25  --  '26 26 28  '$ GLUCOSE 297*  --  107* 168* 138*  BUN 16  --  15 28* 27*  CREATININE 1.06  --  0.89 0.73 0.65  CALCIUM 8.6*  --  7.9* 8.2* 8.4*  MG  --  2.3  --   --  2.2  AST 24  --   --   --   --   ALT 14*  --   --   --   --   ALKPHOS 88   --   --   --   --  BILITOT 0.4  --   --   --   --    ------------------------------------------------------------------------------------------------------------------ estimated creatinine clearance is 75.8 mL/min (by C-G formula based on SCr of 0.65 mg/dL). ------------------------------------------------------------------------------------------------------------------ No results for input(s): HGBA1C in the last 72 hours. ------------------------------------------------------------------------------------------------------------------  Recent Labs  03/11/17 0439  TRIG 44   ------------------------------------------------------------------------------------------------------------------ No results for input(s): TSH, T4TOTAL, T3FREE, THYROIDAB in the last 72 hours.  Invalid input(s): FREET3 ------------------------------------------------------------------------------------------------------------------ No results for input(s): VITAMINB12, FOLATE, FERRITIN, TIBC, IRON, RETICCTPCT in the last 72 hours.  Coagulation profile  Recent Labs Lab 03/10/17 0533  INR 1.14    No results for input(s): DDIMER in the last 72 hours.  Cardiac Enzymes  Recent Labs Lab 03/11/17 0439 03/11/17 1032 03/11/17 1650  TROPONINI 7.86* 8.21* 6.23*   ------------------------------------------------------------------------------------------------------------------ Invalid input(s): POCBNP   CBG:  Recent Labs Lab 03/12/17 1223 03/12/17 1514 03/12/17 2036 03/13/17 0010 03/13/17 0405  GLUCAP 138* 177* 95 108* 120*       Studies: Dg Chest Port 1 View  Result Date: 03/12/2017 CLINICAL DATA:  Respiratory failure EXAM: PORTABLE CHEST 1 VIEW COMPARISON:  03/12/2017 FINDINGS: Cardiac shadow is again enlarged. Vascular congestion remains with interstitial edema. The overall appearance given some technical variation in the film is slightly worsened particularly on the left. No bony abnormality  is noted. IMPRESSION: Slight increase in degree of interstitial edema. Electronically Signed   By: Inez Catalina M.D.   On: 03/12/2017 15:08   Dg Chest Port 1 View  Result Date: 03/12/2017 CLINICAL DATA:  Respiratory failure. EXAM: PORTABLE CHEST 1 VIEW COMPARISON:  No prior. FINDINGS: Mild mediastinal prominence most likely prominent great vessels. Cardiomegaly with pulmonary venous congestion and interstitial prominence suggesting mild CHF. Bibasilar atelectasis and infiltrates/edema. Tiny bilateral pleural effusions cannot be excluded. No pneumothorax. IMPRESSION: 1. Congestive heart failure bilateral interstitial edema. Tiny bilateral pleural effusions cannot be excluded. 2.  Bibasilar atelectasis and infiltrates/edema. Electronically Signed   By: Marcello Moores  Register   On: 03/12/2017 06:20      Lab Results  Component Value Date   HGBA1C 6.2 (H) 03/10/2017   Lab Results  Component Value Date   CREATININE 0.65 03/12/2017       Scheduled Meds: . amiodarone  200 mg Oral Daily  . aspirin  81 mg Oral Daily  . atorvastatin  80 mg Oral q1800  . chlorhexidine gluconate (MEDLINE KIT)  15 mL Mouth Rinse BID  . fluticasone furoate-vilanterol  1 puff Inhalation Daily  . folic acid  1 mg Per Tube Daily  . heparin  5,000 Units Subcutaneous Q8H  . insulin aspart  0-9 Units Subcutaneous Q4H  . mouth rinse  15 mL Mouth Rinse BID  . methylPREDNISolone (SOLU-MEDROL) injection  60 mg Intravenous Q12H  . metoprolol succinate  12.5 mg Oral Daily  . multivitamin with minerals  1 tablet Per Tube Daily  . thiamine  100 mg Oral Daily  . tiotropium  18 mcg Inhalation Daily   Continuous Infusions: . sodium chloride    . cefTRIAXone (ROCEPHIN)  IV 1 g (03/12/17 2111)     LOS: 3 days    Time spent: >30 MINS    Reyne Dumas  [facility] Pager [phone]. If 7PM-7AM, please contact night-coverage at [email or web address], password [facility] 03/13/2017, 8:06 AM  LOS: 3 days

## 2017-03-13 NOTE — Progress Notes (Signed)
Patient arrived on unit via wheelchair from 69M.  Wife at bedside.

## 2017-03-13 NOTE — Progress Notes (Signed)
Asked by RN Wells Guiles for pharmacy to clarify patient's home pain medications as there was some confusion about the dosing.  RX tech Tiffani went by the room and spoke with the daughter over the phone. Our previous information from patient's [facility] (verbally communicated over the phone) was oxycodone IR 5mg  BID.  Patient's fill record from the [facility] shows that patient last filled oxycodone 15mg  q4h #30 on 02/09/17 (only a 5 day supply) and has not filled it since (last fill date before that was on 01/15/17 for a 30 day supply).  Patient has also been filling oxycodone/APAP 10/325  since January- has been getting a 30 day supply each time he fills with the exception of the fill on 02/16/2017 which he obtained a 28 day supply because the pharmacy did not have enough for the full 30 day supply and he agreed to receive that they had (per pharmacy's report).  Patient's daughter adamant that patient takes oxycodone 15mg  q4h and has been taking this and NOT the oxycodone/APAP.  Of note, patient last filled the oxycodone 15mg  at a [facility], and has been consistently filling the oxycodone/APAP at a [facility].  Medication history reflects what the daughter states patient was taking despite the fill records not reflecting this.    Sylis Ketchum D. Allannah Kempen, PharmD, BCPS Clinical Pharmacist 03/13/2017 6:24 PM

## 2017-03-13 NOTE — Progress Notes (Signed)
Progress Note  Patient Name: [name] Date of Encounter: 03/13/2017  Primary Cardiologist: Dr [name]  Subjective   No chest pain; dyspnea improved; complains of "hoarse"  Inpatient Medications    Scheduled Meds: . amiodarone  200 mg Oral Daily  . aspirin  81 mg Oral Daily  . atorvastatin  80 mg Oral q1800  . chlorhexidine gluconate (MEDLINE KIT)  15 mL Mouth Rinse BID  . fluticasone furoate-vilanterol  1 puff Inhalation Daily  . folic acid  1 mg Per Tube Daily  . heparin  5,000 Units Subcutaneous Q8H  . insulin aspart  0-9 Units Subcutaneous Q4H  . mouth rinse  15 mL Mouth Rinse BID  . methylPREDNISolone (SOLU-MEDROL) injection  60 mg Intravenous Q12H  . metoprolol succinate  12.5 mg Oral Daily  . multivitamin with minerals  1 tablet Per Tube Daily  . thiamine  100 mg Oral Daily  . tiotropium  18 mcg Inhalation Daily   Continuous Infusions: . sodium chloride    . cefTRIAXone (ROCEPHIN)  IV 1 g (03/12/17 2111)   PRN Meds: sodium chloride, docusate, hydrALAZINE   Vital Signs    Vitals:   03/13/17 0407 03/13/17 0500 03/13/17 0600 03/13/17 0747  BP:  117/82 125/77   Pulse:  70 70 76  Resp:  '15 11 16  '$ Temp: 98.7 F (37.1 C)     TempSrc: Oral     SpO2:  (!) 87% 91% 96%  Weight:  63 kg (138 lb 14.2 oz)    Height:        Intake/Output Summary (Last 24 hours) at 03/13/17 0754 Last data filed at 03/13/17 0200  Gross per 24 hour  Intake                0 ml  Output             3175 ml  Net            -3175 ml   Filed Weights   03/11/17 0436 03/12/17 0500 03/13/17 0500  Weight: 65 kg (143 lb 4.8 oz) 63 kg (138 lb 14.2 oz) 63 kg (138 lb 14.2 oz)    Telemetry    Sinus- Personally Reviewed   Physical Exam   GEN: WD/WN No acute distress.   Neck: supple Cardiac: RRR, 2/6 systolic murmur apex Respiratory: mildly diminished BS throughout GI: Soft, nontender, non-distended, no masses MS: no edema Neuro: grossly intact   Labs    Chemistry  Recent  Labs Lab 03/09/17 2258 03/10/17 0533 03/11/17 0439 03/12/17 0356  NA 136 137 135 138  K 4.7 3.6 4.1 4.5  CL 103 106 104 103  CO2 '25 26 26 28  '$ GLUCOSE 297* 107* 168* 138*  BUN 16 15 28* 27*  CREATININE 1.06 0.89 0.73 0.65  CALCIUM 8.6* 7.9* 8.2* 8.4*  PROT 7.0  --   --   --   ALBUMIN 3.2*  --   --   --   AST 24  --   --   --   ALT 14*  --   --   --   ALKPHOS 88  --   --   --   BILITOT 0.4  --   --   --   GFRNONAA >60 >60 >60 >60  GFRAA >60 >60 >60 >60  ANIONGAP '8 5 5 7     '$ Hematology  Recent Labs Lab 03/10/17 0533 03/11/17 0439 03/12/17 0356  WBC 6.6 9.5 8.1  RBC 3.30* 3.25*  3.59*  HGB 7.9* 8.0* 8.7*  HCT 27.9* 26.6* 29.3*  MCV 84.5 81.8 81.6  MCH 23.9* 24.6* 24.2*  MCHC 28.3* 30.1 29.7*  RDW 16.0* 16.3* 16.4*  PLT 197 231 236    Cardiac Enzymes  Recent Labs Lab 03/09/17 2258 03/11/17 0439 03/11/17 1032 03/11/17 1650  TROPONINI 0.08* 7.86* 8.21* 6.23*    BNP  Recent Labs Lab 03/10/17 0147  BNP 412.2*      Radiology    Dg Chest Port 1 View  Result Date: 03/12/2017 CLINICAL DATA:  Respiratory failure EXAM: PORTABLE CHEST 1 VIEW COMPARISON:  03/12/2017 FINDINGS: Cardiac shadow is again enlarged. Vascular congestion remains with interstitial edema. The overall appearance given some technical variation in the film is slightly worsened particularly on the left. No bony abnormality is noted. IMPRESSION: Slight increase in degree of interstitial edema. Electronically Signed   By: Inez Catalina M.D.   On: 03/12/2017 15:08   Dg Chest Port 1 View  Result Date: 03/12/2017 CLINICAL DATA:  Respiratory failure. EXAM: PORTABLE CHEST 1 VIEW COMPARISON:  No prior. FINDINGS: Mild mediastinal prominence most likely prominent great vessels. Cardiomegaly with pulmonary venous congestion and interstitial prominence suggesting mild CHF. Bibasilar atelectasis and infiltrates/edema. Tiny bilateral pleural effusions cannot be excluded. No pneumothorax. IMPRESSION: 1. Congestive  heart failure bilateral interstitial edema. Tiny bilateral pleural effusions cannot be excluded. 2.  Bibasilar atelectasis and infiltrates/edema. Electronically Signed   By: Marcello Moores  Register   On: 03/12/2017 06:20    Patient Profile     71 y.o. male with past medical history of coronary artery disease, COPD, paroxysmal atrial fibrillation, ischemic cardiomyopathy admitted with respiratory failure felt possibly secondary to COPD with superimposed pneumonia or aspiration. Troponin elevated and cardiology asked to evaluate. Echocardiogram shows ejection fraction 40-45%, moderate mitral regurgitation and biatrial enlargement. Note ejection fraction essentially unchanged compared to previous.  Assessment & Plan    1 non-ST elevation myocardial infarction-patient presented with respiratory failure felt possibly secondary to COPD with superimposed pneumonia or aspiration. His troponin increased to maximum of 8.21. Continue ASA and statin. Continue metoprolol. Add ARB. This is felt likely to be a demand ischemia but fairly significant troponin elevation. Last catheterization 2016 showed occluded circumflex and right coronary artery. Moderate disease in LAD. Medical therapy recommended. I think given severity of CAD and significant troponin bump, we should proceed with cath now. Plan Monday (risks and benefits including MI, CVA and death discussed and he agrees to proceed).  2 history of paroxysmal atrial fibrillation-patient remains in sinus rhythm. CHADSvasc 4. Would resume amiodarone 200 mg daily. Resume coumadin following completion of all procedures.  3 ischemic cardiomyopathy-resume low-dose beta blockade. Add ARB.  4 normocytic anemia-further workup per primary service.  5 COPD/pneumonia-per critical care medicine.     Signed, Kirk Ruths, MD  03/13/2017, 7:54 AM

## 2017-03-13 NOTE — Progress Notes (Signed)
Patient requesting oxy for pain.  MD notified.

## 2017-03-13 NOTE — Progress Notes (Signed)
ANTICOAGULATION CONSULT NOTE - Initial Consult  Pharmacy Consult for heparin Indication: atrial fibrillation  Allergies  Allergen Reactions  . Aspirin   . Ibuprofen     Patient Measurements: Height: 5\' 5"  (165.1 cm) Weight: 138 lb 14.2 oz (63 kg) IBW/kg (Calculated) : 61.5 Heparin Dosing Weight: 63 kg  Vital Signs: Temp: 98.8 F (37.1 C) (07/06 1200) Temp Source: Oral (07/06 1200) BP: 131/73 (07/06 1300) Pulse Rate: 80 (07/06 1300)  Labs:  Recent Labs  03/11/17 0439 03/11/17 1032 03/11/17 1650 03/12/17 0356 03/13/17 0912  HGB 8.0*  --   --  8.7*  --   HCT 26.6*  --   --  29.3*  --   PLT 231  --   --  236  --   LABPROT  --   --   --   --  13.9  INR  --   --   --   --  1.07  CREATININE 0.73  --   --  0.65  --   TROPONINI 7.86* 8.21* 6.23*  --   --     Estimated Creatinine Clearance: 75.8 mL/min (by C-G formula based on SCr of 0.65 mg/dL).   Medical History: Past Medical History:  Diagnosis Date  . AAA (abdominal aortic aneurysm) without rupture ([place])   . CHF (congestive heart failure) ([place])   . COPD (chronic obstructive pulmonary disease) ([place])   . Coronary artery disease   . Hypertension   . Renal artery dissection [facility])     Assessment: 71 yo male admitted with dyspnea and required intubation. He was found to have an elevated troponin, thought to be NSTEMI vs demand ischemia. He also has pAFib with CHADSVASc of 4. At one point he was on warfarin however after speaking with the family, they said they did not believe he was taking this PTA. His INR at admission was 1.1, current INR today is also 1. Plan is for patient to go for cardiac cath on Monday. He received heparin 5000 units [place] x1 earlier this morning, will therefore hold the initial bolus with the infusion. Patient's hgb dropped to 7.9 while admitted, with no signs of active bleeding, will need to titrate heparin conservatively. FOBT ordered but has not been sent. hgb yesterday 8.7, plts wnl.   Goal of  Therapy:  Heparin level 0.3-0.7 units/ml Monitor platelets by anticoagulation protocol: Yes    Plan:  -Heparin 800 units/hr -Daily HL, CBC -Check level this evening   Harvel Quale 03/13/2017,1:45 PM

## 2017-03-13 NOTE — Progress Notes (Signed)
ANTICOAGULATION CONSULT NOTE - FOLLOW UP    HL = 0.25 (goal 0.3 - 0.7 units/mL) Heparin dosing weight = 63 kg   Assessment: 70 YOM continues on IV heparin for pAFib (CHADsVASc = 4) and NSTEMI vs demand ischemia.  Heparin level is slightly below goal; no bleeding per RN.   Plan: - Increase heparin gtt to 900 units/hr - Check 6 hr heparin level  - F/U FOB    [name], PharmD, BCPS 03/13/2017, 9:11 PM

## 2017-03-13 NOTE — Progress Notes (Signed)
Transfered patient to [facility] with all belongings, daughter present with transfer.

## 2017-03-14 ENCOUNTER — Inpatient Hospital Stay (HOSPITAL_COMMUNITY): Payer: Medicare Other

## 2017-03-14 LAB — COMPREHENSIVE METABOLIC PANEL
ALBUMIN: 2.7 g/dL — AB (ref 3.5–5.0)
ALT: 47 U/L (ref 17–63)
ANION GAP: 7 (ref 5–15)
AST: 37 U/L (ref 15–41)
Alkaline Phosphatase: 62 U/L (ref 38–126)
BILIRUBIN TOTAL: 1.2 mg/dL (ref 0.3–1.2)
BUN: 40 mg/dL — ABNORMAL HIGH (ref 6–20)
CO2: 27 mmol/L (ref 22–32)
Calcium: 8.6 mg/dL — ABNORMAL LOW (ref 8.9–10.3)
Chloride: 102 mmol/L (ref 101–111)
Creatinine, Ser: 0.78 mg/dL (ref 0.61–1.24)
GFR calc Af Amer: >60 mL/min (ref >60)
Glucose, Bld: 129 mg/dL — ABNORMAL HIGH (ref 65–99)
POTASSIUM: 3.7 mmol/L (ref 3.5–5.1)
Sodium: 136 mmol/L (ref 135–145)
TOTAL PROTEIN: 6.1 g/dL — AB (ref 6.5–8.1)

## 2017-03-14 LAB — GLUCOSE, CAPILLARY
GLUCOSE-CAPILLARY: 126 mg/dL — AB (ref 65–99)
GLUCOSE-CAPILLARY: 142 mg/dL — AB (ref 65–99)
GLUCOSE-CAPILLARY: 164 mg/dL — AB (ref 65–99)
Glucose-Capillary: 117 mg/dL — ABNORMAL HIGH (ref 65–99)
Glucose-Capillary: 117 mg/dL — ABNORMAL HIGH (ref 65–99)

## 2017-03-14 LAB — CBC
HCT: 32.8 % — ABNORMAL LOW (ref 39.0–52.0)
Hemoglobin: 9.9 g/dL — ABNORMAL LOW (ref 13.0–17.0)
MCH: 24.6 pg — ABNORMAL LOW (ref 26.0–34.0)
MCHC: 30.2 g/dL (ref 30.0–36.0)
MCV: 81.6 fL (ref 78.0–100.0)
PLATELETS: 266 10*3/uL (ref 150–400)
RBC: 4.02 MIL/uL — AB (ref 4.22–5.81)
RDW: 16.9 % — ABNORMAL HIGH (ref 11.5–15.5)
WBC: 7.3 10*3/uL (ref 4.0–10.5)

## 2017-03-14 LAB — TROPONIN I: Troponin I: 5.55 ng/mL (ref <0.03)

## 2017-03-14 LAB — PROTIME-INR
INR: 1.2
PROTHROMBIN TIME: 15.2 s (ref 11.4–15.2)

## 2017-03-14 LAB — BRAIN NATRIURETIC PEPTIDE: B NATRIURETIC PEPTIDE 5: 1130.7 pg/mL — AB (ref 0.0–100.0)

## 2017-03-14 LAB — HEPARIN LEVEL (UNFRACTIONATED)
HEPARIN UNFRACTIONATED: 0.28 [IU]/mL — AB (ref 0.30–0.70)
Heparin Unfractionated: 0.4 IU/mL (ref 0.30–0.70)

## 2017-03-14 MED ORDER — FUROSEMIDE 10 MG/ML IJ SOLN
40.0000 mg | Freq: Every day | INTRAMUSCULAR | Status: DC
  Administered 2017-03-14 – 2017-03-16 (×3): 40 mg via INTRAVENOUS
  Filled 2017-03-14 (×3): qty 4

## 2017-03-14 NOTE — Progress Notes (Signed)
ANTICOAGULATION CONSULT NOTE - Initial Consult  Pharmacy Consult for heparin Indication: atrial fibrillation  Allergies  Allergen Reactions  . Aspirin   . Ibuprofen     Patient Measurements: Height: 5\' 5"  (165.1 cm) Weight: 140 lb 3.4 oz (63.6 kg) IBW/kg (Calculated) : 61.5 Heparin Dosing Weight: 63 kg  Vital Signs: Temp: 98.5 F (36.9 C) (07/07 0443) Temp Source: Oral (07/07 0443) BP: 132/77 (07/07 0443) Pulse Rate: 65 (07/07 0443)  Labs:  Recent Labs  03/11/17 1032 03/11/17 1650 03/12/17 0356 03/13/17 0912 03/13/17 2018 03/14/17 0414  HGB  --   --  8.7*  --   --  9.9*  HCT  --   --  29.3*  --   --  32.8*  PLT  --   --  236  --   --  266  LABPROT  --   --   --  13.9  --  15.2  INR  --   --   --  1.07  --  1.20  HEPARINUNFRC  --   --   --   --  0.25* 0.28*  CREATININE  --   --  0.65  --   --  0.78  TROPONINI 8.21* 6.23*  --   --   --   --     Estimated Creatinine Clearance: 75.8 mL/min (by C-G formula based on SCr of 0.78 mg/dL).   Medical History: Past Medical History:  Diagnosis Date  . AAA (abdominal aortic aneurysm) without rupture ([place])   . Acute respiratory failure ([place]) 03/13/2017  . CHF (congestive heart failure) ([place])   . COPD (chronic obstructive pulmonary disease) ([place])   . Coronary artery disease   . Hypertension   . Renal artery dissection ([facility])     Assessment: 19 YOM continues on IV heparin for pAFib (CHADsVASc = 4) and NSTEMI vs demand ischemia.  Heparin level is slightly below goal; CBC stable; no bleeding per RN.   Goal of Therapy:  Heparin level 0.3-0.7 units/ml Monitor platelets by anticoagulation protocol: Yes   Plan:  -Increase heparin gtt to 1000 units/hr -Heparin level in 6 hours  -Daily heparin level CBC -Monitor for s/s bleeding  -F/u cards plans   Argie Ramming, PharmD Clinical Pharmacist 03/14/17 6:20 AM

## 2017-03-14 NOTE — Progress Notes (Signed)
Progress Note  Patient Name: [name] Date of Encounter: 03/14/2017  Primary Cardiologist: Dr [name]  Subjective   Dyspnea improved, mild SOB this AM  Inpatient Medications    Scheduled Meds: . amiodarone  200 mg Oral Daily  . aspirin  81 mg Oral Daily  . [START ON 03/16/2017] aspirin  81 mg Oral Pre-Cath  . atorvastatin  80 mg Oral q1800  . chlorhexidine gluconate (MEDLINE KIT)  15 mL Mouth Rinse BID  . fluticasone furoate-vilanterol  1 puff Inhalation Daily  . folic acid  1 mg Per Tube Daily  . insulin aspart  0-9 Units Subcutaneous Q4H  . losartan  25 mg Oral Daily  . mouth rinse  15 mL Mouth Rinse BID  . methylPREDNISolone (SOLU-MEDROL) injection  40 mg Intravenous Q12H  . metoprolol succinate  12.5 mg Oral Daily  . multivitamin with minerals  1 tablet Per Tube Daily  . pantoprazole  40 mg Oral Daily  . sodium chloride flush  3 mL Intravenous Q12H  . thiamine  100 mg Oral Daily  . tiotropium  18 mcg Inhalation Daily   Continuous Infusions: . sodium chloride 250 mL (03/13/17 1512)  . [START ON 03/16/2017] sodium chloride    . [START ON 03/16/2017] sodium chloride    . cefTRIAXone (ROCEPHIN)  IV Stopped (03/13/17 2225)  . heparin 1,000 Units/hr (03/14/17 0630)   PRN Meds: sodium chloride, [START ON 03/16/2017] sodium chloride, docusate, hydrALAZINE, oxyCODONE, sodium chloride flush   Vital Signs    Vitals:   03/14/17 0443 03/14/17 0734 03/14/17 0758 03/14/17 0759  BP: 132/77 136/85    Pulse: 65 70    Resp: 16 18    Temp: 98.5 F (36.9 C)     TempSrc: Oral     SpO2: 90% 92% 95% 95%  Weight: 140 lb 3.4 oz (63.6 kg)     Height:        Intake/Output Summary (Last 24 hours) at 03/14/17 1019 Last data filed at 03/14/17 0700  Gross per 24 hour  Intake                0 ml  Output             1270 ml  Net            -1270 ml   Filed Weights   03/12/17 0500 03/13/17 0500 03/14/17 0443  Weight: 138 lb 14.2 oz (63 kg) 138 lb 14.2 oz (63 kg) 140 lb 3.4 oz (63.6  kg)    Telemetry    Sinus rhythm with APCs- Personally Reviewed   Physical Exam   GEN: Well nourished, well developed, in no acute distress  HEENT: normal  Neck: no JVD, carotid bruits, or masses Cardiac: RRR; 2/6 murmur at the apex, no rubs, or gallops,no edema  Respiratory:  clear to auscultation bilaterally, normal work of breathing, decreased breath sounds at the bases GI: soft, nontender, nondistended, + BS MS: no deformity or atrophy  Skin: warm and dry Neuro:  Strength and sensation are intact Psych: euthymic mood, full affect    Labs    Chemistry  Recent Labs Lab 03/09/17 2258  03/11/17 0439 03/12/17 0356 03/14/17 0414  NA 136  < > 135 138 136  K 4.7  < > 4.1 4.5 3.7  CL 103  < > 104 103 102  CO2 25  < > _0 GLUCOSE 297*  < > 168* 138* 129*  BUN 16  < >  28* 27* 40*  CREATININE 1.06  < > 0.73 0.65 0.78  CALCIUM 8.6*  < > 8.2* 8.4* 8.6*  PROT 7.0  --   --   --  6.1*  ALBUMIN 3.2*  --   --   --  2.7*  AST 24  --   --   --  37  ALT 14*  --   --   --  47  ALKPHOS 88  --   --   --  62  BILITOT 0.4  --   --   --  1.2  GFRNONAA >60  < > >60 >60 >60  GFRAA >60  < > >60 >60 >60  ANIONGAP 8  < > _0 < > = values in this interval not displayed.   Hematology  Recent Labs Lab 03/11/17 0439 03/12/17 0356 03/14/17 0414  WBC 9.5 8.1 7.3  RBC 3.25* 3.59* 4.02*  HGB 8.0* 8.7* 9.9*  HCT 26.6* 29.3* 32.8*  MCV 81.8 81.6 81.6  MCH 24.6* 24.2* 24.6*  MCHC 30.1 29.7* 30.2  RDW 16.3* 16.4* 16.9*  PLT 231 236 266    Cardiac Enzymes  Recent Labs Lab 03/09/17 2258 03/11/17 0439 03/11/17 1032 03/11/17 1650  TROPONINI 0.08* 7.86* 8.21* 6.23*    BNP  Recent Labs Lab 03/10/17 0147  BNP 412.2*      Radiology    Dg Chest Port 1 View  Result Date: 03/14/2017 CLINICAL DATA:  Patient with respiratory failure. EXAM: PORTABLE CHEST 1 VIEW COMPARISON:  Chest radiograph 03/12/2017. FINDINGS: Patient is rotated. Persistent right peritracheal opacity.  Monitoring leads overlie the patient. Stable cardiac and mediastinal contours. Interval improvement in bilateral interstitial pulmonary opacities. No pleural effusion or pneumothorax. IMPRESSION: Improving interstitial opacities favored to represent improving edema. Persistent right peritracheal opacity. Recommend comparison with outside priors if available. If none are available, consider further evaluation the non acute setting with chest CT to exclude peritracheal mass. Cardiomegaly. Electronically Signed   By: Lovey Newcomer M.D.   On: 03/14/2017 09:54   Dg Chest Port 1 View  Result Date: 03/12/2017 CLINICAL DATA:  Respiratory failure EXAM: PORTABLE CHEST 1 VIEW COMPARISON:  03/12/2017 FINDINGS: Cardiac shadow is again enlarged. Vascular congestion remains with interstitial edema. The overall appearance given some technical variation in the film is slightly worsened particularly on the left. No bony abnormality is noted. IMPRESSION: Slight increase in degree of interstitial edema. Electronically Signed   By: Inez Catalina M.D.   On: 03/12/2017 15:08    Patient Profile     71 y.o. male with past medical history of coronary artery disease, COPD, paroxysmal atrial fibrillation, ischemic cardiomyopathy admitted with respiratory failure felt possibly secondary to COPD with superimposed pneumonia or aspiration. Troponin elevated and cardiology asked to evaluate. Echocardiogram shows ejection fraction 40-45%, moderate mitral regurgitation and biatrial enlargement. Note ejection fraction essentially unchanged compared to previous.  Assessment & Plan    1 non-ST elevation myocardial infarction- thought due to demand with COPD exacerbation due to pneumonia/aspiration. That being said, troponin elevated to 8.21. Plan for cath Monday. Questions answered today.  2 history of paroxysmal atrial fibrillation- sinus rhythm today. Continue amiodarone. Restart coumadin after all procedures  3 ischemic cardiomyopathy-  on losartan/metoprolol  4 normocytic anemia-further workup per primary service.  5 COPD/pneumonia-per primary team.     Signed, Will Meredith Leeds, MD  03/14/2017, 10:19 AM

## 2017-03-14 NOTE — Progress Notes (Signed)
This is an old order and not indicated at this time/ RT will monitor.

## 2017-03-14 NOTE — Progress Notes (Signed)
Patient is no longer complaining of SOB. MD notified. MD stated ok to give pain medication. Will continue to monitor.

## 2017-03-14 NOTE — Progress Notes (Addendum)
[facility] for heparin Indication: atrial fibrillation  Allergies  Allergen Reactions  . Aspirin   . Ibuprofen     Patient Measurements: Height: 5\' 5"  (165.1 cm) Weight: 140 lb 3.4 oz (63.6 kg) IBW/kg (Calculated) : 61.5 Heparin Dosing Weight: 63 kg  Vital Signs: Temp: 97.5 F (36.4 C) (07/07 1028) Temp Source: Oral (07/07 1028) BP: 140/78 (07/07 1028) Pulse Rate: 77 (07/07 1028)  Labs:  Recent Labs  03/11/17 1650 03/12/17 0356 03/13/17 0912 03/13/17 2018 03/14/17 0414 03/14/17 0949 03/14/17 1141  HGB  --  8.7*  --   --  9.9*  --   --   HCT  --  29.3*  --   --  32.8*  --   --   PLT  --  236  --   --  266  --   --   LABPROT  --   --  13.9  --  15.2  --   --   INR  --   --  1.07  --  1.20  --   --   HEPARINUNFRC  --   --   --  0.25* 0.28*  --  0.40  CREATININE  --  0.65  --   --  0.78  --   --   TROPONINI 6.23*  --   --   --   --  5.55*  --     Estimated Creatinine Clearance: 75.8 mL/min (by C-G formula based on SCr of 0.78 mg/dL).   Medical History: Past Medical History:  Diagnosis Date  . AAA (abdominal aortic aneurysm) without rupture ([place])   . Acute respiratory failure ([place]) 03/13/2017  . CHF (congestive heart failure) ([place])   . COPD (chronic obstructive pulmonary disease) ([place])   . Coronary artery disease   . Hypertension   . Renal artery dissection ([facility])     Assessment: 55 YOM continues on IV heparin for pAFib (CHADsVASc = 4) and NSTEMI vs demand ischemia.    Heparin level at goal  Goal of Therapy:  Heparin level 0.3-0.7 units/ml Monitor platelets by anticoagulation protocol: Yes   Plan:  -Continue heparin at 1000 units / hr -Daily heparin level CBC -Monitor for s/s bleeding  -Cath Monday  Thank you Anette Guarneri, PharmD [phone]   03/14/17 1:32 PM

## 2017-03-14 NOTE — Progress Notes (Signed)
     [facility] PROGRESS NOTE  [name] MRN:[id number] DOB: 05/05/1946 DOA: 03/09/2017   PCP: Hedgecock, Suzanne, PA-C     Assessment/Plan: Active Problems:   Acute respiratory failure (HCC)   Chronic obstructive pulmonary disease (HCC)   Pressure injury of skin   [name] is a 70 year old gentleman with medical history significant for but not limited to COPD, not oxygen dependent, and coronary artery disease/ Ischemic cardiomyopathy, and  paroxysmal atrial fibrillation, admitted and initially managed by PCCM, for  respiratory failure secondary to COPD/ Pneumonia/nitis Aspiration.  Hospital course complicated by NSTEMI -elevated troponin, with a peak of 8.21. Echocardiogram showed ejection fraction 40-45%( no significant change), moderate mitral regurgitation and biatrial enlargement.  Required endotracheal intubation and mechanical ventilation - extubated on 03/11/17, and tx to [facility] on 03/13/17.   Assessment and plan #1.Acute Hypoxic Respiratory Failure /COPD exacerbation: CTA negative for PE Exacerbating factor- RML CAP vs Aspiration Pneumonitis.  Continue supplemental O2 w/ [place] for sats >90%  Steroids/Abx -ceftriaxone until 7/8 Pulmonary Toileting measures Cultures no growth to date  SLP Evaluation    #2 NSTEMI: 2D Echo showing EF 40-45% and akinesis of the lateral wall and hypokinesis inferiorly with a hyperdynamic anterior wall and septum. BNP 1130.7 with c/o sob( cxr edema) -lasix Continue ASA, beta blocker, and ARB. Hep gtt Cardiology consulting - Cath Monday 03/16/17  #3 Paroxsysmal Afib: CHADSvasc 4 Cont amiodarone; hold coumadin - resume when heparin gtt d/ced   #4 Anemia of chronic disease: Follow Clinically Keep Hb at least 8.0 g/dl Transfuse prn  FOBT      #5 Hyperglycemia: steroid-induced No clinical DM- A1C 6.2% SSI as needed for now. Out-patient f/u  #6 Hx of ETOH: Continue Folate, MVI, Thiamine  Watch withdrawal symptoms         DVT prophylaxsis Heparin  Code Status:  Full code    Family Communication: Discussed with patient  Disposition Plan: To be determined    Consultants: Cardiology Critical care  Procedures: None   Antibiotics: Anti-infectives    Start     Dose/Rate Route Frequency Ordered Stop   03/11/17 0000  azithromycin (ZITHROMAX) 500 mg in dextrose 5 % 250 mL IVPB  Status:  Discontinued     500 mg 250 mL/hr over 60 Minutes Intravenous Every 24 hours 03/10/17 0057 03/11/17 1555   03/10/17 2200  cefTRIAXone (ROCEPHIN) 1 g in dextrose 5 % 50 mL IVPB     1 g 100 mL/hr over 30 Minutes Intravenous Every 24 hours 03/10/17 0100 03/16/17 2159   03/09/17 2315  cefTRIAXone (ROCEPHIN) 1 g in dextrose 5 % 50 mL IVPB     1 g 100 mL/hr over 30 Minutes Intravenous  Once 03/09/17 2308 03/10/17 0001   03/09/17 2315  azithromycin (ZITHROMAX) 500 mg in dextrose 5 % 250 mL IVPB     500 mg 250 mL/hr over 60 Minutes Intravenous  Once 03/09/17 2308 03/10/17 0120   03/09/17 2315  clindamycin (CLEOCIN) IVPB 600 mg     600 mg 100 mL/hr over 30 Minutes Intravenous  Once 03/09/17 2308 03/10/17 0000         HPI/Subjective: Patient denies, cp, no f/c - still sob Objective: Vitals:   03/14/17 0443 03/14/17 0734 03/14/17 0758 03/14/17 0759  BP: 132/77 136/85    Pulse: 65 70    Resp: 16 18    Temp: 98.5 F (36.9 C)     TempSrc: Oral     SpO2: 90% 92% 95%   95%  Weight: 63.6 kg (140 lb 3.4 oz)     Height:        Intake/Output Summary (Last 24 hours) at 03/14/17 0759 Last data filed at 03/14/17 0700  Gross per 24 hour  Intake              240 ml  Output             1270 ml  Net            -1030 ml    Exam:  Examination:  General exam: No acute distress  Neuro:  AAOx3, (-) acute focal deficit HEENT:  AT/[place], no scleral icterus, [place] in place Cardiovascular:  RRR, no JVD Lungs:  Diminished BS with b/l rhonchi  Abdomen:  Soft, NTND Extremities: no cyanosis,  Trace bipedal pitting  edema    Data Reviewed: I have personally reviewed following labs and imaging studies  Micro Results Recent Results (from the past 240 hour(s))  Blood culture (routine x 2)     Status: Abnormal   Collection Time: 03/09/17 11:15 PM  Result Value Ref Range Status   Specimen Description BLOOD LEFT ANTECUBITAL  Final   Special Requests   Final    BOTTLES DRAWN AEROBIC AND ANAEROBIC Blood Culture adequate volume   Culture  Setup Time   Final    GRAM POSITIVE COCCI IN CLUSTERS IN BOTH AEROBIC AND ANAEROBIC BOTTLES CRITICAL RESULT CALLED TO, READ BACK BY AND VERIFIED WITH: T RUDISILL PHARMD 1817 03/10/17 A BROWNING    Culture STAPHYLOCOCCUS HOMINIS (A)  Final   Report Status 03/13/2017 FINAL  Final   Organism ID, Bacteria STAPHYLOCOCCUS HOMINIS  Final      Susceptibility   Staphylococcus hominis - MIC*    CIPROFLOXACIN <=0.5 SENSITIVE Sensitive     ERYTHROMYCIN <=0.25 SENSITIVE Sensitive     GENTAMICIN <=0.5 SENSITIVE Sensitive     OXACILLIN <=0.25 SENSITIVE Sensitive     TETRACYCLINE <=1 SENSITIVE Sensitive     VANCOMYCIN 1 SENSITIVE Sensitive     TRIMETH/SULFA <=10 SENSITIVE Sensitive     CLINDAMYCIN <=0.25 SENSITIVE Sensitive     RIFAMPIN <=0.5 SENSITIVE Sensitive     Inducible Clindamycin NEGATIVE Sensitive     * STAPHYLOCOCCUS HOMINIS  Blood culture (routine x 2)     Status: Abnormal   Collection Time: 03/09/17 11:15 PM  Result Value Ref Range Status   Specimen Description BLOOD RIGHT ANTECUBITAL  Final   Special Requests   Final    BOTTLES DRAWN AEROBIC AND ANAEROBIC Blood Culture adequate volume   Culture  Setup Time   Final    GRAM POSITIVE COCCI IN CLUSTERS ANAEROBIC BOTTLE ONLY CRITICAL RESULT CALLED TO, READ BACK BY AND VERIFIED WITH: T RUDISILL PHARMD 1817 03/10/17 A BROWNING    Culture (A)  Final    STAPHYLOCOCCUS HOMINIS SUSCEPTIBILITIES PERFORMED ON PREVIOUS CULTURE WITHIN THE LAST 5 DAYS.    Report Status 03/13/2017 FINAL  Final  Blood Culture ID Panel  (Reflexed)     Status: Abnormal   Collection Time: 03/09/17 11:15 PM  Result Value Ref Range Status   Enterococcus species NOT DETECTED NOT DETECTED Final   Listeria monocytogenes NOT DETECTED NOT DETECTED Final   Staphylococcus species DETECTED (A) NOT DETECTED Final    Comment: Methicillin (oxacillin) susceptible coagulase negative staphylococcus. Possible blood culture contaminant (unless isolated from more than one blood culture draw or clinical case suggests pathogenicity). No antibiotic treatment is indicated for blood  culture contaminants. CRITICAL RESULT CALLED TO,   READ BACK BY AND VERIFIED WITH: T RUDISILL PHARMD 1817 03/10/17 A BROWNING    Staphylococcus aureus NOT DETECTED NOT DETECTED Final   Methicillin resistance NOT DETECTED NOT DETECTED Final   Streptococcus species NOT DETECTED NOT DETECTED Final   Streptococcus agalactiae NOT DETECTED NOT DETECTED Final   Streptococcus pneumoniae NOT DETECTED NOT DETECTED Final   Streptococcus pyogenes NOT DETECTED NOT DETECTED Final   Acinetobacter baumannii NOT DETECTED NOT DETECTED Final   Enterobacteriaceae species NOT DETECTED NOT DETECTED Final   Enterobacter cloacae complex NOT DETECTED NOT DETECTED Final   Escherichia coli NOT DETECTED NOT DETECTED Final   Klebsiella oxytoca NOT DETECTED NOT DETECTED Final   Klebsiella pneumoniae NOT DETECTED NOT DETECTED Final   Proteus species NOT DETECTED NOT DETECTED Final   Serratia marcescens NOT DETECTED NOT DETECTED Final   Haemophilus influenzae NOT DETECTED NOT DETECTED Final   Neisseria meningitidis NOT DETECTED NOT DETECTED Final   Pseudomonas aeruginosa NOT DETECTED NOT DETECTED Final   Candida albicans NOT DETECTED NOT DETECTED Final   Candida glabrata NOT DETECTED NOT DETECTED Final   Candida krusei NOT DETECTED NOT DETECTED Final   Candida parapsilosis NOT DETECTED NOT DETECTED Final   Candida tropicalis NOT DETECTED NOT DETECTED Final  MRSA PCR Screening     Status: None    Collection Time: 03/10/17  5:33 AM  Result Value Ref Range Status   MRSA by PCR NEGATIVE NEGATIVE Final    Comment:        The GeneXpert MRSA Assay (FDA approved for NASAL specimens only), is one component of a comprehensive MRSA colonization surveillance program. It is not intended to diagnose MRSA infection nor to guide or monitor treatment for MRSA infections.   Culture, blood (routine x 2)     Status: None (Preliminary result)   Collection Time: 03/12/17 11:40 AM  Result Value Ref Range Status   Specimen Description BLOOD RIGHT ANTECUBITAL  Final   Special Requests   Final    BOTTLES DRAWN AEROBIC AND ANAEROBIC Blood Culture adequate volume   Culture NO GROWTH 1 DAY  Final   Report Status PENDING  Incomplete  Culture, blood (routine x 2)     Status: None (Preliminary result)   Collection Time: 03/12/17 11:49 AM  Result Value Ref Range Status   Specimen Description BLOOD LEFT ANTECUBITAL  Final   Special Requests IN PEDIATRIC BOTTLE Blood Culture adequate volume  Final   Culture  Setup Time   Final    GRAM POSITIVE COCCI IN CLUSTERS IN PEDIATRIC BOTTLE CRITICAL VALUE NOTED.  VALUE IS CONSISTENT WITH PREVIOUSLY REPORTED AND CALLED VALUE.    Culture GRAM POSITIVE COCCI  Final   Report Status PENDING  Incomplete    Radiology Reports Ct Angio Chest Pe W Or Wo Contrast  Result Date: 03/10/2017 CLINICAL DATA:  70 y/o M; shortness of breath and unresponsiveness. History of COPD and congestive heart failure. EXAM: CT ANGIOGRAPHY CHEST WITH CONTRAST TECHNIQUE: Multidetector CT imaging of the chest was performed using the standard protocol during bolus administration of intravenous contrast. Multiplanar CT image reconstructions and MIPs were obtained to evaluate the vascular anatomy. CONTRAST:  70 cc Isovue 370 COMPARISON:  03/09/2017 chest radiograph FINDINGS: Cardiovascular: Mild cardiomegaly. Severe coronary artery calcification. Moderate calcific atherosclerosis of the thoracic  aorta. Satisfactory opacification of the pulmonary arteries. No pulmonary embolus identified. Mediastinum/Nodes: No enlarged mediastinal, hilar, or axillary lymph nodes. Thyroid gland, trachea, and esophagus demonstrate no significant findings. Lungs/Pleura: Severe emphysema with lung apex predominance. Right   upper lobe ground-glass opacity measures 12 mm (series 8, image 78). Left lower lobe solid 7 mm nodule (series 8, image 113). Bilateral lower lobe dependent consolidations, diffuse peribronchial thickening and scattered mucous plugging may represent pneumonia or aspiration. Small bilateral pleural effusions. Upper Abdomen: No acute abnormality. Musculoskeletal: No chest wall abnormality. No acute or significant osseous findings. Review of the MIP images confirms the above findings. IMPRESSION: 1. No pulmonary embolus identified. 2. Dependent consolidations with mucous plugging in the lung bases may represent pneumonia or aspiration. Small bilateral pleural effusions. 3. Mild cardiomegaly and severe coronary artery calcification. 4. 12 mm right upper lobe ground-glass nodule and 7 mm left lower lobe solid nodule. Non-contrast chest CT at 3-6 months is recommended. If the nodules are stable at time of repeat CT, then future CT at 18-24 months (from today's scan) is considered optional for low-risk patients, but is recommended for high-risk patients. This recommendation follows the consensus statement: Guidelines for Management of Incidental Pulmonary Nodules Detected on CT Images: From the Fleischner Society 2017; Radiology 2017; 284:228-243. Electronically Signed   By: Lance  Furusawa-Stratton M.D.   On: 03/10/2017 03:43   Dg Chest Port 1 View  Result Date: 03/12/2017 CLINICAL DATA:  Respiratory failure EXAM: PORTABLE CHEST 1 VIEW COMPARISON:  03/12/2017 FINDINGS: Cardiac shadow is again enlarged. Vascular congestion remains with interstitial edema. The overall appearance given some technical variation in the  film is slightly worsened particularly on the left. No bony abnormality is noted. IMPRESSION: Slight increase in degree of interstitial edema. Electronically Signed   By: Mark  Lukens M.D.   On: 03/12/2017 15:08   Dg Chest Port 1 View  Result Date: 03/12/2017 CLINICAL DATA:  Respiratory failure. EXAM: PORTABLE CHEST 1 VIEW COMPARISON:  No prior. FINDINGS: Mild mediastinal prominence most likely prominent great vessels. Cardiomegaly with pulmonary venous congestion and interstitial prominence suggesting mild CHF. Bibasilar atelectasis and infiltrates/edema. Tiny bilateral pleural effusions cannot be excluded. No pneumothorax. IMPRESSION: 1. Congestive heart failure bilateral interstitial edema. Tiny bilateral pleural effusions cannot be excluded. 2.  Bibasilar atelectasis and infiltrates/edema. Electronically Signed   By: Thomas  Register   On: 03/12/2017 06:20   Dg Chest Portable 1 View  Result Date: 03/09/2017 CLINICAL DATA:  Endotracheal tube placement and orogastric tube placement. Initial encounter. EXAM: PORTABLE CHEST 1 VIEW COMPARISON:  None. FINDINGS: The patient's endotracheal tube is seen ending 6 cm above the carina. An enteric tube is noted extending below the diaphragm. Right midlung airspace opacity may reflect pneumonia. Underlying interstitial prominence and bilateral fibrotic change are noted. No definite pleural effusion or pneumothorax is seen. The cardiomediastinal silhouette is mildly enlarged. No acute osseous abnormalities are identified. IMPRESSION: 1. Endotracheal tube seen ending 6 cm above the carina. Enteric tube noted extending below the diaphragm. 2. Right midlung airspace opacity raises concern for pneumonia. 3. Underlying interstitial prominence and bilateral fibrosis noted. 4. Mild cardiomegaly. Electronically Signed   By: Jeffery  Chang M.D.   On: 03/09/2017 23:08     CBC  Recent Labs Lab 03/09/17 2258 03/10/17 0533 03/11/17 0439 03/12/17 0356 03/14/17 0414  WBC  11.1* 6.6 9.5 8.1 7.3  HGB 9.6* 7.9* 8.0* 8.7* 9.9*  HCT 33.6* 27.9* 26.6* 29.3* 32.8*  PLT 321 197 231 236 266  MCV 85.9 84.5 81.8 81.6 81.6  MCH 24.6* 23.9* 24.6* 24.2* 24.6*  MCHC 28.6* 28.3* 30.1 29.7* 30.2  RDW 16.3* 16.0* 16.3* 16.4* 16.9*  LYMPHSABS  --   --  0.4*  --   --     MONOABS  --   --  0.3  --   --   EOSABS  --   --  0.0  --   --   BASOSABS  --   --  0.0  --   --     Chemistries   Recent Labs Lab 03/09/17 2258 03/10/17 0147 03/10/17 0533 03/11/17 0439 03/12/17 0356 03/14/17 0414  NA 136  --  137 135 138 136  K 4.7  --  3.6 4.1 4.5 3.7  CL 103  --  106 104 103 102  CO2 25  --  26 26 28 27  GLUCOSE 297*  --  107* 168* 138* 129*  BUN 16  --  15 28* 27* 40*  CREATININE 1.06  --  0.89 0.73 0.65 0.78  CALCIUM 8.6*  --  7.9* 8.2* 8.4* 8.6*  MG  --  2.3  --   --  2.2  --   AST 24  --   --   --   --  37  ALT 14*  --   --   --   --  47  ALKPHOS 88  --   --   --   --  62  BILITOT 0.4  --   --   --   --  1.2   ------------------------------------------------------------------------------------------------------------------ estimated creatinine clearance is 75.8 mL/min (by C-G formula based on SCr of 0.78 mg/dL). ------------------------------------------------------------------------------------------------------------------ No results for input(s): HGBA1C in the last 72 hours. ------------------------------------------------------------------------------------------------------------------ No results for input(s): CHOL, HDL, LDLCALC, TRIG, CHOLHDL, LDLDIRECT in the last 72 hours. ------------------------------------------------------------------------------------------------------------------ No results for input(s): TSH, T4TOTAL, T3FREE, THYROIDAB in the last 72 hours.  Invalid input(s): FREET3 ------------------------------------------------------------------------------------------------------------------ No results for input(s): VITAMINB12, FOLATE, FERRITIN,  TIBC, IRON, RETICCTPCT in the last 72 hours.  Coagulation profile  Recent Labs Lab 03/10/17 0533 03/13/17 0912 03/14/17 0414  INR 1.14 1.07 1.20    No results for input(s): DDIMER in the last 72 hours.  Cardiac Enzymes  Recent Labs Lab 03/11/17 0439 03/11/17 1032 03/11/17 1650  TROPONINI 7.86* 8.21* 6.23*   ------------------------------------------------------------------------------------------------------------------ Invalid input(s): POCBNP   CBG:  Recent Labs Lab 03/13/17 1719 03/13/17 2016 03/14/17 0100 03/14/17 0440 03/14/17 0755  GLUCAP 149* 111* 117* 126* 117*       Studies: Dg Chest Port 1 View  Result Date: 03/12/2017 CLINICAL DATA:  Respiratory failure EXAM: PORTABLE CHEST 1 VIEW COMPARISON:  03/12/2017 FINDINGS: Cardiac shadow is again enlarged. Vascular congestion remains with interstitial edema. The overall appearance given some technical variation in the film is slightly worsened particularly on the left. No bony abnormality is noted. IMPRESSION: Slight increase in degree of interstitial edema. Electronically Signed   By: Mark  Lukens M.D.   On: 03/12/2017 15:08      Lab Results  Component Value Date   HGBA1C 6.2 (H) 03/10/2017   Lab Results  Component Value Date   CREATININE 0.78 03/14/2017       Scheduled Meds: . amiodarone  200 mg Oral Daily  . aspirin  81 mg Oral Daily  . [START ON 03/16/2017] aspirin  81 mg Oral Pre-Cath  . atorvastatin  80 mg Oral q1800  . chlorhexidine gluconate (MEDLINE KIT)  15 mL Mouth Rinse BID  . fluticasone furoate-vilanterol  1 puff Inhalation Daily  . folic acid  1 mg Per Tube Daily  . insulin aspart  0-9 Units Subcutaneous Q4H  . losartan  25 mg Oral Daily  . mouth rinse  15 mL Mouth Rinse BID  .   methylPREDNISolone (SOLU-MEDROL) injection  40 mg Intravenous Q12H  . metoprolol succinate  12.5 mg Oral Daily  . multivitamin with minerals  1 tablet Per Tube Daily  . pantoprazole  40 mg Oral Daily  .  sodium chloride flush  3 mL Intravenous Q12H  . thiamine  100 mg Oral Daily  . tiotropium  18 mcg Inhalation Daily   Continuous Infusions: . sodium chloride 250 mL (03/13/17 1512)  . [START ON 03/16/2017] sodium chloride    . [START ON 03/16/2017] sodium chloride    . cefTRIAXone (ROCEPHIN)  IV Stopped (03/13/17 2225)  . heparin 1,000 Units/hr (03/14/17 0630)     LOS: 4 days    Time spent: [place]  [facility] Pager [phone]. If 7PM-7AM, please contact night-coverage at [email or web address], password [facility] 03/14/2017, 7:59 AM  LOS: 4 days

## 2017-03-14 NOTE — Progress Notes (Signed)
Patient continues to complain of SOB. O2 sats are 95% on RA and respirations are 18. MD notified. MD placed orders for EKG, BMP, Troponin, chest Xray, and O2. Orders followed. Will continue to monitor.

## 2017-03-14 NOTE — Progress Notes (Signed)
Patient is complaining of SOB. No chest pain reported. V/S are 70, 16, 136/85, 92% On RA. MD notified and aware. MD stated that he would go see the patient and to hold the pain pill for now. No further orders. Will continue to monitor.

## 2017-03-15 DIAGNOSIS — J411 Mucopurulent chronic bronchitis: Secondary | ICD-10-CM

## 2017-03-15 LAB — CBC
HEMATOCRIT: 33.4 % — AB (ref 39.0–52.0)
Hemoglobin: 10.2 g/dL — ABNORMAL LOW (ref 13.0–17.0)
MCH: 24.5 pg — ABNORMAL LOW (ref 26.0–34.0)
MCHC: 30.5 g/dL (ref 30.0–36.0)
MCV: 80.1 fL (ref 78.0–100.0)
Platelets: 270 10*3/uL (ref 150–400)
RBC: 4.17 MIL/uL — ABNORMAL LOW (ref 4.22–5.81)
RDW: 16.4 % — ABNORMAL HIGH (ref 11.5–15.5)
WBC: 7.3 10*3/uL (ref 4.0–10.5)

## 2017-03-15 LAB — BRAIN NATRIURETIC PEPTIDE: B NATRIURETIC PEPTIDE 5: 793.5 pg/mL — AB (ref 0.0–100.0)

## 2017-03-15 LAB — CULTURE, BLOOD (ROUTINE X 2): SPECIAL REQUESTS: ADEQUATE

## 2017-03-15 LAB — GLUCOSE, CAPILLARY
GLUCOSE-CAPILLARY: 146 mg/dL — AB (ref 65–99)
GLUCOSE-CAPILLARY: 165 mg/dL — AB (ref 65–99)
GLUCOSE-CAPILLARY: 189 mg/dL — AB (ref 65–99)
Glucose-Capillary: 165 mg/dL — ABNORMAL HIGH (ref 65–99)
Glucose-Capillary: 145 mg/dL — ABNORMAL HIGH (ref 65–99)
Glucose-Capillary: 122 mg/dL — ABNORMAL HIGH (ref 65–99)
Glucose-Capillary: 158 mg/dL — ABNORMAL HIGH (ref 65–99)

## 2017-03-15 LAB — PROTIME-INR
INR: 1.2
Prothrombin Time: 15.2 seconds (ref 11.4–15.2)

## 2017-03-15 LAB — HEPARIN LEVEL (UNFRACTIONATED): Heparin Unfractionated: 0.48 IU/mL (ref 0.30–0.70)

## 2017-03-15 NOTE — Progress Notes (Signed)
[facility] PROGRESS NOTE  Daren Yeagle SNK:[id number] DOB: [birth year] DOA: 03/09/2017   PCP: Ardith Dark, PA-C     Assessment/Plan: Active Problems:   Acute respiratory failure ([place])   Chronic obstructive pulmonary disease ([place])   Pressure injury of skin   Mr Rod is a 71 year old gentleman with medical history significant for but not limited to COPD, not oxygen dependent, and coronary artery disease/ Ischemic cardiomyopathy, and  paroxysmal atrial fibrillation, admitted and initially managed by PCCM, for  respiratory failure secondary to COPD/ Pneumonia/nitis Aspiration.  Hospital course complicated by NSTEMI -elevated troponin, with a peak of 8.21. Echocardiogram showed ejection fraction 40-45%( no significant change), moderate mitral regurgitation and biatrial enlargement.  Required endotracheal intubation and mechanical ventilation - extubated on 03/11/17, and tx to [facility] on 03/13/17.   Assessment and plan #1.Acute Hypoxic Respiratory Failure /COPD exacerbation: CTA negative for PE Exacerbating factor- RML CAP vs Aspiration Pneumonitis.  Continue supplemental O2 w/ [place] for sats >90%  Steroids/Abx -ceftriaxone until 7/8 Pulmonary Toileting measures Cultures no growth to date     #2 NSTEMI: 2D Echo showing EF 40-45% and akinesis of the lateral wall and hypokinesis inferiorly with a hyperdynamic anterior wall and septum. BNP 1130.7 with c/o sob( cxr edema) -lasix Continue ASA, beta blocker, and ARB. Hep gtt Cardiology consulting - Cath Monday 03/16/17  #3 Paroxsysmal Afib: CHADSvasc 4 Cont amiodarone; hold coumadin - resume when heparin gtt d/ced   #4 Anemia of chronic disease: Follow Clinically Keep Hb at least 8.0 g/dl Transfuse prn  FOBT      #5 Hyperglycemia: steroid-induced No clinical DM- A1C 6.2% SSI as needed for now. Out-patient f/u  #6 Hx of ETOH: Continue Folate, MVI, Thiamine  Watch withdrawal symptoms        DVT  prophylaxsis Heparin  Code Status:  Full code    Family Communication: Discussed with patient  Disposition Plan: To be determined    Consultants: Cardiology Critical care  Procedures: None   Antibiotics: Anti-infectives    Start     Dose/Rate Route Frequency Ordered Stop   03/11/17 0000  azithromycin (ZITHROMAX) 500 mg in dextrose 5 % 250 mL IVPB  Status:  Discontinued     500 mg 250 mL/hr over 60 Minutes Intravenous Every 24 hours 03/10/17 0057 03/11/17 1555   03/10/17 2200  cefTRIAXone (ROCEPHIN) 1 g in dextrose 5 % 50 mL IVPB     1 g 100 mL/hr over 30 Minutes Intravenous Every 24 hours 03/10/17 0100 03/16/17 2159   03/09/17 2315  cefTRIAXone (ROCEPHIN) 1 g in dextrose 5 % 50 mL IVPB     1 g 100 mL/hr over 30 Minutes Intravenous  Once 03/09/17 2308 03/10/17 0001   03/09/17 2315  azithromycin (ZITHROMAX) 500 mg in dextrose 5 % 250 mL IVPB     500 mg 250 mL/hr over 60 Minutes Intravenous  Once 03/09/17 2308 03/10/17 0120   03/09/17 2315  clindamycin (CLEOCIN) IVPB 600 mg     600 mg 100 mL/hr over 30 Minutes Intravenous  Once 03/09/17 2308 03/10/17 0000         HPI/Subjective: Patient denies, cp, no f/c - notes  Sob a lot better today Objective: Vitals:   03/14/17 2019 03/15/17 0409 03/15/17 0830 03/15/17 1029  BP: 100/62 130/75  110/70  Pulse: 62 60  60  Resp: _0 Temp: 97.7 F (36.5 C) 97.7 F (36.5 C)  98.1 F (36.7 C)  TempSrc:  Oral Oral  Oral  SpO2: 92% 92% 95% 92%  Weight: 63.2 kg (139 lb 5.3 oz)     Height:        Intake/Output Summary (Last 24 hours) at 03/15/17 1137 Last data filed at 03/15/17 1044  Gross per 24 hour  Intake             1081 ml  Output              900 ml  Net              181 ml    Exam:  Examination:  General exam: No acute distress  Neuro:  AAOx3, (-) acute focal deficit HEENT:  AT/[address], no scleral icterus,  in place Cardiovascular:  RRR, no JVD Lungs:  Diminished BS with b/l rhonchi  Abdomen:  Soft,  NTND Extremities: no cyanosis,  Trace bipedal pitting edema    Data Reviewed: I have personally reviewed following labs and imaging studies  Micro Results Recent Results (from the past 240 hour(s))  Blood culture (routine x 2)     Status: Abnormal   Collection Time: 03/09/17 11:15 PM  Result Value Ref Range Status   Specimen Description BLOOD LEFT ANTECUBITAL  Final   Special Requests   Final    BOTTLES DRAWN AEROBIC AND ANAEROBIC Blood Culture adequate volume   Culture  Setup Time   Final    GRAM POSITIVE COCCI IN CLUSTERS IN BOTH AEROBIC AND ANAEROBIC BOTTLES CRITICAL RESULT CALLED TO, READ BACK BY AND VERIFIED WITH: T RUDISILL PHARMD 1817 03/10/17 A BROWNING    Culture STAPHYLOCOCCUS HOMINIS (A)  Final   Report Status 03/13/2017 FINAL  Final   Organism ID, Bacteria STAPHYLOCOCCUS HOMINIS  Final      Susceptibility   Staphylococcus hominis - MIC*    CIPROFLOXACIN <=0.5 SENSITIVE Sensitive     ERYTHROMYCIN <=0.25 SENSITIVE Sensitive     GENTAMICIN <=0.5 SENSITIVE Sensitive     OXACILLIN <=0.25 SENSITIVE Sensitive     TETRACYCLINE <=1 SENSITIVE Sensitive     VANCOMYCIN 1 SENSITIVE Sensitive     TRIMETH/SULFA <=10 SENSITIVE Sensitive     CLINDAMYCIN <=0.25 SENSITIVE Sensitive     RIFAMPIN <=0.5 SENSITIVE Sensitive     Inducible Clindamycin NEGATIVE Sensitive     * STAPHYLOCOCCUS HOMINIS  Blood culture (routine x 2)     Status: Abnormal   Collection Time: 03/09/17 11:15 PM  Result Value Ref Range Status   Specimen Description BLOOD RIGHT ANTECUBITAL  Final   Special Requests   Final    BOTTLES DRAWN AEROBIC AND ANAEROBIC Blood Culture adequate volume   Culture  Setup Time   Final    GRAM POSITIVE COCCI IN CLUSTERS ANAEROBIC BOTTLE ONLY CRITICAL RESULT CALLED TO, READ BACK BY AND VERIFIED WITH: T RUDISILL PHARMD 1817 03/10/17 A BROWNING    Culture (A)  Final    STAPHYLOCOCCUS HOMINIS SUSCEPTIBILITIES PERFORMED ON PREVIOUS CULTURE WITHIN THE LAST 5 DAYS.    Report Status  03/13/2017 FINAL  Final  Blood Culture ID Panel (Reflexed)     Status: Abnormal   Collection Time: 03/09/17 11:15 PM  Result Value Ref Range Status   Enterococcus species NOT DETECTED NOT DETECTED Final   Listeria monocytogenes NOT DETECTED NOT DETECTED Final   Staphylococcus species DETECTED (A) NOT DETECTED Final    Comment: Methicillin (oxacillin) susceptible coagulase negative staphylococcus. Possible blood culture contaminant (unless isolated from more than one blood culture draw or clinical case suggests pathogenicity). No antibiotic treatment  is indicated for blood  culture contaminants. CRITICAL RESULT CALLED TO, READ BACK BY AND VERIFIED WITH: T RUDISILL PHARMD 1817 03/10/17 A BROWNING    Staphylococcus aureus NOT DETECTED NOT DETECTED Final   Methicillin resistance NOT DETECTED NOT DETECTED Final   Streptococcus species NOT DETECTED NOT DETECTED Final   Streptococcus agalactiae NOT DETECTED NOT DETECTED Final   Streptococcus pneumoniae NOT DETECTED NOT DETECTED Final   Streptococcus pyogenes NOT DETECTED NOT DETECTED Final   Acinetobacter baumannii NOT DETECTED NOT DETECTED Final   Enterobacteriaceae species NOT DETECTED NOT DETECTED Final   Enterobacter cloacae complex NOT DETECTED NOT DETECTED Final   Escherichia coli NOT DETECTED NOT DETECTED Final   Klebsiella oxytoca NOT DETECTED NOT DETECTED Final   Klebsiella pneumoniae NOT DETECTED NOT DETECTED Final   Proteus species NOT DETECTED NOT DETECTED Final   Serratia marcescens NOT DETECTED NOT DETECTED Final   Haemophilus influenzae NOT DETECTED NOT DETECTED Final   Neisseria meningitidis NOT DETECTED NOT DETECTED Final   Pseudomonas aeruginosa NOT DETECTED NOT DETECTED Final   Candida albicans NOT DETECTED NOT DETECTED Final   Candida glabrata NOT DETECTED NOT DETECTED Final   Candida krusei NOT DETECTED NOT DETECTED Final   Candida parapsilosis NOT DETECTED NOT DETECTED Final   Candida tropicalis NOT DETECTED NOT DETECTED  Final  MRSA PCR Screening     Status: None   Collection Time: 03/10/17  5:33 AM  Result Value Ref Range Status   MRSA by PCR NEGATIVE NEGATIVE Final    Comment:        The GeneXpert MRSA Assay (FDA approved for NASAL specimens only), is one component of a comprehensive MRSA colonization surveillance program. It is not intended to diagnose MRSA infection nor to guide or monitor treatment for MRSA infections.   Culture, blood (routine x 2)     Status: None (Preliminary result)   Collection Time: 03/12/17 11:40 AM  Result Value Ref Range Status   Specimen Description BLOOD RIGHT ANTECUBITAL  Final   Special Requests   Final    BOTTLES DRAWN AEROBIC AND ANAEROBIC Blood Culture adequate volume   Culture NO GROWTH 3 DAYS  Final   Report Status PENDING  Incomplete  Culture, blood (routine x 2)     Status: Abnormal   Collection Time: 03/12/17 11:49 AM  Result Value Ref Range Status   Specimen Description BLOOD LEFT ANTECUBITAL  Final   Special Requests IN PEDIATRIC BOTTLE Blood Culture adequate volume  Final   Culture  Setup Time   Final    GRAM POSITIVE COCCI IN CLUSTERS IN PEDIATRIC BOTTLE CRITICAL VALUE NOTED.  VALUE IS CONSISTENT WITH PREVIOUSLY REPORTED AND CALLED VALUE.    Culture (A)  Final    STAPHYLOCOCCUS HOMINIS SUSCEPTIBILITIES PERFORMED ON PREVIOUS CULTURE WITHIN THE LAST 5 DAYS.    Report Status 03/15/2017 FINAL  Final    Radiology Reports Ct Angio Chest Pe W Or Wo Contrast  Result Date: 03/10/2017 CLINICAL DATA:  [name]; shortness of breath and unresponsiveness. History of COPD and congestive heart failure. EXAM: CT ANGIOGRAPHY CHEST WITH CONTRAST TECHNIQUE: Multidetector CT imaging of the chest was performed using the standard protocol during bolus administration of intravenous contrast. Multiplanar CT image reconstructions and MIPs were obtained to evaluate the vascular anatomy. CONTRAST:  70 cc Isovue 370 COMPARISON:  03/09/2017 chest radiograph FINDINGS:  Cardiovascular: Mild cardiomegaly. Severe coronary artery calcification. Moderate calcific atherosclerosis of the thoracic aorta. Satisfactory opacification of the pulmonary arteries. No pulmonary embolus identified. Mediastinum/Nodes: No  enlarged mediastinal, hilar, or axillary lymph nodes. Thyroid gland, trachea, and esophagus demonstrate no significant findings. Lungs/Pleura: Severe emphysema with lung apex predominance. Right upper lobe ground-glass opacity measures 12 mm (series 8, image 78). Left lower lobe solid 7 mm nodule (series 8, image 113). Bilateral lower lobe dependent consolidations, diffuse peribronchial thickening and scattered mucous plugging may represent pneumonia or aspiration. Small bilateral pleural effusions. Upper Abdomen: No acute abnormality. Musculoskeletal: No chest wall abnormality. No acute or significant osseous findings. Review of the MIP images confirms the above findings. IMPRESSION: 1. No pulmonary embolus identified. 2. Dependent consolidations with mucous plugging in the lung bases may represent pneumonia or aspiration. Small bilateral pleural effusions. 3. Mild cardiomegaly and severe coronary artery calcification. 4. 12 mm right upper lobe ground-glass nodule and 7 mm left lower lobe solid nodule. Non-contrast chest CT at 3-6 months is recommended. If the nodules are stable at time of repeat CT, then future CT at 18-24 months (from today's scan) is considered optional for low-risk patients, but is recommended for high-risk patients. This recommendation follows the consensus statement: Guidelines for Management of Incidental Pulmonary Nodules Detected on CT Images: From the Fleischner Society 2017; Radiology 2017; 284:228-243. Electronically Signed   By: Kristine Garbe M.D.   On: 03/10/2017 03:43   Dg Chest Port 1 View  Result Date: 03/14/2017 CLINICAL DATA:  Patient with respiratory failure. EXAM: PORTABLE CHEST 1 VIEW COMPARISON:  Chest radiograph 03/12/2017.  FINDINGS: Patient is rotated. Persistent right peritracheal opacity. Monitoring leads overlie the patient. Stable cardiac and mediastinal contours. Interval improvement in bilateral interstitial pulmonary opacities. No pleural effusion or pneumothorax. IMPRESSION: Improving interstitial opacities favored to represent improving edema. Persistent right peritracheal opacity. Recommend comparison with outside priors if available. If none are available, consider further evaluation the non acute setting with chest CT to exclude peritracheal mass. Cardiomegaly. Electronically Signed   By: Lovey Newcomer M.D.   On: 03/14/2017 09:54   Dg Chest Port 1 View  Result Date: 03/12/2017 CLINICAL DATA:  Respiratory failure EXAM: PORTABLE CHEST 1 VIEW COMPARISON:  03/12/2017 FINDINGS: Cardiac shadow is again enlarged. Vascular congestion remains with interstitial edema. The overall appearance given some technical variation in the film is slightly worsened particularly on the left. No bony abnormality is noted. IMPRESSION: Slight increase in degree of interstitial edema. Electronically Signed   By: Inez Catalina M.D.   On: 03/12/2017 15:08   Dg Chest Port 1 View  Result Date: 03/12/2017 CLINICAL DATA:  Respiratory failure. EXAM: PORTABLE CHEST 1 VIEW COMPARISON:  No prior. FINDINGS: Mild mediastinal prominence most likely prominent great vessels. Cardiomegaly with pulmonary venous congestion and interstitial prominence suggesting mild CHF. Bibasilar atelectasis and infiltrates/edema. Tiny bilateral pleural effusions cannot be excluded. No pneumothorax. IMPRESSION: 1. Congestive heart failure bilateral interstitial edema. Tiny bilateral pleural effusions cannot be excluded. 2.  Bibasilar atelectasis and infiltrates/edema. Electronically Signed   By: Marcello Moores  Register   On: 03/12/2017 06:20   Dg Chest Portable 1 View  Result Date: 03/09/2017 CLINICAL DATA:  Endotracheal tube placement and orogastric tube placement. Initial  encounter. EXAM: PORTABLE CHEST 1 VIEW COMPARISON:  None. FINDINGS: The patient's endotracheal tube is seen ending 6 cm above the carina. An enteric tube is noted extending below the diaphragm. Right midlung airspace opacity may reflect pneumonia. Underlying interstitial prominence and bilateral fibrotic change are noted. No definite pleural effusion or pneumothorax is seen. The cardiomediastinal silhouette is mildly enlarged. No acute osseous abnormalities are identified. IMPRESSION: 1. Endotracheal tube seen ending 6  cm above the carina. Enteric tube noted extending below the diaphragm. 2. Right midlung airspace opacity raises concern for pneumonia. 3. Underlying interstitial prominence and bilateral fibrosis noted. 4. Mild cardiomegaly. Electronically Signed   By: Garald Balding M.D.   On: 03/09/2017 23:08     CBC  Recent Labs Lab 03/10/17 0533 03/11/17 0439 03/12/17 0356 03/14/17 0414 03/15/17 0251  WBC 6.6 9.5 8.1 7.3 7.3  HGB 7.9* 8.0* 8.7* 9.9* 10.2*  HCT 27.9* 26.6* 29.3* 32.8* 33.4*  PLT 197 231 236 266 270  MCV 84.5 81.8 81.6 81.6 80.1  MCH 23.9* 24.6* 24.2* 24.6* 24.5*  MCHC 28.3* 30.1 29.7* 30.2 30.5  RDW 16.0* 16.3* 16.4* 16.9* 16.4*  LYMPHSABS  --  0.4*  --   --   --   MONOABS  --  0.3  --   --   --   EOSABS  --  0.0  --   --   --   BASOSABS  --  0.0  --   --   --     Chemistries   Recent Labs Lab 03/09/17 2258 03/10/17 0147 03/10/17 0533 03/11/17 0439 03/12/17 0356 03/14/17 0414  NA 136  --  137 135 138 136  K 4.7  --  3.6 4.1 4.5 3.7  CL 103  --  106 104 103 102  CO2 25  --  _0 GLUCOSE 297*  --  107* 168* 138* 129*  BUN 16  --  15 28* 27* 40*  CREATININE 1.06  --  0.89 0.73 0.65 0.78  CALCIUM 8.6*  --  7.9* 8.2* 8.4* 8.6*  MG  --  2.3  --   --  2.2  --   AST 24  --   --   --   --  37  ALT 14*  --   --   --   --  47  ALKPHOS 88  --   --   --   --  62  BILITOT 0.4  --   --   --   --  1.2    ------------------------------------------------------------------------------------------------------------------ estimated creatinine clearance is 75.8 mL/min (by C-G formula based on SCr of 0.78 mg/dL). ------------------------------------------------------------------------------------------------------------------ No results for input(s): HGBA1C in the last 72 hours. ------------------------------------------------------------------------------------------------------------------ No results for input(s): CHOL, HDL, LDLCALC, TRIG, CHOLHDL, LDLDIRECT in the last 72 hours. ------------------------------------------------------------------------------------------------------------------ No results for input(s): TSH, T4TOTAL, T3FREE, THYROIDAB in the last 72 hours.  Invalid input(s): FREET3 ------------------------------------------------------------------------------------------------------------------ No results for input(s): VITAMINB12, FOLATE, FERRITIN, TIBC, IRON, RETICCTPCT in the last 72 hours.  Coagulation profile  Recent Labs Lab 03/10/17 0533 03/13/17 0912 03/14/17 0414 03/15/17 0251  INR 1.14 1.07 1.20 1.20    No results for input(s): DDIMER in the last 72 hours.  Cardiac Enzymes  Recent Labs Lab 03/11/17 1032 03/11/17 1650 03/14/17 0949  TROPONINI 8.21* 6.23* 5.55*   ------------------------------------------------------------------------------------------------------------------ Invalid input(s): POCBNP   CBG:  Recent Labs Lab 03/14/17 1727 03/14/17 2016 03/15/17 0047 03/15/17 0406 03/15/17 0805  GLUCAP 164* 165* 165* 146* 145*       Studies: Dg Chest Port 1 View  Result Date: 03/14/2017 CLINICAL DATA:  Patient with respiratory failure. EXAM: PORTABLE CHEST 1 VIEW COMPARISON:  Chest radiograph 03/12/2017. FINDINGS: Patient is rotated. Persistent right peritracheal opacity. Monitoring leads overlie the patient. Stable cardiac and mediastinal  contours. Interval improvement in bilateral interstitial pulmonary opacities. No pleural effusion or pneumothorax. IMPRESSION: Improving interstitial opacities favored to represent improving edema. Persistent right peritracheal opacity.  Recommend comparison with outside priors if available. If none are available, consider further evaluation the non acute setting with chest CT to exclude peritracheal mass. Cardiomegaly. Electronically Signed   By: Lovey Newcomer M.D.   On: 03/14/2017 09:54      Lab Results  Component Value Date   HGBA1C 6.2 (H) 03/10/2017   Lab Results  Component Value Date   CREATININE 0.78 03/14/2017       Scheduled Meds: . amiodarone  200 mg Oral Daily  . aspirin  81 mg Oral Daily  . [START ON 03/16/2017] aspirin  81 mg Oral Pre-Cath  . atorvastatin  80 mg Oral q1800  . chlorhexidine gluconate (MEDLINE KIT)  15 mL Mouth Rinse BID  . fluticasone furoate-vilanterol  1 puff Inhalation Daily  . folic acid  1 mg Per Tube Daily  . furosemide  40 mg Intravenous Daily  . insulin aspart  0-9 Units Subcutaneous Q4H  . losartan  25 mg Oral Daily  . mouth rinse  15 mL Mouth Rinse BID  . methylPREDNISolone (SOLU-MEDROL) injection  40 mg Intravenous Q12H  . metoprolol succinate  12.5 mg Oral Daily  . multivitamin with minerals  1 tablet Per Tube Daily  . pantoprazole  40 mg Oral Daily  . sodium chloride flush  3 mL Intravenous Q12H  . thiamine  100 mg Oral Daily  . tiotropium  18 mcg Inhalation Daily   Continuous Infusions: . sodium chloride 250 mL (03/13/17 1512)  . [START ON 03/16/2017] sodium chloride    . [START ON 03/16/2017] sodium chloride    . cefTRIAXone (ROCEPHIN)  IV Stopped (03/14/17 2040)  . heparin 1,000 Units/hr (03/14/17 1704)     LOS: 5 days    Time spent: [place]  [facility] Pager [phone]. If 7PM-7AM, please contact night-coverage at [email or web address], password [email or web address] 03/15/2017, 11:37 AM  LOS: 5 days

## 2017-03-15 NOTE — Progress Notes (Signed)
[place] for heparin Indication: atrial fibrillation  Allergies  Allergen Reactions  . Aspirin   . Ibuprofen     Patient Measurements: Height: 5\' 5"  (165.1 cm) Weight: 139 lb 5.3 oz (63.2 kg) IBW/kg (Calculated) : 61.5 Heparin Dosing Weight: 63 kg  Vital Signs: Temp: 98.1 F (36.7 C) (07/08 1029) Temp Source: Oral (07/08 1029) BP: 110/70 (07/08 1029) Pulse Rate: 60 (07/08 1029)  Labs:  Recent Labs  03/13/17 0912  03/14/17 0414 03/14/17 0949 03/14/17 1141 03/15/17 0251  HGB  --   --  9.9*  --   --  10.2*  HCT  --   --  32.8*  --   --  33.4*  PLT  --   --  266  --   --  270  LABPROT 13.9  --  15.2  --   --  15.2  INR 1.07  --  1.20  --   --  1.20  HEPARINUNFRC  --   < > 0.28*  --  0.40 0.48  CREATININE  --   --  0.78  --   --   --   TROPONINI  --   --   --  5.55*  --   --   < > = values in this interval not displayed.  Estimated Creatinine Clearance: 75.8 mL/min (by C-G formula based on SCr of 0.78 mg/dL).   Medical History: Past Medical History:  Diagnosis Date  . AAA (abdominal aortic aneurysm) without rupture ([place])   . Acute respiratory failure ([place]) 03/13/2017  . CHF (congestive heart failure) ([place])   . COPD (chronic obstructive pulmonary disease) ([place])   . Coronary artery disease   . Hypertension   . 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    Heparin level at goal  Goal of Therapy:  Heparin level 0.3-0.7 units/ml Monitor platelets by anticoagulation protocol: Yes   Plan:  -Continue heparin at 1000 units / hr -Daily heparin level CBC -Monitor for s/s bleeding  -Cath Monday  Thank you Anette Guarneri, PharmD [phone]   03/15/17 11:07 AM

## 2017-03-16 ENCOUNTER — Encounter (HOSPITAL_COMMUNITY)
Admission: EM | Disposition: A | Discharge status: Home / Self Care | Payer: Self-pay | Source: Home / Self Care | Attending: Internal Medicine

## 2017-03-16 DIAGNOSIS — I248 Other forms of acute ischemic heart disease: Secondary | ICD-10-CM | POA: Diagnosis present

## 2017-03-16 DIAGNOSIS — I2510 Atherosclerotic heart disease of native coronary artery without angina pectoris: Secondary | ICD-10-CM

## 2017-03-16 DIAGNOSIS — J1529 Pneumonia due to other staphylococcus: Secondary | ICD-10-CM

## 2017-03-16 DIAGNOSIS — R778 Other specified abnormalities of plasma proteins: Secondary | ICD-10-CM | POA: Diagnosis present

## 2017-03-16 DIAGNOSIS — R7989 Other specified abnormal findings of blood chemistry: Secondary | ICD-10-CM

## 2017-03-16 DIAGNOSIS — R7881 Bacteremia: Secondary | ICD-10-CM | POA: Diagnosis present

## 2017-03-16 DIAGNOSIS — B957 Other staphylococcus as the cause of diseases classified elsewhere: Secondary | ICD-10-CM | POA: Diagnosis present

## 2017-03-16 DIAGNOSIS — F17210 Nicotine dependence, cigarettes, uncomplicated: Secondary | ICD-10-CM

## 2017-03-16 HISTORY — PX: LEFT HEART CATH AND CORONARY ANGIOGRAPHY: CATH118249

## 2017-03-16 LAB — GLUCOSE, CAPILLARY
GLUCOSE-CAPILLARY: 131 mg/dL — AB (ref 65–99)
GLUCOSE-CAPILLARY: 152 mg/dL — AB (ref 65–99)
GLUCOSE-CAPILLARY: 163 mg/dL — AB (ref 65–99)
GLUCOSE-CAPILLARY: 176 mg/dL — AB (ref 65–99)
GLUCOSE-CAPILLARY: 190 mg/dL — AB (ref 65–99)
GLUCOSE-CAPILLARY: 208 mg/dL — AB (ref 65–99)

## 2017-03-16 LAB — CBC
HEMATOCRIT: 34.1 % — AB (ref 39.0–52.0)
HEMOGLOBIN: 10.3 g/dL — AB (ref 13.0–17.0)
MCH: 24.5 pg — ABNORMAL LOW (ref 26.0–34.0)
MCHC: 30.2 g/dL (ref 30.0–36.0)
MCV: 81 fL (ref 78.0–100.0)
Platelets: 288 10*3/uL (ref 150–400)
RBC: 4.21 MIL/uL — AB (ref 4.22–5.81)
RDW: 16.5 % — ABNORMAL HIGH (ref 11.5–15.5)
WBC: 12.7 10*3/uL — ABNORMAL HIGH (ref 4.0–10.5)

## 2017-03-16 LAB — HEPARIN LEVEL (UNFRACTIONATED): Heparin Unfractionated: 0.53 IU/mL (ref 0.30–0.70)

## 2017-03-16 LAB — PROTIME-INR
INR: 1.18
Prothrombin Time: 15.1 seconds (ref 11.4–15.2)

## 2017-03-16 SURGERY — LEFT HEART CATH AND CORONARY ANGIOGRAPHY
Anesthesia: LOCAL

## 2017-03-16 MED ORDER — FENTANYL CITRATE (PF) 100 MCG/2ML IJ SOLN
INTRAMUSCULAR | Status: DC | PRN
  Administered 2017-03-16: 25 ug via INTRAVENOUS

## 2017-03-16 MED ORDER — SODIUM CHLORIDE 0.9 % IV SOLN: 250.0000 mL | INTRAVENOUS | Status: DC | PRN

## 2017-03-16 MED ORDER — FENTANYL CITRATE (PF) 100 MCG/2ML IJ SOLN
INTRAMUSCULAR | Status: AC
Start: 2017-03-16 — End: 2017-03-16
  Filled 2017-03-16: qty 2

## 2017-03-16 MED ORDER — HEPARIN SODIUM (PORCINE) 1000 UNIT/ML IJ SOLN
INTRAMUSCULAR | Status: DC | PRN
  Administered 2017-03-16: 3500 [IU] via INTRAVENOUS

## 2017-03-16 MED ORDER — IOPAMIDOL (ISOVUE-370) INJECTION 76%
INTRAVENOUS | Status: AC
  Filled 2017-03-16: qty 100

## 2017-03-16 MED ORDER — CEFAZOLIN SODIUM-DEXTROSE 1-4 GM/50ML-% IV SOLN
1.0000 g | Freq: Three times a day (TID) | INTRAVENOUS | Status: DC
  Administered 2017-03-16 – 2017-03-17 (×2): 1 g via INTRAVENOUS
  Filled 2017-03-16 (×4): qty 50

## 2017-03-16 MED ORDER — IOPAMIDOL (ISOVUE-370) INJECTION 76%
INTRAVENOUS | Status: DC | PRN
  Administered 2017-03-16: 60 mL via INTRA_ARTERIAL

## 2017-03-16 MED ORDER — WARFARIN - PHARMACIST DOSING INPATIENT: Freq: Every day | Status: DC

## 2017-03-16 MED ORDER — SODIUM CHLORIDE 0.9 % WEIGHT BASED INFUSION: 1.0000 mL/kg/h | INTRAVENOUS | Status: DC

## 2017-03-16 MED ORDER — HEPARIN (PORCINE) IN NACL 2-0.9 UNIT/ML-% IJ SOLN
INTRAMUSCULAR | Status: AC | PRN
Start: 2017-03-16 — End: 2017-03-16
  Administered 2017-03-16: 1000 mL

## 2017-03-16 MED ORDER — HEPARIN (PORCINE) IN NACL 2-0.9 UNIT/ML-% IJ SOLN
INTRAMUSCULAR | Status: AC
  Filled 2017-03-16: qty 1000

## 2017-03-16 MED ORDER — MIDAZOLAM HCL 2 MG/2ML IJ SOLN
INTRAMUSCULAR | Status: DC | PRN
  Administered 2017-03-16: 1 mg via INTRAVENOUS

## 2017-03-16 MED ORDER — MIDAZOLAM HCL 2 MG/2ML IJ SOLN
INTRAMUSCULAR | Status: AC
  Filled 2017-03-16: qty 2

## 2017-03-16 MED ORDER — SODIUM CHLORIDE 0.9 % IV SOLN
INTRAVENOUS | Status: AC | PRN
  Administered 2017-03-16: 10 mL/h via INTRAVENOUS

## 2017-03-16 MED ORDER — ENOXAPARIN SODIUM 40 MG/0.4ML ~~LOC~~ SOLN
40.0000 mg | SUBCUTANEOUS | Status: DC
  Administered 2017-03-16: 40 mg via SUBCUTANEOUS
  Filled 2017-03-16: qty 0.4

## 2017-03-16 MED ORDER — LIDOCAINE HCL 1 % IJ SOLN
INTRAMUSCULAR | Status: AC
  Filled 2017-03-16: qty 20

## 2017-03-16 MED ORDER — HEPARIN SODIUM (PORCINE) 1000 UNIT/ML IJ SOLN
INTRAMUSCULAR | Status: AC
  Filled 2017-03-16: qty 1

## 2017-03-16 MED ORDER — VERAPAMIL HCL 2.5 MG/ML IV SOLN
INTRAVENOUS | Status: AC
  Filled 2017-03-16: qty 2

## 2017-03-16 MED ORDER — SODIUM CHLORIDE 0.9% FLUSH
3.0000 mL | Freq: Two times a day (BID) | INTRAVENOUS | Status: DC
  Administered 2017-03-16 – 2017-03-17 (×2): 3 mL via INTRAVENOUS

## 2017-03-16 MED ORDER — VERAPAMIL HCL 2.5 MG/ML IV SOLN
INTRAVENOUS | Status: DC | PRN
  Administered 2017-03-16: 10 mL via INTRA_ARTERIAL

## 2017-03-16 MED ORDER — METHYLPREDNISOLONE SODIUM SUCC 40 MG IJ SOLR
30.0000 mg | Freq: Two times a day (BID) | INTRAMUSCULAR | Status: DC
  Administered 2017-03-16 – 2017-03-17 (×3): 30 mg via INTRAVENOUS
  Filled 2017-03-16 (×3): qty 1

## 2017-03-16 MED ORDER — SODIUM CHLORIDE 0.9% FLUSH: 3.0000 mL | INTRAVENOUS | Status: DC | PRN

## 2017-03-16 MED ORDER — WARFARIN SODIUM 4 MG PO TABS
4.0000 mg | ORAL_TABLET | Freq: Once | ORAL | Status: DC
  Filled 2017-03-16 (×2): qty 1

## 2017-03-16 MED ORDER — LIDOCAINE HCL (PF) 1 % IJ SOLN
INTRAMUSCULAR | Status: DC | PRN
  Administered 2017-03-16: 2 mL

## 2017-03-16 SURGICAL SUPPLY — 11 items
CATH IMPULSE 5F ANG/FL3.5 (CATHETERS) ×1 IMPLANT
DEVICE RAD COMP TR BAND LRG (VASCULAR PRODUCTS) ×1 IMPLANT
GLIDESHEATH SLEND SS 6F .021 (SHEATH) ×1 IMPLANT
GUIDEWIRE INQWIRE 1.5J.035X260 (WIRE) IMPLANT
INQWIRE 1.5J .035X260CM (WIRE) ×2
KIT HEART LEFT (KITS) ×2 IMPLANT
PACK CARDIAC CATHETERIZATION (CUSTOM PROCEDURE TRAY) ×2 IMPLANT
SYR MEDRAD MARK V 150ML (SYRINGE) ×2 IMPLANT
TRANSDUCER W/STOPCOCK (MISCELLANEOUS) ×2 IMPLANT
TUBING CIL FLEX 10 FLL-RA (TUBING) ×2 IMPLANT
WIRE HI TORQ VERSACORE-J 145CM (WIRE) ×1 IMPLANT

## 2017-03-16 NOTE — Care Management Important Message (Signed)
Important Message  Patient Details  Name: [name] MRN: [id number] Date of Birth: Mar 20, 1946   Medicare Important Message Given:  Yes    Charletha Dalpe [address] 03/16/2017, 3:37 PM

## 2017-03-16 NOTE — Consult Note (Signed)
[facility] for Infectious Disease    Date of Admission:  03/09/2017           Day 8 ceftriaxone       Reason for Consult: Coagulase-negative staph bacteremia    Referring Provider: Dr. Reyne Dumas  Assessment: He had low-grade fever and pneumonia upon admission. With staph hominis growing in both blood cultures I would treat that as true bacteremia. I will change ceftriaxone to cefazolin. I recommend 2 weeks of total antibiotic therapy for the bacteremia through 03/22/2017. If he is discharged before completing IV cefazolin with complete therapy with oral cephalexin.   Plan: 1. Change ceftriaxone to cefazolin for 6 more days. He can be discharged on oral cephalexin 500 mg 4 times daily to complete therapy if he is discharged before 03/22/2017. 2. I will sign off now  Principal Problem:   Coagulase negative Staphylococcus bacteremia Active Problems:   Acute respiratory failure (HCC)   Chronic obstructive pulmonary disease (HCC)   Pressure injury of skin   . amiodarone  200 mg Oral Daily  . aspirin  81 mg Oral Daily  . atorvastatin  80 mg Oral q1800  . chlorhexidine gluconate (MEDLINE KIT)  15 mL Mouth Rinse BID  . fluticasone furoate-vilanterol  1 puff Inhalation Daily  . folic acid  1 mg Per Tube Daily  . furosemide  40 mg Intravenous Daily  . insulin aspart  0-9 Units Subcutaneous Q4H  . losartan  25 mg Oral Daily  . mouth rinse  15 mL Mouth Rinse BID  . methylPREDNISolone (SOLU-MEDROL) injection  30 mg Intravenous Q12H  . metoprolol succinate  12.5 mg Oral Daily  . multivitamin with minerals  1 tablet Per Tube Daily  . pantoprazole  40 mg Oral Daily  . sodium chloride flush  3 mL Intravenous Q12H  . thiamine  100 mg Oral Daily  . tiotropium  18 mcg Inhalation Daily    HPI: [name] is a 71 y.o. male with multiple medical problems who was admitted on 03/09/2017 with acute respiratory distress and probable right midlung pneumonia. He was intubated in  the emergency department. He was started on empiric anabiotic therapy for community-acquired pneumonia. Both sets of blood cultures on admission grew Staph hominis. He is now extubated and doing better. It also appears that he had a NSTEMI. He is asking when he can go home.   Review of Systems: Review of Systems  Constitutional: Negative for chills, diaphoresis and fever.  Respiratory: Positive for cough, sputum production and shortness of breath.   Cardiovascular: Negative for chest pain.  Gastrointestinal: Negative for abdominal pain, diarrhea, nausea and vomiting.    Past Medical History:  Diagnosis Date  . AAA (abdominal aortic aneurysm) without rupture ([place])   . Acute respiratory failure ([place]) 03/13/2017  . CHF (congestive heart failure) ([place])   . COPD (chronic obstructive pulmonary disease) ([place])   . Coronary artery disease   . Hypertension   . Renal artery dissection [facility])     Social History  Substance Use Topics  . Smoking status: Current Every Day Smoker    Packs/day: 0.50    Years: 20.00    Types: Cigarettes  . Smokeless tobacco: Never Used  . Alcohol use Yes    History reviewed. No pertinent family history. Allergies  Allergen Reactions  . Aspirin Hives  . Ibuprofen Hives    OBJECTIVE: Blood pressure 115/68, pulse (!) 54, temperature 98.3 F (36.8 C), temperature source  Oral, resp. rate 18, height _0  (1.651 m), weight 137 lb 5.6 oz (62.3 kg), SpO2 98 %.  Physical Exam  Constitutional: He is oriented to person, place, and time.  He is sitting up in bed talking on the phone.  HENT:  Mouth/Throat: No oropharyngeal exudate.  He is edentulous.  Cardiovascular: Normal rate and regular rhythm.   Murmur heard. S1 over 6 systolic murmur  Pulmonary/Chest: Effort normal.  Few scattered rhonchi.  Abdominal: Soft. There is no tenderness.  Musculoskeletal: Normal range of motion. He exhibits no edema or tenderness.  Neurological: He is alert and oriented to  person, place, and time.  Skin: No rash noted.  Psychiatric: Mood and affect normal.    Lab Results Lab Results  Component Value Date   WBC 12.7 (H) 03/16/2017   HGB 10.3 (L) 03/16/2017   HCT 34.1 (L) 03/16/2017   MCV 81.0 03/16/2017   PLT 288 03/16/2017    Lab Results  Component Value Date   CREATININE 0.78 03/14/2017   BUN 40 (H) 03/14/2017   NA 136 03/14/2017   K 3.7 03/14/2017   CL 102 03/14/2017   CO2 27 03/14/2017    Lab Results  Component Value Date   ALT 47 03/14/2017   AST 37 03/14/2017   ALKPHOS 62 03/14/2017   BILITOT 1.2 03/14/2017     Microbiology: Recent Results (from the past 240 hour(s))  Blood culture (routine x 2)     Status: Abnormal   Collection Time: 03/09/17 11:15 PM  Result Value Ref Range Status   Specimen Description BLOOD LEFT ANTECUBITAL  Final   Special Requests   Final    BOTTLES DRAWN AEROBIC AND ANAEROBIC Blood Culture adequate volume   Culture  Setup Time   Final    GRAM POSITIVE COCCI IN CLUSTERS IN BOTH AEROBIC AND ANAEROBIC BOTTLES CRITICAL RESULT CALLED TO, READ BACK BY AND VERIFIED WITH: T RUDISILL PHARMD 1817 03/10/17 A BROWNING    Culture STAPHYLOCOCCUS HOMINIS (A)  Final   Report Status 03/13/2017 FINAL  Final   Organism ID, Bacteria STAPHYLOCOCCUS HOMINIS  Final      Susceptibility   Staphylococcus hominis - MIC*    CIPROFLOXACIN <=0.5 SENSITIVE Sensitive     ERYTHROMYCIN <=0.25 SENSITIVE Sensitive     GENTAMICIN <=0.5 SENSITIVE Sensitive     OXACILLIN <=0.25 SENSITIVE Sensitive     TETRACYCLINE <=1 SENSITIVE Sensitive     VANCOMYCIN 1 SENSITIVE Sensitive     TRIMETH/SULFA <=10 SENSITIVE Sensitive     CLINDAMYCIN <=0.25 SENSITIVE Sensitive     RIFAMPIN <=0.5 SENSITIVE Sensitive     Inducible Clindamycin NEGATIVE Sensitive     * STAPHYLOCOCCUS HOMINIS  Blood culture (routine x 2)     Status: Abnormal   Collection Time: 03/09/17 11:15 PM  Result Value Ref Range Status   Specimen Description BLOOD RIGHT ANTECUBITAL   Final   Special Requests   Final    BOTTLES DRAWN AEROBIC AND ANAEROBIC Blood Culture adequate volume   Culture  Setup Time   Final    GRAM POSITIVE COCCI IN CLUSTERS ANAEROBIC BOTTLE ONLY CRITICAL RESULT CALLED TO, READ BACK BY AND VERIFIED WITH: T RUDISILL PHARMD 1817 03/10/17 A BROWNING    Culture (A)  Final    STAPHYLOCOCCUS HOMINIS SUSCEPTIBILITIES PERFORMED ON PREVIOUS CULTURE WITHIN THE LAST 5 DAYS.    Report Status 03/13/2017 FINAL  Final  Blood Culture ID Panel (Reflexed)     Status: Abnormal   Collection Time: 03/09/17 11:15 PM  Result Value Ref Range Status   Enterococcus species NOT DETECTED NOT DETECTED Final   Listeria monocytogenes NOT DETECTED NOT DETECTED Final   Staphylococcus species DETECTED (A) NOT DETECTED Final    Comment: Methicillin (oxacillin) susceptible coagulase negative staphylococcus. Possible blood culture contaminant (unless isolated from more than one blood culture draw or clinical case suggests pathogenicity). No antibiotic treatment is indicated for blood  culture contaminants. CRITICAL RESULT CALLED TO, READ BACK BY AND VERIFIED WITH: T RUDISILL PHARMD 1817 03/10/17 A BROWNING    Staphylococcus aureus NOT DETECTED NOT DETECTED Final   Methicillin resistance NOT DETECTED NOT DETECTED Final   Streptococcus species NOT DETECTED NOT DETECTED Final   Streptococcus agalactiae NOT DETECTED NOT DETECTED Final   Streptococcus pneumoniae NOT DETECTED NOT DETECTED Final   Streptococcus pyogenes NOT DETECTED NOT DETECTED Final   Acinetobacter baumannii NOT DETECTED NOT DETECTED Final   Enterobacteriaceae species NOT DETECTED NOT DETECTED Final   Enterobacter cloacae complex NOT DETECTED NOT DETECTED Final   Escherichia coli NOT DETECTED NOT DETECTED Final   Klebsiella oxytoca NOT DETECTED NOT DETECTED Final   Klebsiella pneumoniae NOT DETECTED NOT DETECTED Final   Proteus species NOT DETECTED NOT DETECTED Final   Serratia marcescens NOT DETECTED NOT DETECTED  Final   Haemophilus influenzae NOT DETECTED NOT DETECTED Final   Neisseria meningitidis NOT DETECTED NOT DETECTED Final   Pseudomonas aeruginosa NOT DETECTED NOT DETECTED Final   Candida albicans NOT DETECTED NOT DETECTED Final   Candida glabrata NOT DETECTED NOT DETECTED Final   Candida krusei NOT DETECTED NOT DETECTED Final   Candida parapsilosis NOT DETECTED NOT DETECTED Final   Candida tropicalis NOT DETECTED NOT DETECTED Final  MRSA PCR Screening     Status: None   Collection Time: 03/10/17  5:33 AM  Result Value Ref Range Status   MRSA by PCR NEGATIVE NEGATIVE Final    Comment:        The GeneXpert MRSA Assay (FDA approved for NASAL specimens only), is one component of a comprehensive MRSA colonization surveillance program. It is not intended to diagnose MRSA infection nor to guide or monitor treatment for MRSA infections.   Culture, blood (routine x 2)     Status: None (Preliminary result)   Collection Time: 03/12/17 11:40 AM  Result Value Ref Range Status   Specimen Description BLOOD RIGHT ANTECUBITAL  Final   Special Requests   Final    BOTTLES DRAWN AEROBIC AND ANAEROBIC Blood Culture adequate volume   Culture NO GROWTH 4 DAYS  Final   Report Status PENDING  Incomplete  Culture, blood (routine x 2)     Status: Abnormal   Collection Time: 03/12/17 11:49 AM  Result Value Ref Range Status   Specimen Description BLOOD LEFT ANTECUBITAL  Final   Special Requests IN PEDIATRIC BOTTLE Blood Culture adequate volume  Final   Culture  Setup Time   Final    GRAM POSITIVE COCCI IN CLUSTERS IN PEDIATRIC BOTTLE CRITICAL VALUE NOTED.  VALUE IS CONSISTENT WITH PREVIOUSLY REPORTED AND CALLED VALUE.    Culture (A)  Final    STAPHYLOCOCCUS HOMINIS SUSCEPTIBILITIES PERFORMED ON PREVIOUS CULTURE WITHIN THE LAST 5 DAYS.    Report Status 03/15/2017 FINAL  Final    Michel Bickers, MD [facility] [place] [facility] [phone] pager   [phone]  cell 03/16/2017, 12:26 PM

## 2017-03-16 NOTE — H&P (View-Only) (Signed)
Progress Note  Patient Name: [name] Date of Encounter: 03/14/2017  Primary Cardiologist: Dr [name]  Subjective   Dyspnea improved, mild SOB this AM  Inpatient Medications    Scheduled Meds: . amiodarone  200 mg Oral Daily  . aspirin  81 mg Oral Daily  . [START ON 03/16/2017] aspirin  81 mg Oral Pre-Cath  . atorvastatin  80 mg Oral q1800  . chlorhexidine gluconate (MEDLINE KIT)  15 mL Mouth Rinse BID  . fluticasone furoate-vilanterol  1 puff Inhalation Daily  . folic acid  1 mg Per Tube Daily  . insulin aspart  0-9 Units Subcutaneous Q4H  . losartan  25 mg Oral Daily  . mouth rinse  15 mL Mouth Rinse BID  . methylPREDNISolone (SOLU-MEDROL) injection  40 mg Intravenous Q12H  . metoprolol succinate  12.5 mg Oral Daily  . multivitamin with minerals  1 tablet Per Tube Daily  . pantoprazole  40 mg Oral Daily  . sodium chloride flush  3 mL Intravenous Q12H  . thiamine  100 mg Oral Daily  . tiotropium  18 mcg Inhalation Daily   Continuous Infusions: . sodium chloride 250 mL (03/13/17 1512)  . [START ON 03/16/2017] sodium chloride    . [START ON 03/16/2017] sodium chloride    . cefTRIAXone (ROCEPHIN)  IV Stopped (03/13/17 2225)  . heparin 1,000 Units/hr (03/14/17 0630)   PRN Meds: sodium chloride, [START ON 03/16/2017] sodium chloride, docusate, hydrALAZINE, oxyCODONE, sodium chloride flush   Vital Signs    Vitals:   03/14/17 0443 03/14/17 0734 03/14/17 0758 03/14/17 0759  BP: 132/77 136/85    Pulse: 65 70    Resp: 16 18    Temp: 98.5 F (36.9 C)     TempSrc: Oral     SpO2: 90% 92% 95% 95%  Weight: 140 lb 3.4 oz (63.6 kg)     Height:        Intake/Output Summary (Last 24 hours) at 03/14/17 1019 Last data filed at 03/14/17 0700  Gross per 24 hour  Intake                0 ml  Output             1270 ml  Net            -1270 ml   Filed Weights   03/12/17 0500 03/13/17 0500 03/14/17 0443  Weight: 138 lb 14.2 oz (63 kg) 138 lb 14.2 oz (63 kg) 140 lb 3.4 oz (63.6  kg)    Telemetry    Sinus rhythm with APCs- Personally Reviewed   Physical Exam   GEN: Well nourished, well developed, in no acute distress  HEENT: normal  Neck: no JVD, carotid bruits, or masses Cardiac: RRR; 2/6 murmur at the apex, no rubs, or gallops,no edema  Respiratory:  clear to auscultation bilaterally, normal work of breathing, decreased breath sounds at the bases GI: soft, nontender, nondistended, + BS MS: no deformity or atrophy  Skin: warm and dry Neuro:  Strength and sensation are intact Psych: euthymic mood, full affect    Labs    Chemistry  Recent Labs Lab 03/09/17 2258  03/11/17 0439 03/12/17 0356 03/14/17 0414  NA 136  < > 135 138 136  K 4.7  < > 4.1 4.5 3.7  CL 103  < > 104 103 102  CO2 25  < > _0 GLUCOSE 297*  < > 168* 138* 129*  BUN 16  < >  28* 27* 40*  CREATININE 1.06  < > 0.73 0.65 0.78  CALCIUM 8.6*  < > 8.2* 8.4* 8.6*  PROT 7.0  --   --   --  6.1*  ALBUMIN 3.2*  --   --   --  2.7*  AST 24  --   --   --  37  ALT 14*  --   --   --  47  ALKPHOS 88  --   --   --  62  BILITOT 0.4  --   --   --  1.2  GFRNONAA >60  < > >60 >60 >60  GFRAA >60  < > >60 >60 >60  ANIONGAP 8  < > _0 < > = values in this interval not displayed.   Hematology  Recent Labs Lab 03/11/17 0439 03/12/17 0356 03/14/17 0414  WBC 9.5 8.1 7.3  RBC 3.25* 3.59* 4.02*  HGB 8.0* 8.7* 9.9*  HCT 26.6* 29.3* 32.8*  MCV 81.8 81.6 81.6  MCH 24.6* 24.2* 24.6*  MCHC 30.1 29.7* 30.2  RDW 16.3* 16.4* 16.9*  PLT 231 236 266    Cardiac Enzymes  Recent Labs Lab 03/09/17 2258 03/11/17 0439 03/11/17 1032 03/11/17 1650  TROPONINI 0.08* 7.86* 8.21* 6.23*    BNP  Recent Labs Lab 03/10/17 0147  BNP 412.2*      Radiology    Dg Chest Port 1 View  Result Date: 03/14/2017 CLINICAL DATA:  Patient with respiratory failure. EXAM: PORTABLE CHEST 1 VIEW COMPARISON:  Chest radiograph 03/12/2017. FINDINGS: Patient is rotated. Persistent right peritracheal opacity.  Monitoring leads overlie the patient. Stable cardiac and mediastinal contours. Interval improvement in bilateral interstitial pulmonary opacities. No pleural effusion or pneumothorax. IMPRESSION: Improving interstitial opacities favored to represent improving edema. Persistent right peritracheal opacity. Recommend comparison with outside priors if available. If none are available, consider further evaluation the non acute setting with chest CT to exclude peritracheal mass. Cardiomegaly. Electronically Signed   By: Lovey Newcomer M.D.   On: 03/14/2017 09:54   Dg Chest Port 1 View  Result Date: 03/12/2017 CLINICAL DATA:  Respiratory failure EXAM: PORTABLE CHEST 1 VIEW COMPARISON:  03/12/2017 FINDINGS: Cardiac shadow is again enlarged. Vascular congestion remains with interstitial edema. The overall appearance given some technical variation in the film is slightly worsened particularly on the left. No bony abnormality is noted. IMPRESSION: Slight increase in degree of interstitial edema. Electronically Signed   By: Inez Catalina M.D.   On: 03/12/2017 15:08    Patient Profile     71 y.o. male with past medical history of coronary artery disease, COPD, paroxysmal atrial fibrillation, ischemic cardiomyopathy admitted with respiratory failure felt possibly secondary to COPD with superimposed pneumonia or aspiration. Troponin elevated and cardiology asked to evaluate. Echocardiogram shows ejection fraction 40-45%, moderate mitral regurgitation and biatrial enlargement. Note ejection fraction essentially unchanged compared to previous.  Assessment & Plan    1 non-ST elevation myocardial infarction- thought due to demand with COPD exacerbation due to pneumonia/aspiration. That being said, troponin elevated to 8.21. Plan for cath Monday. Questions answered today.  2 history of paroxysmal atrial fibrillation- sinus rhythm today. Continue amiodarone. Restart coumadin after all procedures  3 ischemic cardiomyopathy-  on losartan/metoprolol  4 normocytic anemia-further workup per primary service.  5 COPD/pneumonia-per primary team.     Signed, Shaneque Merkle Meredith Leeds, MD  03/14/2017, 10:19 AM

## 2017-03-16 NOTE — Progress Notes (Signed)
[facility] for heparin Indication: atrial fibrillation/ACS  Allergies  Allergen Reactions  . Aspirin Hives  . Ibuprofen Hives    Patient Measurements: Height: 5\' 5"  (165.1 cm) Weight: 137 lb 5.6 oz (62.3 kg) IBW/kg (Calculated) : 61.5 Heparin Dosing Weight: 63 kg  Vital Signs: Temp: 98.9 F (37.2 C) (07/09 0358) Temp Source: Oral (07/09 0358) BP: 121/73 (07/09 0358) Pulse Rate: 54 (07/09 0358)  Labs:  Recent Labs  03/14/17 0414 03/14/17 0949 03/14/17 1141 03/15/17 0251 03/16/17 0236  HGB 9.9*  --   --  10.2* 10.3*  HCT 32.8*  --   --  33.4* 34.1*  PLT 266  --   --  270 288  LABPROT 15.2  --   --  15.2 15.1  INR 1.20  --   --  1.20 1.18  HEPARINUNFRC 0.28*  --  0.40 0.48 0.53  CREATININE 0.78  --   --   --   --   TROPONINI  --  5.55*  --   --   --     Estimated Creatinine Clearance: 75.8 mL/min (by C-G formula based on SCr of 0.78 mg/dL).   Assessment: 74 YOM continues on IV heparin for pAFib (CHADsVASc = 4) and NSTEMI vs demand ischemia.   Plans for cardiac cath today.  Heparin level remains at goal on heparin infusion at 1000 units/hr.  Hgb and plts stable, no bleeding noted  Goal of Therapy:  Heparin level 0.3-0.7 units/ml Monitor platelets by anticoagulation protocol: Yes   Plan:  -Continue heparin at 1000 units / hr -Daily heparin level and CBC -Monitor for s/s bleeding  -Follow up plans after cath -Follow ability to restart warfarin  Ember Gottwald D. Kahlie Deutscher, PharmD, BCPS Clinical Pharmacist Pager: [phone] Clinical Phone for 03/16/2017 until 3:30pm: [phone] If after 3:30pm, please call main pharmacy at [phone] 03/16/2017 10:24 AM

## 2017-03-16 NOTE — Interval H&P Note (Signed)
History and Physical Interval Note:  03/16/2017 2:13 PM  [name]  has presented today for surgery, with the diagnosis of nstemi  The various methods of treatment have been discussed with the patient and family. After consideration of risks, benefits and other options for treatment, the patient has consented to  Procedure(s): Left Heart Cath and Coronary Angiography (N/A) as a surgical intervention .  The patient's history has been reviewed, patient examined, no change in status, stable for surgery.  I have reviewed the patient's chart and labs.  Questions were answered to the patient's satisfaction.    Cath Lab Visit (complete for each Cath Lab visit)  Clinical Evaluation Leading to the Procedure:   ACS: Yes.    Non-ACS:    Anginal Classification: CCS II  Anti-ischemic medical therapy: Minimal Therapy (1 class of medications)  Non-Invasive Test Results: No non-invasive testing performed  Prior CABG: No previous CABG       [name] [facility] 03/16/2017 2:13 PM

## 2017-03-16 NOTE — Progress Notes (Signed)
[facility] for heparin Indication: atrial fibrillation/ACS  Allergies  Allergen Reactions  . Aspirin Hives  . Ibuprofen Hives    Patient Measurements: Height: 5\' 5"  (165.1 cm) Weight: 137 lb 5.6 oz (62.3 kg) IBW/kg (Calculated) : 61.5 Heparin Dosing Weight: 63 kg  Vital Signs: Temp: 98.3 F (36.8 C) (07/09 1000) Temp Source: Oral (07/09 1000) BP: 98/45 (07/09 1750) Pulse Rate: 59 (07/09 1750)  Labs:  Recent Labs  03/14/17 0414 03/14/17 0949 03/14/17 1141 03/15/17 0251 03/16/17 0236  HGB 9.9*  --   --  10.2* 10.3*  HCT 32.8*  --   --  33.4* 34.1*  PLT 266  --   --  270 288  LABPROT 15.2  --   --  15.2 15.1  INR 1.20  --   --  1.20 1.18  HEPARINUNFRC 0.28*  --  0.40 0.48 0.53  CREATININE 0.78  --   --   --   --   TROPONINI  --  5.55*  --   --   --     Estimated Creatinine Clearance: 75.8 mL/min (by C-G formula based on SCr of 0.78 mg/dL).   Assessment: 42 YOM continues on IV heparin for pAFib (CHADsVASc = 4) and NSTEMI, s/p cath, no significant change from previous cath. Plan for medical therapy. Will dc heparin, and restart coumadin.   Coumadin PTA dose: 2.5 mg daily. INR 1.14 on admission, INR 1.18 this morning. On amiodarone home dose.  Goal of Therapy:  Heparin level 0.3-0.7 units/ml Monitor platelets by anticoagulation protocol: Yes   Plan:  - Coumadin 4 mg po x 1 - Daily PT/INR  Maryanna Shape, PharmD, BCPS  Clinical Pharmacist  Pager: [phone]   03/16/2017 6:16 PM

## 2017-03-16 NOTE — Progress Notes (Signed)
[facility] PROGRESS NOTE  [name] RUE:[id number] DOB: [birth year] DOA: 03/09/2017   PCP: Ardith Dark, PA-C     Assessment/Plan: Active Problems:   Acute respiratory failure ([place])   Chronic obstructive pulmonary disease ([place])   Pressure injury of skin    [name] admitted 7/2 with complaints of shortness of breath. He was unresponsive on arrival to the ER and was intubated. The patient was noted to have vomited with concern for possible aspiration. CXR demonstrated RML opacity. EKG with incomplete LBBB. He was started on empiric abx and admitted to ICU. 7/4 noted to have elevated troponin 7.86 >8.21. Seen by cardiology, non-ST elevation MI/demand ischemia. Transfer to [facility] 7/6   Assessment and plan Acute Hypoxic Respiratory Failure /COPD exacerbation- in setting of RML opacity / PNA. CAP vs Aspiration. CTA negative for PE Patient also has a history of bronchiectasis, tobacco use Extubated 7/4, currently on  92% on room air,  Repeat CXR, appears to have improved Continue supplemental o2 w/  for sats >90% Duoneb Q4  Continue Brovana + Pulmicort  Currently on Solumedrol to 40 mg IV Q12, gradually taper    Elevated Troponin / NSTEMI- cardiology felt elevations in troponin related to known occulusions in the setting of demand ischemia. Not recommending cath at this time.  Repeat echo showing EF 40-45% and akinesis of the lateral wall and hypokinesis inferiorly with a hyperdynamic anterior wall and septum. Troponin trended down from 8.3 to 6.23 Continue aspirin, statin, beta blocker, and ACE inhibitor blood pressure allows Cardiac cath on 03/16/17   Paroxsysmal afib, stable since admission: taking amiodarone and coumadin. INR subtherapeutic to 1.14. [name] [place] [facility] Cardiology following; appreciate recommendation Cardiology recommends resuming Coumadin after cardiac cath,currently on iv heparin for cath       Anemia of chronic disease - 9.6 >  8.0>8.1>8.7>10.3 Transfuse for hemoglobin less than 8.0      RML opacity -? Aspiration PNA Hypothermia - initial rectal temp 95.7, Cultures no growth so far Continue ceftriaxone until 7/8 BCID shows staph hominis- ID consulted    Hyperglycemia, likely steroid-induced Hemoglobin A1c 6.2    Acute encephalopathy -improving  Probable ETOH use - daughter states he stopped drinking ETOH years ago Continue Folate, MVI, Thiamine QD Follow for possible withdrawal symptoms      DVT prophylaxsis Heparin  Code Status:  Full code    Family Communication: Discussed in detail with the patient, all imaging results, lab results explained to the patient   Disposition Plan: Cardiac cath    Consultants: Cardiology Critical care  Procedures: None   Antibiotics: Anti-infectives    Start     Dose/Rate Route Frequency Ordered Stop   03/11/17 0000  azithromycin (ZITHROMAX) 500 mg in dextrose 5 % 250 mL IVPB  Status:  Discontinued     500 mg 250 mL/hr over 60 Minutes Intravenous Every 24 hours 03/10/17 0057 03/11/17 1555   03/10/17 2200  cefTRIAXone (ROCEPHIN) 1 g in dextrose 5 % 50 mL IVPB     1 g 100 mL/hr over 30 Minutes Intravenous Every 24 hours 03/10/17 0100 03/15/17 2334   03/09/17 2315  cefTRIAXone (ROCEPHIN) 1 g in dextrose 5 % 50 mL IVPB     1 g 100 mL/hr over 30 Minutes Intravenous  Once 03/09/17 2308 03/10/17 0001   03/09/17 2315  azithromycin (ZITHROMAX) 500 mg in dextrose 5 % 250 mL IVPB     500 mg 250 mL/hr over 60 Minutes Intravenous  Once  03/09/17 2308 03/10/17 0120   03/09/17 2315  clindamycin (CLEOCIN) IVPB 600 mg     600 mg 100 mL/hr over 30 Minutes Intravenous  Once 03/09/17 2308 03/10/17 0000         HPI/Subjective: Patient denies, cp, sob, on RA   Objective: Vitals:   03/15/17 0830 03/15/17 1029 03/15/17 2000 03/16/17 0358  BP:  110/70 105/65 121/73  Pulse:  60 62 (!) 54  Resp:  '16 17 18  '$ Temp:  98.1 F (36.7 C) 97.9 F (36.6 C) 98.9 F  (37.2 C)  TempSrc:  Oral Oral Oral  SpO2: 95% 92% 94% 92%  Weight:   62.3 kg (137 lb 5.6 oz)   Height:        Intake/Output Summary (Last 24 hours) at 03/16/17 0900 Last data filed at 03/16/17 0644  Gross per 24 hour  Intake           907.33 ml  Output              975 ml  Net           -67.67 ml    Exam:  Examination:  General exam: cachetic   Neuro:  AAOx3, responds to commands HEENT:  AT/[place], no scleral icterus, [place] in place Cardiovascular:  RRR, no JVD or edema Lungs:  Diminished bilaterally with some minimal crackles at the bases, but overall good air movement Abdomen:  Soft, NTND Musculoskeletal:  No edema or gross deformities Extremities: SCDs in place and no edema    Data Reviewed: I have personally reviewed following labs and imaging studies  Micro Results Recent Results (from the past 240 hour(s))  Blood culture (routine x 2)     Status: Abnormal   Collection Time: 03/09/17 11:15 PM  Result Value Ref Range Status   Specimen Description BLOOD LEFT ANTECUBITAL  Final   Special Requests   Final    BOTTLES DRAWN AEROBIC AND ANAEROBIC Blood Culture adequate volume   Culture  Setup Time   Final    GRAM POSITIVE COCCI IN CLUSTERS IN BOTH AEROBIC AND ANAEROBIC BOTTLES CRITICAL RESULT CALLED TO, READ BACK BY AND VERIFIED WITH: T RUDISILL PHARMD 1817 03/10/17 A BROWNING    Culture STAPHYLOCOCCUS HOMINIS (A)  Final   Report Status 03/13/2017 FINAL  Final   Organism ID, Bacteria STAPHYLOCOCCUS HOMINIS  Final      Susceptibility   Staphylococcus hominis - MIC*    CIPROFLOXACIN <=0.5 SENSITIVE Sensitive     ERYTHROMYCIN <=0.25 SENSITIVE Sensitive     GENTAMICIN <=0.5 SENSITIVE Sensitive     OXACILLIN <=0.25 SENSITIVE Sensitive     TETRACYCLINE <=1 SENSITIVE Sensitive     VANCOMYCIN 1 SENSITIVE Sensitive     TRIMETH/SULFA <=10 SENSITIVE Sensitive     CLINDAMYCIN <=0.25 SENSITIVE Sensitive     RIFAMPIN <=0.5 SENSITIVE Sensitive     Inducible Clindamycin NEGATIVE  Sensitive     * STAPHYLOCOCCUS HOMINIS  Blood culture (routine x 2)     Status: Abnormal   Collection Time: 03/09/17 11:15 PM  Result Value Ref Range Status   Specimen Description BLOOD RIGHT ANTECUBITAL  Final   Special Requests   Final    BOTTLES DRAWN AEROBIC AND ANAEROBIC Blood Culture adequate volume   Culture  Setup Time   Final    GRAM POSITIVE COCCI IN CLUSTERS ANAEROBIC BOTTLE ONLY CRITICAL RESULT CALLED TO, READ BACK BY AND VERIFIED WITH: T RUDISILL PHARMD 1817 03/10/17 A BROWNING    Culture (A)  Final  STAPHYLOCOCCUS HOMINIS SUSCEPTIBILITIES PERFORMED ON PREVIOUS CULTURE WITHIN THE LAST 5 DAYS.    Report Status 03/13/2017 FINAL  Final  Blood Culture ID Panel (Reflexed)     Status: Abnormal   Collection Time: 03/09/17 11:15 PM  Result Value Ref Range Status   Enterococcus species NOT DETECTED NOT DETECTED Final   Listeria monocytogenes NOT DETECTED NOT DETECTED Final   Staphylococcus species DETECTED (A) NOT DETECTED Final    Comment: Methicillin (oxacillin) susceptible coagulase negative staphylococcus. Possible blood culture contaminant (unless isolated from more than one blood culture draw or clinical case suggests pathogenicity). No antibiotic treatment is indicated for blood  culture contaminants. CRITICAL RESULT CALLED TO, READ BACK BY AND VERIFIED WITH: T RUDISILL PHARMD 1817 03/10/17 A BROWNING    Staphylococcus aureus NOT DETECTED NOT DETECTED Final   Methicillin resistance NOT DETECTED NOT DETECTED Final   Streptococcus species NOT DETECTED NOT DETECTED Final   Streptococcus agalactiae NOT DETECTED NOT DETECTED Final   Streptococcus pneumoniae NOT DETECTED NOT DETECTED Final   Streptococcus pyogenes NOT DETECTED NOT DETECTED Final   Acinetobacter baumannii NOT DETECTED NOT DETECTED Final   Enterobacteriaceae species NOT DETECTED NOT DETECTED Final   Enterobacter cloacae complex NOT DETECTED NOT DETECTED Final   Escherichia coli NOT DETECTED NOT DETECTED Final    Klebsiella oxytoca NOT DETECTED NOT DETECTED Final   Klebsiella pneumoniae NOT DETECTED NOT DETECTED Final   Proteus species NOT DETECTED NOT DETECTED Final   Serratia marcescens NOT DETECTED NOT DETECTED Final   Haemophilus influenzae NOT DETECTED NOT DETECTED Final   Neisseria meningitidis NOT DETECTED NOT DETECTED Final   Pseudomonas aeruginosa NOT DETECTED NOT DETECTED Final   Candida albicans NOT DETECTED NOT DETECTED Final   Candida glabrata NOT DETECTED NOT DETECTED Final   Candida krusei NOT DETECTED NOT DETECTED Final   Candida parapsilosis NOT DETECTED NOT DETECTED Final   Candida tropicalis NOT DETECTED NOT DETECTED Final  MRSA PCR Screening     Status: None   Collection Time: 03/10/17  5:33 AM  Result Value Ref Range Status   MRSA by PCR NEGATIVE NEGATIVE Final    Comment:        The GeneXpert MRSA Assay (FDA approved for NASAL specimens only), is one component of a comprehensive MRSA colonization surveillance program. It is not intended to diagnose MRSA infection nor to guide or monitor treatment for MRSA infections.   Culture, blood (routine x 2)     Status: None (Preliminary result)   Collection Time: 03/12/17 11:40 AM  Result Value Ref Range Status   Specimen Description BLOOD RIGHT ANTECUBITAL  Final   Special Requests   Final    BOTTLES DRAWN AEROBIC AND ANAEROBIC Blood Culture adequate volume   Culture NO GROWTH 3 DAYS  Final   Report Status PENDING  Incomplete  Culture, blood (routine x 2)     Status: Abnormal   Collection Time: 03/12/17 11:49 AM  Result Value Ref Range Status   Specimen Description BLOOD LEFT ANTECUBITAL  Final   Special Requests IN PEDIATRIC BOTTLE Blood Culture adequate volume  Final   Culture  Setup Time   Final    GRAM POSITIVE COCCI IN CLUSTERS IN PEDIATRIC BOTTLE CRITICAL VALUE NOTED.  VALUE IS CONSISTENT WITH PREVIOUSLY REPORTED AND CALLED VALUE.    Culture (A)  Final    STAPHYLOCOCCUS HOMINIS SUSCEPTIBILITIES PERFORMED ON  PREVIOUS CULTURE WITHIN THE LAST 5 DAYS.    Report Status 03/15/2017 FINAL  Final    Radiology Reports Ct Angio  Chest Pe W Or Wo Contrast  Result Date: 03/10/2017 CLINICAL DATA:  [name]; shortness of breath and unresponsiveness. History of COPD and congestive heart failure. EXAM: CT ANGIOGRAPHY CHEST WITH CONTRAST TECHNIQUE: Multidetector CT imaging of the chest was performed using the standard protocol during bolus administration of intravenous contrast. Multiplanar CT image reconstructions and MIPs were obtained to evaluate the vascular anatomy. CONTRAST:  70 cc Isovue 370 COMPARISON:  03/09/2017 chest radiograph FINDINGS: Cardiovascular: Mild cardiomegaly. Severe coronary artery calcification. Moderate calcific atherosclerosis of the thoracic aorta. Satisfactory opacification of the pulmonary arteries. No pulmonary embolus identified. Mediastinum/Nodes: No enlarged mediastinal, hilar, or axillary lymph nodes. Thyroid gland, trachea, and esophagus demonstrate no significant findings. Lungs/Pleura: Severe emphysema with lung apex predominance. Right upper lobe ground-glass opacity measures 12 mm (series 8, image 78). Left lower lobe solid 7 mm nodule (series 8, image 113). Bilateral lower lobe dependent consolidations, diffuse peribronchial thickening and scattered mucous plugging may represent pneumonia or aspiration. Small bilateral pleural effusions. Upper Abdomen: No acute abnormality. Musculoskeletal: No chest wall abnormality. No acute or significant osseous findings. Review of the MIP images confirms the above findings. IMPRESSION: 1. No pulmonary embolus identified. 2. Dependent consolidations with mucous plugging in the lung bases may represent pneumonia or aspiration. Small bilateral pleural effusions. 3. Mild cardiomegaly and severe coronary artery calcification. 4. 12 mm right upper lobe ground-glass nodule and 7 mm left lower lobe solid nodule. Non-contrast chest CT at 3-6 months is  recommended. If the nodules are stable at time of repeat CT, then future CT at 18-24 months (from today's scan) is considered optional for low-risk patients, but is recommended for high-risk patients. This recommendation follows the consensus statement: Guidelines for Management of Incidental Pulmonary Nodules Detected on CT Images: From the Fleischner Society 2017; Radiology 2017; 284:228-243. Electronically Signed   By: Mitzi Hansen M.D.   On: 03/10/2017 03:43   Dg Chest Port 1 View  Result Date: 03/14/2017 CLINICAL DATA:  Patient with respiratory failure. EXAM: PORTABLE CHEST 1 VIEW COMPARISON:  Chest radiograph 03/12/2017. FINDINGS: Patient is rotated. Persistent right peritracheal opacity. Monitoring leads overlie the patient. Stable cardiac and mediastinal contours. Interval improvement in bilateral interstitial pulmonary opacities. No pleural effusion or pneumothorax. IMPRESSION: Improving interstitial opacities favored to represent improving edema. Persistent right peritracheal opacity. Recommend comparison with outside priors if available. If none are available, consider further evaluation the non acute setting with chest CT to exclude peritracheal mass. Cardiomegaly. Electronically Signed   By: Annia Belt M.D.   On: 03/14/2017 09:54   Dg Chest Port 1 View  Result Date: 03/12/2017 CLINICAL DATA:  Respiratory failure EXAM: PORTABLE CHEST 1 VIEW COMPARISON:  03/12/2017 FINDINGS: Cardiac shadow is again enlarged. Vascular congestion remains with interstitial edema. The overall appearance given some technical variation in the film is slightly worsened particularly on the left. No bony abnormality is noted. IMPRESSION: Slight increase in degree of interstitial edema. Electronically Signed   By: Alcide Clever M.D.   On: 03/12/2017 15:08   Dg Chest Port 1 View  Result Date: 03/12/2017 CLINICAL DATA:  Respiratory failure. EXAM: PORTABLE CHEST 1 VIEW COMPARISON:  No prior. FINDINGS: Mild  mediastinal prominence most likely prominent great vessels. Cardiomegaly with pulmonary venous congestion and interstitial prominence suggesting mild CHF. Bibasilar atelectasis and infiltrates/edema. Tiny bilateral pleural effusions cannot be excluded. No pneumothorax. IMPRESSION: 1. Congestive heart failure bilateral interstitial edema. Tiny bilateral pleural effusions cannot be excluded. 2.  Bibasilar atelectasis and infiltrates/edema. Electronically Signed   By:  [place]   On: 03/12/2017 06:20   Dg Chest Portable 1 View  Result Date: 03/09/2017 CLINICAL DATA:  Endotracheal tube placement and orogastric tube placement. Initial encounter. EXAM: PORTABLE CHEST 1 VIEW COMPARISON:  None. FINDINGS: The patient's endotracheal tube is seen ending 6 cm above the carina. An enteric tube is noted extending below the diaphragm. Right midlung airspace opacity may reflect pneumonia. Underlying interstitial prominence and bilateral fibrotic change are noted. No definite pleural effusion or pneumothorax is seen. The cardiomediastinal silhouette is mildly enlarged. No acute osseous abnormalities are identified. IMPRESSION: 1. Endotracheal tube seen ending 6 cm above the carina. Enteric tube noted extending below the diaphragm. 2. Right midlung airspace opacity raises concern for pneumonia. 3. Underlying interstitial prominence and bilateral fibrosis noted. 4. Mild cardiomegaly. Electronically Signed   By: Garald Balding M.D.   On: 03/09/2017 23:08     CBC  Recent Labs Lab 03/11/17 0439 03/12/17 0356 03/14/17 0414 03/15/17 0251 03/16/17 0236  WBC 9.5 8.1 7.3 7.3 12.7*  HGB 8.0* 8.7* 9.9* 10.2* 10.3*  HCT 26.6* 29.3* 32.8* 33.4* 34.1*  PLT 231 236 266 270 288  MCV 81.8 81.6 81.6 80.1 81.0  MCH 24.6* 24.2* 24.6* 24.5* 24.5*  MCHC 30.1 29.7* 30.2 30.5 30.2  RDW 16.3* 16.4* 16.9* 16.4* 16.5*  LYMPHSABS 0.4*  --   --   --   --   MONOABS 0.3  --   --   --   --   EOSABS 0.0  --   --   --   --   BASOSABS  0.0  --   --   --   --     Chemistries   Recent Labs Lab 03/09/17 2258 03/10/17 0147 03/10/17 0533 03/11/17 0439 03/12/17 0356 03/14/17 0414  NA 136  --  137 135 138 136  K 4.7  --  3.6 4.1 4.5 3.7  CL 103  --  106 104 103 102  CO2 25  --  '26 26 28 27  '$ GLUCOSE 297*  --  107* 168* 138* 129*  BUN 16  --  15 28* 27* 40*  CREATININE 1.06  --  0.89 0.73 0.65 0.78  CALCIUM 8.6*  --  7.9* 8.2* 8.4* 8.6*  MG  --  2.3  --   --  2.2  --   AST 24  --   --   --   --  37  ALT 14*  --   --   --   --  47  ALKPHOS 88  --   --   --   --  62  BILITOT 0.4  --   --   --   --  1.2   ------------------------------------------------------------------------------------------------------------------ estimated creatinine clearance is 75.8 mL/min (by C-G formula based on SCr of 0.78 mg/dL). ------------------------------------------------------------------------------------------------------------------ No results for input(s): HGBA1C in the last 72 hours. ------------------------------------------------------------------------------------------------------------------ No results for input(s): CHOL, HDL, LDLCALC, TRIG, CHOLHDL, LDLDIRECT in the last 72 hours. ------------------------------------------------------------------------------------------------------------------ No results for input(s): TSH, T4TOTAL, T3FREE, THYROIDAB in the last 72 hours.  Invalid input(s): FREET3 ------------------------------------------------------------------------------------------------------------------ No results for input(s): VITAMINB12, FOLATE, FERRITIN, TIBC, IRON, RETICCTPCT in the last 72 hours.  Coagulation profile  Recent Labs Lab 03/10/17 0533 03/13/17 0912 03/14/17 0414 03/15/17 0251 03/16/17 0236  INR 1.14 1.07 1.20 1.20 1.18    No results for input(s): DDIMER in the last 72 hours.  Cardiac Enzymes  Recent Labs Lab 03/11/17 1032 03/11/17 1650 03/14/17 0949  TROPONINI 8.21* 6.23* 5.55*    ------------------------------------------------------------------------------------------------------------------  Invalid input(s): POCBNP   CBG:  Recent Labs Lab 03/15/17 1803 03/15/17 2004 03/16/17 0001 03/16/17 0401 03/16/17 0757  GLUCAP 189* 158* 190* 163* 131*       Studies: Dg Chest Port 1 View  Result Date: 03/14/2017 CLINICAL DATA:  Patient with respiratory failure. EXAM: PORTABLE CHEST 1 VIEW COMPARISON:  Chest radiograph 03/12/2017. FINDINGS: Patient is rotated. Persistent right peritracheal opacity. Monitoring leads overlie the patient. Stable cardiac and mediastinal contours. Interval improvement in bilateral interstitial pulmonary opacities. No pleural effusion or pneumothorax. IMPRESSION: Improving interstitial opacities favored to represent improving edema. Persistent right peritracheal opacity. Recommend comparison with outside priors if available. If none are available, consider further evaluation the non acute setting with chest CT to exclude peritracheal mass. Cardiomegaly. Electronically Signed   By: Lovey Newcomer M.D.   On: 03/14/2017 09:54      Lab Results  Component Value Date   HGBA1C 6.2 (H) 03/10/2017   Lab Results  Component Value Date   CREATININE 0.78 03/14/2017       Scheduled Meds: . amiodarone  200 mg Oral Daily  . aspirin  81 mg Oral Daily  . atorvastatin  80 mg Oral q1800  . chlorhexidine gluconate (MEDLINE KIT)  15 mL Mouth Rinse BID  . fluticasone furoate-vilanterol  1 puff Inhalation Daily  . folic acid  1 mg Per Tube Daily  . furosemide  40 mg Intravenous Daily  . insulin aspart  0-9 Units Subcutaneous Q4H  . losartan  25 mg Oral Daily  . mouth rinse  15 mL Mouth Rinse BID  . methylPREDNISolone (SOLU-MEDROL) injection  40 mg Intravenous Q12H  . metoprolol succinate  12.5 mg Oral Daily  . multivitamin with minerals  1 tablet Per Tube Daily  . pantoprazole  40 mg Oral Daily  . sodium chloride flush  3 mL Intravenous Q12H  .  thiamine  100 mg Oral Daily  . tiotropium  18 mcg Inhalation Daily   Continuous Infusions: . sodium chloride 250 mL (03/13/17 1512)  . sodium chloride    . sodium chloride 10 mL/hr at 03/15/17 2305  . heparin 1,000 Units/hr (03/15/17 1819)     LOS: 6 days    Time spent: >30 MINS    Reyne Dumas  [facility] Pager [phone]. If 7PM-7AM, please contact night-coverage at [email or web address], password [facility] 03/16/2017, 9:00 AM  LOS: 6 days

## 2017-03-16 NOTE — Progress Notes (Signed)
Progress Note  Patient Name: [name] Date of Encounter: 03/16/2017  Primary Cardiologist: Dr [name]  Subjective   Denies SOB or CP. Voice is soft and he is a little hoarse since extubated. Not sure he can quit smoking.  Inpatient Medications    Scheduled Meds: . amiodarone  200 mg Oral Daily  . aspirin  81 mg Oral Daily  . atorvastatin  80 mg Oral q1800  . chlorhexidine gluconate (MEDLINE KIT)  15 mL Mouth Rinse BID  . fluticasone furoate-vilanterol  1 puff Inhalation Daily  . folic acid  1 mg Per Tube Daily  . furosemide  40 mg Intravenous Daily  . insulin aspart  0-9 Units Subcutaneous Q4H  . losartan  25 mg Oral Daily  . mouth rinse  15 mL Mouth Rinse BID  . methylPREDNISolone (SOLU-MEDROL) injection  30 mg Intravenous Q12H  . metoprolol succinate  12.5 mg Oral Daily  . multivitamin with minerals  1 tablet Per Tube Daily  . pantoprazole  40 mg Oral Daily  . sodium chloride flush  3 mL Intravenous Q12H  . thiamine  100 mg Oral Daily  . tiotropium  18 mcg Inhalation Daily   Continuous Infusions: . sodium chloride 250 mL (03/13/17 1512)  . sodium chloride    . sodium chloride 100 mL/hr at 03/16/17 1107  . heparin 1,000 Units/hr (03/15/17 1819)   PRN Meds: sodium chloride, sodium chloride, docusate, hydrALAZINE, oxyCODONE, sodium chloride flush   Vital Signs    Vitals:   03/15/17 1029 03/15/17 2000 03/16/17 0358 03/16/17 1000  BP: 110/70 105/65 121/73 115/68  Pulse: 60 62 (!) 54 (!) 54  Resp: _0 Temp: 98.1 F (36.7 C) 97.9 F (36.6 C) 98.9 F (37.2 C) 98.3 F (36.8 C)  TempSrc: Oral Oral Oral Oral  SpO2: 92% 94% 92% 98%  Weight:  137 lb 5.6 oz (62.3 kg)    Height:        Intake/Output Summary (Last 24 hours) at 03/16/17 1211 Last data filed at 03/16/17 0900  Gross per 24 hour  Intake           904.33 ml  Output              725 ml  Net           179.33 ml   Filed Weights   03/14/17 0443 03/14/17 2019 03/15/17 2000  Weight: 140 lb  3.4 oz (63.6 kg) 139 lb 5.3 oz (63.2 kg) 137 lb 5.6 oz (62.3 kg)    Telemetry    Sinus rhythm, sinus brady last 48 hr - Personally Reviewed   Physical Exam   GEN: Well nourished, well developed, in no acute distress  HEENT: normal  Neck: no JVD, no carotid bruits, or masses Cardiac: RRR; 2/6 murmur at the apex, no rubs, or gallops,no edema  Respiratory: decreased BS bases bilaterally, normal work of breathing, no wheeze GI: soft, nontender, nondistended, + BS MS: no deformity or atrophy  Skin: warm and dry Neuro:  Strength and sensation are intact Psych: euthymic mood, full affect    Labs    Chemistry  Recent Labs Lab 03/09/17 2258  03/11/17 0439 03/12/17 0356 03/14/17 0414  NA 136  < > 135 138 136  K 4.7  < > 4.1 4.5 3.7  CL 103  < > 104 103 102  CO2 25  < > _1 GLUCOSE 297*  < > 168* 138* 129*  BUN 16  < >  28* 27* 40*  CREATININE 1.06  < > 0.73 0.65 0.78  CALCIUM 8.6*  < > 8.2* 8.4* 8.6*  PROT 7.0  --   --   --  6.1*  ALBUMIN 3.2*  --   --   --  2.7*  AST 24  --   --   --  37  ALT 14*  --   --   --  47  ALKPHOS 88  --   --   --  62  BILITOT 0.4  --   --   --  1.2  GFRNONAA >60  < > >60 >60 >60  GFRAA >60  < > >60 >60 >60  ANIONGAP 8  < > _0 < > = values in this interval not displayed.   Hematology  Recent Labs Lab 03/14/17 0414 03/15/17 0251 03/16/17 0236  WBC 7.3 7.3 12.7*  RBC 4.02* 4.17* 4.21*  HGB 9.9* 10.2* 10.3*  HCT 32.8* 33.4* 34.1*  MCV 81.6 80.1 81.0  MCH 24.6* 24.5* 24.5*  MCHC 30.2 30.5 30.2  RDW 16.9* 16.4* 16.5*  PLT 266 270 288    Cardiac Enzymes  Recent Labs Lab 03/11/17 0439 03/11/17 1032 03/11/17 1650 03/14/17 0949  TROPONINI 7.86* 8.21* 6.23* 5.55*    BNP  Recent Labs Lab 03/10/17 0147 03/14/17 0949 03/15/17 0251  BNP 412.2* 1,130.7* 793.5*      Radiology    No results found.  Patient Profile     71 y.o. male with past medical history of CAD, COPD, PAF, ICM admitted 07/02 with respiratory  failure felt possibly secondary to COPD with superimposed pneumonia or aspiration. ETT 07/02-07/04.Troponin elevated and cardiology asked to evaluate. Echo w/ EF 40-45%, moderate mitral regurgitation and biatrial enlargement. Note ejection fraction essentially unchanged compared to previous.  Assessment & Plan    1 non-ST elevation myocardial infarction- thought due to demand with COPD exacerbation due to pneumonia/aspiration. That being said, troponin elevated to 8.21. Plan for cath this pm. No Questions today.  2 history of paroxysmal atrial fibrillation- maintaining SR. Continue amiodarone. Restart coumadin after procedures  3 ischemic cardiomyopathy- on losartan/metoprolol  4 normocytic anemia-further workup per primary service.  5 COPD/pneumonia-per primary team.  6. Hoarseness post-ETT: Per IM, may need swallow eval.   Signed, Barrett, Rhonda, PA-C  03/16/2017, 12:11 PM

## 2017-03-16 NOTE — Progress Notes (Signed)
TR BAND REMOVAL  LOCATION:    Right radial  DEFLATED PER PROTOCOL:    Yes.    TIME BAND OFF / DRESSING APPLIED:    1805p   SITE UPON ARRIVAL:    Level 0  SITE AFTER BAND REMOVAL:    Level 0  CIRCULATION SENSATION AND MOVEMENT:    Within Normal Limits   Yes.    COMMENTS:   2+Radial pulse, pt able to move fingers without any discomfort.

## 2017-03-17 ENCOUNTER — Encounter (HOSPITAL_COMMUNITY): Payer: Self-pay | Admitting: Cardiology

## 2017-03-17 ENCOUNTER — Telehealth: Payer: Self-pay | Admitting: Cardiovascular Disease

## 2017-03-17 DIAGNOSIS — R7881 Bacteremia: Secondary | ICD-10-CM

## 2017-03-17 DIAGNOSIS — I214 Non-ST elevation (NSTEMI) myocardial infarction: Secondary | ICD-10-CM

## 2017-03-17 LAB — CBC
HCT: 33.2 % — ABNORMAL LOW (ref 39.0–52.0)
Hemoglobin: 10 g/dL — ABNORMAL LOW (ref 13.0–17.0)
MCH: 24.5 pg — ABNORMAL LOW (ref 26.0–34.0)
MCHC: 30.1 g/dL (ref 30.0–36.0)
MCV: 81.4 fL (ref 78.0–100.0)
PLATELETS: 243 10*3/uL (ref 150–400)
RBC: 4.08 MIL/uL — ABNORMAL LOW (ref 4.22–5.81)
RDW: 16.5 % — AB (ref 11.5–15.5)
WBC: 11.2 10*3/uL — AB (ref 4.0–10.5)

## 2017-03-17 LAB — GLUCOSE, CAPILLARY
Glucose-Capillary: 140 mg/dL — ABNORMAL HIGH (ref 65–99)
Glucose-Capillary: 125 mg/dL — ABNORMAL HIGH (ref 65–99)
Glucose-Capillary: 158 mg/dL — ABNORMAL HIGH (ref 65–99)

## 2017-03-17 LAB — BASIC METABOLIC PANEL
ANION GAP: 8 (ref 5–15)
BUN: 38 mg/dL — ABNORMAL HIGH (ref 6–20)
CALCIUM: 8.6 mg/dL — AB (ref 8.9–10.3)
CO2: 28 mmol/L (ref 22–32)
CREATININE: 0.81 mg/dL (ref 0.61–1.24)
Chloride: 99 mmol/L — ABNORMAL LOW (ref 101–111)
Glucose, Bld: 140 mg/dL — ABNORMAL HIGH (ref 65–99)
Potassium: 3.8 mmol/L (ref 3.5–5.1)
SODIUM: 135 mmol/L (ref 135–145)

## 2017-03-17 LAB — CULTURE, BLOOD (ROUTINE X 2)
Culture: NO GROWTH
SPECIAL REQUESTS: ADEQUATE

## 2017-03-17 LAB — PROTIME-INR
INR: 1.1
PROTHROMBIN TIME: 14.3 s (ref 11.4–15.2)

## 2017-03-17 MED ORDER — METOPROLOL SUCCINATE ER 25 MG PO TB24: 12.5000 mg | ORAL_TABLET | Freq: Every day | ORAL | 2 refills | Status: DC

## 2017-03-17 MED ORDER — CEPHALEXIN 500 MG PO CAPS
500.0000 mg | ORAL_CAPSULE | Freq: Four times a day (QID) | ORAL | Status: DC
  Administered 2017-03-17: 500 mg via ORAL
  Filled 2017-03-17: qty 1

## 2017-03-17 MED ORDER — THIAMINE HCL 100 MG PO TABS: 100.0000 mg | ORAL_TABLET | Freq: Every day | ORAL | 1 refills | Status: AC

## 2017-03-17 MED ORDER — ENOXAPARIN SODIUM 100 MG/ML ~~LOC~~ SOLN
90.0000 mg | SUBCUTANEOUS | Status: DC
  Administered 2017-03-17: 90 mg via SUBCUTANEOUS
  Filled 2017-03-17: qty 1

## 2017-03-17 MED ORDER — CEPHALEXIN 500 MG PO CAPS: 500.0000 mg | ORAL_CAPSULE | Freq: Four times a day (QID) | ORAL | 0 refills | Status: AC

## 2017-03-17 MED ORDER — FOLIC ACID 1 MG PO TABS: 1.0000 mg | ORAL_TABLET | Freq: Every day | ORAL | 1 refills | Status: DC

## 2017-03-17 MED ORDER — LOSARTAN POTASSIUM 25 MG PO TABS: 25.0000 mg | ORAL_TABLET | Freq: Every day | ORAL | 2 refills | Status: DC

## 2017-03-17 MED ORDER — PANTOPRAZOLE SODIUM 40 MG PO TBEC: 40.0000 mg | DELAYED_RELEASE_TABLET | Freq: Every day | ORAL | 1 refills | Status: DC

## 2017-03-17 MED ORDER — ISOSORBIDE MONONITRATE ER 30 MG PO TB24
15.0000 mg | ORAL_TABLET | Freq: Every day | ORAL | Status: DC
  Administered 2017-03-17: 15 mg via ORAL
  Filled 2017-03-17: qty 1

## 2017-03-17 MED ORDER — WARFARIN SODIUM 4 MG PO TABS
4.0000 mg | ORAL_TABLET | Freq: Once | ORAL | Status: DC
  Filled 2017-03-17: qty 1

## 2017-03-17 MED ORDER — ISOSORBIDE MONONITRATE ER 30 MG PO TB24: 15.0000 mg | ORAL_TABLET | Freq: Every day | ORAL | 2 refills | Status: DC

## 2017-03-17 MED ORDER — ENOXAPARIN SODIUM 100 MG/ML ~~LOC~~ SOLN: 90.0000 mg | SUBCUTANEOUS | 0 refills | Status: DC

## 2017-03-17 MED ORDER — WARFARIN SODIUM 2.5 MG PO TABS: ORAL_TABLET | ORAL | 0 refills | Status: DC

## 2017-03-17 NOTE — Progress Notes (Signed)
[name] to be D/C'd Home per MD order.  Discussed prescriptions and follow up appointments with the patient. Prescriptions given to patient, medication list explained in detail. Pt verbalized understanding.  Allergies as of 03/17/2017      Reactions   Aspirin Hives   Ibuprofen Hives      Medication List    STOP taking these medications   lisinopril 10 MG tablet Commonly known as:  PRINIVIL,ZESTRIL   metoprolol tartrate 25 MG tablet Commonly known as:  LOPRESSOR     TAKE these medications   amiodarone 200 MG tablet Commonly known as:  PACERONE Take 200 mg by mouth daily.   aspirin EC 81 MG tablet Take 81 mg by mouth daily.   atorvastatin 80 MG tablet Commonly known as:  LIPITOR Take 80 mg by mouth every evening.   BREO ELLIPTA 100-25 MCG/INH Aepb Generic drug:  fluticasone furoate-vilanterol Inhale 1 puff into the lungs daily.   budesonide-formoterol 160-4.5 MCG/ACT inhaler Commonly known as:  SYMBICORT Inhale 2 puffs into the lungs 2 (two) times daily.   cephALEXin 500 MG capsule Commonly known as:  KEFLEX Take 1 capsule (500 mg total) by mouth every 6 (six) hours.   enoxaparin 100 MG/ML injection Commonly known as:  LOVENOX Inject 0.9 mLs (90 mg total) into the skin daily.   escitalopram 20 MG tablet Commonly known as:  LEXAPRO Take 20 mg by mouth daily.   folic acid 1 MG tablet Commonly known as:  FOLVITE Place 1 tablet (1 mg total) into feeding tube daily. Start taking on:  03/18/2017   isosorbide mononitrate 30 MG 24 hr tablet Commonly known as:  IMDUR Take 0.5 tablets (15 mg total) by mouth daily.   losartan 25 MG tablet Commonly known as:  COZAAR Take 1 tablet (25 mg total) by mouth daily. Start taking on:  03/18/2017   metoprolol succinate 25 MG 24 hr tablet Commonly known as:  TOPROL-XL Take 0.5 tablets (12.5 mg total) by mouth daily. Start taking on:  03/18/2017   mirtazapine 30 MG disintegrating tablet Commonly known as:  REMERON  SOL-TAB Take 30 mg by mouth at bedtime.   nitroGLYCERIN 0.4 MG SL tablet Commonly known as:  NITROSTAT Place 0.4 mg under the tongue every 5 (five) minutes as needed for chest pain.   oxyCODONE 5 MG immediate release tablet Commonly known as:  Oxy IR/ROXICODONE Take 15 mg by mouth every 4 (four) hours. For chronic post op pain   pantoprazole 40 MG tablet Commonly known as:  PROTONIX Take 1 tablet (40 mg total) by mouth daily. Start taking on:  03/18/2017   SPIRIVA HANDIHALER 18 MCG inhalation capsule Generic drug:  tiotropium Place 18 mcg into inhaler and inhale daily.   thiamine 100 MG tablet Take 1 tablet (100 mg total) by mouth daily. Start taking on:  03/18/2017   tiZANidine 4 MG tablet Commonly known as:  ZANAFLEX Take 4-8 mg by mouth See admin instructions. Take 8 mg every morning  Take 4 mg at bedtime   warfarin 2.5 MG tablet Commonly known as:  COUMADIN 2.5 mg -T,TH ,SAT 5 MG m-w-f-sun What changed:  how much to take  how to take this  when to take this  additional instructions       Vitals:   03/17/17 1000 03/17/17 1030  BP: 131/72   Pulse: (!) 51 70  Resp: 18   Temp: 98 F (36.7 C)     Skin clean, dry and intact without evidence of skin  break down, no evidence of skin tears noted. IV catheter discontinued intact. Site without signs and symptoms of complications. Dressing and pressure applied. Pt denies pain at this time. No complaints noted.  An After Visit Summary was printed and given to the patient. Patient escorted via [place], and D/C home via private auto.  Emilio Math, RN [facility] Phone [phone]

## 2017-03-17 NOTE — Discharge Summary (Addendum)
Physician Discharge Summary  [name] MRN: [id number] DOB/AGE: 1946/06/08 71 y.o.  PCP: Ardith Dark, PA-C   Admit date: 03/09/2017 Discharge date: 03/17/2017  Discharge Diagnoses:    Principal Problem:   Coagulase negative Staphylococcus bacteremia Active Problems:   Acute respiratory failure (HCC)   Chronic obstructive pulmonary disease (HCC)   Pressure injury of skin   Elevated troponin   Demand ischemia of myocardium: Severe 3 vessel CAD, with hypoxic respiratory failure    Follow-up recommendations Follow-up with PCP in 3-5 days , including all  additional recommended appointments as below Follow-up CBC, CMP in 3-5 days ENT evaluation outpatient if  the patient's hoarseness does not improve Patient recommended to be on a low-sodium diet as outpatient Next INR check 7/12 Outpatient FEES recommended by speech       Current Discharge Medication List    START taking these medications   Details  cephALEXin (KEFLEX) 500 MG capsule Take 1 capsule (500 mg total) by mouth every 6 (six) hours. Qty: 22 capsule, Refills: 0    enoxaparin (LOVENOX) 100 MG/ML injection Inject 0.9 mLs (90 mg total) into the skin daily. Qty: 7 Syringe, Refills: 0    folic acid (FOLVITE) 1 MG tablet Place 1 tablet (1 mg total) into feeding tube daily. Qty: 30 tablet, Refills: 1    isosorbide mononitrate (IMDUR) 30 MG 24 hr tablet Take 0.5 tablets (15 mg total) by mouth daily. Qty: 30 tablet, Refills: 2    losartan (COZAAR) 25 MG tablet Take 1 tablet (25 mg total) by mouth daily. Qty: 30 tablet, Refills: 2    metoprolol succinate (TOPROL-XL) 25 MG 24 hr tablet Take 0.5 tablets (12.5 mg total) by mouth daily. Qty: 30 tablet, Refills: 2    pantoprazole (PROTONIX) 40 MG tablet Take 1 tablet (40 mg total) by mouth daily. Qty: 30 tablet, Refills: 1    thiamine 100 MG tablet Take 1 tablet (100 mg total) by mouth daily. Qty: 30 tablet, Refills: 1      CONTINUE these medications  which have CHANGED   Details  warfarin (COUMADIN) 2.5 MG tablet 2.5 mg -T,TH ,SAT 5 MG m-w-f-sun Qty: 60 tablet, Refills: 0      CONTINUE these medications which have NOT CHANGED   Details  amiodarone (PACERONE) 200 MG tablet Take 200 mg by mouth daily.    aspirin EC 81 MG tablet Take 81 mg by mouth daily.    atorvastatin (LIPITOR) 80 MG tablet Take 80 mg by mouth every evening.    budesonide-formoterol (SYMBICORT) 160-4.5 MCG/ACT inhaler Inhale 2 puffs into the lungs 2 (two) times daily.    escitalopram (LEXAPRO) 20 MG tablet Take 20 mg by mouth daily.    fluticasone furoate-vilanterol (BREO ELLIPTA) 100-25 MCG/INH AEPB Inhale 1 puff into the lungs daily.    mirtazapine (REMERON SOL-TAB) 30 MG disintegrating tablet Take 30 mg by mouth at bedtime.    nitroGLYCERIN (NITROSTAT) 0.4 MG SL tablet Place 0.4 mg under the tongue every 5 (five) minutes as needed for chest pain.    tiotropium (SPIRIVA HANDIHALER) 18 MCG inhalation capsule Place 18 mcg into inhaler and inhale daily.    tiZANidine (ZANAFLEX) 4 MG tablet Take 4-8 mg by mouth See admin instructions. Take 8 mg every morning  Take 4 mg at bedtime    oxyCODONE (OXY IR/ROXICODONE) 5 MG immediate release tablet Take 15 mg by mouth every 4 (four) hours. For chronic post op pain      STOP taking these medications  lisinopril (PRINIVIL,ZESTRIL) 10 MG tablet      metoprolol tartrate (LOPRESSOR) 25 MG tablet          Discharge Condition: stable  Discharge Instructions Get Medicines reviewed and adjusted: Please take all your medications with you for your next visit with your Primary MD  Please request your Primary MD to go over all hospital tests and procedure/radiological results at the follow up, please ask your Primary MD to get all Hospital records sent to his/her office.  If you experience worsening of your admission symptoms, develop shortness of breath, life threatening emergency, suicidal or homicidal thoughts  you must seek medical attention immediately by calling 911 or calling your MD immediately if symptoms less severe.  You must read complete instructions/literature along with all the possible adverse reactions/side effects for all the Medicines you take and that have been prescribed to you. Take any new Medicines after you have completely understood and accpet all the possible adverse reactions/side effects.   Do not drive when taking Pain medications.   Do not take more than prescribed Pain, Sleep and Anxiety Medications  Special Instructions: If you have smoked or chewed Tobacco in the last 2 yrs please stop smoking, stop any regular Alcohol and or any Recreational drug use.  Wear Seat belts while driving.  Please note  You were cared for by a hospitalist during your hospital stay. Once you are discharged, your primary care physician will handle any further medical issues. Please note that NO REFILLS for any discharge medications will be authorized once you are discharged, as it is imperative that you return to your primary care physician (or establish a relationship with a primary care physician if you do not have one) for your aftercare needs so that they can reassess your need for medications and monitor your lab values.  Discharge Instructions    Diet - low sodium heart healthy    Complete by:  As directed    Increase activity slowly    Complete by:  As directed        Allergies  Allergen Reactions  . Aspirin Hives  . Ibuprofen Hives      Disposition: home with daughter   Consults: Critical care, cardiology    Significant Diagnostic Studies:  Ct Angio Chest Pe W Or Wo Contrast  Result Date: 03/10/2017 CLINICAL DATA:  [name]; shortness of breath and unresponsiveness. History of COPD and congestive heart failure. EXAM: CT ANGIOGRAPHY CHEST WITH CONTRAST TECHNIQUE: Multidetector CT imaging of the chest was performed using the standard protocol during bolus  administration of intravenous contrast. Multiplanar CT image reconstructions and MIPs were obtained to evaluate the vascular anatomy. CONTRAST:  70 cc Isovue 370 COMPARISON:  03/09/2017 chest radiograph FINDINGS: Cardiovascular: Mild cardiomegaly. Severe coronary artery calcification. Moderate calcific atherosclerosis of the thoracic aorta. Satisfactory opacification of the pulmonary arteries. No pulmonary embolus identified. Mediastinum/Nodes: No enlarged mediastinal, hilar, or axillary lymph nodes. Thyroid gland, trachea, and esophagus demonstrate no significant findings. Lungs/Pleura: Severe emphysema with lung apex predominance. Right upper lobe ground-glass opacity measures 12 mm (series 8, image 78). Left lower lobe solid 7 mm nodule (series 8, image 113). Bilateral lower lobe dependent consolidations, diffuse peribronchial thickening and scattered mucous plugging may represent pneumonia or aspiration. Small bilateral pleural effusions. Upper Abdomen: No acute abnormality. Musculoskeletal: No chest wall abnormality. No acute or significant osseous findings. Review of the MIP images confirms the above findings. IMPRESSION: 1. No pulmonary embolus identified. 2. Dependent consolidations with mucous plugging in  the lung bases may represent pneumonia or aspiration. Small bilateral pleural effusions. 3. Mild cardiomegaly and severe coronary artery calcification. 4. 12 mm right upper lobe ground-glass nodule and 7 mm left lower lobe solid nodule. Non-contrast chest CT at 3-6 months is recommended. If the nodules are stable at time of repeat CT, then future CT at 18-24 months (from today's scan) is considered optional for low-risk patients, but is recommended for high-risk patients. This recommendation follows the consensus statement: Guidelines for Management of Incidental Pulmonary Nodules Detected on CT Images: From the Fleischner Society 2017; Radiology 2017; 284:228-243. Electronically Signed   By: Kristine Garbe M.D.   On: 03/10/2017 03:43   Dg Chest Port 1 View  Result Date: 03/14/2017 CLINICAL DATA:  Patient with respiratory failure. EXAM: PORTABLE CHEST 1 VIEW COMPARISON:  Chest radiograph 03/12/2017. FINDINGS: Patient is rotated. Persistent right peritracheal opacity. Monitoring leads overlie the patient. Stable cardiac and mediastinal contours. Interval improvement in bilateral interstitial pulmonary opacities. No pleural effusion or pneumothorax. IMPRESSION: Improving interstitial opacities favored to represent improving edema. Persistent right peritracheal opacity. Recommend comparison with outside priors if available. If none are available, consider further evaluation the non acute setting with chest CT to exclude peritracheal mass. Cardiomegaly. Electronically Signed   By: Lovey Newcomer M.D.   On: 03/14/2017 09:54   Dg Chest Port 1 View  Result Date: 03/12/2017 CLINICAL DATA:  Respiratory failure EXAM: PORTABLE CHEST 1 VIEW COMPARISON:  03/12/2017 FINDINGS: Cardiac shadow is again enlarged. Vascular congestion remains with interstitial edema. The overall appearance given some technical variation in the film is slightly worsened particularly on the left. No bony abnormality is noted. IMPRESSION: Slight increase in degree of interstitial edema. Electronically Signed   By: Inez Catalina M.D.   On: 03/12/2017 15:08   Dg Chest Port 1 View  Result Date: 03/12/2017 CLINICAL DATA:  Respiratory failure. EXAM: PORTABLE CHEST 1 VIEW COMPARISON:  No prior. FINDINGS: Mild mediastinal prominence most likely prominent great vessels. Cardiomegaly with pulmonary venous congestion and interstitial prominence suggesting mild CHF. Bibasilar atelectasis and infiltrates/edema. Tiny bilateral pleural effusions cannot be excluded. No pneumothorax. IMPRESSION: 1. Congestive heart failure bilateral interstitial edema. Tiny bilateral pleural effusions cannot be excluded. 2.  Bibasilar atelectasis and  infiltrates/edema. Electronically Signed   By: Marcello Moores  Register   On: 03/12/2017 06:20   Dg Chest Portable 1 View  Result Date: 03/09/2017 CLINICAL DATA:  Endotracheal tube placement and orogastric tube placement. Initial encounter. EXAM: PORTABLE CHEST 1 VIEW COMPARISON:  None. FINDINGS: The patient's endotracheal tube is seen ending 6 cm above the carina. An enteric tube is noted extending below the diaphragm. Right midlung airspace opacity may reflect pneumonia. Underlying interstitial prominence and bilateral fibrotic change are noted. No definite pleural effusion or pneumothorax is seen. The cardiomediastinal silhouette is mildly enlarged. No acute osseous abnormalities are identified. IMPRESSION: 1. Endotracheal tube seen ending 6 cm above the carina. Enteric tube noted extending below the diaphragm. 2. Right midlung airspace opacity raises concern for pneumonia. 3. Underlying interstitial prominence and bilateral fibrosis noted. 4. Mild cardiomegaly. Electronically Signed   By: Garald Balding M.D.   On: 03/09/2017 23:08    echocardiogram    Mid LAD lesion, 50 %stenosed.  1st Diag lesion, 85 %stenosed.  Ost Cx to Prox Cx lesion, 100 %stenosed.  Ost RCA to Mid RCA lesion, 100 %stenosed.  LV end diastolic pressure is normal.  There is severe left ventricular systolic dysfunction.  The left ventricular ejection fraction is 25-35% by  visual estimate.   1. 3 vessel obstructive CAD    - 50% mid LAD, 85 % first diagonal    - 100% proximal LCx    -100% proximal RCA 2. Severe LV dysfunction 3. Normal LVEDP  Plan: study compared to prior cath films in February 2016 and there is no significant change. He has known ischemic cardiomyopathy. EDP is only 5 mm Hg so we will stop his IV lasix. He has done poorly with prior stents and has no active anginal symptoms. I suspect his elevated troponin is due to demand ischemia in setting of acute respiratory failure/ COPD. I would recommend aggressive  medical therapy. I would not recommend PCI of the diagonal unless he has significant angina.    Filed Weights   03/14/17 2019 03/15/17 2000 03/16/17 2120  Weight: 63.2 kg (139 lb 5.3 oz) 62.3 kg (137 lb 5.6 oz) 58 kg (127 lb 14.4 oz)     Microbiology: Recent Results (from the past 240 hour(s))  Blood culture (routine x 2)     Status: Abnormal   Collection Time: 03/09/17 11:15 PM  Result Value Ref Range Status   Specimen Description BLOOD LEFT ANTECUBITAL  Final   Special Requests   Final    BOTTLES DRAWN AEROBIC AND ANAEROBIC Blood Culture adequate volume   Culture  Setup Time   Final    GRAM POSITIVE COCCI IN CLUSTERS IN BOTH AEROBIC AND ANAEROBIC BOTTLES CRITICAL RESULT CALLED TO, READ BACK BY AND VERIFIED WITH: T RUDISILL PHARMD 1817 03/10/17 A BROWNING    Culture STAPHYLOCOCCUS HOMINIS (A)  Final   Report Status 03/13/2017 FINAL  Final   Organism ID, Bacteria STAPHYLOCOCCUS HOMINIS  Final      Susceptibility   Staphylococcus hominis - MIC*    CIPROFLOXACIN <=0.5 SENSITIVE Sensitive     ERYTHROMYCIN <=0.25 SENSITIVE Sensitive     GENTAMICIN <=0.5 SENSITIVE Sensitive     OXACILLIN <=0.25 SENSITIVE Sensitive     TETRACYCLINE <=1 SENSITIVE Sensitive     VANCOMYCIN 1 SENSITIVE Sensitive     TRIMETH/SULFA <=10 SENSITIVE Sensitive     CLINDAMYCIN <=0.25 SENSITIVE Sensitive     RIFAMPIN <=0.5 SENSITIVE Sensitive     Inducible Clindamycin NEGATIVE Sensitive     * STAPHYLOCOCCUS HOMINIS  Blood culture (routine x 2)     Status: Abnormal   Collection Time: 03/09/17 11:15 PM  Result Value Ref Range Status   Specimen Description BLOOD RIGHT ANTECUBITAL  Final   Special Requests   Final    BOTTLES DRAWN AEROBIC AND ANAEROBIC Blood Culture adequate volume   Culture  Setup Time   Final    GRAM POSITIVE COCCI IN CLUSTERS ANAEROBIC BOTTLE ONLY CRITICAL RESULT CALLED TO, READ BACK BY AND VERIFIED WITH: T RUDISILL PHARMD 1817 03/10/17 A BROWNING    Culture (A)  Final    STAPHYLOCOCCUS  HOMINIS SUSCEPTIBILITIES PERFORMED ON PREVIOUS CULTURE WITHIN THE LAST 5 DAYS.    Report Status 03/13/2017 FINAL  Final  Blood Culture ID Panel (Reflexed)     Status: Abnormal   Collection Time: 03/09/17 11:15 PM  Result Value Ref Range Status   Enterococcus species NOT DETECTED NOT DETECTED Final   Listeria monocytogenes NOT DETECTED NOT DETECTED Final   Staphylococcus species DETECTED (A) NOT DETECTED Final    Comment: Methicillin (oxacillin) susceptible coagulase negative staphylococcus. Possible blood culture contaminant (unless isolated from more than one blood culture draw or clinical case suggests pathogenicity). No antibiotic treatment is indicated for blood  culture contaminants. CRITICAL  RESULT CALLED TO, READ BACK BY AND VERIFIED WITH: T RUDISILL PHARMD 1817 03/10/17 A BROWNING    Staphylococcus aureus NOT DETECTED NOT DETECTED Final   Methicillin resistance NOT DETECTED NOT DETECTED Final   Streptococcus species NOT DETECTED NOT DETECTED Final   Streptococcus agalactiae NOT DETECTED NOT DETECTED Final   Streptococcus pneumoniae NOT DETECTED NOT DETECTED Final   Streptococcus pyogenes NOT DETECTED NOT DETECTED Final   Acinetobacter baumannii NOT DETECTED NOT DETECTED Final   Enterobacteriaceae species NOT DETECTED NOT DETECTED Final   Enterobacter cloacae complex NOT DETECTED NOT DETECTED Final   Escherichia coli NOT DETECTED NOT DETECTED Final   Klebsiella oxytoca NOT DETECTED NOT DETECTED Final   Klebsiella pneumoniae NOT DETECTED NOT DETECTED Final   Proteus species NOT DETECTED NOT DETECTED Final   Serratia marcescens NOT DETECTED NOT DETECTED Final   Haemophilus influenzae NOT DETECTED NOT DETECTED Final   Neisseria meningitidis NOT DETECTED NOT DETECTED Final   Pseudomonas aeruginosa NOT DETECTED NOT DETECTED Final   Candida albicans NOT DETECTED NOT DETECTED Final   Candida glabrata NOT DETECTED NOT DETECTED Final   Candida krusei NOT DETECTED NOT DETECTED Final    Candida parapsilosis NOT DETECTED NOT DETECTED Final   Candida tropicalis NOT DETECTED NOT DETECTED Final  MRSA PCR Screening     Status: None   Collection Time: 03/10/17  5:33 AM  Result Value Ref Range Status   MRSA by PCR NEGATIVE NEGATIVE Final    Comment:        The GeneXpert MRSA Assay (FDA approved for NASAL specimens only), is one component of a comprehensive MRSA colonization surveillance program. It is not intended to diagnose MRSA infection nor to guide or monitor treatment for MRSA infections.   Culture, blood (routine x 2)     Status: None   Collection Time: 03/12/17 11:40 AM  Result Value Ref Range Status   Specimen Description BLOOD RIGHT ANTECUBITAL  Final   Special Requests   Final    BOTTLES DRAWN AEROBIC AND ANAEROBIC Blood Culture adequate volume   Culture NO GROWTH 5 DAYS  Final   Report Status 03/17/2017 FINAL  Final  Culture, blood (routine x 2)     Status: Abnormal   Collection Time: 03/12/17 11:49 AM  Result Value Ref Range Status   Specimen Description BLOOD LEFT ANTECUBITAL  Final   Special Requests IN PEDIATRIC BOTTLE Blood Culture adequate volume  Final   Culture  Setup Time   Final    GRAM POSITIVE COCCI IN CLUSTERS IN PEDIATRIC BOTTLE CRITICAL VALUE NOTED.  VALUE IS CONSISTENT WITH PREVIOUSLY REPORTED AND CALLED VALUE.    Culture (A)  Final    STAPHYLOCOCCUS HOMINIS SUSCEPTIBILITIES PERFORMED ON PREVIOUS CULTURE WITHIN THE LAST 5 DAYS.    Report Status 03/15/2017 FINAL  Final       Blood Culture    Component Value Date/Time   SDES BLOOD LEFT ANTECUBITAL 03/12/2017 1149   SPECREQUEST IN PEDIATRIC BOTTLE Blood Culture adequate volume 03/12/2017 1149   CULT (A) 03/12/2017 1149    STAPHYLOCOCCUS HOMINIS SUSCEPTIBILITIES PERFORMED ON PREVIOUS CULTURE WITHIN THE LAST 5 DAYS.    REPTSTATUS 03/15/2017 FINAL 03/12/2017 1149      Labs: Results for orders placed or performed during the hospital encounter of 03/09/17 (from the past 48  hour(s))  Glucose, capillary     Status: Abnormal   Collection Time: 03/15/17 12:26 PM  Result Value Ref Range   Glucose-Capillary 122 (H) 65 - 99 mg/dL  Glucose, capillary  Status: Abnormal   Collection Time: 03/15/17  6:03 PM  Result Value Ref Range   Glucose-Capillary 189 (H) 65 - 99 mg/dL  Glucose, capillary     Status: Abnormal   Collection Time: 03/15/17  8:04 PM  Result Value Ref Range   Glucose-Capillary 158 (H) 65 - 99 mg/dL  Glucose, capillary     Status: Abnormal   Collection Time: 03/16/17 12:01 AM  Result Value Ref Range   Glucose-Capillary 190 (H) 65 - 99 mg/dL  Protime-INR     Status: None   Collection Time: 03/16/17  2:36 AM  Result Value Ref Range   Prothrombin Time 15.1 11.4 - 15.2 seconds   INR 1.18   CBC     Status: Abnormal   Collection Time: 03/16/17  2:36 AM  Result Value Ref Range   WBC 12.7 (H) 4.0 - 10.5 K/uL   RBC 4.21 (L) 4.22 - 5.81 MIL/uL   Hemoglobin 10.3 (L) 13.0 - 17.0 g/dL   HCT 34.1 (L) 39.0 - 52.0 %   MCV 81.0 78.0 - 100.0 fL   MCH 24.5 (L) 26.0 - 34.0 pg   MCHC 30.2 30.0 - 36.0 g/dL   RDW 16.5 (H) 11.5 - 15.5 %   Platelets 288 150 - 400 K/uL  Heparin level (unfractionated)     Status: None   Collection Time: 03/16/17  2:36 AM  Result Value Ref Range   Heparin Unfractionated 0.53 0.30 - 0.70 IU/mL    Comment:        IF HEPARIN RESULTS ARE BELOW EXPECTED VALUES, AND PATIENT DOSAGE HAS BEEN CONFIRMED, SUGGEST FOLLOW UP TESTING OF ANTITHROMBIN III LEVELS.   Glucose, capillary     Status: Abnormal   Collection Time: 03/16/17  4:01 AM  Result Value Ref Range   Glucose-Capillary 163 (H) 65 - 99 mg/dL  Glucose, capillary     Status: Abnormal   Collection Time: 03/16/17  7:57 AM  Result Value Ref Range   Glucose-Capillary 131 (H) 65 - 99 mg/dL  Glucose, capillary     Status: Abnormal   Collection Time: 03/16/17 12:13 PM  Result Value Ref Range   Glucose-Capillary 152 (H) 65 - 99 mg/dL  Glucose, capillary     Status: Abnormal    Collection Time: 03/16/17  6:30 PM  Result Value Ref Range   Glucose-Capillary 208 (H) 65 - 99 mg/dL  Glucose, capillary     Status: Abnormal   Collection Time: 03/16/17  9:16 PM  Result Value Ref Range   Glucose-Capillary 176 (H) 65 - 99 mg/dL  Glucose, capillary     Status: Abnormal   Collection Time: 03/17/17  4:12 AM  Result Value Ref Range   Glucose-Capillary 140 (H) 65 - 99 mg/dL  Protime-INR     Status: None   Collection Time: 03/17/17  5:25 AM  Result Value Ref Range   Prothrombin Time 14.3 11.4 - 15.2 seconds   INR 1.10   CBC     Status: Abnormal   Collection Time: 03/17/17  5:25 AM  Result Value Ref Range   WBC 11.2 (H) 4.0 - 10.5 K/uL   RBC 4.08 (L) 4.22 - 5.81 MIL/uL   Hemoglobin 10.0 (L) 13.0 - 17.0 g/dL   HCT 33.2 (L) 39.0 - 52.0 %   MCV 81.4 78.0 - 100.0 fL   MCH 24.5 (L) 26.0 - 34.0 pg   MCHC 30.1 30.0 - 36.0 g/dL   RDW 16.5 (H) 11.5 - 15.5 %   Platelets 243  150 - 400 K/uL  Basic metabolic panel     Status: Abnormal   Collection Time: 03/17/17  5:25 AM  Result Value Ref Range   Sodium 135 135 - 145 mmol/L   Potassium 3.8 3.5 - 5.1 mmol/L   Chloride 99 (L) 101 - 111 mmol/L   CO2 28 22 - 32 mmol/L   Glucose, Bld 140 (H) 65 - 99 mg/dL   BUN 38 (H) 6 - 20 mg/dL   Creatinine, Ser 1.38 0.61 - 1.24 mg/dL   Calcium 8.6 (L) 8.9 - 10.3 mg/dL   GFR calc non Af Amer >60 >60 mL/min   GFR calc Af Amer >60 >60 mL/min    Comment: (NOTE) The eGFR has been calculated using the CKD EPI equation. This calculation has not been validated in all clinical situations. eGFR's persistently <60 mL/min signify possible Chronic Kidney Disease.    Anion gap 8 5 - 15  Glucose, capillary     Status: Abnormal   Collection Time: 03/17/17  7:39 AM  Result Value Ref Range   Glucose-Capillary 125 (H) 65 - 99 mg/dL     Lipid Panel     Component Value Date/Time   TRIG 44 03/11/2017 0439     Lab Results  Component Value Date   HGBA1C 6.2 (H) 03/10/2017      HPI :  71 y/o  M admitted 7/2 with complaints of shortness of breath. He was unresponsive on arrival to the ER and was intubated. The patient was noted to have vomited with concern for possible aspiration. CXR demonstrated RML opacity. EKG with incomplete LBBB. He was started on empiric abx and admitted to ICU. 7/4 noted to have elevated troponin 7.86 >8.21. Seen by cardiology, non-ST elevation MI/demand ischemia. Transfer to [facility] 7/6  HOSPITAL COURSE: *   Acute Hypoxic Respiratory Failure /COPD exacerbation- in setting of RML opacity / PNA. CAP vs Aspiration. CTA negative for PE Patient also has a history of bronchiectasis, tobacco use Extubated 7/4, currently on  92% on room air, completed steroids for COPD exacerbation Repeat CXR, appears to have improved Continue supplemental o2 w/ [facility] for sats >90% Duoneb Q4  Continue Brovana + Pulmicort       Elevated Troponin / NSTEMI- cardiology felt elevations in troponin related to known occulusions in the setting of demand ischemia. Cardiac cath results as above. Repeat echo showing EF 40-45% and akinesis of the lateral wall and hypokinesis inferiorly with a hyperdynamic anterior wall and septum.  Medical management recommended, Continue aspirin, statin, beta blocker, Imdur and ARB Cardiac cath on 03/16/17-results as above   Paroxsysmal afib, stable since admission: taking amiodarone and coumadin. INR subtherapeutic to 1.14. [name] CHADSvasc 4 Cardiology following; appreciate recommendation Cardiology recommends resuming Coumadin after cardiac cath, Lovenox initiated post cath until INR is therapeutic, next INR check will be 7/12       Anemia of chronic disease - 9.6 > 8.0>8.1>8.7>10.3 Transfuse for hemoglobin less than 8.0      RML opacity -? Aspiration PNA Hypothermia - initial rectal temp 95.7, Initially placed on Continue ceftriaxone until 7/8 BCID shows staph hominis- ID consulted, Dr. Orvan Falconer recommended ceftriaxone changed over to cefazolin,  to oral Keflex 500 mg 4 times a day until 7/15    Hyperglycemia, likely steroid-induced Hemoglobin A1c 6.2    Acute encephalopathy -improving  Probable ETOH use - daughter states he stopped drinking ETOH years ago Continue Folate, MVI, Thiamine QD Did not have any active signs of withdrawal in the hospital  Discharge Exam  Blood pressure 131/72, pulse 70, temperature 98 F (36.7 C), temperature source Oral, resp. rate 18, height _0  (1.651 m), weight 58 kg (127 lb 14.4 oz), SpO2 96 %.    Cardiac: RRR; 2/6 murmur at the apex, no rubs, or gallops,no edema. R radial cath site w/out ecchymosis or hematoma, good radial pulse Respiratory: decreased BS bases bilaterally, normal work of breathing, slight wheeze GI: soft, nontender, nondistended, + BS MS: no deformity or atrophy  Skin: warm and dry Neuro:  Strength and sensation are intact    Signed: Reyne Dumas 03/17/2017, 11:55 AM        Time spent >1 hour

## 2017-03-17 NOTE — Evaluation (Signed)
Clinical/Bedside Swallow Evaluation Patient Details  Name: [name] MRN: [id number] Date of Birth: [birth year]  Today's Date: 03/17/2017 Time: SLP Start Time (ACUTE ONLY): 8 SLP Stop Time (ACUTE ONLY): 1505 SLP Time Calculation (min) (ACUTE ONLY): 39 min  Past Medical History:  Past Medical History:  Diagnosis Date  . AAA (abdominal aortic aneurysm) without rupture ([place])   . Acute respiratory failure ([place]) 03/13/2017  . CHF (congestive heart failure) ([place])   . COPD (chronic obstructive pulmonary disease) ([place])   . Coronary artery disease   . Hypertension   . Renal artery dissection [facility])    Past Surgical History:  Past Surgical History:  Procedure Laterality Date  . LEFT HEART CATH AND CORONARY ANGIOGRAPHY N/A 03/16/2017   Procedure: Left Heart Cath and Coronary Angiography;  Surgeon: Martinique, Peter M, MD;  Location: [facility];  Service: Cardiovascular;  Laterality: N/A;  . NECK SURGERY     HPI:  [name] admitted 7/2 with complaints of shortness of breath. He was unresponsive on arrival to the ER and was intubated. The patient was noted to have vomited with concern for possible aspiration. CXR demonstrated RML opacity.  Dx acute hypoxic resp failure/COPD exacerbation; NSTEMI. ETT 7/2-7/4.    Assessment / Plan / Recommendation Clinical Impression  Pt presents with concerns for dysphagia with aspiration - phonation is hoarse, worsening after extubation per his daughter.  Pt failed 3 oz water test, which elicited immediate, explosive coughing - this screen is sensitive for aspiration.  Unfortunately, pt declined to proceed with further testing while here.  He declined to be evaluated with thicker liquids.  He did agree to return as an OP for a FEES exam, which has been scheduled for next Wed 7/18.  He appears to have a dysphagia which may be related to his intubation- however, until he undergoes an instrumental study, it is not known which consistencies will be safest or  how best to treat dysphagia.  As stated, pt agrees to return for OP study; he and his daughter verbalize understanding that he is likely aspirating thin liquids and acknowledge its detrimental effects.  He is not willing to be clinically assessed with other consistencies, and is ready to leave the hospital.   SLP Visit Diagnosis: Dysphagia, unspecified (R13.10)    Aspiration Risk  Moderate aspiration risk    Diet Recommendation          Other  Recommendations Oral Care Recommendations: Oral care BID   Follow up Recommendations        Frequency and Duration            Prognosis        Swallow Study   General Date of Onset: 03/09/17 HPI: [name] admitted 7/2 with complaints of shortness of breath. He was unresponsive on arrival to the ER and was intubated. The patient was noted to have vomited with concern for possible aspiration. CXR demonstrated RML opacity.  Dx acute hypoxic resp failure/COPD exacerbation; NSTEMI. ETT 7/2-7/4.  Type of Study: Bedside Swallow Evaluation Previous Swallow Assessment: no Diet Prior to this Study: Regular;Thin liquids Temperature Spikes Noted: No Respiratory Status: Room air History of Recent Intubation: Yes Length of Intubations (days): 2 days Date extubated: 03/11/17 Behavior/Cognition: Alert;Cooperative Oral Cavity Assessment: Within Functional Limits Oral Care Completed by SLP: No Oral Cavity - Dentition: Edentulous Vision: Functional for self-feeding Self-Feeding Abilities: Able to feed self Patient Positioning: Upright in chair Baseline Vocal Quality: Hoarse Volitional Cough: Weak Volitional Swallow: Able to  elicit    Oral/Motor/Sensory Function Overall Oral Motor/Sensory Function: Within functional limits   Ice Chips Ice chips: Within functional limits   Thin Liquid Thin Liquid: Impaired Presentation: Cup Pharyngeal  Phase Impairments: Cough - Immediate    Nectar Thick Nectar Thick Liquid: Not tested   Honey Thick Honey  Thick Liquid: Not tested   Puree Puree: Within functional limits   Solid   GO   Solid: Not tested        [name] 03/17/2017,3:52 PM

## 2017-03-17 NOTE — Care Management Note (Signed)
Case Management Note  Patient Details  Name: [name] MRN: [id number] Date of Birth: 05/04/1946  Subjective/Objective:             CM following for progression and d/c planning.        Action/Plan: 03/17/2017 Pt for d/c to home today with family , daughter providing transportation. Pt scheduled for hospital followup and INR blood work to be drawn on Thursday, March 19, 2017 at 2pm with Dr Hox , [facility] in [place], [place]. Phone number [phone]. Info place in d/c instructions.   Expected Discharge Date:  03/17/17               Expected Discharge Plan:  Home/Self Care  In-House Referral:  NA  Discharge planning Services  Other - See comment (Followup appointment. )  Post Acute Care Choice:  NA Choice offered to:  Patient  DME Arranged:  N/A DME Agency:  NA  HH Arranged:  NA HH Agency:  NA  Status of Service:  Completed, signed off  If discussed at [place] of Stay Meetings, dates discussed:    Additional Comments:  Adron Bene, RN 03/17/2017, 1:09 PM

## 2017-03-17 NOTE — Telephone Encounter (Signed)
Patient scheduled for TCM appt with Ellen Henri on 04-06-17 @3 :30pm.

## 2017-03-17 NOTE — Progress Notes (Signed)
[place] for heparin Indication: atrial fibrillation/ACS  Allergies  Allergen Reactions  . Aspirin Hives  . Ibuprofen Hives    Patient Measurements: Height: 5\' 5"  (165.1 cm) Weight: 127 lb 14.4 oz (58 kg) IBW/kg (Calculated) : 61.5 Heparin Dosing Weight: 63 kg  Vital Signs: Temp: 98 F (36.7 C) (07/10 1000) Temp Source: Oral (07/10 1000) BP: 131/72 (07/10 1000) Pulse Rate: 70 (07/10 1030)  Labs:  Recent Labs  03/14/17 1141  03/15/17 0251 03/16/17 0236 03/17/17 0525  HGB  --   < > 10.2* 10.3* 10.0*  HCT  --   --  33.4* 34.1* 33.2*  PLT  --   --  270 288 243  LABPROT  --   --  15.2 15.1 14.3  INR  --   --  1.20 1.18 1.10  HEPARINUNFRC 0.40  --  0.48 0.53  --   CREATININE  --   --   --   --  0.81  < > = values in this interval not displayed.  Estimated Creatinine Clearance: 70.6 mL/min (by C-G formula based on SCr of 0.81 mg/dL).   Assessment: 49 YOM w/ pAFib (CHADsVASc = 4) and NSTEMI, s/p cath, no significant change from previous cath. Plan for medical therapy.  D/c heparin, and restart coumadin (first dose missed on 7/9)  Coumadin PTA dose: 2.5 mg daily. INR 1.14 on admission, INR 1.10 this morning. On amiodarone home dose.    Goal of Therapy:  INR 2-3 Monitor platelets by anticoagulation protocol: Yes   Plan:  - Coumadin 4 mg po x 1 - Daily PT/INR  [name], PharmD Pharmacy Resident Pager #: [phone] 03/17/2017 10:39 AM

## 2017-03-17 NOTE — Evaluation (Signed)
Physical Therapy Evaluation & Discharge Patient Details Name: [name] MRN: [id number] DOB: 1946/03/10 Today's Date: 03/17/2017   History of Present Illness  [name] admitted 7/2 with complaints of shortness of breath.  Was intubated 7/3-7/4.  The patient was noted to have vomited with concern for possible aspiration. CXR demonstrated RML opacity.  Found to have elevated troponin with NSTEMI and now s/p cath,  Clinical Impression  Patient presents with generalized weakness, but not high fall risk with Berg score 51/56.  Did educate in energy conservation and use of DME initially to avoid excessive fatigue.  Also with some desaturation ambulating on room air, but reports already has home O2.  Will likely return to baseline at home with family support.  No further skilled PT recommended at this time.     Follow Up Recommendations No PT follow up    Equipment Recommendations  None recommended by PT    Recommendations for Other Services       Precautions / Restrictions Precautions Precautions: Fall Precaution Comments: watch O2      Mobility  Bed Mobility               General bed mobility comments: sitting EOB  Transfers Overall transfer level: Modified independent                  Ambulation/Gait Ambulation/Gait assistance: Supervision Ambulation Distance (Feet): 220 Feet Assistive device: None Gait Pattern/deviations: Step-through pattern;Decreased stride length;Drifts right/left     General Gait Details: some evidence for imbalance, but no overt LOB, S for safety  Stairs            Wheelchair Mobility    Modified Rankin (Stroke Patients Only)       Balance Overall balance assessment: Needs assistance   Sitting balance-Leahy Scale: Good       Standing balance-Leahy Scale: Good                   Standardized Balance Assessment Standardized Balance Assessment : Berg Balance Test Berg Balance Test Sit to Stand: Able to  stand  independently using hands Standing Unsupported: Able to stand safely 2 minutes Sitting with Back Unsupported but Feet Supported on Floor or Stool: Able to sit safely and securely 2 minutes Stand to Sit: Sits safely with minimal use of hands Transfers: Able to transfer safely, minor use of hands Standing Unsupported with Eyes Closed: Able to stand 10 seconds safely Standing Ubsupported with Feet Together: Able to place feet together independently and stand 1 minute safely From Standing, Reach Forward with Outstretched Arm: Can reach confidently >25 cm (10") From Standing Position, Pick up Object from Floor: Able to pick up shoe safely and easily From Standing Position, Turn to Look Behind Over each Shoulder: Looks behind from both sides and weight shifts well Turn 360 Degrees: Able to turn 360 degrees safely in 4 seconds or less Standing Unsupported, Alternately Place Feet on Step/Stool: Able to stand independently and complete 8 steps >20 seconds Standing Unsupported, One Foot in Front: Able to place foot tandem independently and hold 30 seconds Standing on One Leg: Tries to lift leg/unable to hold 3 seconds but remains standing independently Total Score: 51         Pertinent Vitals/Pain Pain Assessment: No/denies pain    Home Living Family/patient expects to be discharged to:: Private residence Living Arrangements: Children;Other relatives Available Help at Discharge: Family Type of Home: Mobile home Home Access: Stairs to enter   Entrance Stairs-Number  of Steps: 1 Home Layout: One level Home Equipment: [place] - 2 wheels;Shower seat;Bedside commode      Prior Function Level of Independence: Independent               Hand Dominance        Extremity/Trunk Assessment        Lower Extremity Assessment Lower Extremity Assessment: Overall WFL for tasks assessed    Cervical / Trunk Assessment Cervical / Trunk Assessment: Kyphotic  Communication    Communication: Other (comment) (hoarse voice)  Cognition Arousal/Alertness: Awake/alert Behavior During Therapy: WFL for tasks assessed/performed Overall Cognitive Status: Within Functional Limits for tasks assessed                                        General Comments General comments (skin integrity, edema, etc.): Reports has home O2, educated likely needs to use at night, SpO2 w/ ambulation 85%, back to 90% with seated rest x 1 minute, as well as to space out activities for energy conservation, sit when able and take walker initially if walking long distance.     Exercises     Assessment/Plan    PT Assessment Patent does not need any further PT services  PT Problem List         PT Treatment Interventions      PT Goals (Current goals can be found in the Care Plan section)  Acute Rehab PT Goals PT Goal Formulation: All assessment and education complete, DC therapy    Frequency     Barriers to discharge        Co-evaluation               AM-PAC PT "6 Clicks" Daily Activity  Outcome Measure Difficulty turning over in bed (including adjusting bedclothes, sheets and blankets)?: None Difficulty moving from lying on back to sitting on the side of the bed? : A Little Difficulty sitting down on and standing up from a chair with arms (e.g., wheelchair, bedside commode, etc,.)?: A Little Help needed moving to and from a bed to chair (including a wheelchair)?: None Help needed walking in hospital room?: A Little Help needed climbing 3-5 steps with a railing? : A Little 6 Click Score: 20    End of Session   Activity Tolerance: Patient tolerated treatment well Patient left: in bed;with call bell/phone within reach;with family/visitor present        Time: 1330-1353 PT Time Calculation (min) (ACUTE ONLY): 23 min   Charges:   PT Evaluation $PT Eval Moderate Complexity: 1 Procedure PT Treatments $Self Care/Home Management: 8-22   PT G CodesMagda Kiel, [place] 03/17/2017   Reginia Naas 03/17/2017, 2:04 PM

## 2017-03-17 NOTE — Discharge Instructions (Signed)

## 2017-03-17 NOTE — Progress Notes (Signed)
Dc instructions reviewed with patient and daughter Katrina. Both verbalized understanding. Daughter demonstrated on how to give sub q lovenox injection.

## 2017-03-17 NOTE — Progress Notes (Signed)
Progress Note  Patient Name: [name] Date of Encounter: 03/17/2017  Primary Cardiologist: Dr [name]  Subjective   Denies SOB overnight. Was told results of cath, understands we will try med rx for now.  He is hoarse since extubated, would like that assessed before d/c. Not sure he can quit smoking.  Inpatient Medications    Scheduled Meds: . amiodarone  200 mg Oral Daily  . aspirin  81 mg Oral Daily  . atorvastatin  80 mg Oral q1800  . chlorhexidine gluconate (MEDLINE KIT)  15 mL Mouth Rinse BID  . enoxaparin (LOVENOX) injection  40 mg Subcutaneous Q24H  . fluticasone furoate-vilanterol  1 puff Inhalation Daily  . folic acid  1 mg Per Tube Daily  . insulin aspart  0-9 Units Subcutaneous Q4H  . losartan  25 mg Oral Daily  . mouth rinse  15 mL Mouth Rinse BID  . methylPREDNISolone (SOLU-MEDROL) injection  30 mg Intravenous Q12H  . metoprolol succinate  12.5 mg Oral Daily  . multivitamin with minerals  1 tablet Per Tube Daily  . pantoprazole  40 mg Oral Daily  . sodium chloride flush  3 mL Intravenous Q12H  . thiamine  100 mg Oral Daily  . tiotropium  18 mcg Inhalation Daily  . Warfarin - Pharmacist Dosing Inpatient   Does not apply q1800   Continuous Infusions: . sodium chloride 250 mL (03/13/17 1512)  . sodium chloride    .  ceFAZolin (ANCEF) IV 1 g (03/17/17 0557)   PRN Meds: sodium chloride, sodium chloride, docusate, hydrALAZINE, oxyCODONE, sodium chloride flush   Vital Signs    Vitals:   03/16/17 2120 03/17/17 0419 03/17/17 0850 03/17/17 1000  BP: (!) 118/55 118/68  131/72  Pulse: (!) 56 (!) 55  (!) 51  Resp: '16 16  18  '$ Temp: 97.7 F (36.5 C) 97.9 F (36.6 C)  98 F (36.7 C)  TempSrc: Oral Oral  Oral  SpO2: 95% 94% 92% 96%  Weight: 127 lb 14.4 oz (58 kg)     Height:        Intake/Output Summary (Last 24 hours) at 03/17/17 1022 Last data filed at 03/17/17 0900  Gross per 24 hour  Intake              940 ml  Output                0 ml  Net               940 ml   Filed Weights   03/14/17 2019 03/15/17 2000 03/16/17 2120  Weight: 139 lb 5.3 oz (63.2 kg) 137 lb 5.6 oz (62.3 kg) 127 lb 14.4 oz (58 kg)    Telemetry    Sinus rhythm, sinus brady last 48 hr - HR low 50s at times, no sx reported Personally Reviewed   Physical Exam   GEN: Well nourished, well developed, in no acute distress  HEENT: normal  Neck: no JVD, no carotid bruits, or masses Cardiac: RRR; 2/6 murmur at the apex, no rubs, or gallops,no edema. R radial cath site w/out ecchymosis or hematoma, good radial pulse Respiratory: decreased BS bases bilaterally, normal work of breathing, slight wheeze GI: soft, nontender, nondistended, + BS MS: no deformity or atrophy  Skin: warm and dry Neuro:  Strength and sensation are intact Psych: euthymic mood, full affect    Labs    Chemistry  Recent Labs Lab 03/12/17 0356 03/14/17 0414 03/17/17 0525  NA 138 136  135  K 4.5 3.7 3.8  CL 103 102 99*  CO2 '28 27 28  '$ GLUCOSE 138* 129* 140*  BUN 27* 40* 38*  CREATININE 0.65 0.78 0.81  CALCIUM 8.4* 8.6* 8.6*  PROT  --  6.1*  --   ALBUMIN  --  2.7*  --   AST  --  37  --   ALT  --  47  --   ALKPHOS  --  62  --   BILITOT  --  1.2  --   GFRNONAA >60 >60 >60  GFRAA >60 >60 >60  ANIONGAP '7 7 8     '$ Hematology  Recent Labs Lab 03/15/17 0251 03/16/17 0236 03/17/17 0525  WBC 7.3 12.7* 11.2*  RBC 4.17* 4.21* 4.08*  HGB 10.2* 10.3* 10.0*  HCT 33.4* 34.1* 33.2*  MCV 80.1 81.0 81.4  MCH 24.5* 24.5* 24.5*  MCHC 30.5 30.2 30.1  RDW 16.4* 16.5* 16.5*  PLT 270 288 243    Cardiac Enzymes  Recent Labs Lab 03/11/17 0439 03/11/17 1032 03/11/17 1650 03/14/17 0949  TROPONINI 7.86* 8.21* 6.23* 5.55*    BNP  Recent Labs Lab 03/14/17 0949 03/15/17 0251  BNP 1,130.7* 793.5*      Cardiac Cath 03/16/2017     Mid LAD lesion, 50 %stenosed.  1st Diag lesion, 85 %stenosed.  Ost Cx to Prox Cx lesion, 100 %stenosed.  Ost RCA to Mid RCA lesion, 100  %stenosed.  LV end diastolic pressure is normal.  There is severe left ventricular systolic dysfunction.  The left ventricular ejection fraction is 25-35% by visual estimate.   1. 3 vessel obstructive CAD    - 50% mid LAD, 85 % first diagonal    - 100% proximal LCx    -100% proximal RCA 2. Severe LV dysfunction 3. Normal LVEDP  Plan: study compared to prior cath films in February 2016 and there is no significant change. He has known ischemic cardiomyopathy. EDP is only 5 mm Hg so we will stop his IV lasix. He has done poorly with prior stents and has no active anginal symptoms. I suspect his elevated troponin is due to demand ischemia in setting of acute respiratory failure/ COPD. I would recommend aggressive medical therapy. I would not recommend PCI of the diagonal unless he has significant angina.  Patient Profile     71 y.o. male with past medical history of CAD, COPD, PAF, ICM admitted 07/02 with respiratory failure felt possibly secondary to COPD with superimposed pneumonia or aspiration. ETT 07/02-07/04.Troponin elevated and cardiology asked to evaluate. Echo w/ EF 40-45%, moderate mitral regurgitation and biatrial enlargement. Note ejection fraction essentially unchanged compared to previous.  Assessment & Plan    1 non-ST elevation myocardial infarction- severe native CAD, no recent change. MI is likely from increased demand with COPD exacerbation due to pneumonia/aspiration. He is on ASA, high-dose statin, ARB, low-dose BB. No increase in BB due to HR 50s at times. Will add low-dose Imdur, increase to 30 mg qd or more if tolerates.  2.  history of paroxysmal atrial fibrillation- maintaining SR. Continue amiodarone. Restart coumadin after procedures  3 ischemic cardiomyopathy- on losartan/metoprolol, IV Lasix d/c'd due to LVEDP 5. Was not on diuretic PTA, needs early f/u and daily weights w/ low Na diet as outpatient.   4 normocytic anemia-further workup per primary  service.  5 COPD/pneumonia-per primary team.  6. Hoarseness post-ETT: Per IM, may need swallow eval.  No further inpatient cardiac workup. Will send message for outpt f/u appt.  Signed, Rosaria Ferries, PA-C  03/17/2017, 10:22 AM   As above; pt seen and examined; no chest pain or dyspnea; radial cath site with no hematoma; cath results noted; plan medical therapy; continue ASA, statin; continue ARB and toprol for ischemic cardiomyopathy; diuretic DCed as LVEDP low; follow volume status closely as outpt; pt remains in sinus; continue amiodarone and coumadin has been resumed. Note abnormality noted on chest xray; will need fu with his primary care as outpt CT recommended to R/O right peritracheal mass.  TOC appt one week; fu with Dr [name] 12 weeks. Kirk Ruths, MD

## 2017-03-18 ENCOUNTER — Encounter: Payer: Self-pay | Admitting: Internal Medicine

## 2017-03-19 ENCOUNTER — Encounter (HOSPITAL_BASED_OUTPATIENT_CLINIC_OR_DEPARTMENT_OTHER): Payer: Self-pay | Admitting: *Deleted

## 2017-03-19 ENCOUNTER — Observation Stay (HOSPITAL_BASED_OUTPATIENT_CLINIC_OR_DEPARTMENT_OTHER)
Admission: EM | Admit: 2017-03-19 | Discharge: 2017-03-20 | Disposition: A | Discharge status: Home / Self Care | Payer: Medicare Other | Attending: Internal Medicine | Admitting: Internal Medicine

## 2017-03-19 DIAGNOSIS — I252 Old myocardial infarction: Secondary | ICD-10-CM | POA: Insufficient documentation

## 2017-03-19 DIAGNOSIS — I471 Supraventricular tachycardia: Secondary | ICD-10-CM | POA: Diagnosis not present

## 2017-03-19 DIAGNOSIS — D638 Anemia in other chronic diseases classified elsewhere: Secondary | ICD-10-CM | POA: Diagnosis not present

## 2017-03-19 DIAGNOSIS — R001 Bradycardia, unspecified: Secondary | ICD-10-CM

## 2017-03-19 DIAGNOSIS — E876 Hypokalemia: Secondary | ICD-10-CM

## 2017-03-19 DIAGNOSIS — Z8673 Personal history of transient ischemic attack (TIA), and cerebral infarction without residual deficits: Secondary | ICD-10-CM | POA: Insufficient documentation

## 2017-03-19 DIAGNOSIS — F419 Anxiety disorder, unspecified: Secondary | ICD-10-CM | POA: Diagnosis not present

## 2017-03-19 DIAGNOSIS — G8928 Other chronic postprocedural pain: Secondary | ICD-10-CM | POA: Diagnosis not present

## 2017-03-19 DIAGNOSIS — I714 Abdominal aortic aneurysm, without rupture: Secondary | ICD-10-CM | POA: Insufficient documentation

## 2017-03-19 DIAGNOSIS — I480 Paroxysmal atrial fibrillation: Secondary | ICD-10-CM | POA: Insufficient documentation

## 2017-03-19 DIAGNOSIS — J449 Chronic obstructive pulmonary disease, unspecified: Secondary | ICD-10-CM | POA: Insufficient documentation

## 2017-03-19 DIAGNOSIS — I739 Peripheral vascular disease, unspecified: Secondary | ICD-10-CM | POA: Diagnosis not present

## 2017-03-19 DIAGNOSIS — J9601 Acute respiratory failure with hypoxia: Secondary | ICD-10-CM | POA: Diagnosis not present

## 2017-03-19 DIAGNOSIS — D509 Iron deficiency anemia, unspecified: Secondary | ICD-10-CM

## 2017-03-19 DIAGNOSIS — J441 Chronic obstructive pulmonary disease with (acute) exacerbation: Principal | ICD-10-CM | POA: Insufficient documentation

## 2017-03-19 DIAGNOSIS — I2510 Atherosclerotic heart disease of native coronary artery without angina pectoris: Secondary | ICD-10-CM | POA: Insufficient documentation

## 2017-03-19 DIAGNOSIS — E7800 Pure hypercholesterolemia, unspecified: Secondary | ICD-10-CM | POA: Diagnosis not present

## 2017-03-19 DIAGNOSIS — I700 Atherosclerosis of aorta: Secondary | ICD-10-CM | POA: Diagnosis not present

## 2017-03-19 DIAGNOSIS — I9589 Other hypotension: Secondary | ICD-10-CM

## 2017-03-19 DIAGNOSIS — Z7901 Long term (current) use of anticoagulants: Secondary | ICD-10-CM | POA: Diagnosis not present

## 2017-03-19 DIAGNOSIS — D649 Anemia, unspecified: Secondary | ICD-10-CM

## 2017-03-19 DIAGNOSIS — I110 Hypertensive heart disease with heart failure: Secondary | ICD-10-CM | POA: Diagnosis not present

## 2017-03-19 DIAGNOSIS — I5022 Chronic systolic (congestive) heart failure: Secondary | ICD-10-CM | POA: Diagnosis not present

## 2017-03-19 DIAGNOSIS — Z955 Presence of coronary angioplasty implant and graft: Secondary | ICD-10-CM | POA: Diagnosis not present

## 2017-03-19 DIAGNOSIS — Z7982 Long term (current) use of aspirin: Secondary | ICD-10-CM | POA: Diagnosis not present

## 2017-03-19 DIAGNOSIS — I959 Hypotension, unspecified: Secondary | ICD-10-CM | POA: Diagnosis not present

## 2017-03-19 DIAGNOSIS — F329 Major depressive disorder, single episode, unspecified: Secondary | ICD-10-CM | POA: Insufficient documentation

## 2017-03-19 DIAGNOSIS — Z9181 History of falling: Secondary | ICD-10-CM | POA: Diagnosis not present

## 2017-03-19 DIAGNOSIS — F17210 Nicotine dependence, cigarettes, uncomplicated: Secondary | ICD-10-CM | POA: Insufficient documentation

## 2017-03-19 DIAGNOSIS — I952 Hypotension due to drugs: Secondary | ICD-10-CM | POA: Diagnosis not present

## 2017-03-19 LAB — PROTIME-INR
INR: 1.03
Prothrombin Time: 13.5 seconds (ref 11.4–15.2)

## 2017-03-19 LAB — CBC WITH DIFFERENTIAL/PLATELET
BASOS ABS: 0 10*3/uL (ref 0.0–0.1)
BASOS PCT: 0 %
EOS PCT: 2 %
Eosinophils Absolute: 0.2 10*3/uL (ref 0.0–0.7)
HEMATOCRIT: 33.1 % — AB (ref 39.0–52.0)
Hemoglobin: 10.3 g/dL — ABNORMAL LOW (ref 13.0–17.0)
LYMPHS PCT: 10 %
Lymphs Abs: 1 10*3/uL (ref 0.7–4.0)
MCH: 25.7 pg — ABNORMAL LOW (ref 26.0–34.0)
MCHC: 31.1 g/dL (ref 30.0–36.0)
MCV: 82.5 fL (ref 78.0–100.0)
Monocytes Absolute: 0.8 10*3/uL (ref 0.1–1.0)
Monocytes Relative: 8 %
NEUTROS ABS: 8.2 10*3/uL — AB (ref 1.7–7.7)
Neutrophils Relative %: 80 %
PLATELETS: 265 10*3/uL (ref 150–400)
RBC: 4.01 MIL/uL — AB (ref 4.22–5.81)
RDW: 17.3 % — AB (ref 11.5–15.5)
WBC: 10.2 10*3/uL (ref 4.0–10.5)

## 2017-03-19 LAB — COMPREHENSIVE METABOLIC PANEL
ALBUMIN: 3 g/dL — AB (ref 3.5–5.0)
ALT: 24 U/L (ref 17–63)
AST: 21 U/L (ref 15–41)
Alkaline Phosphatase: 64 U/L (ref 38–126)
Anion gap: 10 (ref 5–15)
BUN: 38 mg/dL — ABNORMAL HIGH (ref 6–20)
CHLORIDE: 103 mmol/L (ref 101–111)
CO2: 28 mmol/L (ref 22–32)
CREATININE: 1.17 mg/dL (ref 0.61–1.24)
Calcium: 8.5 mg/dL — ABNORMAL LOW (ref 8.9–10.3)
GFR calc Af Amer: >60 mL/min (ref >60)
GFR calc non Af Amer: >60 mL/min (ref >60)
GLUCOSE: 122 mg/dL — AB (ref 65–99)
POTASSIUM: 3.1 mmol/L — AB (ref 3.5–5.1)
Sodium: 141 mmol/L (ref 135–145)
Total Bilirubin: 0.8 mg/dL (ref 0.3–1.2)
Total Protein: 6.1 g/dL — ABNORMAL LOW (ref 6.5–8.1)

## 2017-03-19 LAB — TSH: TSH: 0.715 u[IU]/mL (ref 0.350–4.500)

## 2017-03-19 LAB — CORTISOL: CORTISOL PLASMA: 5 ug/dL

## 2017-03-19 LAB — TROPONIN I: TROPONIN I: 1.07 ng/mL — AB (ref <0.03)

## 2017-03-19 MED ORDER — PANTOPRAZOLE SODIUM 40 MG PO TBEC
40.0000 mg | DELAYED_RELEASE_TABLET | Freq: Every day | ORAL | Status: DC
  Administered 2017-03-20: 40 mg via ORAL
  Filled 2017-03-19: qty 1

## 2017-03-19 MED ORDER — TIOTROPIUM BROMIDE MONOHYDRATE 18 MCG IN CAPS
18.0000 ug | ORAL_CAPSULE | Freq: Every day | RESPIRATORY_TRACT | Status: DC
  Administered 2017-03-20: 18 ug via RESPIRATORY_TRACT
  Filled 2017-03-19 (×2): qty 5

## 2017-03-19 MED ORDER — ISOSORBIDE MONONITRATE ER 30 MG PO TB24
15.0000 mg | ORAL_TABLET | Freq: Every day | ORAL | Status: DC
  Administered 2017-03-20: 15 mg via ORAL
  Filled 2017-03-19: qty 1

## 2017-03-19 MED ORDER — OXYCODONE-ACETAMINOPHEN 5-325 MG PO TABS
1.0000 | ORAL_TABLET | Freq: Four times a day (QID) | ORAL | Status: DC | PRN
  Administered 2017-03-19 – 2017-03-20 (×2): 1 via ORAL
  Filled 2017-03-19 (×2): qty 1

## 2017-03-19 MED ORDER — WARFARIN SODIUM 5 MG PO TABS
5.0000 mg | ORAL_TABLET | Freq: Once | ORAL | Status: DC
  Filled 2017-03-19: qty 1

## 2017-03-19 MED ORDER — ATORVASTATIN CALCIUM 80 MG PO TABS: 80.0000 mg | ORAL_TABLET | Freq: Every evening | ORAL | Status: DC

## 2017-03-19 MED ORDER — CEPHALEXIN 500 MG PO CAPS: 500.0000 mg | ORAL_CAPSULE | Freq: Four times a day (QID) | ORAL | Status: DC

## 2017-03-19 MED ORDER — ENOXAPARIN SODIUM 100 MG/ML ~~LOC~~ SOLN
90.0000 mg | SUBCUTANEOUS | Status: DC
  Administered 2017-03-20: 90 mg via SUBCUTANEOUS
  Filled 2017-03-19: qty 1

## 2017-03-19 MED ORDER — SODIUM CHLORIDE 0.9 % IV BOLUS (SEPSIS)
500.0000 mL | Freq: Once | INTRAVENOUS | Status: AC
  Administered 2017-03-19: 500 mL via INTRAVENOUS

## 2017-03-19 MED ORDER — OXYCODONE HCL 5 MG PO TABS
5.0000 mg | ORAL_TABLET | Freq: Four times a day (QID) | ORAL | Status: DC | PRN
  Administered 2017-03-19: 5 mg via ORAL
  Filled 2017-03-19: qty 1

## 2017-03-19 MED ORDER — OXYCODONE-ACETAMINOPHEN 10-325 MG PO TABS: 1.0000 | ORAL_TABLET | Freq: Four times a day (QID) | ORAL | Status: DC | PRN

## 2017-03-19 MED ORDER — POTASSIUM CHLORIDE CRYS ER 20 MEQ PO TBCR
20.0000 meq | EXTENDED_RELEASE_TABLET | Freq: Once | ORAL | Status: AC
  Administered 2017-03-19: 20 meq via ORAL
  Filled 2017-03-19: qty 1

## 2017-03-19 MED ORDER — MIRTAZAPINE 30 MG PO TBDP
30.0000 mg | ORAL_TABLET | Freq: Every day | ORAL | Status: DC
  Filled 2017-03-19 (×2): qty 1

## 2017-03-19 MED ORDER — VITAMIN B-1 100 MG PO TABS
100.0000 mg | ORAL_TABLET | Freq: Every day | ORAL | Status: DC
  Administered 2017-03-20: 100 mg via ORAL
  Filled 2017-03-19: qty 1

## 2017-03-19 MED ORDER — AMIODARONE HCL 200 MG PO TABS
200.0000 mg | ORAL_TABLET | Freq: Every day | ORAL | Status: DC
Start: 2017-03-20 — End: 2017-03-20
  Administered 2017-03-20: 200 mg via ORAL
  Filled 2017-03-19: qty 1

## 2017-03-19 MED ORDER — ESCITALOPRAM OXALATE 20 MG PO TABS
20.0000 mg | ORAL_TABLET | Freq: Every day | ORAL | Status: DC
  Administered 2017-03-20: 20 mg via ORAL
  Filled 2017-03-19: qty 1

## 2017-03-19 MED ORDER — TIZANIDINE HCL 4 MG PO TABS
4.0000 mg | ORAL_TABLET | Freq: Every day | ORAL | Status: DC
  Filled 2017-03-19: qty 1

## 2017-03-19 MED ORDER — ALBUTEROL SULFATE (2.5 MG/3ML) 0.083% IN NEBU: 3.0000 mL | INHALATION_SOLUTION | Freq: Four times a day (QID) | RESPIRATORY_TRACT | Status: DC | PRN

## 2017-03-19 MED ORDER — ASPIRIN EC 81 MG PO TBEC
81.0000 mg | DELAYED_RELEASE_TABLET | Freq: Every day | ORAL | Status: DC
  Administered 2017-03-20: 81 mg via ORAL
  Filled 2017-03-19: qty 1

## 2017-03-19 MED ORDER — FOLIC ACID 1 MG PO TABS
1.0000 mg | ORAL_TABLET | Freq: Every day | ORAL | Status: DC
  Administered 2017-03-20: 1 mg
  Filled 2017-03-19: qty 1

## 2017-03-19 MED ORDER — MOMETASONE FURO-FORMOTEROL FUM 200-5 MCG/ACT IN AERO
2.0000 | INHALATION_SPRAY | Freq: Two times a day (BID) | RESPIRATORY_TRACT | Status: DC
  Administered 2017-03-19 – 2017-03-20 (×2): 2 via RESPIRATORY_TRACT
  Filled 2017-03-19 (×2): qty 8.8

## 2017-03-19 MED ORDER — TIZANIDINE HCL 4 MG PO TABS
8.0000 mg | ORAL_TABLET | Freq: Every day | ORAL | Status: DC
  Administered 2017-03-20: 8 mg via ORAL
  Filled 2017-03-19: qty 2

## 2017-03-19 MED ORDER — WARFARIN - PHARMACIST DOSING INPATIENT: Freq: Every day | Status: DC

## 2017-03-19 NOTE — Telephone Encounter (Signed)
Close encounter 

## 2017-03-19 NOTE — H&P (Signed)
TRH H&P   Patient Demographics:    [name], is a 71 y.o. male  MRN: [id number]   DOB - June 08, 1946  Admit Date - 03/19/2017  Outpatient Primary MD for the patient is Ardith Dark, [place]  Referring MD/NP/PA:  Brantley Stage  Outpatient Specialists: Grayland Jack (cardiologist)  Patient coming from:  Chief Complaint  Patient presents with  . Hypotension      HPI:    [name]  is a 71 y.o. 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  Pt was asymptomatic.    In ED,  Pt noted to be anemic, w Hgb 10.0, K=3.1,  Pt tx with 1L of NS in ED.  Pt bp improved, and transferred to [facility] stepdown for observation   Review of systems:    In addition to the HPI above,  No Fever-chills, No Headache, No changes with Vision or hearing, No problems swallowing food or Liquids, No Chest pain, Cough or Shortness of Breath, No Abdominal pain, No Nausea or Vommitting, Bowel movements are regular, No Blood in stool or Urine, No dysuria, No new skin rashes or bruises, No new joints pains-aches,  No new weakness, tingling, numbness in any extremity, No recent weight gain or loss, No polyuria, polydypsia or polyphagia, No significant Mental Stressors.  A full 10 point Review of Systems was done, except as stated above, all other Review of Systems were negative.   With Past History of the following :    Past Medical History:  Diagnosis Date  . AAA (abdominal aortic aneurysm) ([place])   . AAA (abdominal aortic aneurysm) without rupture ([place])   . Acute respiratory failure ([place])  03/13/2017  . Anxiety   . Atrial fibrillation (HCC)     Brief episode of  . Atrial fibrillation with rapid ventricular response ([place]) 01/06/2016  . Cardiomyopathy, ischemic 10/27/2014  . CHF (congestive heart failure) ([place])   . COPD (chronic obstructive pulmonary disease) ([place])   . Coronary artery disease    Multivessel, S/p stenting to LCx  . Coronary artery disease   . Depression   . Dyslipidemia   . Headache   . Hyperlipidemia   . Hypertension   . Myocardial infarction ([place])   . Peripheral vascular disease ([place])   . 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 Shortness of breath dyspnea   . Stroke ([place])   . Tobacco dependence   . Ventricular dysfunction    Mild Left      Past Surgical History:  Procedure Laterality Date  . ABDOMINAL AORTIC ANEURYSM REPAIR  01/03/2016   Procedure:  ANEURYSM ABDOMINAL AORTIC REPAIR USING 14MM X 8MM X40CM HEMASHIELD GOLD GRAFT;  Surgeon: Conrad Lake [place], MD;  Location: [facility] OR;  Service: Vascular;;  . ABDOMINAL AORTIC ENDOVASCULAR STENT GRAFT N/A 01/03/2016   Procedure: ABDOMINAL AORTIC ENDOVASCULAR STENT GRAFT IMPLANTED/EXPLANTED;  Surgeon: Conrad Ballinger, MD;  Location: [place];  Service: Vascular;  Laterality: N/A;  . BACK SURGERY     cervical  . HAND SURGERY Left    thumb surgery  . HERNIA REPAIR Right   . LEFT HEART CATH AND CORONARY ANGIOGRAPHY N/A 03/16/2017   Procedure: Left Heart Cath and Coronary Angiography;  Surgeon: Martinique, Peter M, MD;  Location: [place] CV LAB;  Service: Cardiovascular;  Laterality: N/A;  . LEFT HEART CATHETERIZATION WITH CORONARY ANGIOGRAM N/A 10/27/2014   Procedure: LEFT HEART CATHETERIZATION WITH CORONARY ANGIOGRAM;  Surgeon: Blane Ohara, MD;  Location: [facility] CATH LAB;  Service: Cardiovascular;  Laterality: N/A;  . NECK SURGERY    . stent     pt unsure of location  . US ECHOCARDIOGRAPHY  12-26-2008   EF 40-45%  . WRIST SURGERY Right       Social History:     Social History  Substance Use Topics  . Smoking status:  Current Every Day Smoker    Packs/day: 0.50    Years: 20.00    Types: Cigarettes  . Smokeless tobacco: Never Used  . Alcohol use Yes     Lives - at home  Mobility -  Walks by self   Family History :     Family History  Problem Relation Age of Onset  . AAA (abdominal aortic aneurysm) Mother   . Cancer Father        bone marrow      Home Medications:   Prior to Admission medications   Medication Sig Start Date End Date Taking? Authorizing Provider  albuterol (PROAIR HFA) 108 (90 Base) MCG/ACT inhaler Inhale 1-2 puffs into the lungs every 6 (six) hours as needed for wheezing or shortness of breath.  02/23/14   [provider]  amiodarone (PACERONE) 200 MG tablet Take 1 tablet (200 mg total) by mouth daily. 03/04/16   Nahser, Wonda Cheng, MD  amiodarone (PACERONE) 200 MG tablet Take 200 mg by mouth daily.    [provider]  aspirin EC 81 MG tablet Take 81 mg by mouth daily.    [provider]  aspirin EC 81 MG tablet Take 81 mg by mouth daily.    [provider]  atorvastatin (LIPITOR) 80 MG tablet Take 1 tablet (80 mg total) by mouth daily at 6 PM. 10/17/14   Almyra Deforest, PA  atorvastatin (LIPITOR) 80 MG tablet Take 80 mg by mouth every evening.    [provider]  BREO ELLIPTA 100-25 MCG/INH AEPB Inhale 1 puff into the lungs daily. 11/17/15   [provider]  budesonide-formoterol (SYMBICORT) 160-4.5 MCG/ACT inhaler Inhale 2 puffs into the lungs 2 (two) times daily. 01/01/17   Tanda Rockers, MD  budesonide-formoterol (SYMBICORT) 160-4.5 MCG/ACT inhaler Inhale 2 puffs into the lungs 2 (two) times daily.    [provider]  cephALEXin (KEFLEX) 500 MG capsule Take 1 capsule (500 mg total) by mouth every 6 (six) hours. 03/17/17 03/23/17  Reyne Dumas, MD  enoxaparin (LOVENOX) 100 MG/ML injection Inject 0.9 mLs (90 mg total) into the skin daily. 03/17/17   Reyne Dumas, MD  escitalopram (LEXAPRO) 20 MG tablet Take 20 mg by mouth  daily.    [provider]  escitalopram (LEXAPRO) 20 MG tablet Take 20 mg by mouth daily.    [provider]  fluticasone furoate-vilanterol (BREO ELLIPTA) 100-25 MCG/INH AEPB Inhale 1 puff into the lungs daily.    [provider]  folic acid (FOLVITE) 1 MG tablet Place 1 tablet (1 mg total) into feeding tube daily. 03/18/17   Reyne Dumas, MD  isosorbide mononitrate (IMDUR) 30 MG 24 hr tablet Take 0.5 tablets (15 mg total) by mouth daily. 03/17/17   Reyne Dumas, MD  lisinopril (PRINIVIL,ZESTRIL) 10 MG tablet Take 10 mg by mouth daily. 07/26/15   [provider]  losartan (COZAAR) 25 MG tablet Take 1 tablet (25 mg total) by mouth daily. 03/18/17   Reyne Dumas, MD  metoprolol succinate (TOPROL-XL) 25 MG 24 hr tablet Take 0.5 tablets (12.5 mg total) by mouth daily. 03/18/17   Reyne Dumas, MD  metoprolol tartrate (LOPRESSOR) 25 MG tablet Take 1 tablet (25 mg total) by mouth 2 (two) times daily. 03/04/16   Nahser, Wonda Cheng, MD  mirtazapine (REMERON SOL-TAB) 30 MG disintegrating tablet Take 30 mg by mouth at bedtime.  08/30/15   [provider]  mirtazapine (REMERON SOL-TAB) 30 MG disintegrating tablet Take 30 mg by mouth at bedtime.    [provider]  mometasone (NASONEX) 50 MCG/ACT nasal spray Place 2 sprays into the nose daily as needed (for sinus).  10/17/14   [provider]  nitroGLYCERIN (NITROSTAT) 0.4 MG SL tablet Place 0.4 mg under the tongue every 5 (five) minutes as needed for chest pain.     [provider]  nitroGLYCERIN (NITROSTAT) 0.4 MG SL tablet Place 0.4 mg under the tongue every 5 (five) minutes as needed for chest pain.    [provider]  oxyCODONE (OXY IR/ROXICODONE) 5 MG immediate release tablet Take 5 mg by mouth 2 (two) times daily as needed. 02/25/16   [provider]  oxyCODONE (OXY IR/ROXICODONE) 5 MG immediate release tablet Take 15 mg by mouth every 4 (four) hours. For chronic post op  pain    [provider]  oxyCODONE-acetaminophen (PERCOCET) 10-325 MG tablet Take 1 tablet by mouth every 6 (six) hours as needed for pain. 01/11/16   Rhyne, Hulen Shouts, PA-C  pantoprazole (PROTONIX) 40 MG tablet Take 1 tablet (40 mg total) by mouth daily. 03/18/17   Reyne Dumas, MD  thiamine 100 MG tablet Take 1 tablet (100 mg total) by mouth daily. 03/18/17   Reyne Dumas, MD  tiotropium (SPIRIVA HANDIHALER) 18 MCG inhalation capsule Place 18 mcg into inhaler and inhale daily.    [provider]  tiotropium (SPIRIVA) 18 MCG inhalation capsule Place 18 mcg into inhaler and inhale daily. 10/17/14   [provider]  tiZANidine (ZANAFLEX) 4 MG tablet Take 4-8 mg by mouth 2 (two) times daily. PT TAKES 2 TABLETS IN THE MORNING, 1 TABLET AT BEDTIME 07/26/15   [provider]  tiZANidine (ZANAFLEX) 4 MG tablet Take 4-8 mg by mouth See admin instructions. Take 8 mg every morning  Take 4 mg at bedtime    [provider]  warfarin (COUMADIN) 2.5 MG tablet Take 1 tablet (2.5 mg total) by mouth daily. 01/13/16   Rhyne, Hulen Shouts, PA-C  warfarin (COUMADIN) 2.5 MG tablet 2.5 mg -T,TH ,SAT 5 MG m-w-f-sun 03/17/17   Reyne Dumas, MD     Allergies:     Allergies  Allergen Reactions  . Aspirin Nausea Only and Other (See Comments)    Stomach upset  .  Ibuprofen Hives  . Aspirin Hives  . Ibuprofen Hives     Physical Exam:   Vitals  Blood pressure 112/67, pulse (!) 50, temperature 98.2 F (36.8 C), temperature source Oral, resp. rate 15, height 5\' 5"  (1.651 m), weight 57.6 kg (127 lb), SpO2 98 %.   1. General lying in bed in NAD,    2. Normal affect and insight, Not Suicidal or Homicidal, Awake Alert, Oriented X 3.  3. No F.N deficits, ALL C.Nerves Intact, Strength 5/5 all 4 extremities, Sensation intact all 4 extremities, Plantars down going.  4. Ears and Eyes appear Normal, Conjunctivae clear, PERRLA. Moist Oral Mucosa.  5. Supple Neck, No JVD, No  cervical lymphadenopathy appriciated, No Carotid Bruits.  6. Symmetrical Chest wall movement, Good air movement bilaterally, CTAB.  7. Bradycardic s1, s2, 2/6 sem  No Parasternal Heave.  8. Positive Bowel Sounds, Abdomen Soft, No tenderness, No organomegaly appriciated,No rebound -guarding or rigidity.  9.  No Cyanosis, Normal Skin Turgor, No Skin Rash or Bruise.  10. Good muscle tone,  joints appear normal , no effusions, Normal ROM.    11. No Palpable Lymph Nodes in Neck or Axillae       Data Review:    CBC  Recent Labs Lab 03/14/17 0414 03/15/17 0251 03/16/17 0236 03/17/17 0525 03/19/17 1505  WBC 7.3 7.3 12.7* 11.2* 10.2  HGB 9.9* 10.2* 10.3* 10.0* 10.3*  HCT 32.8* 33.4* 34.1* 33.2* 33.1*  PLT 266 270 288 243 265  MCV 81.6 80.1 81.0 81.4 82.5  MCH 24.6* 24.5* 24.5* 24.5* 25.7*  MCHC 30.2 30.5 30.2 30.1 31.1  RDW 16.9* 16.4* 16.5* 16.5* 17.3*  LYMPHSABS  --   --   --   --  1.0  MONOABS  --   --   --   --  0.8  EOSABS  --   --   --   --  0.2  BASOSABS  --   --   --   --  0.0   --------------------------------------------------------------------------  Chemistries   Recent Labs Lab 03/14/17 0414 03/17/17 0525 03/19/17 1505  NA 136 135 141  K 3.7 3.8 3.1*  CL 102 99* 103  CO2 27 28 28   GLUCOSE 129* 140* 122*  BUN 40* 38* 38*  CREATININE 0.78 0.81 1.17  CALCIUM 8.6* 8.6* 8.5*  AST 37  --  21  ALT 47  --  24  ALKPHOS 62  --  64  BILITOT 1.2  --  0.8   ------------------------------------------------------------------------------------------------------------------ estimated creatinine clearance is 48.5 mL/min (by C-G formula based on SCr of 1.17 mg/dL). ------------------------------------------------------------------------------------------------------------------ No results for input(s): TSH, T4TOTAL, T3FREE, THYROIDAB in the last 72 hours.  Invalid input(s): FREET3  Coagulation profile  Recent Labs Lab 03/14/17 0414 03/15/17 0251  03/16/17 0236 03/17/17 0525 03/19/17 1505  INR 1.20 1.20 1.18 1.10 1.03   ------------------------------------------------------------------------------------------------------------------- No results for input(s): DDIMER in the last 72 hours. -------------------------------------------------------------------------------------------------------------------  Cardiac Enzymes  Recent Labs Lab 03/14/17 0949  TROPONINI 5.55*   ------------------------------------------------------------------------------------------------------------------    Component Value Date/Time   BNP 793.5 (H) 03/15/2017 0251     ---------------------------------------------------------------------------------------------------------------  Urinalysis    Component Value Date/Time   COLORURINE YELLOW 03/10/2017 0230   APPEARANCEUR HAZY (A) 03/10/2017 0230   LABSPEC 1.014 03/10/2017 0230   PHURINE 5.0 03/10/2017 0230   GLUCOSEU >=500 (A) 03/10/2017 0230   HGBUR NEGATIVE 03/10/2017 0230   BILIRUBINUR NEGATIVE 03/10/2017 0230   KETONESUR NEGATIVE 03/10/2017 0230   PROTEINUR 30 (A) 03/10/2017 0230  UROBILINOGEN 1.0 10/15/2014 0449   NITRITE NEGATIVE 03/10/2017 0230   LEUKOCYTESUR NEGATIVE 03/10/2017 0230    --------------------------------------------------------------------------   Imaging Results:    No results found.     Assessment & Plan:    Active Problems:   Chronic obstructive pulmonary disease (HCC)   Hypotension   Bradycardia   Anemia   Hypokalemia    Hypotension Tele Trop I q6h x3 Check cortisol Consider cardiac echo if not improving Hold Losartan, Hold Metoprolol  Bradycardia Check tsh Hold metoprolol   Anemia Repeat cbc in am  Hypoalemia Replete Check cmp in am  Hyperglycemia (Hga1c=6.2) Check hga1c  CAD s/p NSTEMI s/p cath Cont Aspirin,  Hold metoprolol due to bradycardia Cont Imdur 15mg  po qday Cont Lipitor 80mg  po qhs  Ischemic CM (EF  25%)  Pafib Cont amiodarone Cont lovenox Coumadin pharmacy to dose  MSSA bacteremia Cont keflex  Copd Cont spriiva Start Dulera in place of symbicort   DVT Prophylaxis  Lovenox - SCDs   AM Labs Ordered, also please review Full Orders  Family Communication: Admission, patients condition and plan of care including tests being ordered have been discussed with the patient  who indicate understanding and agree with the plan and Code Status.  Code Status FULL CODE  Likely DC to  home  Condition GUARDED    Consults called: none  Admission status: observation  Time spent in minutes : 45 minutes  Jani Gravel M.D on 03/19/2017 at 7:39 PM  Between 7am to 7pm - Pager - [phone]. After 7pm go to [email or web address] - password [facility]  [facility] - Office  [phone]

## 2017-03-19 NOTE — Progress Notes (Signed)
Page hospitalist regarding troponin level and med rec.

## 2017-03-19 NOTE — Progress Notes (Signed)
[name] is a 71 yo with history of hypertension, AAA, CAD, COPD, CHF, Afib. Recently admitted with respiratory failure secondary to COPD exacerbation. He was discharged two days ago on new medications, (notably metoprolol increased and losartan substituted for lisinopril). He is asymptomatic.Last BP of 88/54 with bradycardia to 45s (sinus bradycardia on EKG). S/p 1L of IV fluids (still receiving). Accepted for observation. Stepdown.  Cordelia Poche, MD [facility] 03/19/2017, 4:48 PM Pager: [phone]

## 2017-03-19 NOTE — Progress Notes (Signed)
ANTICOAGULATION CONSULT NOTE - Initial Consult  Pharmacy Consult for warfarin Indication: atrial fibrillation  Allergies  Allergen Reactions  . Aspirin Nausea Only and Other (See Comments)    Stomach upset  . Ibuprofen Hives  . Aspirin Hives  . Ibuprofen Hives    Patient Measurements: Height: 5\' 6"  (167.6 cm) Weight: 123 lb 8 oz (56 kg) IBW/kg (Calculated) : 63.8   Vital Signs: Temp: 98.8 F (37.1 C) (07/12 1940) Temp Source: Oral (07/12 1940) BP: 112/67 (07/12 1906) Pulse Rate: 50 (07/12 1906)  Labs:  Recent Labs  03/17/17 0525 03/19/17 1505  HGB 10.0* 10.3*  HCT 33.2* 33.1*  PLT 243 265  LABPROT 14.3 13.5  INR 1.10 1.03  CREATININE 0.81 1.17    Estimated Creatinine Clearance: 47.2 mL/min (by C-G formula based on SCr of 1.17 mg/dL).   Medical History: Past Medical History:  Diagnosis Date  . AAA (abdominal aortic aneurysm) ([place])   . AAA (abdominal aortic aneurysm) without rupture ([place])   . Acute respiratory failure ([place]) 03/13/2017  . Anxiety   . Atrial fibrillation (HCC)     Brief episode of  . Atrial fibrillation with rapid ventricular response ([place]) 01/06/2016  . Cardiomyopathy, ischemic 10/27/2014  . CHF (congestive heart failure) ([place])   . COPD (chronic obstructive pulmonary disease) ([place])   . Coronary artery disease    Multivessel, S/p stenting to LCx  . Coronary artery disease   . Depression   . Dyslipidemia   . Headache   . Hyperlipidemia   . Hypertension   . Myocardial infarction ([place])   . Peripheral vascular disease ([place])   . Renal artery dissection ([place])   . Shortness of breath dyspnea   . Stroke ([place])   . Tobacco dependence   . Ventricular dysfunction    Mild Left  Assessment: 71 yo with history of hypertension, AAA, CAD, COPD, CHF, Afib. Recently admitted with respiratory failure secondary to COPD exacerbation.  Continue lovenox bridge with subtherapeutic INR of 1.0. Will give warfarin 5mg  tonight. Check INR in am. CBC stable.    Goal of Therapy:  INR 2-3 Monitor platelets by anticoagulation protocol: Yes   Plan:  Continue lovenox until INR therapeutic Warfarin 5mg  tonight Daily INR  Erin Hearing PharmD., BCPS Clinical Pharmacist Pager [phone] 03/19/2017 8:56 PM

## 2017-03-19 NOTE — Progress Notes (Signed)
Paged [facility] admissions to notifiy pt arrived to [id number].

## 2017-03-19 NOTE — ED Triage Notes (Signed)
He went to see his MD this am for a follow up after being in the hospital for an MI. He was told to come here for hypotension. States he was started on many new medications and he feels they are causing him to have low BP.

## 2017-03-19 NOTE — ED Provider Notes (Signed)
[facility] DEPT [facility] Provider Note   CSN: [id number] Arrival date & time: 03/19/17  1451     History   Chief Complaint Chief Complaint  Patient presents with  . Hypotension    HPI [name] is a 71 y.o. male.  HPI 71 year old male who presents with hypotension. He has a history of COPD, ischemic cardiomyopathy with EF of 40-45%, multivessel coronary artery disease status post stenting, hypertension, hyperlipidemia, and abdominal aortic aneurysm status post repair. She was just discharged from the hospital 2 days ago for admission for respiratory failure due to COPD exacerbation. During that time with demand ischemia, and left heart catheterization subsequently showed severe three-vessel coronary artery disease. No intervention was provided. He was discharged on multiple new blood pressure medications including metoprolol, losartan, an imdur he reports feeling well, and had routine follow-up appointment with his PCP today. In the office was noted to be hypotensive with systolic pressures in the 80s. I sent to ED for evaluation. He denies any lightheadedness or dizziness, syncope or near syncope, chest pain, difficulty breathing, fevers, or extreme fatigue. No lower extremity edema, orthopnea, or PND. No nausea, vomiting, diarrhea or GI bleeding. States that he feels well, and would not have come to the ED if it was not for his PCP telling him.. Past Medical History:  Diagnosis Date  . AAA (abdominal aortic aneurysm) ([place])   . AAA (abdominal aortic aneurysm) without rupture ([place])   . Acute respiratory failure ([place]) 03/13/2017  . Anxiety   . Atrial fibrillation (HCC)     Brief episode of  . Atrial fibrillation with rapid ventricular response ([place]) 01/06/2016  . Cardiomyopathy, ischemic 10/27/2014  . CHF (congestive heart failure) ([place])   . COPD (chronic obstructive pulmonary disease) ([place])   . Coronary artery disease    Multivessel, S/p stenting to LCx  . Coronary artery  disease   . Depression   . Dyslipidemia   . Headache   . Hyperlipidemia   . Hypertension   . Myocardial infarction ([place])   . Peripheral vascular disease ([place])   . 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 Shortness of breath dyspnea   . Stroke ([place])   . Tobacco dependence   . Ventricular dysfunction    Mild Left    Patient Active Problem List   Diagnosis Date Noted  . Hypotension 03/19/2017  . NSTEMI (non-ST elevated myocardial infarction) ([place])   . Coagulase negative Staphylococcus bacteremia 03/16/2017  . Demand ischemia of myocardium: Severe 3 vessel CAD, with hypoxic respiratory failure 03/16/2017  . Elevated troponin   . Pressure injury of skin 03/11/2017  . Acute respiratory failure ([place]) 03/10/2017  . Chronic obstructive pulmonary disease ([place])   . Atrial fibrillation with rapid ventricular response ([place]) 01/06/2016  . Abdominal aortic aneurysm (AAA) without rupture ([place]) 01/03/2016  . S/P AAA (abdominal aortic aneurysm) repair   . Cigarette smoker   . Acute respiratory failure with hypoxia ([place])   . Systolic congestive heart failure ([place])   . Cardiomyopathy, ischemic 10/27/2014  . Chronic cough   . Pulmonary infiltrate   . COPD  GOLD III/ still smoking  10/15/2014  . Bronchiectasis with acute exacerbation ([place]) 10/15/2014  . Tobacco abuse disorder 10/15/2014  . Persistent atrial fibrillation ([place])   . SVT (supraventricular tachycardia) ([place]) 10/14/2014  . 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 Abdominal mass 03/18/2013  . AAA (abdominal aortic aneurysm) without  rupture ([place]) 03/18/2013  . Weight loss 05/07/2011  . HYPERCHOLESTEROLEMIA  IIA 09/18/2008  . HYPERTENSION, BENIGN 09/18/2008  . CAD, NATIVE VESSEL 09/18/2008    Past Surgical History:  Procedure Laterality Date  . ABDOMINAL AORTIC ANEURYSM REPAIR  01/03/2016   Procedure: ANEURYSM ABDOMINAL AORTIC REPAIR USING  14MM X 8MM X40CM HEMASHIELD GOLD GRAFT;  Surgeon: Conrad Oak Park, MD;  Location: [facility];  Service: Vascular;;  . ABDOMINAL AORTIC ENDOVASCULAR STENT GRAFT N/A 01/03/2016   Procedure: ABDOMINAL AORTIC ENDOVASCULAR STENT GRAFT IMPLANTED/EXPLANTED;  Surgeon: Conrad Gene Autry, MD;  Location: [facility];  Service: Vascular;  Laterality: N/A;  . BACK SURGERY     cervical  . HAND SURGERY Left    thumb surgery  . HERNIA REPAIR Right   . LEFT HEART CATH AND CORONARY ANGIOGRAPHY N/A 03/16/2017   Procedure: Left Heart Cath and Coronary Angiography;  Surgeon: Martinique, Peter M, MD;  Location: [facility];  Service: Cardiovascular;  Laterality: N/A;  . LEFT HEART CATHETERIZATION WITH CORONARY ANGIOGRAM N/A 10/27/2014   Procedure: LEFT HEART CATHETERIZATION WITH CORONARY ANGIOGRAM;  Surgeon: Blane Ohara, MD;  Location: [facility] CATH LAB;  Service: Cardiovascular;  Laterality: N/A;  . NECK SURGERY    . stent     pt unsure of location  . US ECHOCARDIOGRAPHY  12-26-2008   EF 40-45%  . WRIST SURGERY Right        Home Medications    Prior to Admission medications   Medication Sig Start Date End Date Taking? Authorizing Provider  albuterol (PROAIR HFA) 108 (90 Base) MCG/ACT inhaler Inhale 1-2 puffs into the lungs every 6 (six) hours as needed for wheezing or shortness of breath.  02/23/14   [provider]  amiodarone (PACERONE) 200 MG tablet Take 1 tablet (200 mg total) by mouth daily. 03/04/16   Nahser, Wonda Cheng, MD  amiodarone (PACERONE) 200 MG tablet Take 200 mg by mouth daily.    [provider]  aspirin EC 81 MG tablet Take 81 mg by mouth daily.    [provider]  aspirin EC 81 MG tablet Take 81 mg by mouth daily.    [provider]  atorvastatin (LIPITOR) 80 MG tablet Take 1 tablet (80 mg total) by mouth daily at 6 PM. 10/17/14   Almyra Deforest, PA  atorvastatin (LIPITOR) 80 MG tablet Take 80 mg by mouth every evening.    [provider]  BREO ELLIPTA 100-25 MCG/INH AEPB  Inhale 1 puff into the lungs daily. 11/17/15   [provider]  budesonide-formoterol (SYMBICORT) 160-4.5 MCG/ACT inhaler Inhale 2 puffs into the lungs 2 (two) times daily. 01/01/17   Tanda Rockers, MD  budesonide-formoterol (SYMBICORT) 160-4.5 MCG/ACT inhaler Inhale 2 puffs into the lungs 2 (two) times daily.    [provider]  cephALEXin (KEFLEX) 500 MG capsule Take 1 capsule (500 mg total) by mouth every 6 (six) hours. 03/17/17 03/23/17  Reyne Dumas, MD  enoxaparin (LOVENOX) 100 MG/ML injection Inject 0.9 mLs (90 mg total) into the skin daily. 03/17/17   Reyne Dumas, MD  escitalopram (LEXAPRO) 20 MG tablet Take 20 mg by mouth daily.    [provider]  escitalopram (LEXAPRO) 20 MG tablet Take 20 mg by mouth daily.    [provider]  fluticasone furoate-vilanterol (BREO ELLIPTA) 100-25 MCG/INH AEPB Inhale 1 puff into the lungs daily.    [provider]  folic acid (FOLVITE) 1 MG tablet Place 1 tablet (1 mg total) into feeding tube  daily. 03/18/17   Reyne Dumas, MD  isosorbide mononitrate (IMDUR) 30 MG 24 hr tablet Take 0.5 tablets (15 mg total) by mouth daily. 03/17/17   Reyne Dumas, MD  lisinopril (PRINIVIL,ZESTRIL) 10 MG tablet Take 10 mg by mouth daily. 07/26/15   [provider]  losartan (COZAAR) 25 MG tablet Take 1 tablet (25 mg total) by mouth daily. 03/18/17   Reyne Dumas, MD  metoprolol succinate (TOPROL-XL) 25 MG 24 hr tablet Take 0.5 tablets (12.5 mg total) by mouth daily. 03/18/17   Reyne Dumas, MD  metoprolol tartrate (LOPRESSOR) 25 MG tablet Take 1 tablet (25 mg total) by mouth 2 (two) times daily. 03/04/16   Nahser, Wonda Cheng, MD  mirtazapine (REMERON SOL-TAB) 30 MG disintegrating tablet Take 30 mg by mouth at bedtime.  08/30/15   [provider]  mirtazapine (REMERON SOL-TAB) 30 MG disintegrating tablet Take 30 mg by mouth at bedtime.    [provider]  mometasone (NASONEX) 50 MCG/ACT nasal spray Place 2  sprays into the nose daily as needed (for sinus).  10/17/14   [provider]  nitroGLYCERIN (NITROSTAT) 0.4 MG SL tablet Place 0.4 mg under the tongue every 5 (five) minutes as needed for chest pain.     [provider]  nitroGLYCERIN (NITROSTAT) 0.4 MG SL tablet Place 0.4 mg under the tongue every 5 (five) minutes as needed for chest pain.    [provider]  oxyCODONE (OXY IR/ROXICODONE) 5 MG immediate release tablet Take 5 mg by mouth 2 (two) times daily as needed. 02/25/16   [provider]  oxyCODONE (OXY IR/ROXICODONE) 5 MG immediate release tablet Take 15 mg by mouth every 4 (four) hours. For chronic post op pain    [provider]  oxyCODONE-acetaminophen (PERCOCET) 10-325 MG tablet Take 1 tablet by mouth every 6 (six) hours as needed for pain. 01/11/16   Rhyne, Hulen Shouts, PA-C  pantoprazole (PROTONIX) 40 MG tablet Take 1 tablet (40 mg total) by mouth daily. 03/18/17   Reyne Dumas, MD  thiamine 100 MG tablet Take 1 tablet (100 mg total) by mouth daily. 03/18/17   Reyne Dumas, MD  tiotropium (SPIRIVA HANDIHALER) 18 MCG inhalation capsule Place 18 mcg into inhaler and inhale daily.    [provider]  tiotropium (SPIRIVA) 18 MCG inhalation capsule Place 18 mcg into inhaler and inhale daily. 10/17/14   [provider]  tiZANidine (ZANAFLEX) 4 MG tablet Take 4-8 mg by mouth 2 (two) times daily. PT TAKES 2 TABLETS IN THE MORNING, 1 TABLET AT BEDTIME 07/26/15   [provider]  tiZANidine (ZANAFLEX) 4 MG tablet Take 4-8 mg by mouth See admin instructions. Take 8 mg every morning  Take 4 mg at bedtime    [provider]  warfarin (COUMADIN) 2.5 MG tablet Take 1 tablet (2.5 mg total) by mouth daily. 01/13/16   Rhyne, Hulen Shouts, PA-C  warfarin (COUMADIN) 2.5 MG tablet 2.5 mg -T,TH ,SAT 5 MG m-w-f-sun 03/17/17   Reyne Dumas, MD    Family History Family History  Problem Relation Age of Onset  . AAA (abdominal aortic  aneurysm) Mother   . Cancer Father        bone marrow    Social History Social History  Substance Use Topics  . Smoking status: Current Every Day Smoker    Packs/day: 0.50    Years: 20.00    Types: Cigarettes  . Smokeless tobacco: Never Used  . Alcohol use Yes     Allergies  Aspirin; Ibuprofen; Aspirin; and Ibuprofen   Review of Systems Review of Systems  Constitutional: Negative for fever.  Respiratory: Negative for cough and shortness of breath.   Cardiovascular: Negative for chest pain.  Gastrointestinal: Negative for abdominal pain, blood in stool, diarrhea, nausea and vomiting.  Neurological: Negative for syncope and light-headedness.  Hematological: Bruises/bleeds easily.  All other systems reviewed and are negative.    Physical Exam Updated Vital Signs BP (!) 73/42   Pulse (!) 45   Temp 98.2 F (36.8 C) (Oral)   Resp 10   Ht 5\' 5"  (1.651 m)   Wt 57.6 kg (127 lb)   SpO2 93%   BMI 21.13 kg/m   Physical Exam Physical Exam  Nursing note and vitals reviewed. Constitutional: Well developed, malnourished appearing, chronically ill appearing, non-toxic, and in no acute distress Head: Normocephalic and atraumatic.  Mouth/Throat: Oropharynx is clear and moist.  Neck: Normal range of motion. Neck supple.  Cardiovascular: Bradycardic rate and regular rhythm.   Pulmonary/Chest: Effort normal and breath sounds normal.  Abdominal: Soft. There is no tenderness. There is no rebound and no guarding.  Musculoskeletal: Normal range of motion. no edema Neurological: Alert, no facial droop, fluent speech, moves all extremities symmetrically Skin: Skin is warm and dry.  Psychiatric: Cooperative   ED Treatments / Results  Labs (all labs ordered are listed, but only abnormal results are displayed) Labs Reviewed  CBC WITH DIFFERENTIAL/PLATELET - Abnormal; Notable for the following:       Result Value   RBC 4.01 (*)    Hemoglobin 10.3 (*)    HCT 33.1 (*)    MCH  25.7 (*)    RDW 17.3 (*)    Neutro Abs 8.2 (*)    All other components within normal limits  COMPREHENSIVE METABOLIC PANEL - Abnormal; Notable for the following:    Potassium 3.1 (*)    Glucose, Bld 122 (*)    BUN 38 (*)    Calcium 8.5 (*)    Total Protein 6.1 (*)    Albumin 3.0 (*)    All other components within normal limits  PROTIME-INR    EKG  EKG Interpretation  Date/Time:  Thursday March 19 2017 15:06:08 EDT Ventricular Rate:  50 PR Interval:    QRS Duration: 127 QT Interval:  499 QTC Calculation: 456 R Axis:   -40 Text Interpretation:  Sinus rhythm Ventricular premature complex Nonspecific IVCD with LAD Nonspecific T abnormalities, lateral leads bradycardia new. no acute ischemic changes  Confirmed by Brantley Stage [phone]) on 03/19/2017 4:05:24 PM       Radiology No results found.  Procedures Procedures (including critical care time)  Medications Ordered in ED Medications  sodium chloride 0.9 % bolus 500 mL (500 mLs Intravenous New Bag/Given 03/19/17 1608)  sodium chloride 0.9 % bolus 500 mL (500 mLs Intravenous New Bag/Given 03/19/17 1609)     Initial Impression / Assessment and Plan / ED Course  I have reviewed the triage vital signs and the nursing notes.  Pertinent labs & imaging results that were available during my care of the patient were reviewed by me and considered in my medical decision making (see chart for details).     Presenting with hypotension after recent admission for COPD exacerbation. He is in no acute distress, and asymptomatic. Blood pressures initially 80 systolic and briefly 70 systolic. Also bradycardic, sinus brady on EKG. Mentating normally. Suspect that this is related to recently starting a significant number of medications including increased dose of  metoprolol, Imdur, and losartan. With IV fluids, his blood pressure is improving to 97/57. Blood work overall reassuring without significant kidney injury or significant anemia. No symptoms  of worsening infection or sepsis. Discussed with Dr. Teryl Lucy from hospitalist service who admitted to stepdown at [facility] for observation given persistent hypotension and for potential medication adjustments.   Final Clinical Impressions(s) / ED Diagnoses   Final diagnoses:  Other specified hypotension    New Prescriptions New Prescriptions   No medications on file     Forde Dandy, MD 03/19/17 1649

## 2017-03-20 DIAGNOSIS — R001 Bradycardia, unspecified: Secondary | ICD-10-CM | POA: Diagnosis not present

## 2017-03-20 DIAGNOSIS — I959 Hypotension, unspecified: Secondary | ICD-10-CM | POA: Diagnosis not present

## 2017-03-20 DIAGNOSIS — J410 Simple chronic bronchitis: Secondary | ICD-10-CM

## 2017-03-20 LAB — COMPREHENSIVE METABOLIC PANEL
ALT: 23 U/L (ref 17–63)
AST: 20 U/L (ref 15–41)
Albumin: 2.7 g/dL — ABNORMAL LOW (ref 3.5–5.0)
Alkaline Phosphatase: 55 U/L (ref 38–126)
Anion gap: 5 (ref 5–15)
BUN: 29 mg/dL — AB (ref 6–20)
CHLORIDE: 108 mmol/L (ref 101–111)
CO2: 25 mmol/L (ref 22–32)
CREATININE: 0.84 mg/dL (ref 0.61–1.24)
Calcium: 8.5 mg/dL — ABNORMAL LOW (ref 8.9–10.3)
GFR calc Af Amer: >60 mL/min (ref >60)
GFR calc non Af Amer: >60 mL/min (ref >60)
Glucose, Bld: 104 mg/dL — ABNORMAL HIGH (ref 65–99)
Potassium: 3.5 mmol/L (ref 3.5–5.1)
SODIUM: 138 mmol/L (ref 135–145)
Total Bilirubin: 0.5 mg/dL (ref 0.3–1.2)
Total Protein: 5.6 g/dL — ABNORMAL LOW (ref 6.5–8.1)

## 2017-03-20 LAB — CBC
HCT: 33 % — ABNORMAL LOW (ref 39.0–52.0)
Hemoglobin: 9.7 g/dL — ABNORMAL LOW (ref 13.0–17.0)
MCH: 24.4 pg — ABNORMAL LOW (ref 26.0–34.0)
MCHC: 29.4 g/dL — ABNORMAL LOW (ref 30.0–36.0)
MCV: 83.1 fL (ref 78.0–100.0)
PLATELETS: 215 10*3/uL (ref 150–400)
RBC: 3.97 MIL/uL — AB (ref 4.22–5.81)
RDW: 17.4 % — ABNORMAL HIGH (ref 11.5–15.5)
WBC: 6.7 10*3/uL (ref 4.0–10.5)

## 2017-03-20 LAB — TROPONIN I
TROPONIN I: 0.93 ng/mL — AB (ref <0.03)
Troponin I: 0.67 ng/mL (ref <0.03)

## 2017-03-20 LAB — PROTIME-INR
INR: 1.04
Prothrombin Time: 13.6 seconds (ref 11.4–15.2)

## 2017-03-20 MED ORDER — METOPROLOL SUCCINATE ER 25 MG PO TB24
12.5000 mg | ORAL_TABLET | Freq: Every day | ORAL | Status: DC
  Filled 2017-03-20: qty 1

## 2017-03-20 MED ORDER — WARFARIN SODIUM 5 MG PO TABS: 5.0000 mg | ORAL_TABLET | Freq: Once | ORAL | Status: DC

## 2017-03-20 MED ORDER — CEPHALEXIN 500 MG PO CAPS
500.0000 mg | ORAL_CAPSULE | Freq: Four times a day (QID) | ORAL | Status: DC
  Administered 2017-03-20: 500 mg via ORAL
  Filled 2017-03-20: qty 1

## 2017-03-20 NOTE — Care Management Obs Status (Signed)
[facility] NOTIFICATION   Patient Details  Name: [name] MRN: [id number] Date of Birth: [birth year]   Medicare Observation Status Notification Given:  Yes    Zenon Mayo, RN 03/20/2017, 12:30 PM

## 2017-03-20 NOTE — Progress Notes (Signed)
ANTICOAGULATION CONSULT NOTE - Follow-Up Consult  Pharmacy Consult for warfarin Indication: atrial fibrillation  Patient Measurements: Height: 5\' 6"  (167.6 cm) Weight: 123 lb 8 oz (56 kg) IBW/kg (Calculated) : 63.8   Vital Signs: Temp: 98.7 F (37.1 C) (07/13 0728) Temp Source: Oral (07/13 0728) BP: 114/59 (07/13 0900) Pulse Rate: 51 (07/13 0900)  Labs:  Recent Labs  03/19/17 1505 03/19/17 2104 03/20/17 0238 03/20/17 0743  HGB 10.3*  --   --  9.7*  HCT 33.1*  --   --  33.0*  PLT 265  --   --  215  LABPROT 13.5  --   --  13.6  INR 1.03  --   --  1.04  CREATININE 1.17  --   --  0.84  TROPONINI  --  1.07* 0.93* 0.67*    Estimated Creatinine Clearance: 65.7 mL/min (by C-G formula based on SCr of 0.84 mg/dL).  Assessment: 44 YOF recently admitted 7/2-7/10 for respiratory failure in the setting of COPD, demand ischemia, and also found to have subsequent MSSE bacteremia. The patient represented on 7/12 with hypotension. Pharmacy has been consulted to resume warfarin with a lovenox bridge for hx Afib. Admit INR 1 - was on Enox 1.5 mg/kg bridge PTA.  INR today remains SUBtherapeutic (INR 1.04 << 1.03, goal of 2-3). Hgb/Hct slight drop, plts wnl. The patient was ordered to receive a dose yesterday evening however the patient refused and stated that he had already taken the dose at home.    Goal of Therapy:  INR 2-3 Monitor platelets by anticoagulation protocol: Yes   Plan:  1. Continue Lovenox 90 mg (1.5 mg/kg) SQ every 24 hours 2. Warfarin 5 mg x 1 dose at 1800 today 3. Will continue to monitor for any signs/symptoms of bleeding and will follow up with PT/INR in the a.m.   Thank you for allowing pharmacy to be a part of this patient's care.  Alycia Rossetti, PharmD, BCPS Clinical Pharmacist Clinical phone for 03/20/2017 from 7a-3:30p: [phone] If after 3:30p, please call main pharmacy at: [phone] 03/20/2017 10:27 AM

## 2017-03-20 NOTE — Care Management Note (Signed)
Case Management Note  Patient Details  Name: JOSHVA LABRECK MRN: [id number] Date of Birth: 1946-02-06  Subjective/Objective:   From home with son and brother n law, pta indep, presents with hypotension and bradycardia, he has PCP , Dr. Almedia Balls , and he has medication coverage and transportation at discharge.                  Action/Plan: DC home no needs.  Expected Discharge Date:  03/20/17               Expected Discharge Plan:  Home/Self Care  In-House Referral:     Discharge planning Services  CM Consult  Post Acute Care Choice:  NA Choice offered to:  NA  DME Arranged:  N/A DME Agency:  NA  HH Arranged:  NA HH Agency:  NA  Status of Service:  Completed, signed off  If discussed at [place] of Stay Meetings, dates discussed:    Additional Comments:  Zenon Mayo, RN 03/20/2017, 12:20 PM

## 2017-03-20 NOTE — Discharge Summary (Signed)
Physician Discharge Summary  [name] WUX:[id number] DOB: 01/16/1946 DOA: 03/19/2017  PCP: Ardith Dark, PA-C  Admit date: 03/19/2017 Discharge date: 03/20/2017  Admitted From: home Disposition:  home  Recommendations for Outpatient Follow-up:  1. Follow up with PCP in 3-5 days  Home Health: none Equipment/Devices: none  Discharge Condition: stable CODE STATUS: Full code Diet recommendation: heart healthy  HPI: Per Dr. Maudie Mercury,  [name]  is a 71 y.o. 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  Pt was asymptomatic.  In ED,  Pt noted to be anemic, w Hgb 10.0, K=3.1,  Pt tx with 1L of NS in ED.  Pt bp improved, and transferred to [facility] stepdown for observation  Hospital Course: Discharge Diagnoses:  Active Problems:   Chronic obstructive pulmonary disease (HCC)   Hypotension   Bradycardia   Anemia   Hypokalemia  Asymptomatic hypotension/bradycardia -patient was admitted to the hospital from PCP office due to hypotension and bradycardia.  He was asymptomatic.  He reported no chest pains, no palpitations, no lightheadedness or dizziness.  He denies any fever or chills.  He was recently hospitalized and discharged from the hospital 3 days ago for acute respiratory failure, COPD, and coagulase-negative Staphylococcus bacteremia. For full hospital course, please refer to discharge summary dated 03/17/2017.  In brief, he was evaluated by critical care, cardiology, electrophysiology as well as infectious disease.  He underwent a cardiac catheterization which showed no significant change from February 2016 cath, with known ischemic cardiomyopathy, recommendations were for  medical management.  He had an elevated troponin due to demand ischemia in the setting of respiratory failure and COPD.  He has 50% mid LAD, 85% first diagonal, 100% proximal LCx and 100% proximal RCA cardiology did not recommend PCI to the diagonal unless he develops significant angina.  His medications were adjusted during this hospital stay and he was started on ARB/beta-blocker.  During this admission he was admitted to stepdown due to hypotension, his ARB and beta blockers were held, his blood pressure is improved into the 253-664 systolic and his heart rate has remained in the mid to low 50s.  He remained asymptomatic, without any chest pain, without any dyspnea, able to ambulate in the hallway and feeling back to baseline.  He is asking to go home.  I would favor to hold beta blockers as well as ARB for now given hypotension and bradycardia on admission.  Per patient, his follow-up with PCP later this afternoon, I recommend that he keeps that appointment, and also needs to be seen again sometimes next week.  Based on heart rate and blood pressure, may need reintroducing beta blockers and ARB given heart failure he ideally will need to be on these medications Coronary artery disease -again patient without any chest pain, troponins are flat, during his prior hospital stay troponin was in the ~8 range, no trending down at 1.0 >> 0.9 >> 0.6 COPD -stable, no wheezing, on room air Paroxysmal A. Fib - CHADSvasc 4, continue amiodarone and Coumadin Anemia of chronic disease -stable Tobacco abuse -still smokes, counseled for cessation Ethanol use, in remission -stopped drinking a few years ago Coagulase negative negative staph bacteremia during prior hospital  stay -infectious disease were consulted, patient was discharged on Keflex which he still needs to be taking per prior discharge summary with end date as already established   Discharge Instructions   Allergies as of 03/20/2017      Reactions    Aspirin Nausea Only, Other (See Comments)   Stomach upset   Ibuprofen Hives   Aspirin Hives   Ibuprofen Hives      Medication List    STOP taking these medications   losartan 25 MG tablet Commonly known as:  COZAAR   metoprolol succinate 25 MG 24 hr tablet Commonly known as:  TOPROL-XL     TAKE these medications   amiodarone 200 MG tablet Commonly known as:  PACERONE Take 1 tablet (200 mg total) by mouth daily.   aspirin EC 81 MG tablet Take 81 mg by mouth daily.   atorvastatin 80 MG tablet Commonly known as:  LIPITOR Take 1 tablet (80 mg total) by mouth daily at 6 PM.   BREO ELLIPTA 100-25 MCG/INH Aepb Generic drug:  fluticasone furoate-vilanterol Inhale 1 puff into the lungs daily.   budesonide-formoterol 160-4.5 MCG/ACT inhaler Commonly known as:  SYMBICORT Inhale 2 puffs into the lungs 2 (two) times daily.   cephALEXin 500 MG capsule Commonly known as:  KEFLEX Take 1 capsule (500 mg total) by mouth every 6 (six) hours.   enoxaparin 100 MG/ML injection Commonly known as:  LOVENOX Inject 0.9 mLs (90 mg total) into the skin daily.   escitalopram 20 MG tablet Commonly known as:  LEXAPRO Take 20 mg by mouth daily.   folic acid 1 MG tablet Commonly known as:  FOLVITE Place 1 tablet (1 mg total) into feeding tube daily.   isosorbide mononitrate 30 MG 24 hr tablet Commonly known as:  IMDUR Take 0.5 tablets (15 mg total) by mouth daily.   mirtazapine 30 MG disintegrating tablet Commonly known as:  REMERON SOL-TAB Take 30 mg by mouth at bedtime.   NASONEX 50 MCG/ACT nasal spray Generic drug:  mometasone Place 2 sprays into the nose daily as needed (for sinus).   nitroGLYCERIN 0.4 MG SL tablet Commonly known as:  NITROSTAT Place 0.4 mg under the tongue every 5 (five) minutes as needed for chest pain.   oxyCODONE 5 MG immediate release tablet Commonly known as:  Oxy IR/ROXICODONE Take 15 mg by mouth every 4 (four) hours. For chronic post op pain     pantoprazole 40 MG tablet Commonly known as:  PROTONIX Take 1 tablet (40 mg total) by mouth daily.   PROAIR HFA 108 (90 Base) MCG/ACT inhaler Generic drug:  albuterol Inhale 1-2 puffs into the lungs every 6 (six) hours as needed for wheezing or shortness of breath.   SPIRIVA HANDIHALER 18 MCG inhalation capsule Generic drug:  tiotropium Place 18 mcg into inhaler and inhale daily.   thiamine 100 MG tablet Take 1 tablet (100 mg total) by mouth daily.   tiZANidine 4 MG tablet Commonly known as:  ZANAFLEX Take 4-8 mg by mouth See admin instructions. Take 8 mg every morning and take 4 mg at bedtime   warfarin 2.5 MG tablet Commonly known as:  COUMADIN 2.5 mg -T,TH ,SAT 5 MG m-w-f-sun What changed:  how much to take  how to take this  when to take this  additional instructions      Follow-up Information    Hedgecock, Vinnie Level, PA-C. Schedule an appointment as soon as possible for a visit in 3 day(s).   Specialty:  Physician  Radio producer information: [address] [place] [address] [phone]          Allergies  Allergen Reactions  . Aspirin Nausea Only and Other (See Comments)    Stomach upset  . Ibuprofen Hives  . Aspirin Hives  . Ibuprofen Hives    Consultations:  None  Procedures/Studies:  Ct Angio Chest Pe W Or Wo Contrast  Result Date: 03/10/2017 CLINICAL DATA:  [name]; shortness of breath and unresponsiveness. History of COPD and congestive heart failure. EXAM: CT ANGIOGRAPHY CHEST WITH CONTRAST TECHNIQUE: Multidetector CT imaging of the chest was performed using the standard protocol during bolus administration of intravenous contrast. Multiplanar CT image reconstructions and MIPs were obtained to evaluate the vascular anatomy. CONTRAST:  70 cc Isovue 370 COMPARISON:  03/09/2017 chest radiograph FINDINGS: Cardiovascular: Mild cardiomegaly. Severe coronary artery calcification. Moderate calcific atherosclerosis of the thoracic  aorta. Satisfactory opacification of the pulmonary arteries. No pulmonary embolus identified. Mediastinum/Nodes: No enlarged mediastinal, hilar, or axillary lymph nodes. Thyroid gland, trachea, and esophagus demonstrate no significant findings. Lungs/Pleura: Severe emphysema with lung apex predominance. Right upper lobe ground-glass opacity measures 12 mm (series 8, image 78). Left lower lobe solid 7 mm nodule (series 8, image 113). Bilateral lower lobe dependent consolidations, diffuse peribronchial thickening and scattered mucous plugging may represent pneumonia or aspiration. Small bilateral pleural effusions. Upper Abdomen: No acute abnormality. Musculoskeletal: No chest wall abnormality. No acute or significant osseous findings. Review of the MIP images confirms the above findings. IMPRESSION: 1. No pulmonary embolus identified. 2. Dependent consolidations with mucous plugging in the lung bases may represent pneumonia or aspiration. Small bilateral pleural effusions. 3. Mild cardiomegaly and severe coronary artery calcification. 4. 12 mm right upper lobe ground-glass nodule and 7 mm left lower lobe solid nodule. Non-contrast chest CT at 3-6 months is recommended. If the nodules are stable at time of repeat CT, then future CT at 18-24 months (from today's scan) is considered optional for low-risk patients, but is recommended for high-risk patients. This recommendation follows the consensus statement: Guidelines for Management of Incidental Pulmonary Nodules Detected on CT Images: From the Fleischner Society 2017; Radiology 2017; 284:228-243. Electronically Signed   By: Kristine Garbe M.D.   On: 03/10/2017 03:43   Dg Chest Port 1 View  Result Date: 03/14/2017 CLINICAL DATA:  Patient with respiratory failure. EXAM: PORTABLE CHEST 1 VIEW COMPARISON:  Chest radiograph 03/12/2017. FINDINGS: Patient is rotated. Persistent right peritracheal opacity. Monitoring leads overlie the patient. Stable cardiac and  mediastinal contours. Interval improvement in bilateral interstitial pulmonary opacities. No pleural effusion or pneumothorax. IMPRESSION: Improving interstitial opacities favored to represent improving edema. Persistent right peritracheal opacity. Recommend comparison with outside priors if available. If none are available, consider further evaluation the non acute setting with chest CT to exclude peritracheal mass. Cardiomegaly. Electronically Signed   By: Lovey Newcomer M.D.   On: 03/14/2017 09:54   Dg Chest Port 1 View  Result Date: 03/12/2017 CLINICAL DATA:  Respiratory failure EXAM: PORTABLE CHEST 1 VIEW COMPARISON:  03/12/2017 FINDINGS: Cardiac shadow is again enlarged. Vascular congestion remains with interstitial edema. The overall appearance given some technical variation in the film is slightly worsened particularly on the left. No bony abnormality is noted. IMPRESSION: Slight increase in degree of interstitial edema. Electronically Signed   By: Inez Catalina M.D.   On: 03/12/2017 15:08   Dg Chest Port 1 View  Result Date: 03/12/2017 CLINICAL DATA:  Respiratory failure. EXAM: PORTABLE CHEST 1 VIEW  COMPARISON:  No prior. FINDINGS: Mild mediastinal prominence most likely prominent great vessels. Cardiomegaly with pulmonary venous congestion and interstitial prominence suggesting mild CHF. Bibasilar atelectasis and infiltrates/edema. Tiny bilateral pleural effusions cannot be excluded. No pneumothorax. IMPRESSION: 1. Congestive heart failure bilateral interstitial edema. Tiny bilateral pleural effusions cannot be excluded. 2.  Bibasilar atelectasis and infiltrates/edema. Electronically Signed   By: Marcello Moores  Register   On: 03/12/2017 06:20   Dg Chest Portable 1 View  Result Date: 03/09/2017 CLINICAL DATA:  Endotracheal tube placement and orogastric tube placement. Initial encounter. EXAM: PORTABLE CHEST 1 VIEW COMPARISON:  None. FINDINGS: The patient's endotracheal tube is seen ending 6 cm above the  carina. An enteric tube is noted extending below the diaphragm. Right midlung airspace opacity may reflect pneumonia. Underlying interstitial prominence and bilateral fibrotic change are noted. No definite pleural effusion or pneumothorax is seen. The cardiomediastinal silhouette is mildly enlarged. No acute osseous abnormalities are identified. IMPRESSION: 1. Endotracheal tube seen ending 6 cm above the carina. Enteric tube noted extending below the diaphragm. 2. Right midlung airspace opacity raises concern for pneumonia. 3. Underlying interstitial prominence and bilateral fibrosis noted. 4. Mild cardiomegaly. Electronically Signed   By: Garald Balding M.D.   On: 03/09/2017 23:08     Subjective: - no chest pain, shortness of breath, no abdominal pain, nausea or vomiting.   Discharge Exam: Vitals:   03/20/17 0800 03/20/17 0900  BP: 123/66 (!) 114/59  Pulse: (!) 55 (!) 51  Resp: 16 14  Temp:     Vitals:   03/20/17 0728 03/20/17 0752 03/20/17 0800 03/20/17 0900  BP: 127/63  123/66 (!) 114/59  Pulse: (!) 52 (!) 58 (!) 55 (!) 51  Resp: 18 16 16 14   Temp: 98.7 F (37.1 C)     TempSrc: Oral     SpO2:  97% 96% 93%  Weight:      Height:        General: Pt is alert, awake, not in acute distress Cardiovascular: RRR, S1/S2 +, no rubs, no gallops Respiratory: CTA bilaterally, no wheezing, no rhonchi Abdominal: Soft, NT, ND, bowel sounds + Extremities: no edema, no cyanosis    The results of significant diagnostics from this hospitalization (including imaging, microbiology, ancillary and laboratory) are listed below for reference.     Microbiology: Recent Results (from the past 240 hour(s))  Culture, blood (routine x 2)     Status: None   Collection Time: 03/12/17 11:40 AM  Result Value Ref Range Status   Specimen Description BLOOD RIGHT ANTECUBITAL  Final   Special Requests   Final    BOTTLES DRAWN AEROBIC AND ANAEROBIC Blood Culture adequate volume   Culture NO GROWTH 5 DAYS   Final   Report Status 03/17/2017 FINAL  Final  Culture, blood (routine x 2)     Status: Abnormal   Collection Time: 03/12/17 11:49 AM  Result Value Ref Range Status   Specimen Description BLOOD LEFT ANTECUBITAL  Final   Special Requests IN PEDIATRIC BOTTLE Blood Culture adequate volume  Final   Culture  Setup Time   Final    GRAM POSITIVE COCCI IN CLUSTERS IN PEDIATRIC BOTTLE CRITICAL VALUE NOTED.  VALUE IS CONSISTENT WITH PREVIOUSLY REPORTED AND CALLED VALUE.    Culture (A)  Final    STAPHYLOCOCCUS HOMINIS SUSCEPTIBILITIES PERFORMED ON PREVIOUS CULTURE WITHIN THE LAST 5 DAYS.    Report Status 03/15/2017 FINAL  Final  Culture, blood (Routine X 2) w Reflex to ID Panel  Status: None (Preliminary result)   Collection Time: 03/20/17  7:43 AM  Result Value Ref Range Status   Specimen Description BLOOD LEFT ANTECUBITAL  Final   Special Requests IN PEDIATRIC BOTTLE Blood Culture adequate volume  Final   Culture PENDING  Incomplete   Report Status PENDING  Incomplete     Labs: BNP (last 3 results)  Recent Labs  03/10/17 0147 03/14/17 0949 03/15/17 0251  BNP 412.2* 1,130.7* 992.4*   Basic Metabolic Panel:  Recent Labs Lab 03/14/17 0414 03/17/17 0525 03/19/17 1505 03/20/17 0743  NA 136 135 141 138  K 3.7 3.8 3.1* 3.5  CL 102 99* 103 108  CO2 27 28 28 25   GLUCOSE 129* 140* 122* 104*  BUN 40* 38* 38* 29*  CREATININE 0.78 0.81 1.17 0.84  CALCIUM 8.6* 8.6* 8.5* 8.5*   Liver Function Tests:  Recent Labs Lab 03/14/17 0414 03/19/17 1505 03/20/17 0743  AST 37 21 20  ALT 47 24 23  ALKPHOS 62 64 55  BILITOT 1.2 0.8 0.5  PROT 6.1* 6.1* 5.6*  ALBUMIN 2.7* 3.0* 2.7*   No results for input(s): LIPASE, AMYLASE in the last 168 hours. No results for input(s): AMMONIA in the last 168 hours. CBC:  Recent Labs Lab 03/15/17 0251 03/16/17 0236 03/17/17 0525 03/19/17 1505 03/20/17 0743  WBC 7.3 12.7* 11.2* 10.2 6.7  NEUTROABS  --   --   --  8.2*  --   HGB 10.2* 10.3*  10.0* 10.3* 9.7*  HCT 33.4* 34.1* 33.2* 33.1* 33.0*  MCV 80.1 81.0 81.4 82.5 83.1  PLT 270 288 243 265 215   Cardiac Enzymes:  Recent Labs Lab 03/14/17 0949 03/19/17 2104 03/20/17 0238 03/20/17 0743  TROPONINI 5.55* 1.07* 0.93* 0.67*   BNP: Invalid input(s): POCBNP CBG:  Recent Labs Lab 03/16/17 1830 03/16/17 2116 03/17/17 0412 03/17/17 0739 03/17/17 1224  GLUCAP 208* 176* 140* 125* 158*   D-Dimer No results for input(s): DDIMER in the last 72 hours. Hgb A1c No results for input(s): HGBA1C in the last 72 hours. Lipid Profile No results for input(s): CHOL, HDL, LDLCALC, TRIG, CHOLHDL, LDLDIRECT in the last 72 hours. Thyroid function studies  Recent Labs  03/19/17 2104  TSH 0.715   Anemia work up No results for input(s): VITAMINB12, FOLATE, FERRITIN, TIBC, IRON, RETICCTPCT in the last 72 hours. Urinalysis    Component Value Date/Time   COLORURINE YELLOW 03/10/2017 0230   APPEARANCEUR HAZY (A) 03/10/2017 0230   LABSPEC 1.014 03/10/2017 0230   PHURINE 5.0 03/10/2017 0230   GLUCOSEU >=500 (A) 03/10/2017 0230   HGBUR NEGATIVE 03/10/2017 0230   BILIRUBINUR NEGATIVE 03/10/2017 0230   KETONESUR NEGATIVE 03/10/2017 0230   PROTEINUR 30 (A) 03/10/2017 0230   UROBILINOGEN 1.0 10/15/2014 0449   NITRITE NEGATIVE 03/10/2017 0230   LEUKOCYTESUR NEGATIVE 03/10/2017 0230   Sepsis Labs Invalid input(s): PROCALCITONIN,  WBC,  LACTICIDVEN Microbiology Recent Results (from the past 240 hour(s))  Culture, blood (routine x 2)     Status: None   Collection Time: 03/12/17 11:40 AM  Result Value Ref Range Status   Specimen Description BLOOD RIGHT ANTECUBITAL  Final   Special Requests   Final    BOTTLES DRAWN AEROBIC AND ANAEROBIC Blood Culture adequate volume   Culture NO GROWTH 5 DAYS  Final   Report Status 03/17/2017 FINAL  Final  Culture, blood (routine x 2)     Status: Abnormal   Collection Time: 03/12/17 11:49 AM  Result Value Ref Range Status   Specimen Description  BLOOD LEFT ANTECUBITAL  Final   Special Requests IN PEDIATRIC BOTTLE Blood Culture adequate volume  Final   Culture  Setup Time   Final    GRAM POSITIVE COCCI IN CLUSTERS IN PEDIATRIC BOTTLE CRITICAL VALUE NOTED.  VALUE IS CONSISTENT WITH PREVIOUSLY REPORTED AND CALLED VALUE.    Culture (A)  Final    STAPHYLOCOCCUS HOMINIS SUSCEPTIBILITIES PERFORMED ON PREVIOUS CULTURE WITHIN THE LAST 5 DAYS.    Report Status 03/15/2017 FINAL  Final  Culture, blood (Routine X 2) w Reflex to ID Panel     Status: None (Preliminary result)   Collection Time: 03/20/17  7:43 AM  Result Value Ref Range Status   Specimen Description BLOOD LEFT ANTECUBITAL  Final   Special Requests IN PEDIATRIC BOTTLE Blood Culture adequate volume  Final   Culture PENDING  Incomplete   Report Status PENDING  Incomplete     Time coordinating discharge: 40 minutes  SIGNED:  Marzetta Board, MD  [facility] 03/20/2017, 2:12 PM Pager [phone]  If 7PM-7AM, please contact night-coverage [email or web address] Password [email or web address]

## 2017-03-20 NOTE — Discharge Instructions (Signed)
Follow with Ardith Dark, PA-C in 5-7 days  Please get a complete blood count and chemistry panel checked by your Primary MD at your next visit, and again as instructed by your Primary MD. Please get your medications reviewed and adjusted by your Primary MD.  Please request your Primary MD to go over all Hospital Tests and Procedure/Radiological results at the follow up, please get all Hospital records sent to your Prim MD by signing hospital release before you go home.  If you had Pneumonia of Lung problems at the Hospital: Please get a 2 view Chest X ray done in 6-8 weeks after hospital discharge or sooner if instructed by your Primary MD.  If you have Congestive Heart Failure: Please call your Cardiologist or Primary MD anytime you have any of the following symptoms:  1) 3 pound weight gain in 24 hours or 5 pounds in 1 week  2) shortness of breath, with or without a dry hacking cough  3) swelling in the hands, feet or stomach  4) if you have to sleep on extra pillows at night in order to breathe  Follow cardiac low salt diet and 1.5 lit/day fluid restriction.  If you have diabetes Accuchecks 4 times/day, Once in AM empty stomach and then before each meal. Log in all results and show them to your primary doctor at your next visit. If any glucose reading is under 80 or above 300 call your primary MD immediately.  If you have Seizure/Convulsions/Epilepsy: Please do not drive, operate heavy machinery, participate in activities at heights or participate in high speed sports until you have seen by Primary MD or a Neurologist and advised to do so again.  If you had Gastrointestinal Bleeding: Please ask your Primary MD to check a complete blood count within one week of discharge or at your next visit. Your endoscopic/colonoscopic biopsies that are pending at the time of discharge, will also need to followed by your Primary MD.  Get Medicines reviewed and adjusted. Please take all your  medications with you for your next visit with your Primary MD  Please request your Primary MD to go over all hospital tests and procedure/radiological results at the follow up, please ask your Primary MD to get all Hospital records sent to his/her office.  If you experience worsening of your admission symptoms, develop shortness of breath, life threatening emergency, suicidal or homicidal thoughts you must seek medical attention immediately by calling 911 or calling your MD immediately  if symptoms less severe.  You must read complete instructions/literature along with all the possible adverse reactions/side effects for all the Medicines you take and that have been prescribed to you. Take any new Medicines after you have completely understood and accpet all the possible adverse reactions/side effects.   Do not drive or operate heavy machinery when taking Pain medications.   Do not take more than prescribed Pain, Sleep and Anxiety Medications  Special Instructions: If you have smoked or chewed Tobacco  in the last 2 yrs please stop smoking, stop any regular Alcohol  and or any Recreational drug use.  Wear Seat belts while driving.  Please note You were cared for by a hospitalist during your hospital stay. If you have any questions about your discharge medications or the care you received while you were in the hospital after you are discharged, you can call the unit and asked to speak with the hospitalist on call if the hospitalist that took care of you is not available. Once  you are discharged, your primary care physician will handle any further medical issues. Please note that NO REFILLS for any discharge medications will be authorized once you are discharged, as it is imperative that you return to your primary care physician (or establish a relationship with a primary care physician if you do not have one) for your aftercare needs so that they can reassess your need for medications and monitor your  lab values.  You can reach the hospitalist office at phone [phone] or fax [phone]   If you do not have a primary care physician, you can call [phone] for a physician referral.  Activity: As tolerated with Full fall precautions use walker/cane & assistance as needed  Diet: heart healthy  Disposition Home

## 2017-03-25 ENCOUNTER — Ambulatory Visit (HOSPITAL_COMMUNITY)
Admit: 2017-03-25 | Discharge: 2017-03-25 | Disposition: A | Discharge status: Home / Self Care | Payer: Medicare Other | Attending: Vascular Surgery | Admitting: Vascular Surgery

## 2017-03-25 ENCOUNTER — Ambulatory Visit (HOSPITAL_COMMUNITY)
Admit: 2017-03-25 | Discharge: 2017-03-25 | Disposition: A | Discharge status: Home / Self Care | Payer: Medicare Other | Attending: Internal Medicine | Admitting: Internal Medicine

## 2017-03-25 DIAGNOSIS — I248 Other forms of acute ischemic heart disease: Secondary | ICD-10-CM | POA: Diagnosis not present

## 2017-03-25 DIAGNOSIS — J9600 Acute respiratory failure, unspecified whether with hypoxia or hypercapnia: Secondary | ICD-10-CM | POA: Insufficient documentation

## 2017-03-25 DIAGNOSIS — R490 Dysphonia: Secondary | ICD-10-CM | POA: Insufficient documentation

## 2017-03-25 LAB — CULTURE, BLOOD (ROUTINE X 2)
CULTURE: NO GROWTH
CULTURE: NO GROWTH
SPECIAL REQUESTS: ADEQUATE
Special Requests: ADEQUATE

## 2017-03-25 NOTE — Procedures (Signed)
Objective Swallowing Evaluation: Type of Study: FEES-Fiberoptic Endoscopic Evaluation of Swallow  Patient Details  Name: [name] MRN: [id number] Date of Birth: 24-Feb-1946  Today's Date: 03/25/2017 Time: SLP Start Time (ACUTE ONLY): 1000-SLP Stop Time (ACUTE ONLY): 1034 SLP Time Calculation (min) (ACUTE ONLY): 34 min  Past Medical History:  Past Medical History:  Diagnosis Date  . AAA (abdominal aortic aneurysm) ([place])   . AAA (abdominal aortic aneurysm) without rupture ([place])   . Acute respiratory failure ([place]) 03/13/2017  . Anxiety   . Atrial fibrillation (HCC)     Brief episode of  . Atrial fibrillation with rapid ventricular response ([place]) 01/06/2016  . Cardiomyopathy, ischemic 10/27/2014  . CHF (congestive heart failure) ([place])   . COPD (chronic obstructive pulmonary disease) ([place])   . Coronary artery disease    Multivessel, S/p stenting to LCx  . Coronary artery disease   . Depression   . Dyslipidemia   . Headache   . Hyperlipidemia   . Hypertension   . Myocardial infarction ([place])   . Peripheral vascular disease ([place])   . 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 Shortness of breath dyspnea   . Stroke ([place])   . Tobacco dependence   . Ventricular dysfunction    Mild Left   Past Surgical History:  Past Surgical History:  Procedure Laterality Date  . ABDOMINAL AORTIC ANEURYSM REPAIR  01/03/2016   Procedure: ANEURYSM ABDOMINAL AORTIC REPAIR USING 14MM X 8MM X40CM HEMASHIELD GOLD GRAFT;  Surgeon: Conrad Bremer, MD;  Location: [facility];  Service: Vascular;;  . ABDOMINAL AORTIC ENDOVASCULAR STENT GRAFT N/A 01/03/2016   Procedure: ABDOMINAL AORTIC ENDOVASCULAR STENT GRAFT IMPLANTED/EXPLANTED;  Surgeon: Conrad [place], MD;  Location: [facility];  Service: Vascular;  Laterality: N/A;  . BACK SURGERY     cervical  . HAND SURGERY Left    thumb surgery  . HERNIA REPAIR Right   . LEFT HEART CATH AND CORONARY ANGIOGRAPHY N/A 03/16/2017   Procedure: Left Heart Cath and Coronary Angiography;   Surgeon: Martinique, Peter M, MD;  Location: [facility] CV LAB;  Service: Cardiovascular;  Laterality: N/A;  . LEFT HEART CATHETERIZATION WITH CORONARY ANGIOGRAM N/A 10/27/2014   Procedure: LEFT HEART CATHETERIZATION WITH CORONARY ANGIOGRAM;  Surgeon: Blane Ohara, MD;  Location: [facility] CATH LAB;  Service: Cardiovascular;  Laterality: N/A;  . NECK SURGERY    . stent     pt unsure of location  . US ECHOCARDIOGRAPHY  12-26-2008   EF 40-45%  . WRIST SURGERY Right    HPI: Pt arrives for an outpatient FEES after recent d/c on 03/17/17 for an admission 7/2 with complaints of shortness of breath. He was unresponsive on arrival to the ER and was intubated. The patient was noted to have vomited with concern for possible aspiration. CXR demonstrated RML opacity. Dx acute hypoxic resp failure/COPD exacerbation; NSTEMI. ETT 7/2-7/4. Pt was seen on 7/10 by SLP, presented with signs of aspiration and dysphonia, though this may have been present prior to intubation per daughter. Pt reports ongoing coughing with liquids since d/c.   Subjective: anxious to discharge   Assessment / Plan / Recommendation  CHL IP CLINICAL IMPRESSIONS 03/25/2017  Clinical Impression Pt demonstrates a mild pharyngeal dysphagia secondary to delayed airway protection in combination with incomplete vocal fold adduction. Pt complains of mild dysphonia, weak cough and frequent coughing with liquids about 2-3 weeks since extubation (though he may have had baseline dysphonia reported by dtr during acute stay, pt denies this today).  Pt noted to have persistent "chink" vs mild indentation at the posterior portion of the vocal folds preventing completed laryngeal closure. This is observed with phonation, breath hold and during swallow. Due to slightly delayed swallow intiation liquid do spill over and contact cords or even trickle past cords with pt sensation and expectoration. Attempted several strategies (chin tuck, sip and hold, breath hold) none  of which consistently prevented trace aspiration. Best method was simply telling pt to drink as he normally does to keep from getting choked. Pt has developed his own small sip, careful intake strategy that was most consistent. Given otherwise adequate strength and healthy mucosa of the vocal folds, suspect that pt has been gradually improving since extubation and is likely to experience continued improvment over the next few weeks, thou there is possibility of a more chronic impairment. Pt is sufficiently protecting his airway with a cough or small careful sips and should continue his current diet and precautions, but consider f/u with an ENT if his problem does not continue to improve. Suggested scheduling an appointment now as it may take a few weeks to see the MD.   SLP Visit Diagnosis Dysphagia, pharyngeal phase (R13.13)  Attention and concentration deficit following --  Frontal lobe and executive function deficit following --  Impact on safety and function Moderate aspiration risk      CHL IP TREATMENT RECOMMENDATION 03/25/2017  Treatment Recommendations Defer treatment plan to f/u with SLP     No flowsheet data found.  CHL IP DIET RECOMMENDATION 03/25/2017  SLP Diet Recommendations Regular solids;Thin liquid  Liquid Administration via Cup;Straw  Medication Administration Whole meds with liquid  Compensations Slow rate;Small sips/bites  Postural Changes --      CHL IP OTHER RECOMMENDATIONS 03/25/2017  Recommended Consults Consider ENT evaluation  Oral Care Recommendations Patient independent with oral care  Other Recommendations --      CHL IP FOLLOW UP RECOMMENDATIONS 03/25/2017  Follow up Recommendations Outpatient SLP      No flowsheet data found.         CHL IP ORAL PHASE 03/25/2017  Oral Phase WFL  Oral - Pudding Teaspoon --  Oral - Pudding Cup --  Oral - Honey Teaspoon --  Oral - Honey Cup --  Oral - Nectar Teaspoon --  Oral - Nectar Cup --  Oral - Nectar Straw --   Oral - Thin Teaspoon --  Oral - Thin Cup --  Oral - Thin Straw --  Oral - Puree --  Oral - Mech Soft --  Oral - Regular --  Oral - Multi-Consistency --  Oral - Pill --  Oral Phase - Comment --    CHL IP PHARYNGEAL PHASE 03/25/2017  Pharyngeal Phase Impaired  Pharyngeal- Pudding Teaspoon --  Pharyngeal --  Pharyngeal- Pudding Cup --  Pharyngeal --  Pharyngeal- Honey Teaspoon --  Pharyngeal --  Pharyngeal- Honey Cup --  Pharyngeal --  Pharyngeal- Nectar Teaspoon --  Pharyngeal --  Pharyngeal- Nectar Cup --  Pharyngeal --  Pharyngeal- Nectar Straw --  Pharyngeal --  Pharyngeal- Thin Teaspoon --  Pharyngeal --  Pharyngeal- Thin Cup Delayed swallow initiation-pyriform sinuses;Reduced airway/laryngeal closure;Penetration/Aspiration before swallow;Penetration/Aspiration during swallow;Trace aspiration  Pharyngeal Material enters airway, CONTACTS cords and then ejected out;Material enters airway, passes BELOW cords then ejected out;Material does not enter airway  Pharyngeal- Thin Straw Delayed swallow initiation-pyriform sinuses;Reduced airway/laryngeal closure;Penetration/Aspiration before swallow;Penetration/Aspiration during swallow;Trace aspiration  Pharyngeal Material enters airway, passes BELOW cords then ejected out;Material enters airway, CONTACTS cords  and then ejected out;Material does not enter airway  Pharyngeal- Puree WFL  Pharyngeal --  Pharyngeal- Mechanical Soft --  Pharyngeal --  Pharyngeal- Regular WFL  Pharyngeal --  Pharyngeal- Multi-consistency --  Pharyngeal --  Pharyngeal- Pill --  Pharyngeal --  Pharyngeal Comment --     No flowsheet data found.  No flowsheet data found. Herbie Baltimore, [place] CCC-SLP [phone]  Lynann Beaver 03/25/2017, 2:02 PM

## 2017-03-28 ENCOUNTER — Emergency Department (HOSPITAL_COMMUNITY)
Admission: EM | Admit: 2017-03-28 | Discharge: 2017-03-28 | Disposition: A | Discharge status: Home / Self Care | Payer: Medicare Other | Attending: Emergency Medicine | Admitting: Emergency Medicine

## 2017-03-28 ENCOUNTER — Emergency Department (HOSPITAL_COMMUNITY): Payer: Medicare Other

## 2017-03-28 ENCOUNTER — Encounter (HOSPITAL_COMMUNITY): Payer: Self-pay

## 2017-03-28 DIAGNOSIS — Z8673 Personal history of transient ischemic attack (TIA), and cerebral infarction without residual deficits: Secondary | ICD-10-CM | POA: Insufficient documentation

## 2017-03-28 DIAGNOSIS — I5020 Unspecified systolic (congestive) heart failure: Secondary | ICD-10-CM | POA: Diagnosis not present

## 2017-03-28 DIAGNOSIS — R42 Dizziness and giddiness: Secondary | ICD-10-CM | POA: Diagnosis not present

## 2017-03-28 DIAGNOSIS — F17210 Nicotine dependence, cigarettes, uncomplicated: Secondary | ICD-10-CM | POA: Insufficient documentation

## 2017-03-28 DIAGNOSIS — Z7982 Long term (current) use of aspirin: Secondary | ICD-10-CM | POA: Insufficient documentation

## 2017-03-28 DIAGNOSIS — I2510 Atherosclerotic heart disease of native coronary artery without angina pectoris: Secondary | ICD-10-CM | POA: Insufficient documentation

## 2017-03-28 DIAGNOSIS — Z7901 Long term (current) use of anticoagulants: Secondary | ICD-10-CM | POA: Diagnosis not present

## 2017-03-28 DIAGNOSIS — D649 Anemia, unspecified: Secondary | ICD-10-CM | POA: Diagnosis not present

## 2017-03-28 DIAGNOSIS — I951 Orthostatic hypotension: Secondary | ICD-10-CM | POA: Diagnosis not present

## 2017-03-28 DIAGNOSIS — J449 Chronic obstructive pulmonary disease, unspecified: Secondary | ICD-10-CM | POA: Insufficient documentation

## 2017-03-28 DIAGNOSIS — R531 Weakness: Secondary | ICD-10-CM | POA: Diagnosis not present

## 2017-03-28 DIAGNOSIS — I10 Essential (primary) hypertension: Secondary | ICD-10-CM | POA: Diagnosis not present

## 2017-03-28 DIAGNOSIS — R404 Transient alteration of awareness: Secondary | ICD-10-CM | POA: Diagnosis not present

## 2017-03-28 LAB — CBC WITH DIFFERENTIAL/PLATELET
BASOS ABS: 0 10*3/uL (ref 0.0–0.1)
Basophils Relative: 0 %
EOS ABS: 0.1 10*3/uL (ref 0.0–0.7)
EOS PCT: 1 %
HCT: 29.5 % — ABNORMAL LOW (ref 39.0–52.0)
Hemoglobin: 8.6 g/dL — ABNORMAL LOW (ref 13.0–17.0)
LYMPHS PCT: 12 %
Lymphs Abs: 0.8 10*3/uL (ref 0.7–4.0)
MCH: 25.1 pg — ABNORMAL LOW (ref 26.0–34.0)
MCHC: 29.2 g/dL — ABNORMAL LOW (ref 30.0–36.0)
MCV: 86 fL (ref 78.0–100.0)
Monocytes Absolute: 0.3 10*3/uL (ref 0.1–1.0)
Monocytes Relative: 5 %
Neutro Abs: 5.4 10*3/uL (ref 1.7–7.7)
Neutrophils Relative %: 82 %
PLATELETS: 173 10*3/uL (ref 150–400)
RBC: 3.43 MIL/uL — AB (ref 4.22–5.81)
RDW: 18.1 % — ABNORMAL HIGH (ref 11.5–15.5)
WBC: 6.7 10*3/uL (ref 4.0–10.5)

## 2017-03-28 LAB — BASIC METABOLIC PANEL
ANION GAP: 6 (ref 5–15)
BUN: 16 mg/dL (ref 6–20)
CO2: 26 mmol/L (ref 22–32)
Calcium: 8.4 mg/dL — ABNORMAL LOW (ref 8.9–10.3)
Chloride: 104 mmol/L (ref 101–111)
Creatinine, Ser: 1.25 mg/dL — ABNORMAL HIGH (ref 0.61–1.24)
GFR calc Af Amer: >60 mL/min (ref >60)
GFR, EST NON AFRICAN AMERICAN: 57 mL/min — AB (ref >60)
Glucose, Bld: 104 mg/dL — ABNORMAL HIGH (ref 65–99)
POTASSIUM: 5.2 mmol/L — AB (ref 3.5–5.1)
SODIUM: 136 mmol/L (ref 135–145)

## 2017-03-28 LAB — URINALYSIS, ROUTINE W REFLEX MICROSCOPIC
Bilirubin Urine: NEGATIVE
GLUCOSE, UA: NEGATIVE mg/dL
HGB URINE DIPSTICK: NEGATIVE
Ketones, ur: NEGATIVE mg/dL
NITRITE: NEGATIVE
Protein, ur: NEGATIVE mg/dL
SPECIFIC GRAVITY, URINE: 1.008 (ref 1.005–1.030)
pH: 6 (ref 5.0–8.0)

## 2017-03-28 LAB — I-STAT CG4 LACTIC ACID, ED: LACTIC ACID, VENOUS: 0.82 mmol/L (ref 0.5–1.9)

## 2017-03-28 MED ORDER — SODIUM CHLORIDE 0.9 % IV BOLUS (SEPSIS)
500.0000 mL | Freq: Once | INTRAVENOUS | Status: AC
  Administered 2017-03-28: 500 mL via INTRAVENOUS

## 2017-03-28 NOTE — ED Provider Notes (Signed)
[facility] DEPT Provider Note   CSN: [id number] Arrival date & time: 03/28/17  2020     History   Chief Complaint Chief Complaint  Patient presents with  . Weakness    HPI [name] is a 71 y.o. male.  Patient with history of atrial fibrillation, cardiomyopathy ischemic, coronary artery disease, high blood pressure, vascular disease, tobacco dependence, stroke, recent admission to the hospital for low blood pressure presents with dizziness and borderline blood pressure again. Similar to previous. No fevers or chills. No chest pain or shortness of breath. No abdominal pain or leg swelling. Patient feels lightheaded when he stands up. Patient denies melena or hematochezia. Patient was taken off a couple blood pressure medications last time he had hypotension he is unsure which ones. Patient is taking the medicines he was sent home from the hospital on.      Past Medical History:  Diagnosis Date  . AAA (abdominal aortic aneurysm) ([place])   . AAA (abdominal aortic aneurysm) without rupture ([place])   . Acute respiratory failure ([place]) 03/13/2017  . Anxiety   . Atrial fibrillation (HCC)     Brief episode of  . Atrial fibrillation with rapid ventricular response ([place]) 01/06/2016  . Cardiomyopathy, ischemic 10/27/2014  . CHF (congestive heart failure) ([place])   . COPD (chronic obstructive pulmonary disease) ([place])   . Coronary artery disease    Multivessel, S/p stenting to LCx  . Coronary artery disease   . Depression   . Dyslipidemia   . Headache   . Hyperlipidemia   . Hypertension   . Myocardial infarction ([place])   . Peripheral vascular disease ([place])   . 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 Shortness of breath dyspnea   . Stroke ([place])   . Tobacco dependence   . Ventricular dysfunction    Mild Left    Patient Active Problem List   Diagnosis Date Noted  . Hypotension 03/19/2017  . Bradycardia 03/19/2017  . Anemia 03/19/2017  . Hypokalemia 03/19/2017  . NSTEMI (non-ST  elevated myocardial infarction) ([place])   . Coagulase negative Staphylococcus bacteremia 03/16/2017  . Demand ischemia of myocardium: Severe 3 vessel CAD, with hypoxic respiratory failure 03/16/2017  . Elevated troponin   . Pressure injury of skin 03/11/2017  . Acute respiratory failure ([place]) 03/10/2017  . Chronic obstructive pulmonary disease ([place])   . Atrial fibrillation with rapid ventricular response ([place]) 01/06/2016  . Abdominal aortic aneurysm (AAA) without rupture ([place]) 01/03/2016  . S/P AAA (abdominal aortic aneurysm) repair   . Cigarette smoker   . Acute respiratory failure with hypoxia ([place])   . Systolic congestive heart failure ([place])   . Cardiomyopathy, ischemic 10/27/2014  . Chronic cough   . Pulmonary infiltrate   . COPD  GOLD III/ still smoking  10/15/2014  . Bronchiectasis with acute exacerbation ([place]) 10/15/2014  . Tobacco abuse disorder 10/15/2014  . Persistent atrial fibrillation ([place])   . SVT (supraventricular tachycardia) ([place]) 10/14/2014  . 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 Abdominal mass 03/18/2013  . AAA (abdominal aortic aneurysm) without rupture ([place]) 03/18/2013  . Weight loss 05/07/2011  . HYPERCHOLESTEROLEMIA  IIA 09/18/2008  . HYPERTENSION, BENIGN 09/18/2008  . CAD, NATIVE VESSEL 09/18/2008    Past Surgical History:  Procedure Laterality Date  . ABDOMINAL AORTIC ANEURYSM REPAIR  01/03/2016   Procedure: ANEURYSM ABDOMINAL AORTIC REPAIR USING 14MM X 8MM X40CM HEMASHIELD GOLD GRAFT;  Surgeon:  Conrad Stonybrook, MD;  Location: [facility];  Service: Vascular;;  . ABDOMINAL AORTIC ENDOVASCULAR STENT GRAFT N/A 01/03/2016   Procedure: ABDOMINAL AORTIC ENDOVASCULAR STENT GRAFT IMPLANTED/EXPLANTED;  Surgeon: Conrad Bethel, MD;  Location: [facility];  Service: Vascular;  Laterality: N/A;  . BACK SURGERY     cervical  . HAND SURGERY Left    thumb surgery  . HERNIA REPAIR Right    . LEFT HEART CATH AND CORONARY ANGIOGRAPHY N/A 03/16/2017   Procedure: Left Heart Cath and Coronary Angiography;  Surgeon: Martinique, Peter M, MD;  Location: [facility] CV LAB;  Service: Cardiovascular;  Laterality: N/A;  . LEFT HEART CATHETERIZATION WITH CORONARY ANGIOGRAM N/A 10/27/2014   Procedure: LEFT HEART CATHETERIZATION WITH CORONARY ANGIOGRAM;  Surgeon: Blane Ohara, MD;  Location: [facility] CATH LAB;  Service: Cardiovascular;  Laterality: N/A;  . NECK SURGERY    . stent     pt unsure of location  . US ECHOCARDIOGRAPHY  12-26-2008   EF 40-45%  . WRIST SURGERY Right        Home Medications    Prior to Admission medications   Medication Sig Start Date End Date Taking? Authorizing Provider  albuterol (PROAIR HFA) 108 (90 Base) MCG/ACT inhaler Inhale 1-2 puffs into the lungs every 6 (six) hours as needed for wheezing or shortness of breath.  02/23/14   [provider]  amiodarone (PACERONE) 200 MG tablet Take 1 tablet (200 mg total) by mouth daily. 03/04/16   Nahser, Wonda Cheng, MD  aspirin EC 81 MG tablet Take 81 mg by mouth daily.    [provider]  atorvastatin (LIPITOR) 80 MG tablet Take 1 tablet (80 mg total) by mouth daily at 6 PM. 10/17/14   Meng, Scurry, PA  BREO ELLIPTA 100-25 MCG/INH AEPB Inhale 1 puff into the lungs daily. 11/17/15   [provider]  budesonide-formoterol (SYMBICORT) 160-4.5 MCG/ACT inhaler Inhale 2 puffs into the lungs 2 (two) times daily. 01/01/17   Tanda Rockers, MD  enoxaparin (LOVENOX) 100 MG/ML injection Inject 0.9 mLs (90 mg total) into the skin daily. 03/17/17   Reyne Dumas, MD  escitalopram (LEXAPRO) 20 MG tablet Take 20 mg by mouth daily.    [provider]  folic acid (FOLVITE) 1 MG tablet Place 1 tablet (1 mg total) into feeding tube daily. 03/18/17   Reyne Dumas, MD  isosorbide mononitrate (IMDUR) 30 MG 24 hr tablet Take 0.5 tablets (15 mg total) by mouth daily. 03/17/17   Reyne Dumas, MD  mirtazapine (REMERON SOL-TAB)  30 MG disintegrating tablet Take 30 mg by mouth at bedtime.  08/30/15   [provider]  mometasone (NASONEX) 50 MCG/ACT nasal spray Place 2 sprays into the nose daily as needed (for sinus).  10/17/14   [provider]  nitroGLYCERIN (NITROSTAT) 0.4 MG SL tablet Place 0.4 mg under the tongue every 5 (five) minutes as needed for chest pain.     [provider]  oxyCODONE (OXY IR/ROXICODONE) 5 MG immediate release tablet Take 15 mg by mouth every 4 (four) hours. For chronic post op pain    [provider]  pantoprazole (PROTONIX) 40 MG tablet Take 1 tablet (40 mg total) by mouth daily. 03/18/17   Reyne Dumas, MD  thiamine 100 MG tablet Take 1 tablet (100 mg total) by mouth daily. 03/18/17   Reyne Dumas, MD  tiotropium (SPIRIVA HANDIHALER) 18 MCG inhalation capsule Place 18 mcg into inhaler and inhale daily.    [provider]  tiZANidine (ZANAFLEX) 4 MG tablet Take 4-8 mg by mouth See admin instructions. Take 8 mg every morning and take 4 mg at bedtime    [provider]  warfarin (COUMADIN) 2.5 MG tablet 2.5 mg -T,TH ,SAT 5 MG m-w-f-sun Patient taking differently: Take 2.5-5 mg by mouth See admin instructions. Take 2.5 mg by mouth daily on Tuesday, Thursday and Saturday. Take 5 mg by mouth daily on all other days 03/17/17   Reyne Dumas, MD    Family History Family History  Problem Relation Age of Onset  . AAA (abdominal aortic aneurysm) Mother   . Cancer Father        bone marrow    Social History Social History  Substance Use Topics  . Smoking status: Current Every Day Smoker    Packs/day: 0.50    Years: 20.00    Types: Cigarettes  . Smokeless tobacco: Never Used  . Alcohol use No     Allergies   Aspirin; Ibuprofen; Aspirin; and Ibuprofen   Review of Systems Review of Systems  Constitutional: Positive for fatigue. Negative for chills and fever.  HENT: Negative for congestion.   Eyes: Negative for visual disturbance.    Respiratory: Negative for shortness of breath.   Cardiovascular: Negative for chest pain.  Gastrointestinal: Negative for abdominal pain and vomiting.  Genitourinary: Negative for dysuria and flank pain.  Musculoskeletal: Negative for back pain, neck pain and neck stiffness.  Skin: Negative for rash.  Neurological: Positive for light-headedness. Negative for headaches.     Physical Exam Updated Vital Signs BP 128/70   Pulse (!) 58   Temp 98.2 F (36.8 C) (Oral)   Resp 18   Ht 5\' 6"  (1.676 m)   Wt 60.8 kg (134 lb)   SpO2 93%   BMI 21.63 kg/m   Physical Exam  Constitutional: He is oriented to person, place, and time. He appears well-developed and well-nourished.  HENT:  Head: Normocephalic and atraumatic.  Mild dry mucous membranes  Eyes: Right eye exhibits no discharge. Left eye exhibits no discharge.  Neck: Normal range of motion. Neck supple. No tracheal deviation present.  Cardiovascular: Normal rate and regular rhythm.   Pulmonary/Chest: Effort normal and breath sounds normal.  Abdominal: Soft. He exhibits no distension. There is no tenderness. There is no guarding.  Musculoskeletal: He exhibits no edema.  Neurological: He is alert and oriented to person, place, and time. No cranial nerve deficit.  Skin: Skin is warm. No rash noted.  Psychiatric: He has a normal mood and affect.  Nursing note and vitals reviewed.    ED Treatments / Results  Labs (all labs ordered are listed, but only abnormal results are displayed) Labs Reviewed  CBC WITH DIFFERENTIAL/PLATELET - Abnormal; Notable for the following:       Result Value   RBC 3.43 (*)    Hemoglobin 8.6 (*)    HCT 29.5 (*)    MCH 25.1 (*)    MCHC 29.2 (*)    RDW 18.1 (*)    All other components within normal limits  BASIC METABOLIC PANEL - Abnormal; Notable for the following:    Potassium 5.2 (*)    Glucose, Bld 104 (*)    Creatinine, Ser 1.25 (*)    Calcium 8.4 (*)    GFR calc non Af Amer 57 (*)    All  other components within normal limits  URINALYSIS, ROUTINE W REFLEX MICROSCOPIC  I-STAT CG4 LACTIC ACID, ED  POC OCCULT BLOOD, ED  EKG  EKG Interpretation  Date/Time:  Saturday March 28 2017 20:21:32 EDT Ventricular Rate:  54 PR Interval:    QRS Duration: 119 QT Interval:  471 QTC Calculation: 447 R Axis:   -27 Text Interpretation:  Sinus rhythm Borderline prolonged PR interval Nonspecific intraventricular conduction delay Abnormal T, consider ischemia, lateral leads similar previous Confirmed by Elnora Morrison [phone]) on 03/28/2017 8:39:08 PM       Radiology Dg Chest 2 View  Result Date: 03/28/2017 CLINICAL DATA:  Hypotension.  Weakness and dizziness. EXAM: CHEST  2 VIEW COMPARISON:  Radiographs 03/09/2017, CT 03/10/2017 FINDINGS: Unchanged cardiomegaly and tortuous atherosclerotic thoracic aorta. Lungs are hyperinflated with emphysema. No pulmonary edema. Improved bibasilar aeration from prior exam. No definite pleural effusion, minimal blunting of the posterior costophrenic angles likely due to hyperinflation. No new focal airspace disease. No pneumothorax. The bones are under mineralized. IMPRESSION: 1. Emphysema. Improved lung base aeration from prior exam. Pleural effusions have diminished from prior. 2. Stable cardiomegaly with tortuous atherosclerotic thoracic aorta. Electronically Signed   By: Jeb Levering M.D.   On: 03/28/2017 21:31    Procedures Procedures (including critical care time)  Medications Ordered in ED Medications  sodium chloride 0.9 % bolus 500 mL (500 mLs Intravenous New Bag/Given 03/28/17 2152)     Initial Impression / Assessment and Plan / ED Course  I have reviewed the triage vital signs and the nursing notes.  Pertinent labs & imaging results that were available during my care of the patient were reviewed by me and considered in my medical decision making (see chart for details).    Patient presents with orthostatic hypotension. Patient's  blood pressure with standing 17E systolic. Plan for IV fluid bolus oral fluids. Screening blood work did reveal mild worsening anemia patient is not actively bleeding and denies gross blood. Plan for Hemoccult.  Patient felt improved on reassessment. Patient declined rectal exam denies bleeding. Discussed anemia has worsened. Discussed holding all blood pressure medications until instructed specifically by cardiology or primary doctor. IV and oral fluids given. Patient ambulate in ER. Patient's daughter is with the patient and both are comfortable with close outpatient follow-up. Results and differential diagnosis were discussed with the patient/parent/guardian. Xrays were independently reviewed by myself.  Close follow up outpatient was discussed, comfortable with the plan.   Medications  sodium chloride 0.9 % bolus 500 mL (500 mLs Intravenous New Bag/Given 03/28/17 2152)    Vitals:   03/28/17 2025 03/28/17 2028 03/28/17 2130 03/28/17 2200  BP: 108/60  104/63 128/70  Pulse: (!) 56  (!) 52 (!) 58  Resp: 17  (!) 8 18  Temp: 98.2 F (36.8 C)     TempSrc: Oral     SpO2: 100%  99% 93%  Weight:  60.8 kg (134 lb)    Height:  5\' 6"  (1.676 m)      Final diagnoses:  Orthostatic hypotension  Anemia, unspecified type  Lightheadedness     Final Clinical Impressions(s) / ED Diagnoses   Final diagnoses:  Orthostatic hypotension  Anemia, unspecified type  Lightheadedness    New Prescriptions New Prescriptions   No medications on file     Elnora Morrison, MD 03/28/17 2254

## 2017-03-28 NOTE — ED Notes (Signed)
Pt refusing to have the POC occult blood completed, states he knows he doesn't have any blood on his stool. RN notified.

## 2017-03-28 NOTE — ED Notes (Signed)
Fluids and [place] sandwich given

## 2017-03-28 NOTE — Discharge Instructions (Addendum)
Stay well-hydrated. If you feel lightheaded please sit down so you do not passout. Please DO NOT take your blood pressure medications (metoprolol, imdur) until instructed by primary Dr or cardiology.  If you were given medicines take as directed.  If you are on coumadin or contraceptives realize their levels and effectiveness is altered by many different medicines.  If you have any reaction (rash, tongues swelling, other) to the medicines stop taking and see a physician.    If your blood pressure was elevated in the ER make sure you follow up for management with a primary doctor or return for chest pain, shortness of breath or stroke symptoms.  Please follow up as directed and return to the ER or see a physician for new or worsening symptoms.  Thank you. Vitals:   03/28/17 2025 03/28/17 2028 03/28/17 2130 03/28/17 2200  BP: 108/60  104/63 128/70  Pulse: (!) 56  (!) 52 (!) 58  Resp: 17  (!) 8 18  Temp: 98.2 F (36.8 C)     TempSrc: Oral     SpO2: 100%  99% 93%  Weight:  60.8 kg (134 lb)    Height:  5\' 6"  (1.676 m)

## 2017-03-28 NOTE — ED Triage Notes (Signed)
Pt BIB [facility] from fire dept. Where pt drove himself. He reports he was trying to go to [facility] to "check BP but they were closed". Pt reports he has been weak and dizzy for a week with decreased PO intake stating he only drinks hot chocolate and Pepsi. Pt ortho positive sitting at 90/70 and standing at 70/40. Pt given 1L NS IV PTA.

## 2017-03-28 NOTE — ED Notes (Signed)
Pt ambulated in hallway with stand by assist; steady gait noted; pt denied lightheadedness or dizziness

## 2017-04-06 ENCOUNTER — Ambulatory Visit: Payer: Medicare Other | Admitting: Cardiology

## 2017-04-08 NOTE — Progress Notes (Signed)
Cardiology Office Note:    Date:  04/09/2017   ID:  [name], DOB 04/17/1946, MRN [id number]  PCP:  Ardith Dark, PA-C  Cardiologist:  Dr. [name]  Referring MD: Ardith Dark, PA-C   Chief Complaint  Patient presents with  . Hospitalization Follow-up    History of Present Illness:    [name] is a 71 y.o. male with a past medical history significant for Paroxysmal atrial fibrillation, DCCV for WCT, chronic systolic CHF, AAA with repair in 2017, CAD with multivessel disease, HLD, tobacco use, COPD here today alone for hospital follow up.  The patient was admitted to the hospital from 03/09/17-03/17/17 for acute respiratory failure and was intubated. During that admission he was found to have elevated troponin levels and a cardiac catheterization was done. The left heart cath showed severe native CAD but there was no significant change as compared to a prior cath in February 2016. It was suspected that his elevated troponins were due to demand ischemia in the setting of acute respiratory failure/COPD. LVEDP was low and diuretic was discontinued. Low-dose Imdur was added with plans to increase if patient tolerates. The patient has a history of paroxysmal atrial fibrillation, maintaining sinus rhythm throughout his hospitalization. His amiodarone and Coumadin were continued.  The patient was admitted again on 03/19/17 for hypotension and bradycardia at which time his losartan and metoprolol were discontinued. He again presented to the [facility] emergency department on 03/28/17 with orthostatic hypotension with standing systolic blood pressure in the 70s. He was noted to have worsening anemia with hemoglobin of 8.6. The patient denies any active bleeding and declined a rectal occult blood test. He improved with IV fluid administration.  Today the patient is feeling very well with no further issues of lightheadedness and weakness that he had with hypotension. He denies chest  pain, shortness of breath, orthopnea, PND, palpitations or edema. He denies melena or any unusual bleeding. He does have bruising of his forearms.  The patient also had orthostatic hypotension after his AAA surgery in 2017.   Past Medical History:  Diagnosis Date  . AAA (abdominal aortic aneurysm) ([place])   . AAA (abdominal aortic aneurysm) without rupture ([place])   . Acute respiratory failure ([place]) 03/13/2017  . Anxiety   . Atrial fibrillation (HCC)     Brief episode of  . Atrial fibrillation with rapid ventricular response ([place]) 01/06/2016  . Cardiomyopathy, ischemic 10/27/2014  . CHF (congestive heart failure) ([place])   . COPD (chronic obstructive pulmonary disease) ([place])   . Coronary artery disease    Multivessel, S/p stenting to LCx. LHC 03/16/17: unchanged MVD- 50%mid LAD, 85% 1st daig, 100% prox circ, 100% prox RCA  . Depression   . Dyslipidemia   . Headache   . Hyperlipidemia   . Hypertension   . Myocardial infarction ([place])   . Peripheral vascular disease ([place])   . 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 Shortness of breath dyspnea   . Stroke ([place])   . Tobacco dependence   . Ventricular dysfunction    Mild Left    Past Surgical History:  Procedure Laterality Date  . ABDOMINAL AORTIC ANEURYSM REPAIR  01/03/2016   Procedure: ANEURYSM ABDOMINAL AORTIC REPAIR USING 14MM X 8MM X40CM HEMASHIELD GOLD GRAFT;  Surgeon: Conrad Freedom, MD;  Location: [place];  Service: Vascular;;  . ABDOMINAL AORTIC ENDOVASCULAR STENT GRAFT N/A 01/03/2016   Procedure: ABDOMINAL AORTIC ENDOVASCULAR STENT GRAFT IMPLANTED/EXPLANTED;  Surgeon: Conrad Barneveld, MD;  Location: [facility]  OR;  Service: Vascular;  Laterality: N/A;  . BACK SURGERY     cervical  . HAND SURGERY Left    thumb surgery  . HERNIA REPAIR Right   . LEFT HEART CATH AND CORONARY ANGIOGRAPHY N/A 03/16/2017   Procedure: Left Heart Cath and Coronary Angiography;  Surgeon: Martinique, Peter M, MD;  Location: [facility];  Service: Cardiovascular;  Laterality:  N/A;  . LEFT HEART CATHETERIZATION WITH CORONARY ANGIOGRAM N/A 10/27/2014   Procedure: LEFT HEART CATHETERIZATION WITH CORONARY ANGIOGRAM;  Surgeon: Blane Ohara, MD;  Location: [facility] CATH LAB;  Service: Cardiovascular;  Laterality: N/A;  . NECK SURGERY    . stent     pt unsure of location  . US ECHOCARDIOGRAPHY  12-26-2008   EF 40-45%  . WRIST SURGERY Right     Current Medications: Current Meds  Medication Sig  . albuterol (PROAIR HFA) 108 (90 Base) MCG/ACT inhaler Inhale 1-2 puffs into the lungs every 6 (six) hours as needed for wheezing or shortness of breath.   [name] amiodarone (PACERONE) 200 MG tablet Take 1 tablet (200 mg total) by mouth daily.  [name] aspirin EC 81 MG tablet Take 81 mg by mouth daily.  [name] atorvastatin (LIPITOR) 80 MG tablet Take 1 tablet (80 mg total) by mouth daily at 6 PM.  . BREO ELLIPTA 100-25 MCG/INH AEPB Inhale 1 puff into the lungs daily.  . budesonide-formoterol (SYMBICORT) 160-4.5 MCG/ACT inhaler Inhale 2 puffs into the lungs 2 (two) times daily.  [name] enoxaparin (LOVENOX) 100 MG/ML injection Inject 0.9 mLs (90 mg total) into the skin daily.  [name] escitalopram (LEXAPRO) 20 MG tablet Take 20 mg by mouth daily.  . folic acid (FOLVITE) 1 MG tablet Place 1 tablet (1 mg total) into feeding tube daily.  . isosorbide mononitrate (IMDUR) 30 MG 24 hr tablet Take 0.5 tablets (15 mg total) by mouth daily.  . mirtazapine (REMERON SOL-TAB) 30 MG disintegrating tablet Take 30 mg by mouth at bedtime.   . mometasone (NASONEX) 50 MCG/ACT nasal spray Place 2 sprays into the nose daily as needed (for sinus).   . nitroGLYCERIN (NITROSTAT) 0.4 MG SL tablet Place 0.4 mg under the tongue every 5 (five) minutes as needed for chest pain.   [name] oxyCODONE (OXY IR/ROXICODONE) 5 MG immediate release tablet Take 15 mg by mouth every 4 (four) hours. For chronic post op pain  . pantoprazole (PROTONIX) 40 MG tablet Take 1 tablet (40 mg total) by mouth daily.  [name] thiamine 100 MG tablet Take 1 tablet (100 mg  total) by mouth daily.  [name] tiotropium (SPIRIVA HANDIHALER) 18 MCG inhalation capsule Place 18 mcg into inhaler and inhale daily.  [name] tiZANidine (ZANAFLEX) 4 MG tablet Take 4-8 mg by mouth See admin instructions. Take 8 mg every morning and take 4 mg at bedtime  . warfarin (COUMADIN) 2.5 MG tablet 2.5 mg -T,TH ,SAT 5 MG m-w-f-sun (Patient taking differently: Take 2.5-5 mg by mouth See admin instructions. Take 2.5 mg by mouth daily on Tuesday, Thursday and Saturday. Take 5 mg by mouth daily on all other days)     Allergies:   Aspirin; Ibuprofen; Aspirin; and Ibuprofen   Social History   Social History  . Marital status: Widowed    Spouse name: N/A  . Number of children: 1  . Years of education: N/A   Social History Main Topics  . Smoking status: Current Every Day Smoker    Packs/day: 0.50    Years: 20.00  Types: Cigarettes  . Smokeless tobacco: Never Used  . Alcohol use No  . Drug use: No  . Sexual activity: Not Asked   Other Topics Concern  . None   Social History Narrative   ** Merged History Encounter **         Family History: The patient's family history includes AAA (abdominal aortic aneurysm) in his mother; Cancer in his father. ROS:   Please see the history of present illness.     All other systems reviewed and are negative.  EKGs/Labs/Other Studies Reviewed:    The following studies were reviewed today:  Left Heart Cath and Coronary Angiography 03/16/17  Conclusion    Mid LAD lesion, 50 %stenosed.  1st Diag lesion, 85 %stenosed.  Ost Cx to Prox Cx lesion, 100 %stenosed.  Ost RCA to Mid RCA lesion, 100 %stenosed.  LV end diastolic pressure is normal.  There is severe left ventricular systolic dysfunction.  The left ventricular ejection fraction is 25-35% by visual estimate.   1. 3 vessel obstructive CAD    - 50% mid LAD, 85 % first diagonal    - 100% proximal LCx    -100% proximal RCA 2. Severe LV dysfunction 3. Normal LVEDP  Plan: study  compared to prior cath films in February 2016 and there is no significant change. He has known ischemic cardiomyopathy. EDP is only 5 mm Hg so we will stop his IV lasix. He has done poorly with prior stents and has no active anginal symptoms. I suspect his elevated troponin is due to demand ischemia in setting of acute respiratory failure/ COPD. I would recommend aggressive medical therapy. I would not recommend PCI of the diagonal unless he has significant angina.   Echocardiogram 03/10/17 Study Conclusions - Left ventricle: The cavity size was moderately dilated. There was   moderate concentric hypertrophy. Systolic function was mildly to   moderately reduced. The estimated ejection fraction was in the   range of 40% to 45%. Severe hypokinesis of the lateral and   inferior myocardium. Diastolic dysfunction with indeterminate LV   filling pressure. - Mitral valve: Mildly thickened leaflets . There was moderate   regurgitation. - Left atrium: Severely dilated. - Right atrium: The atrium was mildly dilated. - Inferior vena cava: The vessel was dilated. The respirophasic   diameter changes were blunted (< 50%), consistent with elevated   central venous pressure. (On ventilator)  Impressions: - LVEF 50-55%, moderately dilated ventricle with normal wall   thickness, diastolic dysfunction and indeterminate LV filling   pressure, moderate MR, severe LAE, mild RAE, dilated IVC (on   ventilator)   EKG:  EKG is  ordered today.  The ekg ordered today demonstrates sinus rhythm at 67 bpm  Recent Labs: 03/12/2017: Magnesium 2.2 03/15/2017: B Natriuretic Peptide 793.5 03/19/2017: TSH 0.715 03/20/2017: ALT 23 03/28/2017: BUN 16; Creatinine, Ser 1.25; Hemoglobin 8.6; Platelets 173; Potassium 5.2; Sodium 136   Recent Lipid Panel    Component Value Date/Time   CHOL 77 10/15/2014 0530   TRIG 44 03/11/2017 0439   HDL 24 (L) 10/15/2014 0530   CHOLHDL 3.2 10/15/2014 0530   VLDL 11 10/15/2014 0530    LDLCALC 42 10/15/2014 0530    Physical Exam:    VS:  BP 122/78   Pulse 67   Ht 5\' 6"  (1.676 m)   Wt 128 lb (58.1 kg)   SpO2 92%   BMI 20.66 kg/m     Orthostatic VS for the past 24 hrs:  BP-  Lying Pulse- Lying BP- Sitting Pulse- Sitting BP- Standing at 0 minutes Pulse- Standing at 0 minutes  04/09/17 1349 138/81 66 130/81 69 120/71 74   Wt Readings from Last 3 Encounters:  04/09/17 128 lb (58.1 kg)  03/28/17 134 lb (60.8 kg)  03/19/17 123 lb 8 oz (56 kg)     Physical Exam  Constitutional: He is oriented to person, place, and time. No distress.  Thin Caucasian male  HENT:  Head: Normocephalic and atraumatic.  Neck: Normal range of motion. Neck supple. JVD present. Carotid bruit is not present.  1 cm JVD at 45  Cardiovascular: Normal rate, regular rhythm and normal heart sounds.  Exam reveals no gallop and no friction rub.   No murmur heard. Pulmonary/Chest: Effort normal and breath sounds normal. No respiratory distress. He has no wheezes. He has no rales. He exhibits no tenderness.  Abdominal: Soft. Bowel sounds are normal. He exhibits no distension. There is no tenderness.  Musculoskeletal: Normal range of motion. He exhibits no edema or deformity.  Neurological: He is alert and oriented to person, place, and time.  Skin: Skin is warm and dry.  Psychiatric: He has a normal mood and affect. His behavior is normal.     ASSESSMENT:    1. Heart failure, type unknown ([place])    PLAN:    In order of problems listed above:  1. Orthostatic hypotension: Patient is feeling much better without lightheadedness or weakness. He had orthostatic hypotension following medication changes after recent hospitalization for respiratory failure/COPD at which time he had mildly elevated troponins and a cardiac catheterization revealed severe multivessel disease unchanged. He was started on low-dose Imdur. He then developed orthostatic hypotension and bradycardia and his beta blocker and  losartan were discontinued. He continues low-dose Imdur. He is feeling much better and orthostatic vital signs are normal today. I'm not inclined to change his medications at this time as he is feeling very well. Will have patient follow-up with Dr. [name] in a month to reevaluate.  2. CAD: As above a cardiac catheterization on 03/16/17 showed unchanged chronic total occlusion of the RCA and significant disease of the left circumflex with extensive collaterals. Since the patient has had no angina, no intervention was done. The patient remains chest pain-free. He is currently on low-dose Imdur. Beta blocker was discontinued due to bradycardia and hypotension. Losartan was discontinued due to hypotension. Continue aspirin and statin.   3. Paroxysmal atrial fibrillation:  Maintaining sinus rhythm on amiodarone. Beta blocker has been discontinued due to bradycardia and hypotension. CHA2DS2/VAS Stroke Risk Score is 4 (CHF, vascular dz, HTN, age). Warfarin is noted on the patient's medication list however his INR was not therapeutic at his recent hospitalization. The patient does not recall ever going in for blood work related to warfarin. Nor does he recall being told not to take warfarin. The patient pharmacy does not have any record of ever dispensing warfarin. After the appointment I called the patient at home and he does not have any prescription bottles with warfarin. I will obtain CBC today and BMet for kidney function and discuss the need for anticoagulation with Dr.Nahser tomorrow.       4. Chronic combined systolic and diastolic heart failure: EF 40-45%. EDP was only 5 mm at cath on 03/16/17. Patient is not on a diuretic. He appears euvolemic without shortness of breath, orthopnea or edema.  5. Chronic Anemia: Recent hemoglobin of 8.6 on 03/28/17 in the setting of orthostatic hypotension, no obvious blood loss.  Patient is asymptomatic. Will recheck CBC today.  6.  tobacco use: The patient continues to smoke  less than a pack a day. We discussed smoking cessation and identified some ways for him to cut down to hopefully complete cessation. However, the patient does not seem to have much confidence that he will quit.  Medication Adjustments/Labs and Tests Ordered: Current medicines are reviewed at length with the patient today.  Concerns regarding medicines are outlined above. Labs and tests ordered and medication changes are outlined in the patient instructions below:  Patient Instructions  Medication Instructions:   Your physician recommends that you continue on your current medications as directed. Please refer to the Current Medication list given to you today.   If you need a refill on your cardiac medications before your next appointment, please call your pharmacy.  Labwork:  CBC  AND BMET     Testing/Procedures: NONE ORDERED  TODAY    Follow-Up: WITH DR NAHSER IN 1 TO 2 MONTHS   Any Other Special Instructions Will Be Listed Below (If Applicable).                                                                                                                                                      Signed, Daune Perch, NP  04/09/2017 3:57 PM    [facility]

## 2017-04-09 ENCOUNTER — Encounter: Payer: Self-pay | Admitting: Physician Assistant

## 2017-04-09 ENCOUNTER — Ambulatory Visit (INDEPENDENT_AMBULATORY_CARE_PROVIDER_SITE_OTHER): Payer: Medicare Other | Admitting: Cardiology

## 2017-04-09 VITALS — BP 122/78 | HR 67 | Ht 66.0 in | Wt 128.0 lb

## 2017-04-09 DIAGNOSIS — I509 Heart failure, unspecified: Secondary | ICD-10-CM | POA: Diagnosis not present

## 2017-04-09 DIAGNOSIS — D649 Anemia, unspecified: Secondary | ICD-10-CM | POA: Diagnosis not present

## 2017-04-09 DIAGNOSIS — I2510 Atherosclerotic heart disease of native coronary artery without angina pectoris: Secondary | ICD-10-CM

## 2017-04-09 DIAGNOSIS — F17210 Nicotine dependence, cigarettes, uncomplicated: Secondary | ICD-10-CM | POA: Diagnosis not present

## 2017-04-09 DIAGNOSIS — I951 Orthostatic hypotension: Secondary | ICD-10-CM | POA: Diagnosis not present

## 2017-04-09 DIAGNOSIS — I480 Paroxysmal atrial fibrillation: Secondary | ICD-10-CM | POA: Diagnosis not present

## 2017-04-09 DIAGNOSIS — I5042 Chronic combined systolic (congestive) and diastolic (congestive) heart failure: Secondary | ICD-10-CM | POA: Diagnosis not present

## 2017-04-09 DIAGNOSIS — I248 Other forms of acute ischemic heart disease: Secondary | ICD-10-CM

## 2017-04-09 NOTE — Patient Instructions (Addendum)
Medication Instructions:   Your physician recommends that you continue on your current medications as directed. Please refer to the Current Medication list given to you today.   If you need a refill on your cardiac medications before your next appointment, please call your pharmacy.  Labwork:  CBC  AND BMET     Testing/Procedures: NONE ORDERED  TODAY    Follow-Up: WITH DR NAHSER IN 1 TO 2 MONTHS   Any Other Special Instructions Will Be Listed Below (If Applicable).

## 2017-04-10 ENCOUNTER — Telehealth: Payer: Self-pay | Admitting: Cardiology

## 2017-04-10 ENCOUNTER — Other Ambulatory Visit: Payer: Self-pay | Admitting: Cardiology

## 2017-04-10 DIAGNOSIS — Z79899 Other long term (current) drug therapy: Secondary | ICD-10-CM

## 2017-04-10 LAB — CBC
HEMATOCRIT: 31.6 % — AB (ref 37.5–51.0)
HEMOGLOBIN: 9.2 g/dL — AB (ref 13.0–17.7)
MCH: 24.1 pg — ABNORMAL LOW (ref 26.6–33.0)
MCHC: 29.1 g/dL — ABNORMAL LOW (ref 31.5–35.7)
MCV: 83 fL (ref 79–97)
Platelets: 437 10*3/uL — ABNORMAL HIGH (ref 150–379)
RBC: 3.82 x10E6/uL — AB (ref 4.14–5.80)
RDW: 17.6 % — ABNORMAL HIGH (ref 12.3–15.4)
WBC: 4.3 10*3/uL (ref 3.4–10.8)

## 2017-04-10 LAB — BASIC METABOLIC PANEL
BUN / CREAT RATIO: 12 (ref 10–24)
BUN: 11 mg/dL (ref 8–27)
CALCIUM: 8.8 mg/dL (ref 8.6–10.2)
CO2: 27 mmol/L (ref 20–29)
CREATININE: 0.89 mg/dL (ref 0.76–1.27)
Chloride: 102 mmol/L (ref 96–106)
GFR calc Af Amer: 101 mL/min/{1.73_m2} (ref >59)
GFR calc non Af Amer: 87 mL/min/{1.73_m2} (ref >59)
GLUCOSE: 94 mg/dL (ref 65–99)
Potassium: 4.3 mmol/L (ref 3.5–5.2)
Sodium: 141 mmol/L (ref 134–144)

## 2017-04-10 MED ORDER — RIVAROXABAN 20 MG PO TABS: 20.0000 mg | ORAL_TABLET | Freq: Every day | ORAL | 5 refills | Status: DC

## 2017-04-10 NOTE — Telephone Encounter (Signed)
I have reviewed this patient with Dr. [name] regarding his PAF and the fact that he is not currently anticoagulated. His HGB yesterday was 9.2, which is stable for him. His kidney function is normal. We will initiate Xarelto 20 mg daily and repeat CBC and Bmet in one month. I have called and spoken to the patient, explaining the plan for anticoagulation with Xarelto for stroke risk reduction related to atrial fibrillation and he is in agreement. I discussed his chronic anemia. He does not know if this has been worked up in the past. He has an appt in a few days with his PCP and will discuss this.  I have advised him on reporting any usual bleeding including black or tarry stools. He verbalizes understanding of all discussion.   Daune Perch, AGNP-C 04/10/2017  12:08 PM

## 2017-04-14 ENCOUNTER — Other Ambulatory Visit: Payer: Self-pay | Admitting: Cardiovascular Disease

## 2017-04-14 ENCOUNTER — Encounter: Payer: Self-pay | Admitting: Vascular Surgery

## 2017-04-14 NOTE — Progress Notes (Signed)
   03/25/17 1300  SLP G-Codes **NOT FOR INPATIENT CLASS**  Functional Assessment Tool Used clinical judgement  Functional Limitations Swallowing  Swallow Current Status (F4239) CJ  Swallow Goal Status (R3202) CJ  Swallow Discharge Status (B3435) CJ  SLP Evaluations  $ SLP Speech Visit 1 Procedure  SLP Evaluations  $FEES Swallow 1 Procedure  $Swallowing Treatment 1 Procedure

## 2017-04-15 ENCOUNTER — Telehealth: Payer: Self-pay | Admitting: *Deleted

## 2017-04-15 NOTE — Telephone Encounter (Signed)
Called pt, per Daniel Citizen, PA-C, to let pt know that he needed to come back for repeat bmet & cbc 05/08/17. Pt agreeable with the plan and thanked me for my call.

## 2017-04-15 NOTE — Addendum Note (Signed)
Addended by: Gaetano Net on: 04/15/2017 08:05 AM   Modules accepted: Orders

## 2017-04-15 NOTE — Telephone Encounter (Signed)
-----   Message from Daune Perch, NP sent at 04/10/2017 12:13 PM EDT ----- Regarding: Labs Can you please arrange for this pt to have a CBC and BMet in one month.   Thanks Daune Perch, AGNP-C 04/10/2017  12:14 PM

## 2017-04-16 NOTE — Progress Notes (Signed)
Established Open AAA Repair   History of Present Illness   [name] is a 71 y.o. (June 11, 1946) male who presents for routine follow up s/p EVAR converted to ABF (Date: 01/04/16).  Procedure was done for large AAA, large R CIA, and moderate L CIA.  Most recent aortoiliac duplex (Date: 04/22/2017) demonstrates: widely patent ABF.  The patient has no had back or abdominal pain.  He does note some abd wall hernia which developed slowly over the last year.  The patient's PMH, PSH, and SH, and FamHx are unchanged from 01/24/16.   Current Outpatient Prescriptions  Medication Sig Dispense Refill  . albuterol (PROAIR HFA) 108 (90 Base) MCG/ACT inhaler Inhale 1-2 puffs into the lungs every 6 (six) hours as needed for wheezing or shortness of breath.     [name] amiodarone (PACERONE) 200 MG tablet TAKE 1 TABLET BY MOUTH ONCE DAILY 90 tablet 3  . aspirin EC 81 MG tablet Take 81 mg by mouth daily.    [name] atorvastatin (LIPITOR) 80 MG tablet Take 1 tablet (80 mg total) by mouth daily at 6 PM. 30 tablet 5  . BREO ELLIPTA 100-25 MCG/INH AEPB Inhale 1 puff into the lungs daily.  1  . budesonide-formoterol (SYMBICORT) 160-4.5 MCG/ACT inhaler Inhale 2 puffs into the lungs 2 (two) times daily. 1 Inhaler 12  . escitalopram (LEXAPRO) 20 MG tablet Take 20 mg by mouth daily.    . folic acid (FOLVITE) 1 MG tablet Place 1 tablet (1 mg total) into feeding tube daily. 30 tablet 1  . isosorbide mononitrate (IMDUR) 30 MG 24 hr tablet Take 0.5 tablets (15 mg total) by mouth daily. 30 tablet 2  . mirtazapine (REMERON SOL-TAB) 30 MG disintegrating tablet Take 30 mg by mouth at bedtime.     . mometasone (NASONEX) 50 MCG/ACT nasal spray Place 2 sprays into the nose daily as needed (for sinus).     . nitroGLYCERIN (NITROSTAT) 0.4 MG SL tablet Place 0.4 mg under the tongue every 5 (five) minutes as needed for chest pain.     [name] oxyCODONE (OXY IR/ROXICODONE) 5 MG immediate release tablet Take 15 mg by mouth every 4 (four) hours.  For chronic post op pain    . pantoprazole (PROTONIX) 40 MG tablet Take 1 tablet (40 mg total) by mouth daily. 30 tablet 1  . rivaroxaban (XARELTO) 20 MG TABS tablet Take 1 tablet (20 mg total) by mouth daily with supper. 30 tablet 5  . thiamine 100 MG tablet Take 1 tablet (100 mg total) by mouth daily. 30 tablet 1  . tiotropium (SPIRIVA HANDIHALER) 18 MCG inhalation capsule Place 18 mcg into inhaler and inhale daily.    [name] tiZANidine (ZANAFLEX) 4 MG tablet Take 4-8 mg by mouth See admin instructions. Take 8 mg every morning and take 4 mg at bedtime     No current facility-administered medications for this visit.     Allergies  Allergen Reactions  . Aspirin Nausea Only and Other (See Comments)    Stomach upset  . Ibuprofen Hives  . Aspirin Hives  . Ibuprofen Hives    On ROS today: +hernia, no abd or back pain.   Physical Examination   Vitals:   04/22/17 1030  BP: 128/76  Pulse: 69  Resp: 16  Temp: 98.5 F (36.9 C)  TempSrc: Oral  SpO2: 90%  Weight: 123 lb 6.4 oz (56 kg)  Height: 5' 6.5" (1.689 m)   Body mass index is 19.62 kg/m.  General Alert,  O x 3, obvious weight loss, NAD  Pulmonary Sym exp, good B air movt, CTA B  Cardiac RRR, Nl S1, S2, no Murmurs, No rubs, No S3,S4  Vascular Vessel Right Left  Radial Palpable Palpable  Brachial Palpable Palpable  Carotid Palpable, No Bruit Palpable, No Bruit  Aorta Not palpable N/A  Femoral Palpable Palpable  Popliteal Not palpable Not palpable  PT Not palpable Palpable  DP Palpable Palpable    Gastro- intestinal soft, non-distended, non-tender to palpation, No guarding or rebound, no HSM, no masses, no CVAT B, No palpable prominent aortic pulse, moderate size supraumbilical ventral hernia    Musculo- skeletal M/S 5/5 throughout  , Extremities without ischemic changes  , No edema present, No visible varicosities , No Lipodermatosclerosis present  Neurologic Pain and light touch intact in extremities , Motor exam as listed  above    Non-Invasive Vascular Imaging   Aortoiliac Duplex (Date: 04/22/2017)  Widely patent ABF  ABI (04/22/2017)  R:   ABI: 0.98,   PT: mono  DP: tri  TBI:  1.02  L:   ABI: 1.07,   PT: tri  DP: bi  TBI: 0.83  Renovascular Duplex (04/22/2017)  R kidney size: 10.8 cm  L kidney size: 10.1 cm  R RA stenosis: <60%  L RA stenosis: <60%  R RA dissection 1.1 cm (not significantly changed)    Medical Decision Making   ELISE GLADDEN is a 71 y.o. male who presents s/p EVAR conversion to ABF for large infrarenal AAA, large R CIA aneurysm and moderate L CIA aneurysm, R RA dissection/aneurysm stable   His ABF appears widely patent.  No evidence of any proximal pseudoaneurysm.    Perfusion to legs is intact.  R RA dissection/aneurysm remains small.  Based on examination and non-invasive studies would continue with annual aortoiliac and renal duplex to monitor for any aneurysm degeneration of anastomosis.  Discussed indications for considering any ventral hernia repair.  Patient is currently asx so nothing is planned at this point.  I also remember that this patient has diastasis in his upper mid-line which might have contributed to this.  Thank you for allowing [place] to participate in this patient's care.   Adele Barthel, MD, FACS [facility] Office: [phone] Pager: [phone]

## 2017-04-17 DIAGNOSIS — Z79899 Other long term (current) drug therapy: Secondary | ICD-10-CM | POA: Diagnosis not present

## 2017-04-17 DIAGNOSIS — G8921 Chronic pain due to trauma: Secondary | ICD-10-CM | POA: Diagnosis not present

## 2017-04-20 ENCOUNTER — Other Ambulatory Visit: Payer: Self-pay

## 2017-04-20 ENCOUNTER — Other Ambulatory Visit: Payer: Self-pay | Admitting: *Deleted

## 2017-04-20 DIAGNOSIS — I714 Abdominal aortic aneurysm, without rupture, unspecified: Secondary | ICD-10-CM

## 2017-04-20 DIAGNOSIS — Z48812 Encounter for surgical aftercare following surgery on the circulatory system: Secondary | ICD-10-CM

## 2017-04-22 ENCOUNTER — Ambulatory Visit (HOSPITAL_COMMUNITY)
Admission: RE | Admit: 2017-04-22 | Discharge: 2017-04-22 | Disposition: A | Discharge status: Home / Self Care | Payer: Medicare Other | Source: Ambulatory Visit | Attending: Vascular Surgery | Admitting: Vascular Surgery

## 2017-04-22 ENCOUNTER — Encounter: Payer: Self-pay | Admitting: Vascular Surgery

## 2017-04-22 ENCOUNTER — Ambulatory Visit (INDEPENDENT_AMBULATORY_CARE_PROVIDER_SITE_OTHER)
Admission: RE | Admit: 2017-04-22 | Discharge: 2017-04-22 | Disposition: A | Discharge status: Home / Self Care | Payer: Medicare Other | Source: Ambulatory Visit | Attending: Vascular Surgery | Admitting: Vascular Surgery

## 2017-04-22 ENCOUNTER — Ambulatory Visit (INDEPENDENT_AMBULATORY_CARE_PROVIDER_SITE_OTHER): Payer: Medicare Other | Admitting: Vascular Surgery

## 2017-04-22 VITALS — BP 128/76 | HR 69 | Temp 98.5°F | Resp 16 | Ht 66.5 in | Wt 123.4 lb

## 2017-04-22 DIAGNOSIS — I714 Abdominal aortic aneurysm, without rupture, unspecified: Secondary | ICD-10-CM

## 2017-04-22 DIAGNOSIS — I248 Other forms of acute ischemic heart disease: Secondary | ICD-10-CM | POA: Diagnosis not present

## 2017-04-22 DIAGNOSIS — Z95828 Presence of other vascular implants and grafts: Secondary | ICD-10-CM | POA: Insufficient documentation

## 2017-04-22 DIAGNOSIS — I723 Aneurysm of iliac artery: Secondary | ICD-10-CM

## 2017-04-22 DIAGNOSIS — Z48812 Encounter for surgical aftercare following surgery on the circulatory system: Secondary | ICD-10-CM

## 2017-04-22 DIAGNOSIS — I7773 Dissection of renal artery: Secondary | ICD-10-CM

## 2017-04-22 DIAGNOSIS — F17200 Nicotine dependence, unspecified, uncomplicated: Secondary | ICD-10-CM | POA: Insufficient documentation

## 2017-04-27 NOTE — Addendum Note (Signed)
Addended by: Lianne Cure A on: 04/27/2017 04:10 PM   Modules accepted: Orders

## 2017-05-08 ENCOUNTER — Other Ambulatory Visit: Payer: Medicare Other | Admitting: *Deleted

## 2017-05-08 DIAGNOSIS — Z79899 Other long term (current) drug therapy: Secondary | ICD-10-CM

## 2017-05-08 LAB — CBC
HEMATOCRIT: 27.6 % — AB (ref 37.5–51.0)
Hemoglobin: 8.2 g/dL — ABNORMAL LOW (ref 13.0–17.7)
MCH: 23 pg — ABNORMAL LOW (ref 26.6–33.0)
MCHC: 29.7 g/dL — AB (ref 31.5–35.7)
MCV: 77 fL — AB (ref 79–97)
Platelets: 376 10*3/uL (ref 150–379)
RBC: 3.57 x10E6/uL — ABNORMAL LOW (ref 4.14–5.80)
RDW: 18.2 % — AB (ref 12.3–15.4)
WBC: 6.6 10*3/uL (ref 3.4–10.8)

## 2017-05-08 LAB — BASIC METABOLIC PANEL
BUN / CREAT RATIO: 20 (ref 10–24)
BUN: 16 mg/dL (ref 8–27)
CO2: 25 mmol/L (ref 20–29)
CREATININE: 0.8 mg/dL (ref 0.76–1.27)
Calcium: 8.9 mg/dL (ref 8.6–10.2)
Chloride: 102 mmol/L (ref 96–106)
GFR calc non Af Amer: 91 mL/min/{1.73_m2} (ref >59)
GFR, EST AFRICAN AMERICAN: 105 mL/min/{1.73_m2} (ref >59)
Glucose: 84 mg/dL (ref 65–99)
Potassium: 4.3 mmol/L (ref 3.5–5.2)
SODIUM: 141 mmol/L (ref 134–144)

## 2017-05-19 DIAGNOSIS — G8928 Other chronic postprocedural pain: Secondary | ICD-10-CM | POA: Diagnosis not present

## 2017-05-19 DIAGNOSIS — G8921 Chronic pain due to trauma: Secondary | ICD-10-CM | POA: Diagnosis not present

## 2017-05-19 DIAGNOSIS — G549 Nerve root and plexus disorder, unspecified: Secondary | ICD-10-CM | POA: Diagnosis not present

## 2017-05-19 DIAGNOSIS — M961 Postlaminectomy syndrome, not elsewhere classified: Secondary | ICD-10-CM | POA: Diagnosis not present

## 2017-05-19 DIAGNOSIS — R29898 Other symptoms and signs involving the musculoskeletal system: Secondary | ICD-10-CM | POA: Diagnosis not present

## 2017-06-03 ENCOUNTER — Encounter: Payer: Self-pay | Admitting: Cardiovascular Disease

## 2017-06-12 ENCOUNTER — Ambulatory Visit: Payer: Medicare Other | Admitting: Cardiovascular Disease

## 2017-06-18 DIAGNOSIS — M961 Postlaminectomy syndrome, not elsewhere classified: Secondary | ICD-10-CM | POA: Diagnosis not present

## 2017-06-18 DIAGNOSIS — G8928 Other chronic postprocedural pain: Secondary | ICD-10-CM | POA: Diagnosis not present

## 2017-06-18 DIAGNOSIS — G8921 Chronic pain due to trauma: Secondary | ICD-10-CM | POA: Diagnosis not present

## 2017-07-09 DIAGNOSIS — K4090 Unilateral inguinal hernia, without obstruction or gangrene, not specified as recurrent: Secondary | ICD-10-CM | POA: Diagnosis not present

## 2017-07-13 ENCOUNTER — Telehealth: Payer: Self-pay | Admitting: Cardiovascular Disease

## 2017-07-13 DIAGNOSIS — I481 Persistent atrial fibrillation: Secondary | ICD-10-CM | POA: Diagnosis not present

## 2017-07-13 DIAGNOSIS — J449 Chronic obstructive pulmonary disease, unspecified: Secondary | ICD-10-CM | POA: Diagnosis not present

## 2017-07-13 DIAGNOSIS — I2510 Atherosclerotic heart disease of native coronary artery without angina pectoris: Secondary | ICD-10-CM | POA: Diagnosis not present

## 2017-07-13 DIAGNOSIS — J471 Bronchiectasis with (acute) exacerbation: Secondary | ICD-10-CM | POA: Diagnosis not present

## 2017-07-13 DIAGNOSIS — F17210 Nicotine dependence, cigarettes, uncomplicated: Secondary | ICD-10-CM | POA: Diagnosis not present

## 2017-07-13 DIAGNOSIS — I714 Abdominal aortic aneurysm, without rupture: Secondary | ICD-10-CM | POA: Diagnosis not present

## 2017-07-13 DIAGNOSIS — Z9889 Other specified postprocedural states: Secondary | ICD-10-CM | POA: Diagnosis not present

## 2017-07-13 DIAGNOSIS — K430 Incisional hernia with obstruction, without gangrene: Secondary | ICD-10-CM | POA: Diagnosis not present

## 2017-07-13 DIAGNOSIS — K4090 Unilateral inguinal hernia, without obstruction or gangrene, not specified as recurrent: Secondary | ICD-10-CM | POA: Diagnosis not present

## 2017-07-13 NOTE — Telephone Encounter (Signed)
New message        [facility] Pre-operative Risk Assessment    Request for surgical clearance:  1. What type of surgery is being performed? Possible bilateral /inguinal hernia repair  2. When is this surgery scheduled? 11/23  3. Are there any medications that need to be held prior to surgery and how long?unsure  4. Practice name and name of physician performing surgery? Dr. Raul Del  5. What is your office phone and fax number? Fax [phone], phone [phone]- ask for Amber 6. Anesthesia type (None, local, MAC, general) ? general   Laurier Nancy 07/13/2017, 3:14 PM  _________________________________________________________________   (provider comments below)

## 2017-07-16 DIAGNOSIS — G8921 Chronic pain due to trauma: Secondary | ICD-10-CM | POA: Diagnosis not present

## 2017-07-16 DIAGNOSIS — K439 Ventral hernia without obstruction or gangrene: Secondary | ICD-10-CM | POA: Diagnosis not present

## 2017-07-16 DIAGNOSIS — L819 Disorder of pigmentation, unspecified: Secondary | ICD-10-CM | POA: Diagnosis not present

## 2017-07-16 DIAGNOSIS — M961 Postlaminectomy syndrome, not elsewhere classified: Secondary | ICD-10-CM | POA: Diagnosis not present

## 2017-07-16 DIAGNOSIS — G8928 Other chronic postprocedural pain: Secondary | ICD-10-CM | POA: Diagnosis not present

## 2017-07-16 NOTE — Telephone Encounter (Signed)
Spoke with pt re: cardiac clearance,  Pt has been advised that we would need to speak to him before we could clear him for his hernia sx. Pt stated, "I'm eating breakfast, can I call you back:?" I let the pt know the longer he waits, the longer his sx will be pushed out.

## 2017-07-16 NOTE — Telephone Encounter (Signed)
    Chart reviewed as part of pre-operative protocol coverage. Patient was contacted 07/16/2017 in reference to pre-operative risk assessment for pending surgery as outlined below.  [name] was last seen on 04/10/17  by Daune Perch, NP.  Since that day, KHALEEL BECKOM has done well without symptoms of myocardial ischemia or heart failure. He is feeling well and has been active.   His RCRI risk score is 2 indicating a 6.6% risk of major cardiac event with surgery. The patient has had no interval change since recent cardiac catheterization and according to guidelines can reasonably proceed with elective surgery with appropriate hemodynamic monitoring. The patient has had post op hypotension in the past after his AAA surgery. He also had orthostatic hypotension in 03/2017, but appropriate medication changes were made with no further issues. The patient will need close monitoring perioperatively.     The pt was informed of his increased risk of cardiac event with any surgery. He understands and is willing to proceed.   See attached pharmacy recommendations for anticoagulation.   Therefore, based on ACC/AHA guidelines, the patient would be at acceptable risk for the planned procedure without further cardiovascular testing.   Daune Perch, NP 07/16/2017, 5:15 PM

## 2017-07-16 NOTE — Telephone Encounter (Signed)
Patient with diagnosis of atrial fibrillation on Xarelto for anticoagulation.    Procedure: hernia repair Date of procedure: 07/31/17  CHADS2 score of 2 (CHF, HTN);  CHADS2-VASc score of  4 (CHF, HTN, AGE, CAD)  CrCl 69 Platelet count 376  Per office protocol, patient can hold Xarelto for 3 days prior to procedure.    IPatient should restart Xarelto on the evening of procedure or day after, at discretion of procedure MD

## 2017-07-16 NOTE — Telephone Encounter (Signed)
    Chart reviewed as part of pre-operative protocol coverage. Last seen by Daune Perch, NP 04/10/17. Called the patient who hung up & said "I am at doctor's office".   Allenmichael Mcpartlin, PA-C

## 2017-07-31 DIAGNOSIS — I252 Old myocardial infarction: Secondary | ICD-10-CM | POA: Diagnosis not present

## 2017-07-31 DIAGNOSIS — I481 Persistent atrial fibrillation: Secondary | ICD-10-CM | POA: Diagnosis present

## 2017-07-31 DIAGNOSIS — Z955 Presence of coronary angioplasty implant and graft: Secondary | ICD-10-CM | POA: Diagnosis not present

## 2017-07-31 DIAGNOSIS — Z7951 Long term (current) use of inhaled steroids: Secondary | ICD-10-CM | POA: Diagnosis not present

## 2017-07-31 DIAGNOSIS — I5020 Unspecified systolic (congestive) heart failure: Secondary | ICD-10-CM | POA: Diagnosis not present

## 2017-07-31 DIAGNOSIS — K430 Incisional hernia with obstruction, without gangrene: Secondary | ICD-10-CM | POA: Diagnosis not present

## 2017-07-31 DIAGNOSIS — Z8679 Personal history of other diseases of the circulatory system: Secondary | ICD-10-CM | POA: Diagnosis not present

## 2017-07-31 DIAGNOSIS — K432 Incisional hernia without obstruction or gangrene: Secondary | ICD-10-CM | POA: Diagnosis not present

## 2017-07-31 DIAGNOSIS — Z716 Tobacco abuse counseling: Secondary | ICD-10-CM | POA: Diagnosis not present

## 2017-07-31 DIAGNOSIS — D638 Anemia in other chronic diseases classified elsewhere: Secondary | ICD-10-CM | POA: Diagnosis not present

## 2017-07-31 DIAGNOSIS — K4091 Unilateral inguinal hernia, without obstruction or gangrene, recurrent: Secondary | ICD-10-CM | POA: Diagnosis not present

## 2017-07-31 DIAGNOSIS — I4891 Unspecified atrial fibrillation: Secondary | ICD-10-CM | POA: Diagnosis not present

## 2017-07-31 DIAGNOSIS — L02214 Cutaneous abscess of groin: Secondary | ICD-10-CM | POA: Diagnosis not present

## 2017-07-31 DIAGNOSIS — R1909 Other intra-abdominal and pelvic swelling, mass and lump: Secondary | ICD-10-CM | POA: Diagnosis not present

## 2017-07-31 DIAGNOSIS — I110 Hypertensive heart disease with heart failure: Secondary | ICD-10-CM | POA: Diagnosis not present

## 2017-07-31 DIAGNOSIS — I5022 Chronic systolic (congestive) heart failure: Secondary | ICD-10-CM | POA: Diagnosis present

## 2017-07-31 DIAGNOSIS — Z9889 Other specified postprocedural states: Secondary | ICD-10-CM | POA: Diagnosis not present

## 2017-07-31 DIAGNOSIS — E7800 Pure hypercholesterolemia, unspecified: Secondary | ICD-10-CM | POA: Diagnosis present

## 2017-07-31 DIAGNOSIS — J449 Chronic obstructive pulmonary disease, unspecified: Secondary | ICD-10-CM | POA: Diagnosis not present

## 2017-07-31 DIAGNOSIS — I2510 Atherosclerotic heart disease of native coronary artery without angina pectoris: Secondary | ICD-10-CM | POA: Diagnosis not present

## 2017-07-31 DIAGNOSIS — K660 Peritoneal adhesions (postprocedural) (postinfection): Secondary | ICD-10-CM | POA: Diagnosis not present

## 2017-07-31 DIAGNOSIS — K654 Sclerosing mesenteritis: Secondary | ICD-10-CM | POA: Diagnosis present

## 2017-07-31 DIAGNOSIS — I714 Abdominal aortic aneurysm, without rupture: Secondary | ICD-10-CM | POA: Diagnosis not present

## 2017-07-31 DIAGNOSIS — I96 Gangrene, not elsewhere classified: Secondary | ICD-10-CM | POA: Diagnosis not present

## 2017-07-31 DIAGNOSIS — R0902 Hypoxemia: Secondary | ICD-10-CM | POA: Diagnosis not present

## 2017-07-31 DIAGNOSIS — F17210 Nicotine dependence, cigarettes, uncomplicated: Secondary | ICD-10-CM | POA: Diagnosis not present

## 2017-07-31 DIAGNOSIS — Z7901 Long term (current) use of anticoagulants: Secondary | ICD-10-CM | POA: Diagnosis not present

## 2017-08-14 DIAGNOSIS — G8928 Other chronic postprocedural pain: Secondary | ICD-10-CM | POA: Diagnosis not present

## 2017-08-14 DIAGNOSIS — F17200 Nicotine dependence, unspecified, uncomplicated: Secondary | ICD-10-CM | POA: Diagnosis not present

## 2017-08-14 DIAGNOSIS — Z9889 Other specified postprocedural states: Secondary | ICD-10-CM | POA: Diagnosis not present

## 2017-08-14 DIAGNOSIS — G8929 Other chronic pain: Secondary | ICD-10-CM | POA: Diagnosis not present

## 2017-09-08 ENCOUNTER — Inpatient Hospital Stay (HOSPITAL_COMMUNITY)
Admission: AC | Admit: 2017-09-08 | Payer: Medicare Other | Source: Other Acute Inpatient Hospital | Admitting: Vascular Surgery

## 2017-09-08 ENCOUNTER — Ambulatory Visit (HOSPITAL_COMMUNITY): Payer: Medicare Other | Admitting: Certified Registered Nurse Anesthetist

## 2017-09-08 ENCOUNTER — Encounter (HOSPITAL_COMMUNITY)
Admission: RE | Disposition: A | Discharge status: Home Health | Payer: Self-pay | Source: Other Acute Inpatient Hospital | Attending: Vascular Surgery

## 2017-09-08 ENCOUNTER — Inpatient Hospital Stay (HOSPITAL_COMMUNITY)
Admission: RE | Admit: 2017-09-08 | Discharge: 2017-09-16 | DRG: 253 | Disposition: A | Discharge status: Home Health | Payer: Medicare Other | Source: Other Acute Inpatient Hospital | Attending: Vascular Surgery | Admitting: Vascular Surgery

## 2017-09-08 ENCOUNTER — Inpatient Hospital Stay (HOSPITAL_COMMUNITY): Payer: Medicare Other

## 2017-09-08 ENCOUNTER — Ambulatory Visit (HOSPITAL_COMMUNITY): Admission: RE | Admit: 2017-09-08 | Payer: Self-pay | Source: Ambulatory Visit

## 2017-09-08 ENCOUNTER — Encounter (HOSPITAL_COMMUNITY): Payer: Self-pay | Admitting: Certified Registered Nurse Anesthetist

## 2017-09-08 DIAGNOSIS — Z8673 Personal history of transient ischemic attack (TIA), and cerebral infarction without residual deficits: Secondary | ICD-10-CM

## 2017-09-08 DIAGNOSIS — R109 Unspecified abdominal pain: Secondary | ICD-10-CM | POA: Diagnosis not present

## 2017-09-08 DIAGNOSIS — Y832 Surgical operation with anastomosis, bypass or graft as the cause of abnormal reaction of the patient, or of later complication, without mention of misadventure at the time of the procedure: Secondary | ICD-10-CM | POA: Diagnosis present

## 2017-09-08 DIAGNOSIS — Z808 Family history of malignant neoplasm of other organs or systems: Secondary | ICD-10-CM | POA: Diagnosis not present

## 2017-09-08 DIAGNOSIS — Z7901 Long term (current) use of anticoagulants: Secondary | ICD-10-CM

## 2017-09-08 DIAGNOSIS — D62 Acute posthemorrhagic anemia: Secondary | ICD-10-CM | POA: Diagnosis not present

## 2017-09-08 DIAGNOSIS — I471 Supraventricular tachycardia: Secondary | ICD-10-CM | POA: Diagnosis not present

## 2017-09-08 DIAGNOSIS — I255 Ischemic cardiomyopathy: Secondary | ICD-10-CM | POA: Diagnosis not present

## 2017-09-08 DIAGNOSIS — J449 Chronic obstructive pulmonary disease, unspecified: Secondary | ICD-10-CM | POA: Diagnosis not present

## 2017-09-08 DIAGNOSIS — I714 Abdominal aortic aneurysm, without rupture: Secondary | ICD-10-CM | POA: Diagnosis present

## 2017-09-08 DIAGNOSIS — E7800 Pure hypercholesterolemia, unspecified: Secondary | ICD-10-CM | POA: Diagnosis not present

## 2017-09-08 DIAGNOSIS — I252 Old myocardial infarction: Secondary | ICD-10-CM

## 2017-09-08 DIAGNOSIS — F17210 Nicotine dependence, cigarettes, uncomplicated: Secondary | ICD-10-CM | POA: Diagnosis present

## 2017-09-08 DIAGNOSIS — T82898A Other specified complication of vascular prosthetic devices, implants and grafts, initial encounter: Secondary | ICD-10-CM | POA: Diagnosis not present

## 2017-09-08 DIAGNOSIS — Z452 Encounter for adjustment and management of vascular access device: Secondary | ICD-10-CM

## 2017-09-08 DIAGNOSIS — D649 Anemia, unspecified: Secondary | ICD-10-CM | POA: Diagnosis not present

## 2017-09-08 DIAGNOSIS — I509 Heart failure, unspecified: Secondary | ICD-10-CM | POA: Diagnosis not present

## 2017-09-08 DIAGNOSIS — Z9889 Other specified postprocedural states: Secondary | ICD-10-CM | POA: Diagnosis not present

## 2017-09-08 DIAGNOSIS — I9581 Postprocedural hypotension: Secondary | ICD-10-CM | POA: Diagnosis not present

## 2017-09-08 DIAGNOSIS — T827XXA Infection and inflammatory reaction due to other cardiac and vascular devices, implants and grafts, initial encounter: Secondary | ICD-10-CM | POA: Diagnosis not present

## 2017-09-08 DIAGNOSIS — K8020 Calculus of gallbladder without cholecystitis without obstruction: Secondary | ICD-10-CM | POA: Diagnosis not present

## 2017-09-08 DIAGNOSIS — I2510 Atherosclerotic heart disease of native coronary artery without angina pectoris: Secondary | ICD-10-CM | POA: Diagnosis not present

## 2017-09-08 DIAGNOSIS — I739 Peripheral vascular disease, unspecified: Secondary | ICD-10-CM | POA: Diagnosis not present

## 2017-09-08 DIAGNOSIS — E785 Hyperlipidemia, unspecified: Secondary | ICD-10-CM | POA: Diagnosis not present

## 2017-09-08 DIAGNOSIS — I481 Persistent atrial fibrillation: Secondary | ICD-10-CM | POA: Diagnosis not present

## 2017-09-08 DIAGNOSIS — Z955 Presence of coronary angioplasty implant and graft: Secondary | ICD-10-CM

## 2017-09-08 DIAGNOSIS — I110 Hypertensive heart disease with heart failure: Secondary | ICD-10-CM | POA: Diagnosis present

## 2017-09-08 DIAGNOSIS — L089 Local infection of the skin and subcutaneous tissue, unspecified: Secondary | ICD-10-CM

## 2017-09-08 DIAGNOSIS — I724 Aneurysm of artery of lower extremity: Secondary | ICD-10-CM | POA: Diagnosis present

## 2017-09-08 DIAGNOSIS — I5022 Chronic systolic (congestive) heart failure: Secondary | ICD-10-CM | POA: Diagnosis not present

## 2017-09-08 DIAGNOSIS — T148XXA Other injury of unspecified body region, initial encounter: Secondary | ICD-10-CM

## 2017-09-08 DIAGNOSIS — I5020 Unspecified systolic (congestive) heart failure: Secondary | ICD-10-CM | POA: Diagnosis not present

## 2017-09-08 DIAGNOSIS — T827XXD Infection and inflammatory reaction due to other cardiac and vascular devices, implants and grafts, subsequent encounter: Secondary | ICD-10-CM | POA: Diagnosis not present

## 2017-09-08 DIAGNOSIS — R1030 Lower abdominal pain, unspecified: Secondary | ICD-10-CM | POA: Diagnosis not present

## 2017-09-08 DIAGNOSIS — Z886 Allergy status to analgesic agent status: Secondary | ICD-10-CM

## 2017-09-08 DIAGNOSIS — IMO0002 Reserved for concepts with insufficient information to code with codable children: Secondary | ICD-10-CM | POA: Diagnosis present

## 2017-09-08 DIAGNOSIS — R0989 Other specified symptoms and signs involving the circulatory and respiratory systems: Secondary | ICD-10-CM | POA: Diagnosis not present

## 2017-09-08 DIAGNOSIS — K838 Other specified diseases of biliary tract: Secondary | ICD-10-CM | POA: Diagnosis not present

## 2017-09-08 HISTORY — PX: AXILLARY-FEMORAL BYPASS GRAFT: SHX894

## 2017-09-08 HISTORY — PX: FALSE ANEURYSM REPAIR: SHX5152

## 2017-09-08 LAB — PREPARE RBC (CROSSMATCH)

## 2017-09-08 LAB — POCT I-STAT 4, (NA,K, GLUC, HGB,HCT)
Glucose, Bld: 165 mg/dL — ABNORMAL HIGH (ref 65–99)
HCT: 24 % — ABNORMAL LOW (ref 39.0–52.0)
HEMOGLOBIN: 8.2 g/dL — AB (ref 13.0–17.0)
Potassium: 4.1 mmol/L (ref 3.5–5.1)
Sodium: 136 mmol/L (ref 135–145)

## 2017-09-08 LAB — POCT I-STAT 7, (LYTES, BLD GAS, ICA,H+H)
ACID-BASE EXCESS: 1 mmol/L (ref 0.0–2.0)
BICARBONATE: 24.4 mmol/L (ref 20.0–28.0)
CALCIUM ION: 1.22 mmol/L (ref 1.15–1.40)
HCT: 28 % — ABNORMAL LOW (ref 39.0–52.0)
Hemoglobin: 9.5 g/dL — ABNORMAL LOW (ref 13.0–17.0)
O2 SAT: 90 %
PH ART: 7.452 — AB (ref 7.350–7.450)
PO2 ART: 56 mmHg — AB (ref 83.0–108.0)
Patient temperature: 37
Potassium: 4 mmol/L (ref 3.5–5.1)
Sodium: 138 mmol/L (ref 135–145)
TCO2: 25 mmol/L (ref 22–32)
pCO2 arterial: 35 mmHg (ref 32.0–48.0)

## 2017-09-08 LAB — CBC
HCT: 27.9 % — ABNORMAL LOW (ref 39.0–52.0)
Hemoglobin: 8.2 g/dL — ABNORMAL LOW (ref 13.0–17.0)
MCH: 23 pg — AB (ref 26.0–34.0)
MCHC: 29.4 g/dL — ABNORMAL LOW (ref 30.0–36.0)
MCV: 78.2 fL (ref 78.0–100.0)
PLATELETS: 356 10*3/uL (ref 150–400)
RBC: 3.57 MIL/uL — ABNORMAL LOW (ref 4.22–5.81)
RDW: 20.5 % — ABNORMAL HIGH (ref 11.5–15.5)
WBC: 7.8 10*3/uL (ref 4.0–10.5)

## 2017-09-08 SURGERY — REPAIR, PSEUDOANEURYSM
Anesthesia: General | Site: Groin | Laterality: Bilateral

## 2017-09-08 MED ORDER — HEPARIN SODIUM (PORCINE) 5000 UNIT/ML IJ SOLN
INTRAMUSCULAR | Status: DC | PRN
  Administered 2017-09-08: 17:00:00

## 2017-09-08 MED ORDER — LACTATED RINGERS IV SOLN
INTRAVENOUS | Status: DC | PRN
  Administered 2017-09-08: 17:00:00 via INTRAVENOUS

## 2017-09-08 MED ORDER — SUGAMMADEX SODIUM 200 MG/2ML IV SOLN
INTRAVENOUS | Status: DC | PRN
  Administered 2017-09-08: 200 mg via INTRAVENOUS

## 2017-09-08 MED ORDER — LIDOCAINE HCL (CARDIAC) 20 MG/ML IV SOLN
INTRAVENOUS | Status: DC | PRN
  Administered 2017-09-08: 70 mg via INTRAVENOUS

## 2017-09-08 MED ORDER — VANCOMYCIN HCL IN DEXTROSE 1-5 GM/200ML-% IV SOLN
INTRAVENOUS | Status: AC
  Filled 2017-09-08: qty 200

## 2017-09-08 MED ORDER — PIPERACILLIN-TAZOBACTAM 3.375 G IVPB 30 MIN
3.3750 g | INTRAVENOUS | Status: AC
  Administered 2017-09-08: 3.375 g via INTRAVENOUS
  Filled 2017-09-08: qty 50

## 2017-09-08 MED ORDER — ONDANSETRON HCL 4 MG/2ML IJ SOLN
INTRAMUSCULAR | Status: DC | PRN
  Administered 2017-09-08: 4 mg via INTRAVENOUS

## 2017-09-08 MED ORDER — HEPARIN SODIUM (PORCINE) 1000 UNIT/ML IJ SOLN
INTRAMUSCULAR | Status: DC | PRN
  Administered 2017-09-08 (×2): 4000 [IU] via INTRAVENOUS
  Administered 2017-09-08: 7000 [IU] via INTRAVENOUS

## 2017-09-08 MED ORDER — ALBUMIN HUMAN 5 % IV SOLN
INTRAVENOUS | Status: DC | PRN
  Administered 2017-09-08: 21:00:00 via INTRAVENOUS

## 2017-09-08 MED ORDER — LABETALOL HCL 5 MG/ML IV SOLN
INTRAVENOUS | Status: DC | PRN
  Administered 2017-09-08: 5 mg via INTRAVENOUS

## 2017-09-08 MED ORDER — FENTANYL CITRATE (PF) 250 MCG/5ML IJ SOLN
INTRAMUSCULAR | Status: AC
  Filled 2017-09-08: qty 5

## 2017-09-08 MED ORDER — PHENYLEPHRINE HCL 10 MG/ML IJ SOLN
INTRAMUSCULAR | Status: DC | PRN
  Administered 2017-09-08: 160 ug via INTRAVENOUS

## 2017-09-08 MED ORDER — PROPOFOL 10 MG/ML IV BOLUS
INTRAVENOUS | Status: DC | PRN
  Administered 2017-09-08: 140 mg via INTRAVENOUS

## 2017-09-08 MED ORDER — DEXAMETHASONE SODIUM PHOSPHATE 10 MG/ML IJ SOLN
INTRAMUSCULAR | Status: DC | PRN
  Administered 2017-09-08: 5 mg via INTRAVENOUS

## 2017-09-08 MED ORDER — ROCURONIUM BROMIDE 100 MG/10ML IV SOLN
INTRAVENOUS | Status: DC | PRN
  Administered 2017-09-08: 80 mg via INTRAVENOUS
  Administered 2017-09-08: 10 mg via INTRAVENOUS

## 2017-09-08 MED ORDER — PROTAMINE SULFATE 10 MG/ML IV SOLN
INTRAVENOUS | Status: DC | PRN
  Administered 2017-09-08: 50 mg via INTRAVENOUS

## 2017-09-08 MED ORDER — DEXAMETHASONE SODIUM PHOSPHATE 10 MG/ML IJ SOLN
INTRAMUSCULAR | Status: AC
  Filled 2017-09-08: qty 1

## 2017-09-08 MED ORDER — 0.9 % SODIUM CHLORIDE (POUR BTL) OPTIME
TOPICAL | Status: DC | PRN
  Administered 2017-09-08: 2000 mL

## 2017-09-08 MED ORDER — FENTANYL CITRATE (PF) 100 MCG/2ML IJ SOLN
INTRAMUSCULAR | Status: DC | PRN
  Administered 2017-09-08 (×3): 50 ug via INTRAVENOUS
  Administered 2017-09-08: 100 ug via INTRAVENOUS
  Administered 2017-09-08 (×3): 50 ug via INTRAVENOUS

## 2017-09-08 MED ORDER — LACTATED RINGERS IV SOLN
INTRAVENOUS | Status: DC | PRN
  Administered 2017-09-08 (×2): via INTRAVENOUS

## 2017-09-08 MED ORDER — VANCOMYCIN HCL 1000 MG IV SOLR
INTRAVENOUS | Status: DC | PRN
  Administered 2017-09-08: 1000 mg via INTRAVENOUS

## 2017-09-08 MED ORDER — CEFUROXIME SODIUM 1.5 G IV SOLR
INTRAVENOUS | Status: AC
  Filled 2017-09-08: qty 1.5

## 2017-09-08 MED ORDER — ALBUTEROL SULFATE HFA 108 (90 BASE) MCG/ACT IN AERS
INHALATION_SPRAY | RESPIRATORY_TRACT | Status: DC | PRN
  Administered 2017-09-08: 3 via RESPIRATORY_TRACT

## 2017-09-08 MED ORDER — RIFAMPIN 600 MG IV SOLR
600.0000 mg | Freq: Once | INTRAVENOUS | Status: DC
  Filled 2017-09-08: qty 600

## 2017-09-08 MED ORDER — PHENYLEPHRINE HCL 10 MG/ML IJ SOLN
INTRAVENOUS | Status: DC | PRN
  Administered 2017-09-08: 80 ug/min via INTRAVENOUS

## 2017-09-08 MED ORDER — SODIUM CHLORIDE 0.9 % IV SOLN: Freq: Once | INTRAVENOUS | Status: DC

## 2017-09-08 MED ORDER — SODIUM CHLORIDE 0.9 % IV SOLN
INTRAVENOUS | Status: DC | PRN
  Administered 2017-09-08: 20:00:00 via INTRAVENOUS

## 2017-09-08 MED ORDER — PROMETHAZINE HCL 25 MG/ML IJ SOLN: 6.2500 mg | INTRAMUSCULAR | Status: DC | PRN

## 2017-09-08 MED ORDER — PROPOFOL 10 MG/ML IV BOLUS
INTRAVENOUS | Status: AC
  Filled 2017-09-08: qty 20

## 2017-09-08 MED ORDER — HYDROMORPHONE HCL 1 MG/ML IJ SOLN: 0.2500 mg | INTRAMUSCULAR | Status: DC | PRN

## 2017-09-08 SURGICAL SUPPLY — 72 items
ADH SKN CLS APL DERMABOND .7 (GAUZE/BANDAGES/DRESSINGS) ×1
ADH SKN CLS LQ APL DERMABOND (GAUZE/BANDAGES/DRESSINGS) ×4
BAG ISL DRAPE 18X18 STRL (DRAPES) ×1
BAG ISOLATION DRAPE 18X18 (DRAPES) IMPLANT
BANDAGE ESMARK 6X9 LF (GAUZE/BANDAGES/DRESSINGS) IMPLANT
BNDG CMPR 9X6 STRL LF SNTH (GAUZE/BANDAGES/DRESSINGS)
BNDG ESMARK 6X9 LF (GAUZE/BANDAGES/DRESSINGS)
CANISTER SUCT 3000ML PPV (MISCELLANEOUS) ×2 IMPLANT
CANNULA VESSEL 3MM 2 BLNT TIP (CANNULA) IMPLANT
CATH EMB 4FR 80CM (CATHETERS) ×1 IMPLANT
CLIP LIGATING EXTRA MED SLVR (CLIP) ×2 IMPLANT
CLIP LIGATING EXTRA SM BLUE (MISCELLANEOUS) ×2 IMPLANT
CUFF TOURNIQUET SINGLE 18IN (TOURNIQUET CUFF) IMPLANT
CUFF TOURNIQUET SINGLE 24IN (TOURNIQUET CUFF) IMPLANT
CUFF TOURNIQUET SINGLE 34IN LL (TOURNIQUET CUFF) IMPLANT
CUFF TOURNIQUET SINGLE 44IN (TOURNIQUET CUFF) IMPLANT
DERMABOND ADHESIVE PROPEN (GAUZE/BANDAGES/DRESSINGS) ×4
DERMABOND ADVANCED (GAUZE/BANDAGES/DRESSINGS) ×1
DERMABOND ADVANCED .7 DNX12 (GAUZE/BANDAGES/DRESSINGS) ×1 IMPLANT
DERMABOND ADVANCED .7 DNX6 (GAUZE/BANDAGES/DRESSINGS) IMPLANT
DRAIN CHANNEL 15F RND FF W/TCR (WOUND CARE) ×1 IMPLANT
DRAIN SNY 10X20 3/4 PERF (WOUND CARE) IMPLANT
DRAPE INCISE IOBAN 66X45 STRL (DRAPES) ×1 IMPLANT
DRAPE ISOLATION BAG 18X18 (DRAPES) ×1
DRAPE X-RAY CASS 24X20 (DRAPES) IMPLANT
DRSG COVADERM 4X6 (GAUZE/BANDAGES/DRESSINGS) ×1 IMPLANT
DRSG COVADERM 4X8 (GAUZE/BANDAGES/DRESSINGS) ×1 IMPLANT
ELECT REM PT RETURN 9FT ADLT (ELECTROSURGICAL) ×2
ELECTRODE REM PT RTRN 9FT ADLT (ELECTROSURGICAL) ×1 IMPLANT
EVACUATOR SILICONE 100CC (DRAIN) ×1 IMPLANT
GAUZE SPONGE 2X2 8PLY STRL LF (GAUZE/BANDAGES/DRESSINGS) IMPLANT
GAUZE SPONGE 4X4 12PLY STRL (GAUZE/BANDAGES/DRESSINGS) ×1 IMPLANT
GLOVE BIO SURGEON STRL SZ7 (GLOVE) ×2 IMPLANT
GLOVE BIOGEL PI IND STRL 6.5 (GLOVE) IMPLANT
GLOVE BIOGEL PI IND STRL 7.0 (GLOVE) IMPLANT
GLOVE BIOGEL PI INDICATOR 6.5 (GLOVE) ×6
GLOVE BIOGEL PI INDICATOR 7.0 (GLOVE) ×1
GLOVE ECLIPSE 7.0 STRL STRAW (GLOVE) ×1 IMPLANT
GLOVE SS BIOGEL STRL SZ 7.5 (GLOVE) ×1 IMPLANT
GLOVE SUPERSENSE BIOGEL SZ 7.5 (GLOVE) ×3
GOWN STRL REUS W/ TWL LRG LVL3 (GOWN DISPOSABLE) ×3 IMPLANT
GOWN STRL REUS W/ TWL XL LVL3 (GOWN DISPOSABLE) IMPLANT
GOWN STRL REUS W/TWL 2XL LVL3 (GOWN DISPOSABLE) ×1 IMPLANT
GOWN STRL REUS W/TWL LRG LVL3 (GOWN DISPOSABLE) ×10
GOWN STRL REUS W/TWL XL LVL3 (GOWN DISPOSABLE) ×2
GRAFT PROPATEN W/RING 8X80X70 (Vascular Products) ×2 IMPLANT
KIT BASIN OR (CUSTOM PROCEDURE TRAY) ×2 IMPLANT
KIT ROOM TURNOVER OR (KITS) ×2 IMPLANT
NS IRRIG 1000ML POUR BTL (IV SOLUTION) ×4 IMPLANT
PACK PERIPHERAL VASCULAR (CUSTOM PROCEDURE TRAY) ×2 IMPLANT
PAD ARMBOARD 7.5X6 YLW CONV (MISCELLANEOUS) ×4 IMPLANT
PADDING CAST COTTON 6X4 STRL (CAST SUPPLIES) IMPLANT
SET COLLECT BLD 21X3/4 12 (NEEDLE) IMPLANT
SPONGE GAUZE 2X2 STER 10/PKG (GAUZE/BANDAGES/DRESSINGS) ×1
SPONGE LAP 18X18 X RAY DECT (DISPOSABLE) ×2 IMPLANT
STAPLER VISISTAT 35W (STAPLE) IMPLANT
STOPCOCK 4 WAY LG BORE MALE ST (IV SETS) IMPLANT
SUT ETHILON 2 0 PSLX (SUTURE) ×2 IMPLANT
SUT ETHILON 3 0 PS 1 (SUTURE) ×1 IMPLANT
SUT PROLENE 5 0 C 1 24 (SUTURE) ×11 IMPLANT
SUT PROLENE 6 0 CC (SUTURE) ×3 IMPLANT
SUT SILK 2 0 SH (SUTURE) ×1 IMPLANT
SUT VIC AB 2-0 CTX 36 (SUTURE) ×6 IMPLANT
SUT VIC AB 3-0 SH 27 (SUTURE) ×8
SUT VIC AB 3-0 SH 27X BRD (SUTURE) ×1 IMPLANT
SYR 3ML LL SCALE MARK (SYRINGE) ×1 IMPLANT
TAPE CLOTH SURG 4X10 WHT LF (GAUZE/BANDAGES/DRESSINGS) ×1 IMPLANT
TOWEL GREEN STERILE (TOWEL DISPOSABLE) ×2 IMPLANT
TRAY FOLEY W/METER SILVER 16FR (SET/KITS/TRAYS/PACK) ×2 IMPLANT
TUBING EXTENTION W/L.L. (IV SETS) IMPLANT
UNDERPAD 30X30 (UNDERPADS AND DIAPERS) ×2 IMPLANT
WATER STERILE IRR 1000ML POUR (IV SOLUTION) ×2 IMPLANT

## 2017-09-08 NOTE — Anesthesia Preprocedure Evaluation (Addendum)
Anesthesia Evaluation  Patient identified by MRN, date of birth, ID band Patient awake    Reviewed: Allergy & Precautions, NPO status , Patient's Chart, lab work & pertinent test results  Airway Mallampati: II  TM Distance: >3 FB Neck ROM: Full    Dental no notable dental hx.    Pulmonary COPD,  COPD inhaler, Current Smoker,    Pulmonary exam normal breath sounds clear to auscultation       Cardiovascular hypertension, + CAD, + Past MI and + Peripheral Vascular Disease   Rhythm:Regular Rate:Normal + Systolic murmurs Left ventricle: The cavity size was moderately dilated. There was   moderate concentric hypertrophy. Systolic function was mildly to   moderately reduced. The estimated ejection fraction was in the   range of 40% to 45%. Severe hypokinesis of the lateral and   inferior myocardium. Diastolic dysfunction with indeterminate LV   filling pressure. - Mitral valve: Mildly thickened leaflets . There was moderate   regurgitation. - Left atrium: Severely dilated. - Right atrium: The atrium was mildly dilated. - Inferior vena cava: The vessel was dilated. The respirophasic   diameter changes were blunted (< 50%), consistent with elevated   central venous pressure. (On ventilator)  Impressions:  - LVEF 50-55%, moderately dilated ventricle with normal wall   thickness, diastolic dysfunction and indeterminate LV filling   pressure, moderate MR, severe LAE, mild RAE, dilated IVC    Neuro/Psych negative neurological ROS  negative psych ROS   GI/Hepatic negative GI ROS, Neg liver ROS,   Endo/Other  negative endocrine ROS  Renal/GU negative Renal ROS  negative genitourinary   Musculoskeletal negative musculoskeletal ROS (+)   Abdominal   Peds negative pediatric ROS (+)  Hematology Anticoagulated with xarelto   Anesthesia Other Findings   Reproductive/Obstetrics negative OB ROS                           Anesthesia Physical Anesthesia Plan  ASA: IV and emergent  Anesthesia Plan: General   Post-op Pain Management:    Induction: Intravenous  PONV Risk Score and Plan: 2 and Ondansetron and Dexamethasone  Airway Management Planned: Oral ETT  Additional Equipment:   Intra-op Plan:   Post-operative Plan: Possible Post-op intubation/ventilation  Informed Consent: I have reviewed the patients History and Physical, chart, labs and discussed the procedure including the risks, benefits and alternatives for the proposed anesthesia with the patient or authorized representative who has indicated his/her understanding and acceptance.   Dental advisory given  Plan Discussed with: CRNA and Surgeon  Anesthesia Plan Comments:        Anesthesia Quick Evaluation

## 2017-09-08 NOTE — Anesthesia Procedure Notes (Signed)
Central Venous Catheter Insertion Performed by: Myrtie Soman, MD, anesthesiologist Start/End1/09/2017 5:25 PM, 09/08/2017 5:40 PM Patient location: Pre-op. Preanesthetic checklist: patient identified, IV checked, site marked, risks and benefits discussed, surgical consent, monitors and equipment checked, pre-op evaluation, timeout performed and anesthesia consent Position: Trendelenburg Lidocaine 1% used for infiltration and patient sedated Hand hygiene performed , maximum sterile barriers used  and Seldinger technique used Catheter size: 8 Fr Total catheter length 16. Central line was placed.Double lumen Procedure performed using ultrasound guided technique. Ultrasound Notes:anatomy identified, needle tip was noted to be adjacent to the nerve/plexus identified and no ultrasound evidence of intravascular and/or intraneural injection Attempts: 1 Following insertion, dressing applied, line sutured and Biopatch. Post procedure assessment: blood return through all ports  Patient tolerated the procedure well with no immediate complications. Additional procedure comments: No picture available.

## 2017-09-08 NOTE — Transfer of Care (Signed)
Immediate Anesthesia Transfer of Care Note  Patient: [name]  Procedure(s) Performed: Bilateral Groin Exploration, Removal of Prosthetic graft. Vein Patch Closure of bilateral common femoral Arteries. (Bilateral Groin) BYPASS GRAFT AXILLA-BIFEMORAL (Bilateral Groin)  Patient Location: PACU  Anesthesia Type:General  Level of Consciousness: awake, alert  and patient cooperative  Airway & Oxygen Therapy: Patient Spontanous Breathing and Patient connected to nasal cannula oxygen  Post-op Assessment: Report given to RN and Post -op Vital signs reviewed and stable  Post vital signs: Reviewed and stable  Last Vitals: There were no vitals filed for this visit.  Last Pain: There were no vitals filed for this visit.       Complications: No apparent anesthesia complications

## 2017-09-08 NOTE — H&P (Signed)
[facility]  Patient name: [name] MRN: [id number] DOB: 09-02-1946 Sex: male   HPI: [name] is a 72 y.o. 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  He has a very complex history.  He had his 71th birthday yesterday.  He had a attempted stent graft repair of a large abdominal aortic aneurysm by Dr. Adele Olson in April 2017.  He was felt to be a high risk for open aneurysm repair.  He did not have favorable anatomy for was unable to have a stent graft and was converted to open aortobifemoral bypass at the time of his initial surgery.  He did recover from this.  He did have a ventral incisional hernia following his open surgical aneurysm repair.  On 07/31/2017 at [name] he had a laparoscopic ventral incisional hernia repair.  Also had bilateral groin exploration due to some swelling and thought that there would potentially be recurrent hernias.  He was found to have some purulence at both of these areas and this was irrigated and debrided.  He reports that yesterday he had sudden onset of severe pain in his right groin followed by increased swelling and pain throughout the night.  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  CT scan does not show any fluid around the right limb of his graft.  Past Medical History:  Diagnosis Date  . AAA (abdominal aortic aneurysm) ([place])   . AAA (abdominal aortic aneurysm) without rupture ([place])   . Acute respiratory failure ([place]) 03/13/2017  . Anxiety   . Atrial fibrillation ([facility])     Brief episode of  . Atrial fibrillation with rapid ventricular response ([place]) 01/06/2016  . Cardiomyopathy, ischemic 10/27/2014  . CHF (congestive heart failure) ([place])   . COPD (chronic obstructive pulmonary disease) ([place])   . Coronary artery  disease    Multivessel, S/p stenting to LCx. LHC 03/16/17: unchanged MVD- 50%mid LAD, 85% 1st daig, 100% prox circ, 100% prox RCA  . Depression   . Dyslipidemia   . Headache   . Hyperlipidemia   . Hypertension   . Myocardial infarction ([place])   . Peripheral vascular disease ([place])   . 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 Shortness of breath dyspnea   . Stroke ([place])   . Tobacco dependence   . Ventricular dysfunction    Mild Left    Family History  Problem Relation Age of Onset  . AAA (abdominal aortic aneurysm) Mother   . Cancer Father        bone marrow    SOCIAL HISTORY: Social History   Tobacco Use  . Smoking status: Current Every Day Smoker    Packs/day: 0.50    Years: 20.00    Pack years: 10.00    Types: Cigarettes  . Smokeless tobacco: Never Used  Substance Use Topics  . Alcohol use: No    Allergies  Allergen Reactions  . Aspirin Nausea Only and Other (See Comments)    Stomach upset  . Ibuprofen Hives  . Aspirin Hives  . Ibuprofen Hives    No current facility-administered medications for this encounter.     REVIEW OF SYSTEMS:  [X]  denotes positive finding, [ ]  denotes negative finding Cardiac  Comments:  Chest pain or chest pressure:    Shortness of breath upon  exertion:    Short of breath when lying flat:    Irregular heart rhythm:        Vascular    Pain in calf, thigh, or hip brought on by ambulation:    Pain in feet at night that wakes you up from your sleep:     Blood clot in your veins:    Leg swelling:           PHYSICAL EXAM: There were no vitals filed for this visit.  GENERAL: The patient is a well-nourished male, in no acute distress. The vital signs are documented above. CARDIOVASCULAR: 2+ radial and 2-3+ popliteal and distal pulses.  Present with some brown pus from the incision.  On the right there is erythema and a large pulsatile false aneurysm present.  Abdomen is benign and no pulsatile masses noted PULMONARY: There is good air  exchange  MUSCULOSKELETAL: There are no major deformities or cyanosis. NEUROLOGIC: No focal weakness or paresthesias are detected. SKIN: There are no ulcers or rashes noted. PSYCHIATRIC: The patient has a normal affect.  DATA:  CT scan was reviewed.  This does show a patent aortobifemoral bypass.  There is some left femoral anastomosis suggesting erythema.  On the right there is a large 5 cm false aneurysm with mural thrombus present.  Superficial femoral and profundus femoris arteries are patent.  There is some stranding around the aorta itself suggesting potential aortic involvement but no large fluid collections  MEDICAL ISSUES: I had a long discussion with the patient and his daughter present.  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  He clearly has disruption of his right femoral anastomosis with an acute infected false aneurysm.  Explained the need for emergent repair of this.  Also explained the need for exploration and debridement and potential removal of his graft.  Also explained that treatment options are limited for this very serious complication of aortic surgery.  Explained the potential for x-ray and a stomach bypass with complete graft removal and also possibility of replacement of his aortobifemoral graft with right rifampin soaked graft.  The patient refuses to consider reexploration of his abdomen.  I explained that this would be fatal if we could not control all of his graft infection.  She understands this fully and his daughter reports that it is his decision.  He is completely cognizant currently.  Explained that we would certainly need to repair his false aneurysm and could potentially replace this with rifampin soaked graft in his groins versus extra-anatomic bypass around these.  He agrees to these choices but again refuses intra-abdominal exploration even if this is life saving.  We will proceed with surgery.  He is on Xarelto and understands  potential increased risk for bleeding.  Also has known severe coronary disease and risk of coronary event with this major surgery.    Rosetta Posner, MD FACS [facility] Tel [phone] Pager [phone]

## 2017-09-08 NOTE — Anesthesia Procedure Notes (Signed)
Arterial Line Insertion Start/End1/09/2017 4:45 PM Performed by: White, Amedeo Plenty, CRNA, CRNA  Lidocaine 1% used for infiltration Left, radial was placed Catheter size: 20 G Hand hygiene performed  and maximum sterile barriers used  Allen's test indicative of satisfactory collateral circulation Attempts: 2 Procedure performed without using ultrasound guided technique. Following insertion, Biopatch and dressing applied. Post procedure assessment: normal

## 2017-09-08 NOTE — Op Note (Signed)
OPERATIVE REPORT  DATE OF SURGERY: 09/08/2017  PATIENT: [name], 72 y.o. male MRN: [id number]  DOB: 11/09/1945  PRE-OPERATIVE DIAGNOSIS: Bilateral infected groin incisions with large acute false aneurysm right groin status post aortobifemoral bypass April 2017  POST-OPERATIVE DIAGNOSIS:  Same  PROCEDURE: #1 bilateral groin exploration and removal of right and left limb of aortofemoral bypass and saphenous vein patch angioplasty of common femoral artery bilaterally.  #2 bilateral axillary to superficial femoral artery bypasses with 8 mm ringed Gore-Tex graft  SURGEON:  Curt Jews, M.D.  PHYSICIAN ASSISTANT: Matt Eveland PA-C  ANESTHESIA: General  EBL: 400 ml  Total I/O In: 2980 [I.V.:2100; Blood:630; IV Piggyback:250] Out: 440 [Urine:240; Blood:200]  BLOOD ADMINISTERED: 2 units  DRAINS: JP drain in right groin  SPECIMEN: Culture of right and left groin incisions  COUNTS CORRECT:  YES  PLAN OF CARE: PACU extubated  PATIENT DISPOSITION:  PACU - hemodynamically stable  PROCEDURE DETAILS: The patient was taken to the operating placed in supine position where the area of the chest abdomen both groins and both legs were prepped and draped in the usual sterile fashion.  The patient had false aneurysm in his right groin.  He had had surgery in [facility] and then had small exploration incision was made obliquely over both groins.  There was pus draining from both of these and a large false aneurysm in the right groin longitudinal incision was made through the old scar from his old aortobifemoral bypass on the.  The false aneurysm sac was left intact as dissection was used to get control of the superficial femoral and deep femoral arteries.  The false aneurysm sac was entered and there was mural thrombus present this was cultured and control was initially obtained with digital control of the graft and then the graft was occluded.  The external iliac artery was  occluded as well.  The graft was exposed up under the inguinal ligament and was poorly organized the false aneurysm rind was excised in its entirely.  The saphenous vein was exposed to the same incision opened longitudinally and was used as a vein patch angioplasty over the arteriotomy in the common femoral artery.  The right limb of the graft was oversewn up under the inguinal ligament with 2 layers of 5-0 Prolene suture.  The patient had been given 7000 units of any of intravenous heparin prior to occlusion of the arteries.  Next separate incision was made in the infraclavicular area on the right and the subclavian artery was exposed.  The artery was of large caliber and was mobilized under the axillary vein.  A separate incision was made on the mid to upper thigh to expose the superficial femoral artery which was also of large caliber.  A tunnel was created lateral taking care not to enter the prior groin incision up to the level of the axilla.  At 8 mm propatent Gore-Tex ringed graft was brought through the tunnel.  The subclavian artery was occluded proximally distally and was opened with 11 blade and sent along [place] with Potts scissors.  The graft was slightly spatulated and sewn end-to-side to the artery with a running 5-0 Prolene suture.  This anastomosis was tested and found to be adequate.  The graft was then flushed and was flushed with heparinized saline and reoccluded.  The right superficial femoral artery was occluded proximally and distally was opened with an 11 blade and sent longitudinally with Potts scissors.  The right limb of the  axillofemoral was sewn end-to-side to the superficial femoral artery with a running 6-0 Prolene suture.  Prior to completion of the anastomosis the usual flushing maneuvers were undertaken.  Anastomosis was completed and clamps were removed with excellent popliteal pulse and good flow to the foot.  Next attention was turned to the surgery of the right a 15 Blake drain  was positioned in the base of the groin wound and brought through a separate stab wound in the upper abdomen.  This was secured to the skin with a 3-0 nylon stitch.  The right groin was closed with several layers of 2-0 Vicryl in the subcutaneous tissue and the skin was closed with 2-0 nylon mattress sutures should be noted that the groin right groin was closed prior to the right axillofemoral bypass.  Next attention was turned to the left groin.  The incision that had been made at [facility] on 07/31/2017 was extended.  This was continued down to where the graft was obviously involved and there was a great deal of pus around the graft.  This was cultured.  The superficial and deep femoral arteries were exposed.  These were encircled for control.  There was disruption of the graft as well and bleeding was controlled with direct pressure and then control of the graft with a vascular clamp.  The graft was excised from the anastomosis and the external iliac artery was controlled as well.  Saphenous vein again was harvested from the left groin the used as a vein closure of the common femoral artery defect.  5-0 Prolene suture with large bites were taken.  The wound was irrigated with saline cautery.  The wound was closed with 2-0 Vicryl in the subcutaneous tissue and the skin was closed with 2-0 nylon sutures.  Next a left axillary to superficial femoral artery bypass was accomplished similar to the right.  Initially the left subclavian artery was exposed through an infraclavicular incision was of good caliber with minimal atherosclerotic change.  The superficial femoral artery was exposed in the proximal mid thigh and also was of good caliber.  A tunnel was created lateral to the left groin and an 8 mm Gore-Tex propatent ringed graft was brought through the tunnel.  This was sewn end-to-side to the left subclavian artery with a running 5-0 Prolene suture and then end-to-side to the superficial femoral artery with a  running 5-0 Prolene suture.  Clamps were removed with good flow to the left foot.  The patient was given 50 mg of protamine to reverse the heparin.  The wounds were irrigated and the axillary and superficial femoral wounds were closed with 2-0 Vicryl in the subcutaneous tissue and a 3-0 Vicryl in the subcuticular tissue.  The patient has had an extensive discussion prior to surgery and specifically refused to consent to remove the aortic portion of his graft through a laparotomy.  I did explain that he had a very high likelihood of continued infection of his remaining graft.  There was no purulence under the inguinal ligament tracking proximally but there was poor incorporation of both limbs of his graft with presumed graft infection.  Patient would be a candidate for laparotomy and removal of the now that he has excision of groin infections.  We will continue this discussion with the patient and his daughter   Rosetta Posner, M.D., [facility] 09/08/2017 11:20 PM

## 2017-09-08 NOTE — Anesthesia Procedure Notes (Signed)
Procedure Name: Intubation Date/Time: 09/08/2017 5:15 PM Performed by: White, Amedeo Plenty, CRNA Pre-anesthesia Checklist: Patient identified, Emergency Drugs available, Suction available and Patient being monitored Patient Re-evaluated:Patient Re-evaluated prior to induction Oxygen Delivery Method: Circle System Utilized Preoxygenation: Pre-oxygenation with 100% oxygen Induction Type: IV induction Ventilation: Mask ventilation without difficulty Laryngoscope Size: Mac and 4 Grade View: Grade I Tube type: Oral Tube size: 7.5 mm Number of attempts: 1 Airway Equipment and Method: Stylet Placement Confirmation: ETT inserted through vocal cords under direct vision,  positive ETCO2 and breath sounds checked- equal and bilateral Secured at: 22 cm Tube secured with: Tape Dental Injury: Teeth and Oropharynx as per pre-operative assessment

## 2017-09-09 ENCOUNTER — Inpatient Hospital Stay (HOSPITAL_COMMUNITY): Payer: Medicare Other

## 2017-09-09 ENCOUNTER — Other Ambulatory Visit: Payer: Self-pay

## 2017-09-09 ENCOUNTER — Encounter (HOSPITAL_COMMUNITY): Payer: Self-pay | Admitting: Vascular Surgery

## 2017-09-09 DIAGNOSIS — I252 Old myocardial infarction: Secondary | ICD-10-CM

## 2017-09-09 DIAGNOSIS — F17210 Nicotine dependence, cigarettes, uncomplicated: Secondary | ICD-10-CM

## 2017-09-09 DIAGNOSIS — I739 Peripheral vascular disease, unspecified: Secondary | ICD-10-CM

## 2017-09-09 DIAGNOSIS — Z8673 Personal history of transient ischemic attack (TIA), and cerebral infarction without residual deficits: Secondary | ICD-10-CM

## 2017-09-09 DIAGNOSIS — E785 Hyperlipidemia, unspecified: Secondary | ICD-10-CM

## 2017-09-09 DIAGNOSIS — Y832 Surgical operation with anastomosis, bypass or graft as the cause of abnormal reaction of the patient, or of later complication, without mention of misadventure at the time of the procedure: Secondary | ICD-10-CM

## 2017-09-09 DIAGNOSIS — I509 Heart failure, unspecified: Secondary | ICD-10-CM

## 2017-09-09 DIAGNOSIS — T827XXA Infection and inflammatory reaction due to other cardiac and vascular devices, implants and grafts, initial encounter: Principal | ICD-10-CM

## 2017-09-09 DIAGNOSIS — J449 Chronic obstructive pulmonary disease, unspecified: Secondary | ICD-10-CM

## 2017-09-09 DIAGNOSIS — I2510 Atherosclerotic heart disease of native coronary artery without angina pectoris: Secondary | ICD-10-CM

## 2017-09-09 DIAGNOSIS — Z955 Presence of coronary angioplasty implant and graft: Secondary | ICD-10-CM

## 2017-09-09 DIAGNOSIS — I110 Hypertensive heart disease with heart failure: Secondary | ICD-10-CM

## 2017-09-09 LAB — CBC
HCT: 28.6 % — ABNORMAL LOW (ref 39.0–52.0)
Hemoglobin: 9.1 g/dL — ABNORMAL LOW (ref 13.0–17.0)
MCH: 25.9 pg — AB (ref 26.0–34.0)
MCHC: 31.8 g/dL (ref 30.0–36.0)
MCV: 81.3 fL (ref 78.0–100.0)
PLATELETS: 267 10*3/uL (ref 150–400)
RBC: 3.52 MIL/uL — AB (ref 4.22–5.81)
RDW: 19 % — ABNORMAL HIGH (ref 11.5–15.5)
WBC: 12.2 10*3/uL — ABNORMAL HIGH (ref 4.0–10.5)

## 2017-09-09 LAB — POCT I-STAT 7, (LYTES, BLD GAS, ICA,H+H)
ACID-BASE EXCESS: 1 mmol/L (ref 0.0–2.0)
BICARBONATE: 27.4 mmol/L (ref 20.0–28.0)
CALCIUM ION: 1.17 mmol/L (ref 1.15–1.40)
HEMATOCRIT: 27 % — AB (ref 39.0–52.0)
Hemoglobin: 9.2 g/dL — ABNORMAL LOW (ref 13.0–17.0)
O2 SAT: 100 %
PH ART: 7.35 (ref 7.350–7.450)
POTASSIUM: 4.3 mmol/L (ref 3.5–5.1)
Patient temperature: 36.7
SODIUM: 136 mmol/L (ref 135–145)
TCO2: 29 mmol/L (ref 22–32)
pCO2 arterial: 49.6 mmHg — ABNORMAL HIGH (ref 32.0–48.0)
pO2, Arterial: 179 mmHg — ABNORMAL HIGH (ref 83.0–108.0)

## 2017-09-09 LAB — MRSA PCR SCREENING: MRSA BY PCR: NEGATIVE

## 2017-09-09 LAB — CREATININE, SERUM
CREATININE: 0.68 mg/dL (ref 0.61–1.24)
GFR calc Af Amer: >60 mL/min (ref >60)

## 2017-09-09 MED ORDER — VITAMIN B-1 100 MG PO TABS
100.0000 mg | ORAL_TABLET | Freq: Every day | ORAL | Status: DC
  Administered 2017-09-09 – 2017-09-16 (×8): 100 mg via ORAL
  Filled 2017-09-09 (×9): qty 1

## 2017-09-09 MED ORDER — ACETAMINOPHEN 325 MG PO TABS: 325.0000 mg | ORAL_TABLET | ORAL | Status: DC | PRN

## 2017-09-09 MED ORDER — SODIUM CHLORIDE 0.9 % IV SOLN
500.0000 mL | Freq: Once | INTRAVENOUS | Status: AC | PRN
  Administered 2017-09-09: 500 mL via INTRAVENOUS

## 2017-09-09 MED ORDER — MAGNESIUM SULFATE 2 GM/50ML IV SOLN
2.0000 g | Freq: Every day | INTRAVENOUS | Status: DC | PRN
  Filled 2017-09-09: qty 50

## 2017-09-09 MED ORDER — ACETAMINOPHEN 325 MG RE SUPP
325.0000 mg | RECTAL | Status: DC | PRN
  Filled 2017-09-09: qty 2

## 2017-09-09 MED ORDER — SODIUM CHLORIDE 0.9% FLUSH
10.0000 mL | Freq: Two times a day (BID) | INTRAVENOUS | Status: DC
  Administered 2017-09-09 – 2017-09-16 (×7): 10 mL

## 2017-09-09 MED ORDER — TIOTROPIUM BROMIDE MONOHYDRATE 18 MCG IN CAPS
18.0000 ug | ORAL_CAPSULE | Freq: Every day | RESPIRATORY_TRACT | Status: DC
Start: 2017-09-09 — End: 2017-09-16
  Administered 2017-09-09 – 2017-09-16 (×7): 18 ug via RESPIRATORY_TRACT
  Filled 2017-09-09 (×2): qty 5

## 2017-09-09 MED ORDER — NITROGLYCERIN 0.4 MG SL SUBL: 0.4000 mg | SUBLINGUAL_TABLET | SUBLINGUAL | Status: DC | PRN

## 2017-09-09 MED ORDER — ALUM & MAG HYDROXIDE-SIMETH 200-200-20 MG/5ML PO SUSP: 15.0000 mL | ORAL | Status: DC | PRN

## 2017-09-09 MED ORDER — HYDRALAZINE HCL 20 MG/ML IJ SOLN: 5.0000 mg | INTRAMUSCULAR | Status: DC | PRN

## 2017-09-09 MED ORDER — OXYCODONE-ACETAMINOPHEN 5-325 MG PO TABS
1.0000 | ORAL_TABLET | ORAL | Status: DC | PRN
  Administered 2017-09-09: 2 via ORAL
  Administered 2017-09-09: 1 via ORAL
  Administered 2017-09-10 – 2017-09-13 (×15): 2 via ORAL
  Administered 2017-09-13: 1 via ORAL
  Administered 2017-09-13 – 2017-09-14 (×8): 2 via ORAL
  Administered 2017-09-14: 1 via ORAL
  Administered 2017-09-15 – 2017-09-16 (×7): 2 via ORAL
  Filled 2017-09-09 (×9): qty 2
  Filled 2017-09-09: qty 1
  Filled 2017-09-09 (×24): qty 2

## 2017-09-09 MED ORDER — GUAIFENESIN-DM 100-10 MG/5ML PO SYRP: 15.0000 mL | ORAL_SOLUTION | ORAL | Status: DC | PRN

## 2017-09-09 MED ORDER — ESCITALOPRAM OXALATE 20 MG PO TABS
20.0000 mg | ORAL_TABLET | Freq: Every day | ORAL | Status: DC
  Administered 2017-09-09 – 2017-09-16 (×8): 20 mg via ORAL
  Filled 2017-09-09: qty 2
  Filled 2017-09-09 (×2): qty 1
  Filled 2017-09-09 (×2): qty 2
  Filled 2017-09-09: qty 1
  Filled 2017-09-09: qty 2
  Filled 2017-09-09: qty 1

## 2017-09-09 MED ORDER — ATORVASTATIN CALCIUM 80 MG PO TABS
80.0000 mg | ORAL_TABLET | Freq: Every day | ORAL | Status: DC
  Administered 2017-09-09 – 2017-09-15 (×7): 80 mg via ORAL
  Filled 2017-09-09 (×8): qty 1

## 2017-09-09 MED ORDER — CHLORHEXIDINE GLUCONATE CLOTH 2 % EX PADS
6.0000 | MEDICATED_PAD | Freq: Every day | CUTANEOUS | Status: DC
  Administered 2017-09-09 – 2017-09-16 (×5): 6 via TOPICAL

## 2017-09-09 MED ORDER — SODIUM CHLORIDE 0.9% FLUSH: 10.0000 mL | INTRAVENOUS | Status: DC | PRN

## 2017-09-09 MED ORDER — HEPARIN SODIUM (PORCINE) 5000 UNIT/ML IJ SOLN
5000.0000 [IU] | Freq: Three times a day (TID) | INTRAMUSCULAR | Status: DC
  Administered 2017-09-09 (×2): 5000 [IU] via SUBCUTANEOUS
  Filled 2017-09-09 (×2): qty 1

## 2017-09-09 MED ORDER — SODIUM CHLORIDE 0.9 % IV SOLN
INTRAVENOUS | Status: DC
  Administered 2017-09-09: 1000 mL via INTRAVENOUS
  Administered 2017-09-10 – 2017-09-11 (×3): via INTRAVENOUS

## 2017-09-09 MED ORDER — AMIODARONE HCL 200 MG PO TABS
200.0000 mg | ORAL_TABLET | Freq: Every day | ORAL | Status: DC
  Administered 2017-09-09 – 2017-09-16 (×8): 200 mg via ORAL
  Filled 2017-09-09 (×8): qty 1

## 2017-09-09 MED ORDER — FOLIC ACID 1 MG PO TABS
1.0000 mg | ORAL_TABLET | Freq: Every day | ORAL | Status: DC
  Administered 2017-09-09 – 2017-09-16 (×8): 1 mg via ORAL
  Filled 2017-09-09 (×8): qty 1

## 2017-09-09 MED ORDER — MORPHINE SULFATE (PF) 4 MG/ML IV SOLN
4.0000 mg | INTRAVENOUS | Status: DC | PRN
  Administered 2017-09-09 – 2017-09-16 (×6): 4 mg via INTRAVENOUS
  Filled 2017-09-09 (×6): qty 1

## 2017-09-09 MED ORDER — POTASSIUM CHLORIDE CRYS ER 20 MEQ PO TBCR: 20.0000 meq | EXTENDED_RELEASE_TABLET | Freq: Every day | ORAL | Status: DC | PRN

## 2017-09-09 MED ORDER — ASPIRIN EC 81 MG PO TBEC
81.0000 mg | DELAYED_RELEASE_TABLET | Freq: Every day | ORAL | Status: DC
  Administered 2017-09-09 – 2017-09-16 (×8): 81 mg via ORAL
  Filled 2017-09-09 (×8): qty 1

## 2017-09-09 MED ORDER — FLUTICASONE FUROATE-VILANTEROL 100-25 MCG/INH IN AEPB
1.0000 | INHALATION_SPRAY | Freq: Every day | RESPIRATORY_TRACT | Status: DC
  Administered 2017-09-09 – 2017-09-16 (×7): 1 via RESPIRATORY_TRACT
  Filled 2017-09-09: qty 28

## 2017-09-09 MED ORDER — METOPROLOL TARTRATE 5 MG/5ML IV SOLN: 2.0000 mg | INTRAVENOUS | Status: DC | PRN

## 2017-09-09 MED ORDER — ISOSORBIDE MONONITRATE ER 30 MG PO TB24
15.0000 mg | ORAL_TABLET | Freq: Every day | ORAL | Status: DC
  Administered 2017-09-10 – 2017-09-11 (×2): 15 mg via ORAL
  Filled 2017-09-09 (×2): qty 1

## 2017-09-09 MED ORDER — TIZANIDINE HCL 4 MG PO TABS
4.0000 mg | ORAL_TABLET | Freq: Every day | ORAL | Status: DC
  Administered 2017-09-09 – 2017-09-15 (×8): 4 mg via ORAL
  Filled 2017-09-09 (×9): qty 1

## 2017-09-09 MED ORDER — DEXTROSE 5 % IV SOLN
2.0000 g | Freq: Three times a day (TID) | INTRAVENOUS | Status: DC
  Administered 2017-09-09 – 2017-09-15 (×18): 2 g via INTRAVENOUS
  Filled 2017-09-09 (×19): qty 2

## 2017-09-09 MED ORDER — ORAL CARE MOUTH RINSE
15.0000 mL | Freq: Two times a day (BID) | OROMUCOSAL | Status: DC
  Administered 2017-09-09 – 2017-09-12 (×2): 15 mL via OROMUCOSAL

## 2017-09-09 MED ORDER — PANTOPRAZOLE SODIUM 40 MG PO TBEC
40.0000 mg | DELAYED_RELEASE_TABLET | Freq: Every day | ORAL | Status: DC
  Administered 2017-09-09 – 2017-09-16 (×8): 40 mg via ORAL
  Filled 2017-09-09 (×8): qty 1

## 2017-09-09 MED ORDER — SODIUM CHLORIDE 0.9 % IV BOLUS (SEPSIS)
500.0000 mL | Freq: Once | INTRAVENOUS | Status: AC
  Administered 2017-09-09: 500 mL via INTRAVENOUS

## 2017-09-09 MED ORDER — TIZANIDINE HCL 4 MG PO TABS
8.0000 mg | ORAL_TABLET | Freq: Every day | ORAL | Status: DC
  Administered 2017-09-09 – 2017-09-10 (×2): 8 mg via ORAL
  Administered 2017-09-11: 4 mg via ORAL
  Administered 2017-09-12 – 2017-09-15 (×4): 8 mg via ORAL
  Filled 2017-09-09 (×9): qty 2

## 2017-09-09 MED ORDER — DOCUSATE SODIUM 100 MG PO CAPS
100.0000 mg | ORAL_CAPSULE | Freq: Every day | ORAL | Status: DC
  Administered 2017-09-10 – 2017-09-15 (×6): 100 mg via ORAL
  Filled 2017-09-09 (×8): qty 1

## 2017-09-09 MED ORDER — PIPERACILLIN-TAZOBACTAM 3.375 G IVPB
3.3750 g | Freq: Three times a day (TID) | INTRAVENOUS | Status: DC
  Administered 2017-09-09 (×2): 3.375 g via INTRAVENOUS
  Filled 2017-09-09 (×3): qty 50

## 2017-09-09 MED ORDER — ALBUTEROL SULFATE (2.5 MG/3ML) 0.083% IN NEBU: 3.0000 mL | INHALATION_SOLUTION | Freq: Four times a day (QID) | RESPIRATORY_TRACT | Status: DC | PRN

## 2017-09-09 MED ORDER — PHENOL 1.4 % MT LIQD: 1.0000 | OROMUCOSAL | Status: DC | PRN

## 2017-09-09 MED ORDER — LABETALOL HCL 5 MG/ML IV SOLN: 10.0000 mg | INTRAVENOUS | Status: DC | PRN

## 2017-09-09 MED ORDER — ONDANSETRON HCL 4 MG/2ML IJ SOLN: 4.0000 mg | Freq: Four times a day (QID) | INTRAMUSCULAR | Status: DC | PRN

## 2017-09-09 MED ORDER — VANCOMYCIN HCL IN DEXTROSE 1-5 GM/200ML-% IV SOLN
1000.0000 mg | INTRAVENOUS | Status: DC
  Administered 2017-09-09 – 2017-09-12 (×4): 1000 mg via INTRAVENOUS
  Filled 2017-09-09 (×6): qty 200

## 2017-09-09 MED ORDER — MIRTAZAPINE 30 MG PO TBDP
30.0000 mg | ORAL_TABLET | Freq: Every day | ORAL | Status: DC
  Administered 2017-09-09 – 2017-09-15 (×8): 30 mg via ORAL
  Filled 2017-09-09 (×8): qty 1

## 2017-09-09 MED ORDER — DOPAMINE-DEXTROSE 3.2-5 MG/ML-% IV SOLN
5.0000 ug/kg/min | INTRAVENOUS | Status: DC
  Administered 2017-09-09: 5 ug/kg/min via INTRAVENOUS
  Filled 2017-09-09: qty 250

## 2017-09-09 MED ORDER — RIVAROXABAN 20 MG PO TABS: 20.0000 mg | ORAL_TABLET | Freq: Every day | ORAL | Status: DC

## 2017-09-09 NOTE — Progress Notes (Signed)
ABI's have been completed. Right 0.79 Left 0.75  09/09/17 12:25 PM Carlos Levering RVT

## 2017-09-09 NOTE — Progress Notes (Signed)
RN to bedside for hourly rounding. PT's BP 52/44 (48), rechecked 60/36(47). HR 55's. Pt awake, but drowsy, following commands. No signs of bleeding observed or assessed. Per emergency standing orders 500cc NS  bolus started. Dr. Donnetta Hutching paged. OR RN returned page for MD. Dr. Donnetta Hutching to come to bedside once procedure completed. Pt returned to bed from chair.  Verbal orders from Dr. Donnetta Hutching at the beside to initiate Dopamine drip at 33mcg/kg/min for HR and BP.  BP gradually improving. Will continue to monitor and assess pt closely.

## 2017-09-09 NOTE — Consult Note (Addendum)
[facility] for Infectious Disease    Date of Admission:  09/08/2017           Day 1 vancomycin        Day 1 piperacillin tazobactam       Reason for Consult: Vascular graft infection    Referring Provider: Dr. 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  I note that he was hospitalized in July of last year with staph hominis bacteremia.  It is possible that that is the pathogen causing his vascular graft infection.  I will continue vancomycin cover all staph and narrow piperacillin tazobactam to ceftazidime for continued gram-negative rod coverage pending final culture results.  Ideally, it would be best to also use rifampin but that cannot be done in this case because of drug drug interactions between rifampin and anticoagulants.  Plan: 1. Continue vancomycin 2. Change piperacillin tazobactam to ceftazidime 3. Await results of final cultures  Principal Problem:   Infection of vascular bypass graft (HCC) Active Problems:   Postoperative groin pseudoaneurysm (HCC)   PAD (peripheral artery disease) (HCC)   Scheduled Meds: . amiodarone  200 mg Oral Daily  . aspirin EC  81 mg Oral Daily  . atorvastatin  80 mg Oral q1800  . Chlorhexidine Gluconate Cloth  6 each Topical Daily  . [START ON 09/10/2017] docusate sodium  100 mg Oral Daily  . escitalopram  20 mg Oral Daily  . fluticasone furoate-vilanterol  1 puff Inhalation Daily  . folic acid  1 mg Oral Daily  . heparin  5,000 Units Subcutaneous Q8H  . isosorbide mononitrate  15 mg Oral Daily  . mouth rinse  15 mL Mouth Rinse BID  . mirtazapine  30 mg Oral QHS  . pantoprazole  40 mg Oral Daily  . rifampin  600 mg Does not apply Once  . [START ON 09/11/2017] rivaroxaban  20 mg Oral Q supper  . sodium chloride flush   10-40 mL Intracatheter Q12H  . thiamine  100 mg Oral Daily  . tiotropium  18 mcg Inhalation Daily  . tiZANidine  4 mg Oral QHS  . tiZANidine  8 mg Oral Daily   Continuous Infusions: . sodium chloride    . sodium chloride    . sodium chloride 100 mL/hr at 09/09/17 0700  . magnesium sulfate 1 - 4 g bolus IVPB    . piperacillin-tazobactam (ZOSYN)  IV 3.375 g (09/09/17 0541)  . vancomycin     PRN Meds:.acetaminophen **OR** acetaminophen, albuterol, alum & mag hydroxide-simeth, guaiFENesin-dextromethorphan, hydrALAZINE, labetalol, magnesium sulfate 1 - 4 g bolus IVPB, metoprolol tartrate, morphine injection, nitroGLYCERIN, ondansetron, oxyCODONE-acetaminophen, phenol, potassium chloride, sodium chloride flush  HPI: [name] is a 72 y.o. male with multiple chronic medical conditions who underwent aortobifemoral bypass grafting in April 2017.  He developed a ventral incisional hernia and was admitted to [facility] on 07/31/2017.  He underwent hernia repair.  He also had incidental incision and drainage of bilateral groin areas.  Purulence and necrosis was noted.  Operative Gram stain showed no organisms and cultures were negative.  He was discharged on empiric doxycycline.  He recently developed acute swelling of his right groin and was admitted here yesterday.  He was found to have an  infected pseudoaneurysm.  He underwent emergent surgery with resection of both left and right limbs of the graft and bilateral axillary to femoral bypass grafts.  Operative Gram stain shows no organisms.  Cultures are pending.   Review of Systems: Review of Systems  Unable to perform ROS: Mental acuity  Constitutional:       He is very lethargic and sleepy.    Past Medical History:  Diagnosis Date  . AAA (abdominal aortic aneurysm) ([place])   . AAA (abdominal aortic aneurysm) without rupture ([place])   . Acute respiratory failure ([place]) 03/13/2017  . Anxiety   . Atrial fibrillation (HCC)     Brief  episode of  . Atrial fibrillation with rapid ventricular response ([place]) 01/06/2016  . Cardiomyopathy, ischemic 10/27/2014  . CHF (congestive heart failure) ([place])   . COPD (chronic obstructive pulmonary disease) ([place])   . Coronary artery disease    Multivessel, S/p stenting to LCx. LHC 03/16/17: unchanged MVD- 50%mid LAD, 85% 1st daig, 100% prox circ, 100% prox RCA  . Depression   . Dyslipidemia   . Headache   . Hyperlipidemia   . Hypertension   . Myocardial infarction ([place])   . Peripheral vascular disease ([place])   . 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 Shortness of breath dyspnea   . Stroke ([place])   . Tobacco dependence   . Ventricular dysfunction    Mild Left    Social History   Tobacco Use  . Smoking status: Current Every Day Smoker    Packs/day: 0.50    Years: 20.00    Pack years: 10.00    Types: Cigarettes  . Smokeless tobacco: Never Used  Substance Use Topics  . Alcohol use: No  . Drug use: No    Family History  Problem Relation Age of Onset  . AAA (abdominal aortic aneurysm) Mother   . Cancer Father        bone marrow   Allergies  Allergen Reactions  . Aspirin Nausea Only and Other (See Comments)    Stomach upset  . Ibuprofen Hives  . Aspirin Hives  . Ibuprofen Hives    OBJECTIVE: Blood pressure (!) 84/49, pulse 65, temperature 98.3 F (36.8 C), temperature source Oral, resp. rate 13, height 5\' 6"  (1.676 m), weight 124 lb 5.4 oz (56.4 kg), SpO2 94 %.  Physical Exam  Constitutional:  He is very sleepy.  He is sitting up in a chair.  Very slow to answer questions.  Cardiovascular: Normal rate and regular rhythm.  No murmur heard. Pulmonary/Chest: Effort normal.  Coarse rhonchi.  Abdominal: Soft. He exhibits no distension.  Some bloody drainage on his groin dressings.    Lab Results Lab Results  Component Value Date   WBC 12.2 (H) 09/09/2017   HGB 9.1 (L) 09/09/2017   HCT 28.6 (L) 09/09/2017   MCV 81.3 09/09/2017   PLT 267 09/09/2017    Lab Results   Component Value Date   CREATININE 0.68 09/09/2017   BUN 16 05/08/2017   NA 136 09/08/2017   K 4.1 09/08/2017   CL 102 05/08/2017   CO2 25 05/08/2017    Lab Results  Component Value Date   ALT 23 03/20/2017   AST 20 03/20/2017   ALKPHOS 55 03/20/2017   BILITOT 0.5 03/20/2017     Microbiology: Recent Results (from the past 240 hour(s))  Aerobic/Anaerobic Culture (surgical/deep wound)     Status: None (Preliminary result)   Collection Time: 09/08/17  4:25 PM  Result Value  Ref Range Status   Specimen Description WOUND RIGHT GROIN  Final   Special Requests PATIENT ON FOLLOWING VANCOMYCIN,ZOSYN  Final   Gram Stain   Final    RARE WBC PRESENT, PREDOMINANTLY PMN NO ORGANISMS SEEN    Culture PENDING  Incomplete   Report Status PENDING  Incomplete  Aerobic/Anaerobic Culture (surgical/deep wound)     Status: None (Preliminary result)   Collection Time: 09/08/17  8:51 PM  Result Value Ref Range Status   Specimen Description WOUND LEFT GROIN  Final   Special Requests PATIENT ON FOLLOWING VANCOMYCIN,ZOSYN  Final   Gram Stain   Final    FEW WBC PRESENT, PREDOMINANTLY PMN NO ORGANISMS SEEN    Culture PENDING  Incomplete   Report Status PENDING  Incomplete  MRSA PCR Screening     Status: None   Collection Time: 09/09/17 12:32 AM  Result Value Ref Range Status   MRSA by PCR NEGATIVE NEGATIVE Final    Comment:        The GeneXpert MRSA Assay (FDA approved for NASAL specimens only), is one component of a comprehensive MRSA colonization surveillance program. It is not intended to diagnose MRSA infection nor to guide or monitor treatment for MRSA infections.     Michel Bickers, MD [facility] [place] [facility] [phone] pager   [phone] cell 09/09/2017, 10:12 AM

## 2017-09-09 NOTE — Progress Notes (Addendum)
  Progress Note    09/09/2017 8:31 AM 1 Day Post-Op  Subjective:  Soreness along tunneling tracts bilaterally   Vitals:   09/09/17 0700 09/09/17 0829  BP: (!) 80/50   Pulse: 61   Resp: 14   Temp:  98.3 F (36.8 C)  SpO2: 95%    Physical Exam: Cardiac:  Regular Lungs:  Decreased lung sounds B lung bases Incisions:  B chest incisions with local ecchymosis however soft and without drainage; B groin incisions with mild to moderate serosanguinous collection on dressings; minimal output R JP; B thigh incisions soft; no palpable hematomas along tunneling tracts Extremities:  Palpable and symmetrical DP pulses Abdomen:  Soft Neurologic: A&O  CBC    Component Value Date/Time   WBC 12.2 (H) 09/09/2017 0348   RBC 3.52 (L) 09/09/2017 0348   HGB 9.1 (L) 09/09/2017 0348   HGB 8.2 (L) 05/08/2017 0907   HCT 28.6 (L) 09/09/2017 0348   HCT 27.6 (L) 05/08/2017 0907   PLT 267 09/09/2017 0348   PLT 376 05/08/2017 0907   MCV 81.3 09/09/2017 0348   MCV 77 (L) 05/08/2017 0907   MCH 25.9 (L) 09/09/2017 0348   MCHC 31.8 09/09/2017 0348   RDW 19.0 (H) 09/09/2017 0348   RDW 18.2 (H) 05/08/2017 0907   LYMPHSABS 0.8 03/28/2017 2131   MONOABS 0.3 03/28/2017 2131   EOSABS 0.1 03/28/2017 2131   BASOSABS 0.0 03/28/2017 2131    BMET    Component Value Date/Time   NA 136 09/08/2017 2011   NA 141 05/08/2017 0907   K 4.1 09/08/2017 2011   CL 102 05/08/2017 0907   CO2 25 05/08/2017 0907   GLUCOSE 165 (H) 09/08/2017 2011   BUN 16 05/08/2017 0907   CREATININE 0.68 09/09/2017 0348   CREATININE 0.8 10/08/2015 1500   CALCIUM 8.9 05/08/2017 0907   GFRNONAA >60 09/09/2017 0348   GFRAA >60 09/09/2017 0348    INR    Component Value Date/Time   INR 1.04 03/20/2017 0743     Intake/Output Summary (Last 24 hours) at 09/09/2017 0831 Last data filed at 09/09/2017 0700 Gross per 24 hour  Intake 5400 ml  Output 1416 ml  Net 3984 ml     Assessment/Plan:  72 y.o. male is s/p #1 bilateral groin  exploration and removal of right and left limb of aortofemoral bypass and saphenous vein patch angioplasty of common femoral artery bilaterally.  #2 bilateral axillary to superficial femoral artery bypasses with 8 mm ringed Gore-Tex graft 1 Day Post-Op   D/c foley Continue IV vanc and zosyn; cultures pending; ID consulted Advancing diet; tolerating liquids Monitor peripheral signals Dry dressing changes daily B groins Continue to hold Xarelto Patient is at high risk for continued infection of remaining ABF bypass graft  DVT prophylaxis:  subq heparin   Dagoberto Ligas, PA-C [facility] [phone] 09/09/2017 8:31 AM  Addendum  I have independently interviewed and examined the patient, and I agree with the physician assistant's findings.    - ID c/s - prior to D/C will need WBC scan to look at main body and residual limbs - pt has been reluctant to allow repeat laparotomy   Adele Barthel, MD, FACS [facility] [place]: [phone] Pager: [phone]  09/09/2017, 10:26 AM

## 2017-09-09 NOTE — Anesthesia Postprocedure Evaluation (Signed)
Anesthesia Post Note  Patient: Demetric Dunnaway Guidroz  Procedure(s) Performed: Bilateral Groin Exploration, Removal of Prosthetic graft. Vein Patch Closure of bilateral common femoral Arteries. (Bilateral Groin) BYPASS GRAFT AXILLA-BIFEMORAL (Bilateral Groin)     Patient location during evaluation: PACU Anesthesia Type: General Level of consciousness: awake and alert Pain management: pain level controlled Vital Signs Assessment: post-procedure vital signs reviewed and stable Respiratory status: spontaneous breathing, nonlabored ventilation, respiratory function stable and patient connected to nasal cannula oxygen Cardiovascular status: blood pressure returned to baseline and stable Postop Assessment: no apparent nausea or vomiting Anesthetic complications: no    Last Vitals:  Vitals:   09/09/17 0630 09/09/17 0700  BP: (!) 72/48 (!) 80/50  Pulse: 64 61  Resp: 16 14  Temp:    SpO2: 95% 95%    Last Pain:  Vitals:   09/09/17 0600  TempSrc:   PainSc: 0-No pain                 Cedricka Sackrider S

## 2017-09-09 NOTE — Progress Notes (Signed)
PT Cancellation Note  Patient Details Name: [name] MRN: [id number] DOB: August 18, 1946   Cancelled Treatment:    Reason Eval/Treat Not Completed: Medical issues which prohibited therapy. Per RN, pt with significantly low BP requesting PT hold off this afternoon. Will follow-up for PT evaluation tomorrow.  Mabeline Caras, PT, DPT Acute Rehab Services  Pager: [phone] 09/09/2017, 12:38 PM

## 2017-09-09 NOTE — Progress Notes (Signed)
Pharmacy Antibiotic Note  [name] is a 72 y.o. male admitted on 09/08/2017 with aortic bifemoral graft infection. Pharmacy has been consulted for ceftazidime dosing. 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 Patient is s/p removal of right and left limb of aortofemoral bypass on 1/1. Patient has been on Zosyn and will now be switched to ceftazidime. WBC - 12.2 and patient afebrile. Scr stable at < 1.   Plan: Ceftazidime 2 gm every 8 hours  Monitor cultures, renal function , clinical s/sx of infection  Height: 5\' 6"  (167.6 cm) Weight: 124 lb 5.4 oz (56.4 kg) IBW/kg (Calculated) : 63.8  Temp (24hrs), Avg:98.1 F (36.7 C), Min:97.5 F (36.4 C), Max:98.4 F (36.9 C)  Recent Labs  Lab 09/08/17 1650 09/09/17 0348  WBC 7.8 12.2*  CREATININE  --  0.68    Estimated Creatinine Clearance: 68.5 mL/min (by C-G formula based on SCr of 0.68 mg/dL).    Allergies  Allergen Reactions  . Aspirin Nausea Only and Other (See Comments)    Stomach upset  . Ibuprofen Hives  . Aspirin Hives  . Ibuprofen Hives    Antimicrobials this admission: 1/1 Vanc>> 1/1 Zosyn >>1/2 1/2 Ceftaz>>   Microbiology results: 1/1 Right groin wound: Pending 1/1 Left groin wound: Pending 1/2  MRSA PCR: Negative   Thank you for allowing pharmacy to be a part of this patient's care.  Susa Raring, PharmD, BCPS PGY2 Infectious Diseases Pharmacy Resident Pager: [phone]  09/09/2017 10:34 AM

## 2017-09-09 NOTE — Progress Notes (Signed)
Pharmacy Antibiotic Note  [name] is a 71 y.o. male admitted on 09/08/2017 with infection of his aortobifemoral bypass.  Pharmacy has been consulted for vancomycin dosing.  Plan: Vanc 1g given perioperatively. Vancomycin 1000mg  IV every 24 hours.  Goal trough 10-20 mcg/mL.  Height: 5' 6.5" (168.9 cm) Weight: 121 lb 0.5 oz (54.9 kg) IBW/kg (Calculated) : 64.95  Temp (24hrs), Avg:97.7 F (36.5 C), Min:97.5 F (36.4 C), Max:97.9 F (36.6 C)  Recent Labs  Lab 09/08/17 1650  WBC 7.8     Allergies  Allergen Reactions  . Aspirin Nausea Only and Other (See Comments)    Stomach upset  . Ibuprofen Hives  . Aspirin Hives  . Ibuprofen Hives    Thank you for allowing pharmacy to be a part of this patient's care.  Wynona Neat, PharmD, BCPS  09/09/2017 12:53 AM

## 2017-09-10 LAB — BASIC METABOLIC PANEL
ANION GAP: 4 — AB (ref 5–15)
BUN: 13 mg/dL (ref 6–20)
CALCIUM: 7.6 mg/dL — AB (ref 8.9–10.3)
CO2: 28 mmol/L (ref 22–32)
CREATININE: 0.6 mg/dL — AB (ref 0.61–1.24)
Chloride: 106 mmol/L (ref 101–111)
GFR calc Af Amer: >60 mL/min (ref >60)
Glucose, Bld: 102 mg/dL — ABNORMAL HIGH (ref 65–99)
Potassium: 3.8 mmol/L (ref 3.5–5.1)
Sodium: 138 mmol/L (ref 135–145)

## 2017-09-10 LAB — CBC
HCT: 26.1 % — ABNORMAL LOW (ref 39.0–52.0)
Hemoglobin: 8 g/dL — ABNORMAL LOW (ref 13.0–17.0)
MCH: 25.1 pg — ABNORMAL LOW (ref 26.0–34.0)
MCHC: 30.7 g/dL (ref 30.0–36.0)
MCV: 81.8 fL (ref 78.0–100.0)
PLATELETS: 273 10*3/uL (ref 150–400)
RBC: 3.19 MIL/uL — ABNORMAL LOW (ref 4.22–5.81)
RDW: 19.1 % — AB (ref 11.5–15.5)
WBC: 9.7 10*3/uL (ref 4.0–10.5)

## 2017-09-10 LAB — PREPARE RBC (CROSSMATCH)

## 2017-09-10 MED ORDER — SODIUM CHLORIDE 0.9 % IV SOLN
Freq: Once | INTRAVENOUS | Status: AC
  Administered 2017-09-10: 14:00:00 via INTRAVENOUS

## 2017-09-10 MED ORDER — DOPAMINE-DEXTROSE 3.2-5 MG/ML-% IV SOLN
5.0000 ug/kg/min | INTRAVENOUS | Status: DC
Start: 2017-09-10 — End: 2017-09-16

## 2017-09-10 MED ORDER — HEPARIN SODIUM (PORCINE) 5000 UNIT/ML IJ SOLN
5000.0000 [IU] | Freq: Three times a day (TID) | INTRAMUSCULAR | Status: DC
  Administered 2017-09-10 – 2017-09-15 (×14): 5000 [IU] via SUBCUTANEOUS
  Filled 2017-09-10 (×13): qty 1

## 2017-09-10 NOTE — Progress Notes (Signed)
Unable to wean dopamine off at this time. Dr. Donnetta Hutching made aware. Orders received to give one unit of blood. Will continue to monitor.

## 2017-09-10 NOTE — Evaluation (Addendum)
Occupational Therapy Evaluation Patient Details Name: [name] MRN: [id number] DOB: [birth year] Today's Date: 09/10/2017    History of Present Illness This 72 y.o. male for acute false aneurysm Rt groin. He was found to have bil. infected groin incisions (s/p aortobifemoral bypass 4/17) He underwent bil. groin exploration and removal of Rt and Lt limb of aortofemoral bypass and saphenous vein patch angioplasty of common femoral artery bil. as wall as bil. axillary to superficial femoral artery bypasses.  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  He underwent repair of ventral incisional hernia at [facility] 07/31/17 as well as bil. groin exploration; AAA, anxiety, A-Fib with RVR; CHF, cardiomyopathy, COPD, CAD, MI, PVD, CVA, ventricular dysfunction    Clinical Impression   Pt admitted with above. He demonstrates the below listed deficits and will benefit from continued OT to maximize safety and independence with BADLs.  Pt seen with PT.  Eval limited by decreased BP upon moving to EOB sitting.  BP 71/47 (see comments below for details of vitals).   Unable to progress pt further this date.  He will benefit from continued OT to maximize safety and independence with ADLs.  PTA, he lived with son and brother in law, who can assist as needed, and pt was fully independent.       Follow Up Recommendations  SNF;Home health OT(unable to accurately determine at present due to limited eva)    Equipment Recommendations  3 in 1 bedside commode    Recommendations for Other Services       Precautions / Restrictions Precautions Precautions: Fall Precaution Comments: monitor BP       Mobility Bed Mobility Overal bed mobility: Needs Assistance Bed Mobility: Supine to Sit;Sit to Supine     Supine to sit: Min guard Sit to supine: Min guard      Transfers                 General transfer comment: unable to attempt due to  decreased BP     Balance Overall balance assessment: Needs assistance Sitting-balance support: Feet unsupported Sitting balance-Leahy Scale: Fair                                     ADL either performed or assessed with clinical judgement   ADL Overall ADL's : Needs assistance/impaired Eating/Feeding: Independent;Bed level   Grooming: Wash/dry hands;Wash/dry face;Oral care;Brushing hair;Set up;Sitting   Upper Body Bathing: Set up;Sitting   Lower Body Bathing: Maximal assistance;Sit to/from stand   Upper Body Dressing : Set up;Sitting   Lower Body Dressing: Maximal assistance;Sit to/from Health and safety inspector Details (indicate cue type and reason): uanble to assess due to decreased BP                  Vision         Perception     Praxis      Pertinent Vitals/Pain Pain Assessment: Faces Faces Pain Scale: Hurts little more Pain Location: neck, groin  Pain Descriptors / Indicators: Aching;Grimacing Pain Intervention(s): Monitored during session     Hand Dominance Right   Extremity/Trunk Assessment Upper Extremity Assessment Upper Extremity Assessment: Generalized weakness   Lower Extremity Assessment Lower Extremity Assessment: Defer to PT evaluation   Cervical / Trunk Assessment Cervical / Trunk Assessment: Kyphotic   Communication Communication Communication: Other (comment)(low volume - difficult to understand  at times )   Cognition Arousal/Alertness: Awake/alert;Lethargic Behavior During Therapy: Flat affect Overall Cognitive Status: Within Functional Limits for tasks assessed(grossly assessed )                                     General Comments  BP supine 116/60 with 02 sats 97% on 2L 02; seated BP 71/49 (55) sats 93% on RA; seated after 2 mins 73/57 sats 84% RA.  Pt returned to supine with BP 89/53 and pt placed back on 2L 02 with sats 91%.  RN notified and in room     Exercises     Shoulder  Instructions      Home Living Family/patient expects to be discharged to:: Private residence Living Arrangements: Children;Non-relatives/Friends Available Help at Discharge: Family;Available 24 hours/day Type of Home: Mobile home Home Access: Stairs to enter Entrance Stairs-Number of Steps: 1   Home Layout: One level     Bathroom Shower/Tub: Occupational psychologist: Standard     Home Equipment: Shower seat          Prior Functioning/Environment Level of Independence: Independent        Comments: Pt reports he drives and grocery shops         OT Problem List: Decreased strength;Decreased activity tolerance;Impaired balance (sitting and/or standing);Decreased safety awareness;Decreased knowledge of use of DME or AE;Cardiopulmonary status limiting activity;Pain      OT Treatment/Interventions: Self-care/ADL training;Therapeutic exercise;Energy conservation;DME and/or AE instruction;Therapeutic activities;Patient/family education;Balance training    OT Goals(Current goals can be found in the care plan section) Acute Rehab OT Goals Patient Stated Goal: to get better and return home  OT Goal Formulation: With patient Time For Goal Achievement: 09/24/17 Potential to Achieve Goals: Good ADL Goals Pt Will Perform Grooming: with min guard assist;standing Pt Will Perform Upper Body Bathing: with set-up;sitting Pt Will Perform Lower Body Bathing: with min guard assist;sit to/from stand Pt Will Perform Upper Body Dressing: with set-up;sitting Pt Will Perform Lower Body Dressing: with min guard assist;sit to/from stand Pt Will Transfer to Toilet: with min guard assist;ambulating;bedside commode;grab bars;regular height toilet Pt Will Perform Toileting - Clothing Manipulation and hygiene: with min guard assist;sit to/from stand Pt Will Perform Tub/Shower Transfer: Shower transfer;with min guard assist;ambulating;shower seat;rolling walker Additional ADL Goal #1: Pt will  be independent with energy conservation techniques during ADLs  OT Frequency: Min 2X/week   Barriers to D/C:            Co-evaluation PT/OT/SLP Co-Evaluation/Treatment: Yes Reason for Co-Treatment: Complexity of the patient's impairments (multi-system involvement);For patient/therapist safety   OT goals addressed during session: Strengthening/ROM      AM-PAC PT "6 Clicks" Daily Activity     Outcome Measure Help from another person eating meals?: None Help from another person taking care of personal grooming?: A Little Help from another person toileting, which includes using toliet, bedpan, or urinal?: A Lot Help from another person bathing (including washing, rinsing, drying)?: A Lot Help from another person to put on and taking off regular upper body clothing?: A Little Help from another person to put on and taking off regular lower body clothing?: Total 6 Click Score: 15   End of Session Equipment Utilized During Treatment: Oxygen Nurse Communication: Mobility status  Activity Tolerance: Treatment limited secondary to medical complications (Comment) Patient left: in bed;with call bell/phone within reach;with nursing/sitter in room  OT Visit Diagnosis: Muscle weakness (generalized) (  M62.81)                Time: 2992-4268 OT Time Calculation (min): 18 min Charges:  OT General Charges $OT Visit: 1 Visit OT Evaluation $OT Eval Moderate Complexity: 1 Mod G-Codes:     [facility], OTR/L [phone]   Lucille Passy M 09/10/2017, 1:03 PM

## 2017-09-10 NOTE — Evaluation (Signed)
Physical Therapy Evaluation Patient Details Name: [name] MRN: [id number] DOB: [birth year] Today's Date: 09/10/2017   History of Present Illness  This 72 y.o. male admitted 09/08/16 for acute false aneurysm Rt groin. He was found to have bil. infected groin incisions (s/p aortobifemoral bypass 4/17); underwent bil. groin exploration and removal of Rt and Lt limb of aortofemoral bypass and saphenous vein patch angioplasty of common femoral artery bil. as well as bil. axillary to superficial femoral artery bypasses.  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  He underwent repair of ventral incisional hernia at [facility] 07/31/17 as well as bil. groin exploration; AAA, anxiety, A-Fib with RVR; CHF, cardiomyopathy, COPD, CAD, MI, PVD, CVA, ventricular dysfunction.     Clinical Impression  Pt presents with an overall decrease in functional mobility secondary to above. PTA, ptindep and lives with family who can assist PRN. Today, pt able to sit EOB; eval limited by significant drop in BP upon sitting (see values below; RN notified). Unable to progress mobility today. SpO2 down to 84% on RA, returning to 91% on 2L O2 [name]. Pt would benefit from continued acute PT services to maximize functional mobility and independence.   Supine BP 116/60 Seated BP 71/49 Seated 2 min BO 73/57 Return to supine BP 89/53    Follow Up Recommendations Home health PT;SNF(unable to accurately determine due to limited eval)    Equipment Recommendations  Other (comment)(TBD)    Recommendations for Other Services       Precautions / Restrictions Precautions Precautions: Fall Precaution Comments: monitor BP  Restrictions Weight Bearing Restrictions: No      Mobility  Bed Mobility Overal bed mobility: Needs Assistance Bed Mobility: Supine to Sit;Sit to Supine     Supine to sit: Min guard Sit to supine: Min guard      Transfers                  General transfer comment: unable to attempt due to decreased BP sitting EOB   Ambulation/Gait                Stairs            Wheelchair Mobility    Modified Rankin (Stroke Patients Only)       Balance Overall balance assessment: Needs assistance Sitting-balance support: Feet unsupported Sitting balance-Leahy Scale: Fair                                       Pertinent Vitals/Pain Pain Assessment: Faces Faces Pain Scale: Hurts little more Pain Location: neck, groin  Pain Descriptors / Indicators: Aching;Grimacing Pain Intervention(s): Monitored during session    Home Living Family/patient expects to be discharged to:: Private residence Living Arrangements: Children;Non-relatives/Friends Available Help at Discharge: Family;Available 24 hours/day Type of Home: Mobile home Home Access: Stairs to enter   Entrance Stairs-Number of Steps: 1 Home Layout: One level Home Equipment: Shower seat      Prior Function Level of Independence: Independent         Comments: Pt reports he drives and grocery shops      Hand Dominance   Dominant Hand: Right    Extremity/Trunk Assessment   Upper Extremity Assessment Upper Extremity Assessment: Generalized weakness    Lower Extremity Assessment Lower Extremity Assessment: Generalized weakness    Cervical / Trunk Assessment Cervical / Trunk Assessment: Kyphotic  Communication  Communication: Other (comment)(low volume - difficult to understand at times)  Cognition Arousal/Alertness: Awake/alert;Lethargic Behavior During Therapy: Flat affect Overall Cognitive Status: Within Functional Limits for tasks assessed(grossly assessed)                                        General Comments General comments (skin integrity, edema, etc.): BP supine 116/60 with 02 sats 97% on 2L 02; seated BP 71/49 (55) sats 93% on RA; seated after 2 mins 73/57 sats 84% RA.  Pt returned to supine  with BP 89/53 and pt placed back on 2L 02 with sats 91%.  RN notified and in room     Exercises     Assessment/Plan    PT Assessment Patient needs continued PT services  PT Problem List Decreased strength;Decreased activity tolerance;Decreased balance;Decreased mobility;Decreased knowledge of use of DME;Cardiopulmonary status limiting activity;Pain       PT Treatment Interventions DME instruction;Gait training;Stair training;Functional mobility training;Therapeutic activities;Therapeutic exercise;Balance training;Patient/family education    PT Goals (Current goals can be found in the Care Plan section)  Acute Rehab PT Goals Patient Stated Goal: to get better and return home  PT Goal Formulation: With patient Time For Goal Achievement: 09/24/17 Potential to Achieve Goals: Good    Frequency Min 3X/week   Barriers to discharge        Co-evaluation PT/OT/SLP Co-Evaluation/Treatment: Yes Reason for Co-Treatment: Complexity of the patient's impairments (multi-system involvement);For patient/therapist safety PT goals addressed during session: Mobility/safety with mobility OT goals addressed during session: Strengthening/ROM       AM-PAC PT "6 Clicks" Daily Activity  Outcome Measure Difficulty turning over in bed (including adjusting bedclothes, sheets and blankets)?: A Little Difficulty moving from lying on back to sitting on the side of the bed? : A Little Difficulty sitting down on and standing up from a chair with arms (e.g., wheelchair, bedside commode, etc,.)?: Unable Help needed moving to and from a bed to chair (including a wheelchair)?: A Little Help needed walking in hospital room?: A Little Help needed climbing 3-5 steps with a railing? : A Little 6 Click Score: 16    End of Session   Activity Tolerance: Treatment limited secondary to medical complications (Comment)(hypotension) Patient left: in bed;with call bell/phone within reach;with nursing/sitter in  room Nurse Communication: Mobility status PT Visit Diagnosis: Other abnormalities of gait and mobility (R26.89);Muscle weakness (generalized) (M62.81)    Time: 2025-4270 PT Time Calculation (min) (ACUTE ONLY): 19 min   Charges:   PT Evaluation $PT Eval Moderate Complexity: 1 Mod     PT G Codes:       Mabeline Caras, PT, DPT Acute Rehab Services  Pager: [place] 09/10/2017, 1:54 PM

## 2017-09-10 NOTE — Care Management Note (Addendum)
Case Management Note Marvetta Gibbons RN, BSN Unit 4E-Case Manager-- [place] coverage [phone]  Patient Details  Name: [name] MRN: [id number] Date of Birth: 06-20-1946  Subjective/Objective:  Pt  [facility] for acute false aneurysm in his right groin. admitted s/p Bilateral Groin Exploration, Removal of Prosthetic graft. Vein Patch Closure of bilateral common femoral Arteries. (Bilateral) BYPASS GRAFT AXILLA-BIFEMORAL                   Action/Plan: PTA pt lived at home- PT/OT evals ordered and pending for recommendations- CM to follow for transition of care needs.  Notified by Jonelle Sidle with [facility]- pt has pre-op referral to [facility] for any [facility] needs  Expected Discharge Date:                  Expected Discharge Plan:  [place]  In-House Referral:     Discharge planning Services  CM Consult  Post Acute Care Choice:    Choice offered to:     DME Arranged:    DME Agency:     HH Arranged:    [place] Agency:     Status of Service:  In process, will continue to follow  If discussed at Long Length of Stay Meetings, dates discussed:    Discharge Disposition:   Additional Comments:  Dawayne Patricia, RN 09/10/2017, 10:06 AM

## 2017-09-10 NOTE — Progress Notes (Signed)
Patient ID: [name], male   DOB: Sep 03, 1946, 72 y.o.   MRN: [id number]          [facility] for Infectious Disease    Date of Admission:  09/08/2017    Day 2 vancomycin        Day 2 ceftazidime  He is afebrile.  Cultures from both groin sites are negative at 24 hours.  I will continue current antibiotics for now.         Michel Bickers, MD [facility] for Infectious [place] [facility] [phone] pager   [phone] cell 09/10/2017, 12:34 PM

## 2017-09-10 NOTE — Progress Notes (Signed)
Subjective: Interval History: none.. Looks good this morning.  Sitting up in chair.  Reports moderate soreness.  Objective: Vital signs in last 24 hours: Temp:  [97.5 F (36.4 C)-98.3 F (36.8 C)] 97.9 F (36.6 C) (01/03 0331) Pulse Rate:  [54-88] 76 (01/03 0600) Resp:  [10-18] 14 (01/03 0600) BP: (52-141)/(30-78) 130/63 (01/03 0600) SpO2:  [87 %-98 %] 95 % (01/03 0600)  Intake/Output from previous day: 01/02 0701 - 01/03 0700 In: 1638.8 [I.V.:1338.8; IV Piggyback:300] Out: 1080 [Urine:1075; Drains:5] Intake/Output this shift: No intake/output data recorded.  Axillary and thigh incisions healing nicely.  3+ popliteal pulses bilaterally.  Groin dressings intact.  Minimal out right groin JP.  Will discontinue.  Lab Results: Recent Labs    09/09/17 0348 09/10/17 0400  WBC 12.2* 9.7  HGB 9.1* 8.0*  HCT 28.6* 26.1*  PLT 267 273   BMET Recent Labs    09/08/17 2011 09/08/17 2129 09/09/17 0348 09/10/17 0400  NA 136 136  --  138  K 4.1 4.3  --  3.8  CL  --   --   --  106  CO2  --   --   --  28  GLUCOSE 165*  --   --  102*  BUN  --   --   --  13  CREATININE  --   --  0.68 0.60*  CALCIUM  --   --   --  7.6*    Studies/Results: Dg Chest Port 1 View  Result Date: 09/08/2017 CLINICAL DATA:  Central line placement. EXAM: PORTABLE CHEST 1 VIEW COMPARISON:  Chest radiograph performed 03/28/2017 FINDINGS: The lungs are well-aerated. Vascular congestion is noted. Increased interstitial markings may reflect minimal interstitial edema. There is no evidence of pleural effusion or pneumothorax. The cardiomediastinal silhouette is mildly enlarged. No acute osseous abnormalities are seen. A right IJ line is noted ending about the mid SVC. IMPRESSION: 1. Right IJ line noted ending about the mid SVC. 2. Vascular congestion and mild cardiomegaly. Increased interstitial markings may reflect minimal interstitial edema. Electronically Signed   By: Garald Balding M.D.   On: 09/08/2017 23:53    Anti-infectives: Anti-infectives (From admission, onward)   Start     Dose/Rate Route Frequency Ordered Stop   09/09/17 1600  vancomycin (VANCOCIN) IVPB 1000 mg/200 mL premix     1,000 mg 200 mL/hr over 60 Minutes Intravenous Every 24 hours 09/09/17 0056     09/09/17 1400  cefTAZidime (FORTAZ) 2 g in dextrose 5 % 50 mL IVPB     2 g 100 mL/hr over 30 Minutes Intravenous Every 8 hours 09/09/17 1031     09/09/17 0115  piperacillin-tazobactam (ZOSYN) IVPB 3.375 g  Status:  Discontinued     3.375 g 12.5 mL/hr over 240 Minutes Intravenous Every 8 hours 09/09/17 0044 09/09/17 1020   09/08/17 1745  piperacillin-tazobactam (ZOSYN) IVPB 3.375 g     3.375 g 100 mL/hr over 30 Minutes Intravenous To Surgery 09/08/17 1735 09/08/17 1800   09/08/17 1745  rifampin (RIFADIN) injection 600 mg  Status:  Discontinued    Comments:  Dispense vial to OR   600 mg Does not apply  Once 09/08/17 1737 09/09/17 1052   09/08/17 1729  dextrose 5 % with cefUROXime (ZINACEF) ADS Med    Comments:  Eligha Bridegroom   : cabinet override      09/08/17 1729 09/09/17 0544   09/08/17 1728  vancomycin (VANCOCIN) 1-5 GM/200ML-% IVPB    Comments:  Eligha Bridegroom   :  cabinet override      09/08/17 1728 09/09/17 0529      Assessment/Plan: s/p Procedure(s): Bilateral Groin Exploration, Removal of Prosthetic graft. Vein Patch Closure of bilateral common femoral Arteries. (Bilateral) BYPASS GRAFT AXILLA-BIFEMORAL (Bilateral) Stable overall.  Will transfer to 4 E. stepdown.  Did have some decreased blood pressure yesterday afternoon and was on low dose dopamine.  This is resolved.  Will DC dopamine drip.  Again discussed potential removal of nonfunctional intra-abdominal aortic graft with the patient.   LOS: 2 days   Markian Glockner 09/10/2017, 7:06 AM

## 2017-09-11 DIAGNOSIS — T827XXD Infection and inflammatory reaction due to other cardiac and vascular devices, implants and grafts, subsequent encounter: Secondary | ICD-10-CM

## 2017-09-11 LAB — CBC
HCT: 29.3 % — ABNORMAL LOW (ref 39.0–52.0)
HEMOGLOBIN: 8.7 g/dL — AB (ref 13.0–17.0)
MCH: 24.9 pg — ABNORMAL LOW (ref 26.0–34.0)
MCHC: 29.7 g/dL — ABNORMAL LOW (ref 30.0–36.0)
MCV: 84 fL (ref 78.0–100.0)
PLATELETS: 207 10*3/uL (ref 150–400)
RBC: 3.49 MIL/uL — AB (ref 4.22–5.81)
RDW: 18.7 % — ABNORMAL HIGH (ref 11.5–15.5)
WBC: 5.8 10*3/uL (ref 4.0–10.5)

## 2017-09-11 MED ORDER — SODIUM CHLORIDE 0.9 % IV BOLUS (SEPSIS): 500.0000 mL | Freq: Once | INTRAVENOUS | Status: AC

## 2017-09-11 NOTE — Progress Notes (Signed)
Occupational Therapy Treatment Patient Details Name: [name] MRN: [id number] DOB: Sep 19, 1945 Today's Date: 09/11/2017    History of present illness This 72 y.o. male admitted 09/08/16 for acute false aneurysm Rt groin. He was found to have bil. infected groin incisions (s/p aortobifemoral bypass 4/17); underwent bil. groin exploration and removal of Rt and Lt limb of aortofemoral bypass and saphenous vein patch angioplasty of common femoral artery bil. as well as bil. axillary to superficial femoral artery bypasses.  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  He underwent repair of ventral incisional hernia at [facility] 07/31/17 as well as bil. groin exploration; AAA, anxiety, A-Fib with RVR; CHF, cardiomyopathy, COPD, CAD, MI, PVD, CVA, ventricular dysfunction.    OT comments  Pt initially with soft BP (receiving Dopamine), therefore, performed bil. UE strengthening exercises in supine.  BP improved allowing for OOB activity - seen with PT.  He tolerated ambulating ~114ft with min A +2 - BP readings below. He compained pain 8/10 bil. Thighs/groin and generalized.  He requires max - total A for LB ADLs.  Will continue to follow.   Follow Up Recommendations  SNF;Home health OT    Equipment Recommendations  3 in 1 bedside commode    Recommendations for Other Services      Precautions / Restrictions Precautions Precautions: Fall Precaution Comments: monitor BP        Mobility Bed Mobility Overal bed mobility: Needs Assistance Bed Mobility: Supine to Sit;Sit to Supine     Supine to sit: Min guard Sit to supine: Min guard      Transfers Overall transfer level: Needs assistance Equipment used: 1 person hand held assist Transfers: Sit to/from [facility] Sit to Stand: Min assist Stand pivot transfers: Min assist       General transfer comment: assist for balance     Balance Overall balance  assessment: Needs assistance Sitting-balance support: Feet unsupported Sitting balance-Leahy Scale: Fair     Standing balance support: No upper extremity supported Standing balance-Leahy Scale: Poor Standing balance comment: frequires min A for balance intermittently                            ADL either performed or assessed with clinical judgement   ADL Overall ADL's : Needs assistance/impaired                         Toilet Transfer: Minimal assistance;Ambulation;Comfort height toilet;BSC;Grab bars;+2 for safety/equipment                   Vision       Perception     Praxis      Cognition Arousal/Alertness: Awake/alert;Lethargic Behavior During Therapy: Flat affect Overall Cognitive Status: Within Functional Limits for tasks assessed                                          Exercises Exercises: Other exercises;General Upper Extremity General Exercises - Upper Extremity Shoulder Flexion: Strengthening;Right;Left;10 reps;Supine;Theraband Theraband Level (Shoulder Flexion): Level 2 (Red) Shoulder Horizontal ABduction: Strengthening;Right;Left;10 reps;Supine;Theraband Theraband Level (Shoulder Horizontal Abduction): Level 2 (Red) Shoulder Horizontal ADduction: Strengthening;Right;Left;10 reps;Supine;Theraband Theraband Level (Shoulder Horizontal Adduction): Level 2 (Red) Elbow Flexion: Strengthening;Right;Left;10 reps;Supine;Theraband Theraband Level (Elbow Flexion): Level 2 (Red)   Shoulder Instructions  General Comments Supine BP 101/51, seated BP 114/53, standing BP 96/53    Pertinent Vitals/ Pain       Pain Assessment: 0-10 Pain Score: 8  Pain Location: generalized and bil. thighs/groin  Pain Descriptors / Indicators: Aching;Grimacing Pain Intervention(s): Monitored during session;Limited activity within patient's tolerance  Home Living                                          Prior  Functioning/Environment              Frequency  Min 2X/week        Progress Toward Goals  OT Goals(current goals can now be found in the care plan section)  Progress towards OT goals: Progressing toward goals     Plan Discharge plan remains appropriate    Co-evaluation    PT/OT/SLP Co-Evaluation/Treatment: Yes Reason for Co-Treatment: Complexity of the patient's impairments (multi-system involvement);For patient/therapist safety   OT goals addressed during session: Strengthening/ROM      AM-PAC PT "6 Clicks" Daily Activity     Outcome Measure   Help from another person eating meals?: None Help from another person taking care of personal grooming?: A Little Help from another person toileting, which includes using toliet, bedpan, or urinal?: A Lot Help from another person bathing (including washing, rinsing, drying)?: A Lot Help from another person to put on and taking off regular upper body clothing?: A Little Help from another person to put on and taking off regular lower body clothing?: Total 6 Click Score: 15    End of Session Equipment Utilized During Treatment: Oxygen  OT Visit Diagnosis: Muscle weakness (generalized) (M62.81)   Activity Tolerance Patient limited by pain   Patient Left in bed;with call bell/phone within reach   Nurse Communication Mobility status;Patient requests pain meds        Time: 7026-3785 OT Time Calculation (min): 30 min  Charges: OT General Charges $OT Visit: 1 Visit OT Treatments $Therapeutic Exercise: 8-22 mins  [facility], OTR/L [phone]    Lucille Passy M 09/11/2017, 5:07 PM

## 2017-09-11 NOTE — Progress Notes (Signed)
Patient ID: [name], male   DOB: 1946-07-01, 72 y.o.   MRN: [id number]          [facility] for Infectious Disease  Date of Admission:  09/08/2017           Day 3 vancomycin        Day 3 ceftazidime ASSESSMENT: So far operative cultures from his right groin are not showing any growth.  It appears that there may be a diphtheroids growing from his left groin operative cultures.  I favor continuing his current broad, empiric therapy to cover for staph and gram-negative rods.  He seems more agreeable with the possibility of having the old aortic graft remnant removed in the near future.  I would plan on continuing IV antibiotics at least through that procedure.  I will have my partner, Dr. Talbot Grumbling, monitor his culture results this weekend and make any changes that are indicated.  PLAN: 1. Continue current antibiotics pending final culture results 2. Recommend having PICC placed when central line is removed 3. Please call Dr. Linus Salmons [phone]) for any infectious disease questions this weekend  Principal Problem:   Infection of vascular bypass graft [facility]) Active Problems:   Postoperative groin pseudoaneurysm ([place])   PAD (peripheral artery disease) ([place])   Scheduled Meds: . amiodarone  200 mg Oral Daily  . aspirin EC  81 mg Oral Daily  . atorvastatin  80 mg Oral q1800  . Chlorhexidine Gluconate Cloth  6 each Topical Daily  . docusate sodium  100 mg Oral Daily  . escitalopram  20 mg Oral Daily  . fluticasone furoate-vilanterol  1 puff Inhalation Daily  . folic acid  1 mg Oral Daily  . heparin injection (subcutaneous)  5,000 Units Subcutaneous Q8H  . isosorbide mononitrate  15 mg Oral Daily  . mouth rinse  15 mL Mouth Rinse BID  . mirtazapine  30 mg Oral QHS  . pantoprazole  40 mg Oral Daily  . sodium chloride flush  10-40 mL Intracatheter Q12H  . thiamine  100 mg Oral Daily  . tiotropium  18 mcg Inhalation Daily  . tiZANidine  4 mg Oral QHS  . tiZANidine  8 mg  Oral Daily   Continuous Infusions: . sodium chloride    . sodium chloride    . sodium chloride 100 mL/hr at 09/11/17 0303  . cefTAZidime (FORTAZ)  IV 2 g (09/11/17 1359)  . DOPamine Stopped (09/11/17 0730)  . magnesium sulfate 1 - 4 g bolus IVPB    . vancomycin Stopped (09/10/17 1800)   PRN Meds:.acetaminophen **OR** acetaminophen, albuterol, alum & mag hydroxide-simeth, guaiFENesin-dextromethorphan, hydrALAZINE, labetalol, magnesium sulfate 1 - 4 g bolus IVPB, metoprolol tartrate, morphine injection, nitroGLYCERIN, ondansetron, oxyCODONE-acetaminophen, phenol, potassium chloride, sodium chloride flush   SUBJECTIVE: He states that he is feeling better.  Review of Systems: Review of Systems  Constitutional: Negative for chills, diaphoresis and fever.  Gastrointestinal: Negative for abdominal pain, diarrhea, nausea and vomiting.    Allergies  Allergen Reactions  . Aspirin Nausea Only  . Ibuprofen Hives    OBJECTIVE: Vitals:   09/11/17 1145 09/11/17 1148 09/11/17 1154 09/11/17 1200  BP: (!) 73/47 (!) 75/47 (!) 78/55 (!) 85/50  Pulse:  (!) 54  (!) 55  Resp: (!) 8 (!) 8 19 11   Temp:      TempSrc:      SpO2:  95%  93%  Weight:      Height:       Body mass  index is 20.11 kg/m.  Physical Exam  Constitutional: He is oriented to person, place, and time.  He appears comfortable sitting up in bed watching television.  Cardiovascular: Normal rate and regular rhythm.  No murmur heard. Pulmonary/Chest: Effort normal and breath sounds normal.  Musculoskeletal:  He has some bruising at incision sites but they seem to be healing well.  He has some serous drainage from his groin incisions.  Neurological: He is alert and oriented to person, place, and time.  Psychiatric: Mood and affect normal.    Lab Results Lab Results  Component Value Date   WBC 5.8 09/11/2017   HGB 8.7 (L) 09/11/2017   HCT 29.3 (L) 09/11/2017   MCV 84.0 09/11/2017   PLT 207 09/11/2017    Lab Results    Component Value Date   CREATININE 0.60 (L) 09/10/2017   BUN 13 09/10/2017   NA 138 09/10/2017   K 3.8 09/10/2017   CL 106 09/10/2017   CO2 28 09/10/2017    Lab Results  Component Value Date   ALT 23 03/20/2017   AST 20 03/20/2017   ALKPHOS 55 03/20/2017   BILITOT 0.5 03/20/2017     Microbiology: Recent Results (from the past 240 hour(s))  Aerobic/Anaerobic Culture (surgical/deep wound)     Status: None (Preliminary result)   Collection Time: 09/08/17  4:25 PM  Result Value Ref Range Status   Specimen Description WOUND RIGHT GROIN  Final   Special Requests PATIENT ON FOLLOWING VANCOMYCIN,ZOSYN  Final   Gram Stain   Final    RARE WBC PRESENT, PREDOMINANTLY PMN NO ORGANISMS SEEN    Culture   Final    NO GROWTH 2 DAYS NO ANAEROBES ISOLATED; CULTURE IN PROGRESS FOR 5 DAYS   Report Status PENDING  Incomplete  Aerobic/Anaerobic Culture (surgical/deep wound)     Status: None (Preliminary result)   Collection Time: 09/08/17  8:51 PM  Result Value Ref Range Status   Specimen Description WOUND LEFT GROIN  Final   Special Requests PATIENT ON FOLLOWING VANCOMYCIN,ZOSYN  Final   Gram Stain   Final    FEW WBC PRESENT, PREDOMINANTLY PMN NO ORGANISMS SEEN    Culture CULTURE REINCUBATED FOR BETTER GROWTH  Final   Report Status PENDING  Incomplete  MRSA PCR Screening     Status: None   Collection Time: 09/09/17 12:32 AM  Result Value Ref Range Status   MRSA by PCR NEGATIVE NEGATIVE Final    Comment:        The GeneXpert MRSA Assay (FDA approved for NASAL specimens only), is one component of a comprehensive MRSA colonization surveillance program. It is not intended to diagnose MRSA infection nor to guide or monitor treatment for MRSA infections.     Michel Bickers, MD [facility] for Infectious [place] [facility] [phone] pager   [phone] cell 09/11/2017, 2:05 PM

## 2017-09-11 NOTE — Progress Notes (Signed)
BP continues to remain low s/p 500cc bolus. Dr Early made aware. Dr. Donnetta Hutching came to bedside to assess patient. Orders received to restart dopamine.

## 2017-09-11 NOTE — Progress Notes (Addendum)
Progress Note    09/11/2017 10:22 AM 3 Days Post-Op  Subjective:  Sore along tunneled graft tracts.  Denies rest pain B feet.  Denies subjective fever.  When asked about removing remaining portion of ABF graft, patient states "only if I have to."   Vitals:   09/11/17 0800 09/11/17 0843  BP: (!) 116/54   Pulse: 61   Resp: 11   Temp:  97.9 F (36.6 C)  SpO2: 96%    Physical Exam: Cardiac:  RRR Lungs:  Diminished at B lung bases Incisions:  Chest incisions with local ecchymosis improving, no palpable hematoma; B groin dressings left in place, serous collection on dressing; B thigh incisions healing well; palpable and symmetrical DP pulses Abdomen:  Soft; no obvious hematoma along tunneled bypass tracts Neurologic: A&O  CBC    Component Value Date/Time   WBC 5.8 09/11/2017 0400   RBC 3.49 (L) 09/11/2017 0400   HGB 8.7 (L) 09/11/2017 0400   HGB 8.2 (L) 05/08/2017 0907   HCT 29.3 (L) 09/11/2017 0400   HCT 27.6 (L) 05/08/2017 0907   PLT 207 09/11/2017 0400   PLT 376 05/08/2017 0907   MCV 84.0 09/11/2017 0400   MCV 77 (L) 05/08/2017 0907   MCH 24.9 (L) 09/11/2017 0400   MCHC 29.7 (L) 09/11/2017 0400   RDW 18.7 (H) 09/11/2017 0400   RDW 18.2 (H) 05/08/2017 0907   LYMPHSABS 0.8 03/28/2017 2131   MONOABS 0.3 03/28/2017 2131   EOSABS 0.1 03/28/2017 2131   BASOSABS 0.0 03/28/2017 2131    BMET    Component Value Date/Time   NA 138 09/10/2017 0400   NA 141 05/08/2017 0907   K 3.8 09/10/2017 0400   CL 106 09/10/2017 0400   CO2 28 09/10/2017 0400   GLUCOSE 102 (H) 09/10/2017 0400   BUN 13 09/10/2017 0400   BUN 16 05/08/2017 0907   CREATININE 0.60 (L) 09/10/2017 0400   CREATININE 0.8 10/08/2015 1500   CALCIUM 7.6 (L) 09/10/2017 0400   GFRNONAA >60 09/10/2017 0400   GFRAA >60 09/10/2017 0400    INR    Component Value Date/Time   INR 1.04 03/20/2017 0743     Intake/Output Summary (Last 24 hours) at 09/11/2017 1022 Last data filed at 09/11/2017 0800 Gross per 24  hour  Intake 3631.61 ml  Output 1175 ml  Net 2456.61 ml     Assessment/Plan:  72 y.o. male is s/p #1 bilateral groin exploration and removal of right and left limb of aortofemoral bypass and saphenous vein patch angioplasty of common femoral artery bilaterally. #2 bilateral axillary to superficial femoral artery bypasses with 8 mm ringed Gore-Tex graft   3 Days Post-Op   Continue current IV abx regimen per ID; no growth from culture Continue dry dressing changes daily to B groins Dopamine has been weaned off; patient having some positional hypotension; continue IVF PT to treat today Ok to transfer to 4E Patient remains at high risk for infection of remaining ABF graft and is aware of risks  Dagoberto Ligas, PA-C [facility] [phone] 09/11/2017 10:22 AM  I have examined the patient, reviewed and agree with above.  Looks quite good today.  Dopamine off.  Hemodynamically stable.  Hematocrit 1:29 unit transfusion yesterday.  2-3+ dorsalis pedis pulses bilaterally.  Axillary and thigh incisions healing nicely.  Does have some serous drainage but groin incisions.  Agree with transfer to 4 E. today.  Again discussed the need for removal of the aortic component of his aortofemoral  graft.  He appears to be willing to proceed with this.  Would consider discharge and then not readmission for graft removal.  Will discuss with Dr. Bridgett Larsson as well.  Curt Jews, MD 09/11/2017 11:07 AM

## 2017-09-11 NOTE — Progress Notes (Signed)
Patient ID: [name], male   DOB: March 19, 1946, 72 y.o.   MRN: [id number] Continues to be comfortable.  Blood pressure again down in the mid 70s.  Will resume low-dose dopamine drip and continue to watch in the unit.  Continues to have dorsalis pedis palpable pulses

## 2017-09-11 NOTE — Progress Notes (Signed)
Pts SBP in the 70s at this time. Dr. Donnetta Hutching notified. Orders received to give patient 500cc bolus. Will continue to monitor.

## 2017-09-11 NOTE — Progress Notes (Signed)
Physical Therapy Treatment Patient Details Name: FIRAS GUARDADO MRN: [id number] DOB: 1945-09-19 Today's Date: 09/11/2017    History of Present Illness This 72 y.o. male admitted 09/08/16 for acute false aneurysm Rt groin. He was found to have bil. infected groin incisions (s/p aortobifemoral bypass 4/17); underwent bil. groin exploration and removal of Rt and Lt limb of aortofemoral bypass and saphenous vein patch angioplasty of common femoral artery bil. as well as bil. axillary to superficial femoral artery bypasses.  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  He underwent repair of ventral incisional hernia at [facility] 07/31/17 as well as bil. groin exploration; AAA, anxiety, A-Fib with RVR; CHF, cardiomyopathy, COPD, CAD, MI, PVD, CVA, ventricular dysfunction.    PT Comments    BP improving somewhat this afternoon allowing for OOB mobility; remains asymptomatic with soft BP. Able to ambulate 100' with minA (+2 safety); significant limited by decreased activity tolerance, BLE fatigue, and c/o BLE pain around incision sites. Pt remains limited with ability to perform ADLs. Will continue to follow acutely.    Follow Up Recommendations  Home health PT;SNF     Equipment Recommendations  (TBD)    Recommendations for Other Services       Precautions / Restrictions Precautions Precautions: Fall Precaution Comments: monitor BP  Restrictions Weight Bearing Restrictions: No    Mobility  Bed Mobility Overal bed mobility: Needs Assistance Bed Mobility: Supine to Sit;Sit to Supine     Supine to sit: Min guard Sit to supine: Min guard      Transfers Overall transfer level: Needs assistance Equipment used: 1 person hand held assist Transfers: Sit to/from Stand Sit to Stand: Min assist Stand pivot transfers: Min assist       General transfer comment: assist for balance   Ambulation/Gait Ambulation/Gait assistance: Min  assist;+2 safety/equipment Ambulation Distance (Feet): 100 Feet Assistive device: None Gait Pattern/deviations: Step-through pattern;Decreased stride length;Antalgic Gait velocity: Decreased Gait velocity interpretation: <1.8 ft/sec, indicative of risk for recurrent falls General Gait Details: Amb 100' with minA (+2 safety/BP monitoring), with 1x seated rest break due to BLE fatigue. Pt with c/o signficant BLE pain throughout mobility   Stairs            Wheelchair Mobility    Modified Rankin (Stroke Patients Only)       Balance Overall balance assessment: Needs assistance Sitting-balance support: Feet unsupported Sitting balance-Leahy Scale: Fair     Standing balance support: No upper extremity supported Standing balance-Leahy Scale: Poor Standing balance comment: frequires min A for balance intermittently                             Cognition Arousal/Alertness: Awake/alert;Lethargic Behavior During Therapy: Flat affect Overall Cognitive Status: Within Functional Limits for tasks assessed                                        Exercises General Exercises - Upper Extremity Shoulder Flexion: Strengthening;Right;Left;10 reps;Supine;Theraband Theraband Level (Shoulder Flexion): Level 2 (Red) Shoulder Horizontal ABduction: Strengthening;Right;Left;10 reps;Supine;Theraband Theraband Level (Shoulder Horizontal Abduction): Level 2 (Red) Shoulder Horizontal ADduction: Strengthening;Right;Left;10 reps;Supine;Theraband Theraband Level (Shoulder Horizontal Adduction): Level 2 (Red) Elbow Flexion: Strengthening;Right;Left;10 reps;Supine;Theraband Theraband Level (Elbow Flexion): Level 2 (Red)    General Comments General comments (skin integrity, edema, etc.): Supine BP 101/51, seated BP 114/53, standing BP 96/53  Pertinent Vitals/Pain Pain Assessment: 0-10 Pain Score: 8  Pain Location: generalized and bil. thighs/groin  Pain Descriptors /  Indicators: Aching;Grimacing Pain Intervention(s): Monitored during session;Limited activity within patient's tolerance;Patient requesting pain meds-RN notified    Home Living                      Prior Function            PT Goals (current goals can now be found in the care plan section) Acute Rehab PT Goals Patient Stated Goal: to get better and return home  PT Goal Formulation: With patient Time For Goal Achievement: 09/24/17 Potential to Achieve Goals: Good Progress towards PT goals: Progressing toward goals    Frequency    Min 3X/week      PT Plan Current plan remains appropriate    Co-evaluation PT/OT/SLP Co-Evaluation/Treatment: Yes Reason for Co-Treatment: Complexity of the patient's impairments (multi-system involvement);For patient/therapist safety PT goals addressed during session: Mobility/safety with mobility OT goals addressed during session: Strengthening/ROM      AM-PAC PT "6 Clicks" Daily Activity  Outcome Measure  Difficulty turning over in bed (including adjusting bedclothes, sheets and blankets)?: A Little Difficulty moving from lying on back to sitting on the side of the bed? : A Little Difficulty sitting down on and standing up from a chair with arms (e.g., wheelchair, bedside commode, etc,.)?: A Little Help needed moving to and from a bed to chair (including a wheelchair)?: A Little Help needed walking in hospital room?: A Little Help needed climbing 3-5 steps with a railing? : A Little 6 Click Score: 18    End of Session Equipment Utilized During Treatment: Gait belt;Oxygen Activity Tolerance: Patient tolerated treatment well;Patient limited by fatigue;Patient limited by pain Patient left: in bed;with call bell/phone within reach Nurse Communication: Mobility status PT Visit Diagnosis: Other abnormalities of gait and mobility (R26.89);Muscle weakness (generalized) (M62.81)     Time: 1683-7290 PT Time Calculation (min) (ACUTE  ONLY): 25 min  Charges:  $Gait Training: 8-22 mins                    G Codes:      Mabeline Caras, PT, DPT Acute Rehab Services  Pager: [place] 09/11/2017, 5:24 PM

## 2017-09-12 LAB — TYPE AND SCREEN
ABO/RH(D): O POS
ANTIBODY SCREEN: NEGATIVE
UNIT DIVISION: 0
Unit division: 0
Unit division: 0
Unit division: 0
Unit division: 0
Unit division: 0
Unit division: 0
Unit division: 0

## 2017-09-12 LAB — BPAM RBC
BLOOD PRODUCT EXPIRATION DATE: 201901292359
BLOOD PRODUCT EXPIRATION DATE: 201901292359
BLOOD PRODUCT EXPIRATION DATE: 201901312359
Blood Product Expiration Date: 201901292359
Blood Product Expiration Date: 201901292359
Blood Product Expiration Date: 201901292359
Blood Product Expiration Date: 201901312359
Blood Product Expiration Date: 201901312359
ISSUE DATE / TIME: 201901011737
ISSUE DATE / TIME: 201901011737
ISSUE DATE / TIME: 201901020614
ISSUE DATE / TIME: 201901020954
ISSUE DATE / TIME: 201901031253
UNIT TYPE AND RH: 5100
UNIT TYPE AND RH: 5100
UNIT TYPE AND RH: 5100
Unit Type and Rh: 5100
Unit Type and Rh: 5100
Unit Type and Rh: 5100
Unit Type and Rh: 5100
Unit Type and Rh: 5100

## 2017-09-12 MED ORDER — ISOSORBIDE MONONITRATE ER 30 MG PO TB24
15.0000 mg | ORAL_TABLET | Freq: Every day | ORAL | Status: DC
  Administered 2017-09-12 – 2017-09-16 (×5): 15 mg via ORAL
  Filled 2017-09-12 (×4): qty 1

## 2017-09-12 NOTE — Progress Notes (Signed)
Pharmacy Antibiotic Note  [name] is a 72 y.o. male admitted on 09/08/2017 with aortic bifemoral graft infection. Pharmacy has been consulted for vancomycin and ceftazidime dosing. 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 Patient is s/p removal of right and left limb of aortofemoral bypass on 1/1. Culture from L groin growing diphtheroids. Right groin cultures are not showing any growth.   Plan: Continue vancomycin 1g IV q 24 hrs.  Will check vancomycin trough level before dose tomorrow. Ceftazidime 2 gm every 8 hours  Monitor cultures, renal function , clinical s/sx of infection  Height: 5\' 6"  (167.6 cm) Weight: 124 lb 9.6 oz (56.5 kg) IBW/kg (Calculated) : 63.8  Temp (24hrs), Avg:98.1 F (36.7 C), Min:97.9 F (36.6 C), Max:98.3 F (36.8 C)  Recent Labs  Lab 09/08/17 1650 09/09/17 0348 09/10/17 0400 09/11/17 0400  WBC 7.8 12.2* 9.7 5.8  CREATININE  --  0.68 0.60*  --     Estimated Creatinine Clearance: 68.7 mL/min (A) (by C-G formula based on SCr of 0.6 mg/dL (L)).    Allergies  Allergen Reactions  . Aspirin Nausea Only  . Ibuprofen Hives    Antimicrobials this admission: 1/1 Vanc>> 1/1 Zosyn >>1/2 1/2 Ceftaz>>   Microbiology results: 1/1 Right groin wound: Pending 1/1 Left groin wound: wound- rare diphtheroids (corynebacterium) 1/2  MRSA PCR: Negative   Thank you for allowing pharmacy to be a part of this patient's care.  Uvaldo Rising, BCPS  Clinical Pharmacist Pager [phone]  09/12/2017 11:01 AM

## 2017-09-12 NOTE — Progress Notes (Signed)
Received patient in  Room [phone]  From the 2 heart unit,  Alert & oriented,  Afebrile 97.6   Sinus Brady 48 No complaints of pain or breathing, O2 at 2L/M   sats 100%    IV  NS  At [facility],  Daughter with patient  Mervyn Skeeters, RN

## 2017-09-12 NOTE — Progress Notes (Signed)
Weight adjusted in IV pump to weight ordered for Dopamine 56.5 kg infusing at 1 mL.  Prior to this pump was programmed at >70 kg infusing at 1.3 mL.  RN changed pump, and then turned off pump.

## 2017-09-12 NOTE — Progress Notes (Signed)
   VASCULAR SURGERY ASSESSMENT & PLAN:   4 Days Post-Op s/p: Removal of infected limbs of aortofemoral bypass graft.  ID: He is afebrile and his white blood cell count is 5.8.  He continues IV Fortaz and vancomycin.  His blood pressure has been stable but will hold his Imdur as this seems to be contributing to his hypotension during the day.  Dr. Bridgett Larsson had discussed possibly removing the remainder of his graft on Tuesday.  The patient at this point is not sure that he wants to proceed on Tuesday.  I will let him discuss this with Dr. Bridgett Larsson.  SUBJECTIVE:   No complaints.  PHYSICAL EXAM:   Vitals:   09/12/17 0515 09/12/17 0530 09/12/17 0545 09/12/17 0600  BP: (!) 101/56 (!) 99/53 (!) 130/58 129/61  Pulse: (!) 53 65 (!) 57 (!) 59  Resp: 14 17 14 12   Temp:      TempSrc:      SpO2: 96% (!) 88% 91% (!) 89%  Weight:      Height:       Groin dressings are dry. The remainder of his incisions look good. Palpable dorsalis pedis pulses.  LABS:   Lab Results  Component Value Date   WBC 5.8 09/11/2017   HGB 8.7 (L) 09/11/2017   HCT 29.3 (L) 09/11/2017   MCV 84.0 09/11/2017   PLT 207 09/11/2017   Lab Results  Component Value Date   CREATININE 0.60 (L) 09/10/2017   Lab Results  Component Value Date   INR 1.04 03/20/2017    PROBLEM LIST:    Principal Problem:   Infection of vascular bypass graft (HCC) Active Problems:   Postoperative groin pseudoaneurysm (HCC)   PAD (peripheral artery disease) (HCC)   CURRENT MEDS:   . amiodarone  200 mg Oral Daily  . aspirin EC  81 mg Oral Daily  . atorvastatin  80 mg Oral q1800  . Chlorhexidine Gluconate Cloth  6 each Topical Daily  . docusate sodium  100 mg Oral Daily  . escitalopram  20 mg Oral Daily  . fluticasone furoate-vilanterol  1 puff Inhalation Daily  . folic acid  1 mg Oral Daily  . heparin injection (subcutaneous)  5,000 Units Subcutaneous Q8H  . isosorbide mononitrate  15 mg Oral Daily  . mouth rinse  15 mL Mouth  Rinse BID  . mirtazapine  30 mg Oral QHS  . pantoprazole  40 mg Oral Daily  . sodium chloride flush  10-40 mL Intracatheter Q12H  . thiamine  100 mg Oral Daily  . tiotropium  18 mcg Inhalation Daily  . tiZANidine  4 mg Oral QHS  . tiZANidine  8 mg Oral Daily    Deitra Mayo Beeper: [phone] Office: [phone] 09/12/2017

## 2017-09-13 LAB — VANCOMYCIN, TROUGH: Vancomycin Tr: 7 ug/mL — ABNORMAL LOW (ref 15–20)

## 2017-09-13 MED ORDER — VANCOMYCIN HCL IN DEXTROSE 1-5 GM/200ML-% IV SOLN
1000.0000 mg | Freq: Two times a day (BID) | INTRAVENOUS | Status: DC
  Administered 2017-09-13 – 2017-09-15 (×4): 1000 mg via INTRAVENOUS
  Filled 2017-09-13 (×5): qty 200

## 2017-09-13 NOTE — Progress Notes (Signed)
Pharmacy Antibiotic Note [name] is a 72 y.o. male admitted on 09/08/2017 with aortic bifemoral graft infection. Pharmacy following for vancomycin and ceftazidime dosing. 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 Patient is s/p removal of right and left limb of aortofemoral bypass on 1/1. Culture from L groin growing diphtheroids. Right groin cultures are not showing any growth.   Vancomycin trough this evening of 7 is below desired range of 15-20 based on indication. SCr from 1/3 stable.   Plan: 1. Adjust vancomycin to 1000 mg IV every 12 hours  2. Continue Ceftazidime 2 grams IV every 8 hours  3. BMP in am to assess renal function    Height: 5\' 6"  (167.6 cm) Weight: 124 lb 9.6 oz (56.5 kg) IBW/kg (Calculated) : 63.8  Temp (24hrs), Avg:98.3 F (36.8 C), Min:97.8 F (36.6 C), Max:98.9 F (37.2 C)  Recent Labs  Lab 09/08/17 1650 09/09/17 0348 09/10/17 0400 09/11/17 0400 09/13/17 1511  WBC 7.8 12.2* 9.7 5.8  --   CREATININE  --  0.68 0.60*  --   --   VANCOTROUGH  --   --   --   --  7*     Allergies  Allergen Reactions  . Aspirin Nausea Only  . Ibuprofen Hives    Antimicrobials this admission:  1/1 Vancomycin>>  1/2 Ceftazidime >> 1/1 Zosyn >>1/2   Microbiology results: 1/1 Right groin wound: Pending 1/1 Left groin wound: wound- rare diphtheroids (corynebacterium) 1/2  MRSA PCR: Negative   Thank you for allowing pharmacy to be a part of this patient's care.  Vincenza Hews, PharmD, BCPS 09/13/2017, 4:53 PM

## 2017-09-13 NOTE — Progress Notes (Signed)
Pt ambulated approx 150 ft using RW, with assist to stand from bed.   Walked on RA, SPO2 on return 88%.  Per daughter this is baseline for Pt.  Will con't plan of care

## 2017-09-13 NOTE — Progress Notes (Signed)
   VASCULAR SURGERY ASSESSMENT & PLAN:   5 Days Post-Op s/p: Removal of infected limbs of aorto bifemoral bypass graft.  Dr. Geryl Councilman to discuss possibly removing the remainder of the graft on Tuesday.  Patient is undecided if he wants to proceed "that early."   ID: He remains afebrile.  He continues IV Fortaz and vancomycin.  His blood pressure is under better control.  He was having problems with hypotension late last week.  I have ordered a follow-up CBC for tomorrow.  SUBJECTIVE:   No complaints.  PHYSICAL EXAM:   Vitals:   09/12/17 1345 09/12/17 2036 09/13/17 0018 09/13/17 0439  BP: (!) 94/51 (!) 97/49 (!) 99/52 120/64  Pulse: (!) 47 (!) 57 (!) 56 (!) 55  Resp: 14 13 20 11   Temp: 97.6 F (36.4 C) 97.8 F (36.6 C) 98.9 F (37.2 C) 98.2 F (36.8 C)  TempSrc: Oral Oral Oral Oral  SpO2: 100% 96% 98% 98%  Weight:      Height:       Abdomen soft and nontender. Lungs clear. Some serous drainage from right groin. The remainder of his incisions look fine.  LABS:   Lab Results  Component Value Date   WBC 5.8 09/11/2017   HGB 8.7 (L) 09/11/2017   HCT 29.3 (L) 09/11/2017   MCV 84.0 09/11/2017   PLT 207 09/11/2017   Lab Results  Component Value Date   CREATININE 0.60 (L) 09/10/2017   Lab Results  Component Value Date   INR 1.04 03/20/2017   CBG (last 3)  No results for input(s): GLUCAP in the last 72 hours.  PROBLEM LIST:    Principal Problem:   Infection of vascular bypass graft (HCC) Active Problems:   Postoperative groin pseudoaneurysm (HCC)   PAD (peripheral artery disease) (HCC)   CURRENT MEDS:   . amiodarone  200 mg Oral Daily  . aspirin EC  81 mg Oral Daily  . atorvastatin  80 mg Oral q1800  . Chlorhexidine Gluconate Cloth  6 each Topical Daily  . docusate sodium  100 mg Oral Daily  . escitalopram  20 mg Oral Daily  . fluticasone furoate-vilanterol  1 puff Inhalation Daily  . folic acid  1 mg Oral Daily  . heparin injection (subcutaneous)  5,000  Units Subcutaneous Q8H  . isosorbide mononitrate  15 mg Oral Daily  . mouth rinse  15 mL Mouth Rinse BID  . mirtazapine  30 mg Oral QHS  . pantoprazole  40 mg Oral Daily  . sodium chloride flush  10-40 mL Intracatheter Q12H  . thiamine  100 mg Oral Daily  . tiotropium  18 mcg Inhalation Daily  . tiZANidine  4 mg Oral QHS  . tiZANidine  8 mg Oral Daily    Deitra Mayo Beeper: [phone] Office: [phone] 09/13/2017

## 2017-09-14 LAB — BASIC METABOLIC PANEL
ANION GAP: 5 (ref 5–15)
BUN: 9 mg/dL (ref 6–20)
CHLORIDE: 103 mmol/L (ref 101–111)
CO2: 32 mmol/L (ref 22–32)
Calcium: 7.8 mg/dL — ABNORMAL LOW (ref 8.9–10.3)
Creatinine, Ser: 0.62 mg/dL (ref 0.61–1.24)
GFR calc non Af Amer: >60 mL/min (ref >60)
Glucose, Bld: 110 mg/dL — ABNORMAL HIGH (ref 65–99)
POTASSIUM: 3.3 mmol/L — AB (ref 3.5–5.1)
SODIUM: 140 mmol/L (ref 135–145)

## 2017-09-14 LAB — AEROBIC/ANAEROBIC CULTURE (SURGICAL/DEEP WOUND): CULTURE: NO GROWTH

## 2017-09-14 LAB — CBC
HCT: 27.1 % — ABNORMAL LOW (ref 39.0–52.0)
HEMOGLOBIN: 7.9 g/dL — AB (ref 13.0–17.0)
MCH: 24.8 pg — ABNORMAL LOW (ref 26.0–34.0)
MCHC: 29.2 g/dL — ABNORMAL LOW (ref 30.0–36.0)
MCV: 85.2 fL (ref 78.0–100.0)
Platelets: 258 10*3/uL (ref 150–400)
RBC: 3.18 MIL/uL — AB (ref 4.22–5.81)
RDW: 18.8 % — ABNORMAL HIGH (ref 11.5–15.5)
WBC: 4.2 10*3/uL (ref 4.0–10.5)

## 2017-09-14 LAB — AEROBIC/ANAEROBIC CULTURE W GRAM STAIN (SURGICAL/DEEP WOUND)

## 2017-09-14 MED ORDER — POTASSIUM CHLORIDE CRYS ER 20 MEQ PO TBCR
40.0000 meq | EXTENDED_RELEASE_TABLET | Freq: Every day | ORAL | Status: DC
  Administered 2017-09-14 – 2017-09-16 (×3): 40 meq via ORAL
  Filled 2017-09-14 (×4): qty 4
  Filled 2017-09-14: qty 2

## 2017-09-14 NOTE — Care Management Important Message (Signed)
Important Message  Patient Details  Name: [name] MRN: [id number] Date of Birth: 1946-06-05   Medicare Important Message Given:  Yes    Freddie Dymek Abena 09/14/2017, 9:02 AM

## 2017-09-14 NOTE — Progress Notes (Signed)
Patient ID: [name], male   DOB: 21-May-1946, 72 y.o.   MRN: [id number]          [facility]  Date of Admission:  09/08/2017           Day 6 vancomycin        Day 6 ceftazidime ASSESSMENT: He had strong clinical evidence of infection in both groins involving the aortobifemoral limbs of his graft when he was admitted.  Right groin cultures are negative.  Left groin cultures have grown only diphtheroids.  Even with this unimpressive microbiologic data I would normally recommend continuing empiric IV antibiotic therapy for 4-6 weeks and converting to a long-term, suppressive oral regimen.  He does not want a PICC placed.  He said that he would like to talk this over with his daughter but feels like he has pretty much made his mind up.  I told him that converting to oral antibiotics now could pose risk of the infection flaring up and causing life-threatening emergencies.  If his decision to not have a PICC placed remains firm I will recommend an oral antibiotic option.  PLAN: 1. Continue current antibiotics for now 2. 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 amiodarone  200 mg Oral Daily  . aspirin EC  81 mg Oral Daily  . atorvastatin  80 mg Oral q1800  . Chlorhexidine Gluconate Cloth  6 each Topical Daily  . docusate sodium  100 mg Oral Daily  . escitalopram  20 mg Oral Daily  . fluticasone furoate-vilanterol  1 puff Inhalation Daily  . folic acid  1 mg Oral Daily  . heparin injection (subcutaneous)  5,000 Units Subcutaneous Q8H  . isosorbide mononitrate  15 mg Oral Daily  . mouth rinse  15 mL Mouth Rinse BID  . mirtazapine  30 mg Oral QHS  . pantoprazole  40 mg Oral Daily  . potassium chloride  40 mEq Oral Daily  . sodium chloride  flush  10-40 mL Intracatheter Q12H  . thiamine  100 mg Oral Daily  . tiotropium  18 mcg Inhalation Daily  . tiZANidine  4 mg Oral QHS  . tiZANidine  8 mg Oral Daily   Continuous Infusions: . sodium chloride    . sodium chloride    . sodium chloride 100 mL/hr at 09/11/17 2028  . cefTAZidime (FORTAZ)  IV 2 g (09/14/17 1328)  . DOPamine Stopped (09/12/17 0044)  . magnesium sulfate 1 - 4 g bolus IVPB    . vancomycin Stopped (09/14/17 0527)   PRN Meds:.acetaminophen **OR** acetaminophen, albuterol, alum & mag hydroxide-simeth, guaiFENesin-dextromethorphan, hydrALAZINE, labetalol, magnesium sulfate 1 - 4 g bolus IVPB, metoprolol tartrate, morphine injection, nitroGLYCERIN, ondansetron, oxyCODONE-acetaminophen, phenol, potassium chloride, sodium chloride flush   SUBJECTIVE: [name] tells me that he is only "half here".  He is completely worn out by his recent medical problems.  He does not want a PICC placed and does not want to go to a skilled nursing facility.  He tells me that he "does not want anything sticking out of him".  Review of Systems: Review of Systems  Constitutional: Positive for malaise/fatigue. Negative for chills, diaphoresis and fever.  Gastrointestinal: Negative for abdominal pain, diarrhea, nausea and vomiting.    Allergies  Allergen Reactions  .  Aspirin Nausea Only  . Ibuprofen Hives    OBJECTIVE: Vitals:   09/14/17 0051 09/14/17 0055 09/14/17 0800 09/14/17 1159  BP: 118/61  (!) 149/80 103/61  Pulse: (!) 53  74 (!) 53  Resp: 17  20 12   Temp:  98.9 F (37.2 C) 98.4 F (36.9 C) 99 F (37.2 C)  TempSrc:  Oral Oral Oral  SpO2: 94%   94%  Weight:      Height:       Body mass index is 20.11 kg/m.  Physical Exam  Constitutional: He is oriented to person, place, and time.  He is sitting up in bed talking on the phone.  Musculoskeletal:  His incisions are healing well.  Neurological: He is alert and oriented to person, place, and time.  Psychiatric:    Flat affect.  He is in no distress.    Lab Results Lab Results  Component Value Date   WBC 4.2 09/14/2017   HGB 7.9 (L) 09/14/2017   HCT 27.1 (L) 09/14/2017   MCV 85.2 09/14/2017   PLT 258 09/14/2017    Lab Results  Component Value Date   CREATININE 0.62 09/14/2017   BUN 9 09/14/2017   NA 140 09/14/2017   K 3.3 (L) 09/14/2017   CL 103 09/14/2017   CO2 32 09/14/2017    Lab Results  Component Value Date   ALT 23 03/20/2017   AST 20 03/20/2017   ALKPHOS 55 03/20/2017   BILITOT 0.5 03/20/2017     Microbiology: Recent Results (from the past 240 hour(s))  Aerobic/Anaerobic Culture (surgical/deep wound)     Status: None   Collection Time: 09/08/17  4:25 PM  Result Value Ref Range Status   Specimen Description WOUND RIGHT GROIN  Final   Special Requests PATIENT ON FOLLOWING VANCOMYCIN,ZOSYN  Final   Gram Stain   Final    RARE WBC PRESENT, PREDOMINANTLY PMN NO ORGANISMS SEEN    Culture No growth aerobically or anaerobically.  Final   Report Status 09/14/2017 FINAL  Final  Aerobic/Anaerobic Culture (surgical/deep wound)     Status: None   Collection Time: 09/08/17  8:51 PM  Result Value Ref Range Status   Specimen Description WOUND LEFT GROIN  Final   Special Requests PATIENT ON FOLLOWING VANCOMYCIN,ZOSYN  Final   Gram Stain   Final    FEW WBC PRESENT, PREDOMINANTLY PMN NO ORGANISMS SEEN    Culture   Final    RARE DIPHTHEROIDS(CORYNEBACTERIUM SPECIES) Standardized susceptibility testing for this organism is not available. NO ANAEROBES ISOLATED    Report Status 09/14/2017 FINAL  Final  MRSA PCR Screening     Status: None   Collection Time: 09/09/17 12:32 AM  Result Value Ref Range Status   MRSA by PCR NEGATIVE NEGATIVE Final    Comment:        The GeneXpert MRSA Assay (FDA approved for NASAL specimens only), is one component of a comprehensive MRSA colonization surveillance program. It is not intended to diagnose MRSA infection nor to guide or monitor  treatment for MRSA infections.     Michel Bickers, MD [facility] [place] [facility] [phone] pager   [phone] cell 09/14/2017, 3:05 PM

## 2017-09-14 NOTE — Clinical Social Work Note (Signed)
CSW responding to consult for SNF. At this time pt is walking 300 ft. Per RNCM pt doesn't want SNF. Pt may have to have further surgery. Clinical Social Worker will sign off for now as social work intervention is no longer needed. Please consult [place] again if new need arises.   Olson, Daniel

## 2017-09-14 NOTE — Progress Notes (Addendum)
Progress Note    09/14/2017 7:33 AM 6 Days Post-Op  Subjective:  No new complaints   Vitals:   09/14/17 0051 09/14/17 0055  BP: 118/61   Pulse: (!) 53   Resp: 17   Temp:  98.9 F (37.2 C)  SpO2: 94%    Physical Exam: Cardiac:  RRR Lungs:  Non-labored Incisions:  B chest incisions healing well; local ecchymosis slowly improving; R groin incision with thin, yellow collection on dressing; L groin incision dry and intact; B thigh incisions healing well Extremities:  Palpable B DP pulses Abdomen:  Soft Neurologic: A&O  CBC    Component Value Date/Time   WBC 4.2 09/14/2017 0223   RBC 3.18 (L) 09/14/2017 0223   HGB 7.9 (L) 09/14/2017 0223   HGB 8.2 (L) 05/08/2017 0907   HCT 27.1 (L) 09/14/2017 0223   HCT 27.6 (L) 05/08/2017 0907   PLT 258 09/14/2017 0223   PLT 376 05/08/2017 0907   MCV 85.2 09/14/2017 0223   MCV 77 (L) 05/08/2017 0907   MCH 24.8 (L) 09/14/2017 0223   MCHC 29.2 (L) 09/14/2017 0223   RDW 18.8 (H) 09/14/2017 0223   RDW 18.2 (H) 05/08/2017 0907   LYMPHSABS 0.8 03/28/2017 2131   MONOABS 0.3 03/28/2017 2131   EOSABS 0.1 03/28/2017 2131   BASOSABS 0.0 03/28/2017 2131    BMET    Component Value Date/Time   NA 140 09/14/2017 0223   NA 141 05/08/2017 0907   K 3.3 (L) 09/14/2017 0223   CL 103 09/14/2017 0223   CO2 32 09/14/2017 0223   GLUCOSE 110 (H) 09/14/2017 0223   BUN 9 09/14/2017 0223   BUN 16 05/08/2017 0907   CREATININE 0.62 09/14/2017 0223   CREATININE 0.8 10/08/2015 1500   CALCIUM 7.8 (L) 09/14/2017 0223   GFRNONAA >60 09/14/2017 0223   GFRAA >60 09/14/2017 0223    INR    Component Value Date/Time   INR 1.04 03/20/2017 0743     Intake/Output Summary (Last 24 hours) at 09/14/2017 0733 Last data filed at 09/14/2017 0600 Gross per 24 hour  Intake 800 ml  Output 2050 ml  Net -1250 ml     Assessment/Plan:  72 y.o. male is s/p #1 bilateral groin exploration and removal of right and left limb of aortofemoral bypass and saphenous vein  patch angioplasty of common femoral artery bilaterally. #2 bilateral axillary to superficial femoral artery bypasses with 8 mm ringed Gore-Tex graft   6 Days Post-Op   K replaced; recheck BMP tomorrow am Continue IV antibiotics per ID Change B groin dry dressings daily and prn; some persistent serous/lymphatic collection on R groin dressing Patient is wanting to hold off on removing remaining ABF graft for now; he was again made aware of risks and high risk of infection involving remainder of ABF conduit; will defer to Dr. Bridgett Larsson for further discussion of this with patient Hgb 7.9; consider transfusion, recheck CBC in am   DVT prophylaxis:  subq heparin   Daniel Ligas, [name] [facility] [phone] 09/14/2017 7:33 AM  Addendum  I have independently interviewed and examined the patient, and I agree with the physician assistant's findings.  I reviewed this patient's electronic chart including [facility] records since July 2019.  It remains unclear to me the source of this patient's infection of the aortobifemoral bypass graft.  There is a time frame where the patient was admitted with bacteremia and hypotension, which is suspicious for possible seeding of the graft.  He  was seen in my office in 04/22/17 and on aortoiliac duplex he did not have any fluid collections around the graft.  Sometime between that appointment and seeing General Surgery, the patient developed what was likely infection of the aortobifemoral limbs.  Reviewing the intra-abdominal findings of the laparoscopic procedures did not find evidence of frank infection but the limbs are in the retroperitoneum so I doubt it would have been visible.  So far B wound cultures have not grown any bacteria (one more day before should be back).  - I discussed with the patient my concerns with possible infection of the main body of the aortobifemoral graft and the potential life-threatening emergency from development of  mycotic proximal pseudoaneurysm.   - I discussed with him that aortic wall compromise in the event of infection could be life-threatening and could lead to acute permanent renal failure if the perirenal segment has to be ligated due to aortitis due to proximal extension of infection from the graft. - There are risks from deferring removal of the graft and there are risks from proceeding given his recent LOA and high risk cardiac catheterization findings. - At this point, pt would like to delay proceeding. - Will find out from ID what abx they recommend. - May need PICC to facilitate this.   Adele Barthel, MD, FACS [facility] [place] Office: [phone] Pager: [phone]  09/14/2017, 1:29 PM    Addendum  I had a long discussion with the pt's daughter in regards to the multiple issues in process.  She emphasized he really wants to go home.  We discussed PICC placement and IV abx and frequent outpatient follow up.  - I discussed PICC placement with the patient.  He emphatically REFUSES PICC placement. - Will need to see if ID can formulate a PO regimen given patient refusal to allow PICC placement.  Adele Barthel, MD, FACS [facility] [place] Office: [phone] Pager: [phone]  09/14/2017, 2:07 PM

## 2017-09-14 NOTE — Progress Notes (Signed)
Physical Therapy Treatment Patient Details Name: [name] MRN: [id number] DOB: 1946/04/16 Today's Date: 09/14/2017    History of Present Illness This 72 y.o. male admitted 09/08/16 for acute false aneurysm Rt groin. He was found to have bil. infected groin incisions (s/p aortobifemoral bypass 4/17); underwent bil. groin exploration and removal of Rt and Lt limb of aortofemoral bypass and saphenous vein patch angioplasty of common femoral artery bil. as well as bil. axillary to superficial femoral artery bypasses.  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  He underwent repair of ventral incisional hernia at [facility] 07/31/17 as well as bil. groin exploration; AAA, anxiety, A-Fib with RVR; CHF, cardiomyopathy, COPD, CAD, MI, PVD, CVA, ventricular dysfunction.     PT Comments    Pt admitted with above diagnosis. Pt currently with functional limitations due to the deficits listed below (see PT Problem List). Pt was abl eto ambulate in hallway with RW with good stability.  Did have to have BM and had large BM in 3N1 after walk.  Desat to 85% on RA with activity but back to 90% after walk on RA.  Will follow acutely.  Pt will benefit from skilled PT to increase their independence and safety with mobility to allow discharge to the venue listed below.     Follow Up Recommendations  Home health PT;SNF     Equipment Recommendations  Rolling walker with 5" wheels    Recommendations for Other Services       Precautions / Restrictions Precautions Precautions: Fall Precaution Comments: monitor BP  Restrictions Weight Bearing Restrictions: No    Mobility  Bed Mobility Overal bed mobility: Needs Assistance Bed Mobility: Supine to Sit;Sit to Supine     Supine to sit: Min guard Sit to supine: Min guard      Transfers Overall transfer level: Needs assistance Equipment used: Rolling walker (2 wheeled) Transfers: Sit to/from  Stand Sit to Stand: Min guard            Ambulation/Gait Ambulation/Gait assistance: Min guard Ambulation Distance (Feet): 300 Feet Assistive device: Rolling walker (2 wheeled) Gait Pattern/deviations: Step-through pattern;Decreased stride length Gait velocity: Decreased Gait velocity interpretation: <1.8 ft/sec, indicative of risk for recurrent falls General Gait Details: Amb with no LOB.  Did well with RW.    Stairs            Wheelchair Mobility    Modified Rankin (Stroke Patients Only)       Balance Overall balance assessment: Needs assistance Sitting-balance support: Feet unsupported Sitting balance-Leahy Scale: Fair     Standing balance support: During functional activity;Bilateral upper extremity supported Standing balance-Leahy Scale: Poor Standing balance comment: No LOB but does require UE support for balance in standing                             Cognition Arousal/Alertness: Awake/alert Behavior During Therapy: Flat affect Overall Cognitive Status: Within Functional Limits for tasks assessed                                        Exercises      General Comments General comments (skin integrity, edema, etc.): Pt sats 90-92% at rest on RA.  Desat to 85-90% after walk but back to 88-92% after walk.        Pertinent Vitals/Pain Pain Assessment:  No/denies pain    Home Living                      Prior Function            PT Goals (current goals can now be found in the care plan section) Acute Rehab PT Goals Patient Stated Goal: to get better and return home  Progress towards PT goals: Progressing toward goals    Frequency    Min 3X/week      PT Plan Current plan remains appropriate    Co-evaluation              AM-PAC PT "6 Clicks" Daily Activity  Outcome Measure  Difficulty turning over in bed (including adjusting bedclothes, sheets and blankets)?: A Little Difficulty moving from  lying on back to sitting on the side of the bed? : A Little Difficulty sitting down on and standing up from a chair with arms (e.g., wheelchair, bedside commode, etc,.)?: A Little Help needed moving to and from a bed to chair (including a wheelchair)?: A Little Help needed walking in hospital room?: A Little Help needed climbing 3-5 steps with a railing? : A Little 6 Click Score: 18    End of Session Equipment Utilized During Treatment: Gait belt Activity Tolerance: Patient limited by fatigue Patient left: in bed;with call bell/phone within reach Nurse Communication: Mobility status PT Visit Diagnosis: Other abnormalities of gait and mobility (R26.89);Muscle weakness (generalized) (M62.81)     Time: 7829-5621 PT Time Calculation (min) (ACUTE ONLY): 16 min  Charges:  $Gait Training: 8-22 mins                    G Codes:       [name],PT Acute Rehabilitation [phone] (pager)    [name] 09/14/2017, 9:50 AM

## 2017-09-15 ENCOUNTER — Encounter (HOSPITAL_COMMUNITY)
Admission: RE | Disposition: A | Discharge status: Home Health | Payer: Self-pay | Source: Other Acute Inpatient Hospital | Attending: Vascular Surgery

## 2017-09-15 LAB — CBC
HEMATOCRIT: 25.4 % — AB (ref 39.0–52.0)
Hemoglobin: 7.6 g/dL — ABNORMAL LOW (ref 13.0–17.0)
MCH: 25.4 pg — ABNORMAL LOW (ref 26.0–34.0)
MCHC: 29.9 g/dL — ABNORMAL LOW (ref 30.0–36.0)
MCV: 84.9 fL (ref 78.0–100.0)
PLATELETS: 273 10*3/uL (ref 150–400)
RBC: 2.99 MIL/uL — AB (ref 4.22–5.81)
RDW: 19.4 % — ABNORMAL HIGH (ref 11.5–15.5)
WBC: 4.7 10*3/uL (ref 4.0–10.5)

## 2017-09-15 LAB — BASIC METABOLIC PANEL
Anion gap: 7 (ref 5–15)
BUN: 11 mg/dL (ref 6–20)
CHLORIDE: 104 mmol/L (ref 101–111)
CO2: 28 mmol/L (ref 22–32)
Calcium: 7.9 mg/dL — ABNORMAL LOW (ref 8.9–10.3)
Creatinine, Ser: 0.62 mg/dL (ref 0.61–1.24)
Glucose, Bld: 87 mg/dL (ref 65–99)
POTASSIUM: 3.8 mmol/L (ref 3.5–5.1)
SODIUM: 139 mmol/L (ref 135–145)

## 2017-09-15 LAB — PREPARE RBC (CROSSMATCH)

## 2017-09-15 SURGERY — REVISION OF AORTA BIFEMORAL BYPASS
Anesthesia: General

## 2017-09-15 MED ORDER — DOXYCYCLINE HYCLATE 100 MG PO TABS
100.0000 mg | ORAL_TABLET | Freq: Two times a day (BID) | ORAL | Status: DC
  Administered 2017-09-15 – 2017-09-16 (×2): 100 mg via ORAL
  Filled 2017-09-15 (×2): qty 1

## 2017-09-15 MED ORDER — CEFUROXIME AXETIL 500 MG PO TABS
500.0000 mg | ORAL_TABLET | Freq: Two times a day (BID) | ORAL | Status: DC
  Administered 2017-09-15 – 2017-09-16 (×2): 500 mg via ORAL
  Filled 2017-09-15 (×2): qty 1

## 2017-09-15 MED ORDER — RIVAROXABAN 20 MG PO TABS: 20.0000 mg | ORAL_TABLET | Freq: Every day | ORAL | Status: DC

## 2017-09-15 MED ORDER — CEFUROXIME AXETIL 500 MG PO TABS: 250.0000 mg | ORAL_TABLET | Freq: Two times a day (BID) | ORAL | Status: DC

## 2017-09-15 NOTE — Progress Notes (Signed)
Patient ID: [name], male   DOB: 1946-05-10, 72 y.o.   MRN: [id number]          [facility] Infectious [facility]  Date of Admission:  09/08/2017           Day 7 vancomycin        Day 7 ceftazidime ASSESSMENT: He continues to refuse having a PICC placed for long-term IV antibiotics.  A regimen of oral linezolid and a fluoroquinolone antibiotic has excellent bioavailability and broad coverage but is not suitable for use more than 3-4 weeks due to risk of adverse side effects.  Therefore I will treat him with oral doxycycline and cefuroxime each dose twice daily.  He is aware that there is a possibility that oral antibiotics may not be able to treat and suppress residual graft infection and that any relapse of infection could be potentially life-threatening.  PLAN: 1. Start oral doxycycline and cefuroxime 2. Discontinue vancomycin and ceftazidime 3. I will arrange follow-up in my clinic in 3-4 weeks 4. 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 amiodarone  200 mg Oral Daily  . aspirin EC  81 mg Oral Daily  . atorvastatin  80 mg Oral q1800  . Chlorhexidine Gluconate Cloth  6 each Topical Daily  . docusate sodium  100 mg Oral Daily  . escitalopram  20 mg Oral Daily  . fluticasone furoate-vilanterol  1 puff Inhalation Daily  . folic acid  1 mg Oral Daily  . isosorbide mononitrate  15 mg Oral Daily  . mouth rinse  15 mL Mouth Rinse BID  . mirtazapine  30 mg Oral QHS  . pantoprazole  40 mg Oral Daily  . potassium chloride  40 mEq Oral Daily  . sodium chloride flush  10-40 mL Intracatheter Q12H  . thiamine  100 mg Oral Daily  . tiotropium  18 mcg Inhalation Daily  . tiZANidine  4 mg Oral QHS  . tiZANidine  8 mg Oral Daily   Continuous Infusions: . sodium chloride 100 mL/hr at 09/11/17 2028  . cefTAZidime  (FORTAZ)  IV Stopped (09/15/17 0706)  . DOPamine Stopped (09/12/17 0044)  . magnesium sulfate 1 - 4 g bolus IVPB    . vancomycin Stopped (09/15/17 0559)   PRN Meds:.acetaminophen **OR** acetaminophen, albuterol, alum & mag hydroxide-simeth, guaiFENesin-dextromethorphan, hydrALAZINE, labetalol, magnesium sulfate 1 - 4 g bolus IVPB, metoprolol tartrate, morphine injection, nitroGLYCERIN, ondansetron, oxyCODONE-acetaminophen, phenol, potassium chloride, sodium chloride flush   SUBJECTIVE: He says "they have me all hopped up on pills".  He says that he has not changed his mind and adamantly and somewhat angrily tells me that he will not accept a PICC.  He says that he is willing to take oral antibiotics.  Review of Systems: Review of Systems  Constitutional: Positive for malaise/fatigue. Negative for chills, diaphoresis and fever.  Gastrointestinal: Negative for abdominal pain, diarrhea, nausea and vomiting.    Allergies  Allergen Reactions  . Aspirin Nausea Only  . Ibuprofen Hives    OBJECTIVE: Vitals:   09/15/17 0517 09/15/17 0833 09/15/17 0837 09/15/17 1202  BP: (!) 145/76 133/88    Pulse: (!) 55 66    Resp: 11 17    Temp: 97.9 F (36.6 C) 98.2 F (36.8 C)    TempSrc: Oral Oral    SpO2: 91% 92% 91% 95%  Weight:      Height:       Body mass index is 20.11 kg/m.  Physical Exam  Constitutional: He is oriented to person, place, and time.  He is sleepy.  Musculoskeletal:  His incisions are healing well.  Neurological: He is alert and oriented to person, place, and time.  Psychiatric:  Flat affect.      Lab Results Lab Results  Component Value Date   WBC 4.7 09/15/2017   HGB 7.6 (L) 09/15/2017   HCT 25.4 (L) 09/15/2017   MCV 84.9 09/15/2017   PLT 273 09/15/2017    Lab Results  Component Value Date   CREATININE 0.62 09/15/2017   BUN 11 09/15/2017   NA 139 09/15/2017   K 3.8 09/15/2017   CL 104 09/15/2017   CO2 28 09/15/2017    Lab Results  Component Value  Date   ALT 23 03/20/2017   AST 20 03/20/2017   ALKPHOS 55 03/20/2017   BILITOT 0.5 03/20/2017     Microbiology: Recent Results (from the past 240 hour(s))  Aerobic/Anaerobic Culture (surgical/deep wound)     Status: None   Collection Time: 09/08/17  4:25 PM  Result Value Ref Range Status   Specimen Description WOUND RIGHT GROIN  Final   Special Requests PATIENT ON FOLLOWING VANCOMYCIN,ZOSYN  Final   Gram Stain   Final    RARE WBC PRESENT, PREDOMINANTLY PMN NO ORGANISMS SEEN    Culture No growth aerobically or anaerobically.  Final   Report Status 09/14/2017 FINAL  Final  Aerobic/Anaerobic Culture (surgical/deep wound)     Status: None   Collection Time: 09/08/17  8:51 PM  Result Value Ref Range Status   Specimen Description WOUND LEFT GROIN  Final   Special Requests PATIENT ON FOLLOWING VANCOMYCIN,ZOSYN  Final   Gram Stain   Final    FEW WBC PRESENT, PREDOMINANTLY PMN NO ORGANISMS SEEN    Culture   Final    RARE DIPHTHEROIDS(CORYNEBACTERIUM SPECIES) Standardized susceptibility testing for this organism is not available. NO ANAEROBES ISOLATED    Report Status 09/14/2017 FINAL  Final  MRSA PCR Screening     Status: None   Collection Time: 09/09/17 12:32 AM  Result Value Ref Range Status   MRSA by PCR NEGATIVE NEGATIVE Final    Comment:        The GeneXpert MRSA Assay (FDA approved for NASAL specimens only), is one component of a comprehensive MRSA colonization surveillance program. It is not intended to diagnose MRSA infection nor to guide or monitor treatment for MRSA infections.     Michel Bickers, MD [facility] [place] [facility] [phone] pager   [phone] cell 09/15/2017, 12:10 PM

## 2017-09-15 NOTE — Progress Notes (Addendum)
  Progress Note    09/15/2017 8:14 AM 7 Days Post-Op  Subjective:  No new complaints   Vitals:   09/14/17 2136 09/15/17 0517  BP: (!) 107/50 (!) 145/76  Pulse: 60 (!) 55  Resp: 15 11  Temp: 98.1 F (36.7 C) 97.9 F (36.6 C)  SpO2: (!) 89% 91%   Physical Exam: Cardiac:  RRR Lungs:  Non labored Incisions:  B chest incisions stable; R groin incision with minimal thin, yellowish collection on dressing; B thigh incisions healing well Extremities:  Palpable B DP pulses Abdomen:  Soft Neurologic: A&O  CBC    Component Value Date/Time   WBC 4.7 09/15/2017 0321   RBC 2.99 (L) 09/15/2017 0321   HGB 7.6 (L) 09/15/2017 0321   HGB 8.2 (L) 05/08/2017 0907   HCT 25.4 (L) 09/15/2017 0321   HCT 27.6 (L) 05/08/2017 0907   PLT 273 09/15/2017 0321   PLT 376 05/08/2017 0907   MCV 84.9 09/15/2017 0321   MCV 77 (L) 05/08/2017 0907   MCH 25.4 (L) 09/15/2017 0321   MCHC 29.9 (L) 09/15/2017 0321   RDW 19.4 (H) 09/15/2017 0321   RDW 18.2 (H) 05/08/2017 0907   LYMPHSABS 0.8 03/28/2017 2131   MONOABS 0.3 03/28/2017 2131   EOSABS 0.1 03/28/2017 2131   BASOSABS 0.0 03/28/2017 2131    BMET    Component Value Date/Time   NA 139 09/15/2017 0321   NA 141 05/08/2017 0907   K 3.8 09/15/2017 0321   CL 104 09/15/2017 0321   CO2 28 09/15/2017 0321   GLUCOSE 87 09/15/2017 0321   BUN 11 09/15/2017 0321   BUN 16 05/08/2017 0907   CREATININE 0.62 09/15/2017 0321   CREATININE 0.8 10/08/2015 1500   CALCIUM 7.9 (L) 09/15/2017 0321   GFRNONAA >60 09/15/2017 0321   GFRAA >60 09/15/2017 0321    INR    Component Value Date/Time   INR 1.04 03/20/2017 0743     Intake/Output Summary (Last 24 hours) at 09/15/2017 0814 Last data filed at 09/15/2017 0517 Gross per 24 hour  Intake 180 ml  Output 902 ml  Net -722 ml     Assessment/Plan:  72 y.o. male is s/p #1 bilateral groin exploration and removal of right and left limb of aortofemoral bypass and saphenous vein patch angioplasty of common  femoral artery bilaterally. #2 bilateral axillary to superficial femoral artery bypasses with 8 mm ringed Gore-Tex graft  7 Days Post-Op   Continue dry dressing changes to B groin incisions Continue IV antibiotics while in patient; patient still refusing PICC placement; p.o. recommendations per ID ABL anemia: Hgb continues downward trend, transfuse 2 units PRBCs; recheck CBC tomorrow Home dose of Xarelto restarted We will delay proceeding with removal of remaining ABF graft during this admission Patient refusing placement and would like to be d/c home Possible d/c tomorrow if ok with ID   Dagoberto Ligas, PA-C [facility] [phone] 09/15/2017 8:14 AM   Addendum  I have independently interviewed and examined the patient, and I agree with the physician assistant's findings.  I agree with ID that IV abx would be best.  Hold on Xarelto one more day in case patient changes his mind on PICC.  He continues to refuse will restart Xarelto and get him ready for discharge home tomorrow.  Adele Barthel, MD, FACS [facility] [place] Office: [phone] Pager: [phone]  09/15/2017, 8:38 AM

## 2017-09-16 ENCOUNTER — Telehealth: Payer: Self-pay | Admitting: Vascular Surgery

## 2017-09-16 LAB — CBC
HEMATOCRIT: 34.4 % — AB (ref 39.0–52.0)
HEMOGLOBIN: 10.5 g/dL — AB (ref 13.0–17.0)
MCH: 25.7 pg — ABNORMAL LOW (ref 26.0–34.0)
MCHC: 30.5 g/dL (ref 30.0–36.0)
MCV: 84.3 fL (ref 78.0–100.0)
Platelets: 304 10*3/uL (ref 150–400)
RBC: 4.08 MIL/uL — ABNORMAL LOW (ref 4.22–5.81)
RDW: 18.1 % — ABNORMAL HIGH (ref 11.5–15.5)
WBC: 7.1 10*3/uL (ref 4.0–10.5)

## 2017-09-16 MED ORDER — CEFUROXIME AXETIL 500 MG PO TABS: 500.0000 mg | ORAL_TABLET | Freq: Two times a day (BID) | ORAL | 0 refills | Status: AC

## 2017-09-16 MED ORDER — DOXYCYCLINE HYCLATE 100 MG PO TABS: 100.0000 mg | ORAL_TABLET | Freq: Two times a day (BID) | ORAL | 0 refills | Status: AC

## 2017-09-16 NOTE — Progress Notes (Signed)
Called and clarified with Joanette Gula PA when the sutures are to be removed. Sutures in bilat. groin sites will be removed at the follow-up appointment 10/09/17. Pola Corn, RN

## 2017-09-16 NOTE — Progress Notes (Signed)
Reviewed and educated pt d/c medications, Rx, follow up appointments, incision care, instructions, when to call the MD. Answered all questions. Pt has all belongings, paperwork, Rx. D/C with daughter home via wheelchair. Pola Corn, RN

## 2017-09-16 NOTE — Telephone Encounter (Signed)
-----   Message from Mena Goes, RN sent at 09/16/2017  9:34 AM EST ----- Regarding: 2 weeks postop, ax-fem bypass    ----- Message ----- From: Iline Oven Sent: 09/16/2017   7:30 AM To: [facility] Charge Pool  Can you schedule an appt for this pt in about 2 weeks with Dr. Bridgett Larsson.  PO R PSA repair, B ax-fem bypass. Thanks, [facility]

## 2017-09-16 NOTE — Progress Notes (Signed)
This note also relates to the following rows which could not be included: Rate - Cannot attach notes to extension rows  Infusing @ slower rate (15ml/hr) due to hx of CHF/course bilateral lower lobe crackles.

## 2017-09-16 NOTE — Care Management Note (Signed)
Case Management Note Marvetta Gibbons RN, BSN Unit 4E-Case Manager-- [place] coverage [phone]  Patient Details  Name: [name] MRN: [id number] Date of Birth: August 08, 1946  Subjective/Objective:  Pt  transferred [facility] for acute false aneurysm in his right groin. admitted s/p Bilateral Groin Exploration, Removal of Prosthetic graft. Vein Patch Closure of bilateral common femoral Arteries. (Bilateral) BYPASS GRAFT AXILLA-BIFEMORAL                   Action/Plan: PTA pt lived at home- PT/OT evals ordered and pending for recommendations- CM to follow for transition of care needs.  Notified by Jonelle Sidle with [facility]- pt has pre-op referral to [facility] for any [facility] needs  Expected Discharge Date:  09/16/17               Expected Discharge Plan:  [place]  In-House Referral:  NA  Discharge planning Services  CM Consult  Post Acute Care Choice:  Home Health Choice offered to:  NA  DME Arranged:    DME Agency:     HH Arranged:  PT, OT HH Agency:  [facility]  Status of Service:  Completed, signed off  If discussed at [place] of Stay Meetings, dates discussed:    Discharge Disposition: home/home health   Additional Comments:  09/16/17- 1100- Dayane Hillenburg RN, CM- pt has decided that he does not want further surgery at this time and does not want IV abx - pt will go home on po abx per ID recommendations - pt had pre-op referral to [facility] for [place] with [facility] aware of d/c today and [facility] will f/u with pt tomorrow for [facility] needs.   Dawayne Patricia, RN 09/16/2017, 11:17 AM

## 2017-09-16 NOTE — Telephone Encounter (Signed)
Sched appt 10/09/17 at 12:45. Hm# not receiving calls, spoke to pt's daughter.

## 2017-09-16 NOTE — Progress Notes (Signed)
Physical Therapy Treatment Patient Details Name: [name] MRN: [id number] DOB: [birth year] Today's Date: 09/16/2017    History of Present Illness This 72 y.o. male admitted 09/08/16 for acute false aneurysm Rt groin. He was found to have bil. infected groin incisions (s/p aortobifemoral bypass 4/17); underwent bil. groin exploration and removal of Rt and Lt limb of aortofemoral bypass and saphenous vein patch angioplasty of common femoral artery bil. as well as bil. axillary to superficial femoral artery bypasses.  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  He underwent repair of ventral incisional hernia at [facility] 07/31/17 as well as bil. groin exploration; AAA, anxiety, A-Fib with RVR; CHF, cardiomyopathy, COPD, CAD, MI, PVD, CVA, ventricular dysfunction.     PT Comments    Pt required encouragement to participate in therapy this morning.  Pt reports increased pain in B groin but reports pain decreased with standing.  Pt required cues for safety with mobility and cues for pursed lip breathing as he desaturated to high 70s to min 80s.  Pt refusing SNF and will return home today.  Plan for HHPT is appropriate.  Pt had successful BM during session and NT informed.  Informed nursing of desaturation.  Pt resting in chair post session.      Follow Up Recommendations  Home health PT;SNF     Equipment Recommendations  Rolling walker with 5" wheels    Recommendations for Other Services       Precautions / Restrictions Precautions Precautions: Fall Precaution Comments: monitor BP  Restrictions Weight Bearing Restrictions: No    Mobility  Bed Mobility Overal bed mobility: Modified Independent Bed Mobility: Supine to Sit     Supine to sit: Modified independent (Device/Increase time)     General bed mobility comments: No assistance needed HOB elevated.  Pt able to follow commands to scoot forward.    Transfers Overall  transfer level: Needs assistance Equipment used: Rolling walker (2 wheeled) Transfers: Sit to/from Stand Sit to Stand: Supervision         General transfer comment: Cues for hand placement to and from seated surface.  Pt pulls into standing while holding on RW, no LOB but patient educated this is not safe practice.    Ambulation/Gait Ambulation/Gait assistance: Min guard Ambulation Distance (Feet): 160 Feet Assistive device: Rolling walker (2 wheeled) Gait Pattern/deviations: Step-through pattern;Decreased stride length Gait velocity: Decreased Gait velocity interpretation: Below normal speed for age/gender General Gait Details: Cues for upper trunk control and cues for forward gaze.  Pt slow and guarded due to pain.  SPo2 decreased in halls during ambulation and patient required cues for pursed lip breathing patient's lowest reading 79% and highest reading 100%.  Pt does not appeared labored.     Stairs            Wheelchair Mobility    Modified Rankin (Stroke Patients Only)       Balance Overall balance assessment: Needs assistance   Sitting balance-Leahy Scale: Good       Standing balance-Leahy Scale: Fair Standing balance comment: No LOB but does require UE support for balance in standing                             Cognition Arousal/Alertness: Awake/alert Behavior During Therapy: Flat affect Overall Cognitive Status: Within Functional Limits for tasks assessed  Exercises      General Comments        Pertinent Vitals/Pain Pain Assessment: 0-10 Pain Score: 8  Pain Location: generalized and bil. thighs/groin  Pain Descriptors / Indicators: Aching;Grimacing Pain Intervention(s): Monitored during session;Repositioned    Home Living                      Prior Function            PT Goals (current goals can now be found in the care plan section) Acute Rehab PT  Goals Potential to Achieve Goals: Good Progress towards PT goals: Progressing toward goals    Frequency    Min 3X/week      PT Plan Current plan remains appropriate    Co-evaluation              AM-PAC PT "6 Clicks" Daily Activity  Outcome Measure  Difficulty turning over in bed (including adjusting bedclothes, sheets and blankets)?: A Little Difficulty moving from lying on back to sitting on the side of the bed? : A Little Difficulty sitting down on and standing up from a chair with arms (e.g., wheelchair, bedside commode, etc,.)?: A Little Help needed moving to and from a bed to chair (including a wheelchair)?: A Little Help needed walking in hospital room?: A Little Help needed climbing 3-5 steps with a railing? : A Little 6 Click Score: 18    End of Session Equipment Utilized During Treatment: Gait belt Activity Tolerance: Patient limited by fatigue Patient left: in bed;with call bell/phone within reach Nurse Communication: Mobility status PT Visit Diagnosis: Other abnormalities of gait and mobility (R26.89);Muscle weakness (generalized) (M62.81)     Time: 6122-4497 PT Time Calculation (min) (ACUTE ONLY): 25 min  Charges:  $Gait Training: 8-22 mins $Therapeutic Activity: 8-22 mins                    G Codes:       [name], PTA pager [phone]    [name] 09/16/2017, 10:11 AM

## 2017-09-16 NOTE — Discharge Summary (Signed)
Physician Discharge Summary   Patient ID: [name] [id number] 72 y.o. [birth year]  Admit date: 09/08/2017  Discharge date and time: 09/16/17   Admitting Physician: Curt Jews, MD   Discharge Physician: same  Admission Diagnoses:  1.  Infected aortobifemoral graft 2.  Mycotic right femoral aneurysm  Discharge Diagnoses:  1.  Infected aortobifemoral graft 2.  Mycotic right femoral aneurysm  Admission Condition: critical  Discharged Condition: stable  Indication for Admission: Acute R femoral pseudoaneurysm and possible ABF bypass infection  Hospital Course: [name] is a 72 year old male transferred to [facility] for an acute pseudoaneurysm in his right groin.  He was suspected to have an infected aorta bifemoral bypass graft.  He was taken to the operating room emergently on 09/08/17 by Dr. Donnetta Hutching.  He underwent bilateral groin exploration and removal of right and left limb of aortobifem bypass and saphenous vein patch angioplasty of common femoral artery as well as bilateral axillary to superficial femoral artery bypasses.  He was transferred to the ICU where he received pressor support as well as continued transfusion of packed red blood cells.  POD #1 infectious disease was consulted for recommendations.  He received IV vancomycin and ceftazidim for the duration of his hospital stay.  Hospital course consisted of progression of activity, wound care especially in bilateral groin incisions which were loosely closed, as well as pain management.  PT and OT were consulted for evaluation and treatment however despite commendations for home PT patient feels comfortable returning home to live with his daughter.  Postoperatively patient had some difficulty with hypotension however pressor support was able to be weaned and he has resumed his current home antihypertensive regimen.  Throughout the hospital stay vascular surgery as discussed with patient the removal of  the remainder of his aortobifemoral bypass graft due to the high risk of infection, sepsis and death.  He patient refuses to proceed with the surgery and would like to follow-up in the office for further discussion.  Patient has also refused placement of PICC line and thus infectious disease recommended a p.o. regimen of doxycycline and cefuroxime.  Yesterday patient received 2 additional units of packed red blood cells due to a downward trend of hemoglobin.  Xarelto has been resumed.  Patient is feeling fit for discharge today.  He will continue p.o. antibiotic regimen until follow-up with infectious disease as an outpatient.  He will follow-up with vascular surgery in about 2 weeks.  He does not qualify for home health and he has been instructed on keeping bilateral groin incisions clean and dry with appropriate dressing changes.  Discharge instructions were reviewed with the patient and he voices his understanding.  He will be discharged this afternoon in stable condition.   Consults: pulmonary/intensive care and ID  Treatments: surgery: Dr Donnetta Hutching 09/08/17:  #1 bilateral groin exploration and removal of right and left limb of aortofemoral bypass and saphenous vein patch angioplasty of common femoral artery bilaterally.  #2 bilateral axillary to superficial femoral artery bypasses with 8 mm ringed Gore-Tex graft  Today 09/08/17: Patient is feeling fit for discharge home.  He denies subjective fevers or chills.  He is confident in his ability to ambulate and feels comfortable returning home to live with his daughter.  Patient also has a walker at home.  Discharge Exam: Vitals:   09/16/17 0445 09/16/17 0600  BP: (!) 148/78   Pulse: (!) 53 66  Resp: 16 17  Temp: 98.3 F (36.8 C)  SpO2: 92% 93%   Cardiac:  RRR Lungs:  Non labored Incisions:  Chest incisions stable; B groin incisions without drainage or obvious infection; B thigh incisions healing well Extremities:  Palpable and symmetric DP  pulses Abdomen:  Soft nontender Neurologic: A&O   Disposition: 01-Home or Self Care  Patient Instructions:  Allergies as of 09/16/2017      Reactions   Aspirin Nausea Only   Ibuprofen Hives      Medication List    TAKE these medications   amiodarone 200 MG tablet Commonly known as:  PACERONE TAKE 1 TABLET BY MOUTH ONCE DAILY   aspirin EC 81 MG tablet Take 81 mg by mouth daily.   atorvastatin 80 MG tablet Commonly known as:  LIPITOR Take 1 tablet (80 mg total) by mouth daily at 6 PM.   BREO ELLIPTA 100-25 MCG/INH Aepb Generic drug:  fluticasone furoate-vilanterol Inhale 1 puff into the lungs daily.   budesonide-formoterol 160-4.5 MCG/ACT inhaler Commonly known as:  SYMBICORT Inhale 2 puffs into the lungs 2 (two) times daily.   cefUROXime 500 MG tablet Commonly known as:  CEFTIN Take 1 tablet (500 mg total) by mouth 2 (two) times daily with a meal for 22 days.   doxycycline 100 MG tablet Commonly known as:  VIBRA-TABS Take 1 tablet (100 mg total) by mouth 2 (two) times daily with a meal for 22 days.   escitalopram 20 MG tablet Commonly known as:  LEXAPRO Take 20 mg by mouth daily.   folic acid 1 MG tablet Commonly known as:  FOLVITE Place 1 tablet (1 mg total) into feeding tube daily.   isosorbide mononitrate 30 MG 24 hr tablet Commonly known as:  IMDUR Take 0.5 tablets (15 mg total) by mouth daily.   MIRALAX packet Generic drug:  polyethylene glycol Take 17 g by mouth daily as needed for constipation.   mirtazapine 30 MG disintegrating tablet Commonly known as:  REMERON SOL-TAB Take 30 mg by mouth at bedtime.   NASONEX 50 MCG/ACT nasal spray Generic drug:  mometasone Place 2 sprays into the nose daily as needed (for sinus).   nitroGLYCERIN 0.4 MG SL tablet Commonly known as:  NITROSTAT Place 0.4 mg under the tongue every 5 (five) minutes as needed for chest pain.   oxyCODONE 5 MG immediate release tablet Commonly known as:  Oxy IR/ROXICODONE Take  15 mg by mouth every 4 (four) hours. For chronic post op pain   pantoprazole 40 MG tablet Commonly known as:  PROTONIX Take 1 tablet (40 mg total) by mouth daily.   PROAIR HFA 108 (90 Base) MCG/ACT inhaler Generic drug:  albuterol Inhale 1-2 puffs into the lungs every 6 (six) hours as needed for wheezing or shortness of breath.   rivaroxaban 20 MG Tabs tablet Commonly known as:  XARELTO Take 1 tablet (20 mg total) by mouth daily with supper.   SPIRIVA HANDIHALER 18 MCG inhalation capsule Generic drug:  tiotropium Place 18 mcg into inhaler and inhale daily.   thiamine 100 MG tablet Take 1 tablet (100 mg total) by mouth daily.   tiZANidine 4 MG tablet Commonly known as:  ZANAFLEX Take 4-8 mg by mouth See admin instructions. Take 8 mg every morning and take 4 mg at bedtime      Activity: activity as tolerated Diet: regular diet Wound Care: keep wound clean and dry and reinforce dressing PRN  Follow-up with Dr. Bridgett Larsson in 2 weeks. Follow up with Dr. Megan Salon in 3-4 weeks  Signed: Dagoberto Ligas  09/16/2017 7:46 AM  Addendum  [name] is a 72 y.o. (1946/04/28) male with known high cardiac risk previously underwent attempt at [facility] for large AAA and R CIA aneurysm and moderate L CIA aneurysm.  This ultimately failed due to a previously unknown 90 degree turn at the aortic neck.  The patient was converted to an open repair and underwent aortobifemoral bypass and B ligation of common iliac artery aneurysms that was unremarkable.  The patient recovered well from that procedure.  In reviewing this patient's chart, it appears he developed pneumonia with bacteremia leading admission to [facility] in the summer of 2018.  It is unclear exactly whether the aortobifemoral graft was infected during this period.  He was seen in the office and no pseudoaneurysms appreciated in his ABF after these admissions.  He then underwent a laparoscopic repair of ventral hernia at [facility] during which  bilateral groin masses were explored.  Necrotic tissue and pus reported was found in each groin.  The patient was managed with antibiotics and wound care by General Surgery at [facility], without contacting my office or myself with concern of graft infection.   The patient presented acutely with pulsatile mass in right groin on day of admission.  Imaging studies were consistent with rapidly enlarging pseudoaneurysm in right groin.  Dr. Donnetta Hutching brought this patient back to the OR and resected both aortobifemoral limbs.  There was pus in the left groin and poor incorporation of this limb reportedly.  Dr. Donnetta Hutching reconstructed this patient's perfusion with B axillo-SFA bypasses. Both groins were closed loosely and drains placed.  The patient was placed on wide spectrum antibiotics under the guidance of Infectious Disease.  This patient continued to recover slowly.  Patient eventually normalized his WBC count and fever curve resolved.    I had discussed resection of the residual portion of the aortobifemoral graft.  The patient is aware he is at risk for development of mycotic pseudoaneurysm at the proximal anastomosis which will likely be fatal.  The patient elected to defer further surgery or procedures for now.  He has NOT elected to proceed with comfort measures at this point despite his choice of treatments.  ID recommended: IV antibiotics for extended period, but patient refused PICC placement despite multiple physician discussing this with the patient.  ID subsequently altered the outpatient antibiotic regimen to continue with an oral regimen.  The patient will be discharged with home health to monitor the bilateral groin wound.  The patient will follow up with me in 2 weeks and ID in 4 weeks.    Adele Barthel, MD, FACS [facility] [place] Office: [phone] Pager: [phone]  09/16/2017, 12:24 PM

## 2017-09-16 NOTE — Progress Notes (Signed)
Occupational Therapy Treatment Patient Details Name: [name] MRN: [id number] DOB: 10-08-1945 Today's Date: 09/16/2017    History of present illness This 72 y.o. male admitted 09/08/16 for acute false aneurysm Rt groin. He was found to have bil. infected groin incisions (s/p aortobifemoral bypass 4/17); underwent bil. groin exploration and removal of Rt and Lt limb of aortofemoral bypass and saphenous vein patch angioplasty of common femoral artery bil. as well as bil. axillary to superficial femoral artery bypasses.  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  He underwent repair of ventral incisional hernia at [facility] 07/31/17 as well as bil. groin exploration; AAA, anxiety, A-Fib with RVR; CHF, cardiomyopathy, COPD, CAD, MI, PVD, CVA, ventricular dysfunction.    OT comments  Pt vomited as therapist arrived. Performed clothing change and washed face and hands with supervision. Pt requested to to return to bed, assisted back. Pt now able to cross his foot over opposite knee to reach feet for LB bathing and dressing. 02 sats in low 80s, but pt without dyspnea, did not appear accurate.  Follow Up Recommendations  Home health OT    Equipment Recommendations  3 in 1 bedside commode    Recommendations for Other Services      Precautions / Restrictions Precautions Precautions: Fall Precaution Comments: questionable reliability of 02 sats Restrictions Weight Bearing Restrictions: No       Mobility Bed Mobility Overal bed mobility: Modified Independent          Transfers Overall transfer level: Needs assistance Equipment used: Rolling walker (2 wheeled) Transfers: Sit to/from [employer] Transfers Sit to Stand: Supervision Stand pivot transfers: Supervision       General transfer comment: cues for hand placement from chair to bed    Balance Overall balance assessment: Needs assistance   Sitting balance-Leahy  Scale: Good       Standing balance-Leahy Scale: Fair Standing balance comment: fair static balance, assist of RW for ambulation                           ADL either performed or assessed with clinical judgement   ADL Overall ADL's : Needs assistance/impaired     Grooming: Wash/dry hands;Wash/dry face;Sitting;Set up           Upper Body Dressing : Set up;Sitting   Lower Body Dressing: Supervision/safety;Sit to/from stand Lower Body Dressing Details (indicate cue type and reason): pt able to cross foot over opposite knee to change socks             Functional mobility during ADLs: Supervision/safety;Rolling walker General ADL Comments: began education in energy conservation strategies     Vision       Perception     Praxis      Cognition Arousal/Alertness: Awake/alert Behavior During Therapy: Flat affect Overall Cognitive Status: Within Functional Limits for tasks assessed                                          Exercises     Shoulder Instructions       General Comments      Pertinent Vitals/ Pain       Pain Assessment: Faces Pain Score: 8  Faces Pain Scale: Hurts little more Pain Location: generalized and bil. thighs/groin  Pain Descriptors / Indicators: Aching;Grimacing Pain Intervention(s): Monitored during  session  Home Living                                          Prior Functioning/Environment              Frequency  Min 2X/week        Progress Toward Goals  OT Goals(current goals can now be found in the care plan section)  Progress towards OT goals: Progressing toward goals  Acute Rehab OT Goals Patient Stated Goal: to get better and return home  OT Goal Formulation: With patient Time For Goal Achievement: 09/24/17 Potential to Achieve Goals: Good  Plan Discharge plan needs to be updated    Co-evaluation                 AM-PAC PT "6 Clicks" Daily Activity      Outcome Measure   Help from another person eating meals?: None Help from another person taking care of personal grooming?: A Little Help from another person toileting, which includes using toliet, bedpan, or urinal?: A Little Help from another person bathing (including washing, rinsing, drying)?: A Little Help from another person to put on and taking off regular upper body clothing?: None Help from another person to put on and taking off regular lower body clothing?: A Little 6 Click Score: 20    End of Session Equipment Utilized During Treatment: Rolling walker  OT Visit Diagnosis: Muscle weakness (generalized) (M62.81)   Activity Tolerance Treatment limited secondary to medical complications (Comment)(vomited)   Patient Left in bed;with call bell/phone within reach   Nurse Communication Other (comment)(aware pt vomited)        Time: 0263-7858 OT Time Calculation (min): 16 min  Charges: OT General Charges $OT Visit: 1 Visit OT Treatments $Self Care/Home Management : 8-22 mins  09/16/2017 Nestor Lewandowsky, OTR/L Pager: [place], Haze [place] 09/16/2017, 10:43 AM

## 2017-09-17 LAB — BPAM RBC
BLOOD PRODUCT EXPIRATION DATE: 201902032359
Blood Product Expiration Date: 201902032359
ISSUE DATE / TIME: 201901081833
ISSUE DATE / TIME: 201901090051
Unit Type and Rh: 5100
Unit Type and Rh: 5100

## 2017-09-17 LAB — TYPE AND SCREEN
ABO/RH(D): O POS
Antibody Screen: NEGATIVE
UNIT DIVISION: 0
Unit division: 0

## 2017-09-24 DIAGNOSIS — Z95828 Presence of other vascular implants and grafts: Secondary | ICD-10-CM | POA: Diagnosis not present

## 2017-09-24 DIAGNOSIS — M961 Postlaminectomy syndrome, not elsewhere classified: Secondary | ICD-10-CM | POA: Diagnosis not present

## 2017-09-24 DIAGNOSIS — I722 Aneurysm of renal artery: Secondary | ICD-10-CM | POA: Diagnosis not present

## 2017-09-24 DIAGNOSIS — I723 Aneurysm of iliac artery: Secondary | ICD-10-CM | POA: Diagnosis not present

## 2017-09-24 DIAGNOSIS — G8928 Other chronic postprocedural pain: Secondary | ICD-10-CM | POA: Diagnosis not present

## 2017-09-24 DIAGNOSIS — I714 Abdominal aortic aneurysm, without rupture: Secondary | ICD-10-CM | POA: Diagnosis not present

## 2017-09-24 DIAGNOSIS — T8149XA Infection following a procedure, other surgical site, initial encounter: Secondary | ICD-10-CM | POA: Diagnosis not present

## 2017-09-24 DIAGNOSIS — I729 Aneurysm of unspecified site: Secondary | ICD-10-CM | POA: Diagnosis not present

## 2017-10-05 NOTE — Progress Notes (Signed)
     Post-operative Open AAA Repair   History of Present Illness   [name] is a 72 y.o. male who presents post-op s/p B ABF limb removal, B ax-SFA BPG for infected B ABF limbs (09/08/17) .  Patient refused to have the main body removed and refused PICC line placement for IV Abx.  The patient has no fever or chills.  The patient notes resolution of his right groin pain and most of his abdominal pain.     For VQI Use Only   PRE-ADM LIVING: Home  AMB STATUS: Ambulatory   Physical Examination   Vitals:   10/09/17 1243  BP: (!) 149/80  Pulse: (!) 55  Resp: 18  Temp: (!) 96.9 F (36.1 C)  TempSrc: Oral  SpO2: 98%  Weight: 113 lb (51.3 kg)  Height: 5\' 6"  (1.676 m)   Chest: B infraclavicular incision c/d/i  Abd: soft, mild BLQ TTP, -G/R, healed incisions  B groin: sutures in place (removed), incision c/d/i, no drainage  B thigh: inc c/d/i, palpable graft pulses   Medical Decision Making   [name] is a 72 y.o. male who presents s/p B ABF limb removal, B ax-SFA BPG for infected B ABF limbs    Pt still recovering from recent procedure.  Will give him another month of recovery and then proceed with tagged WBC scan.  I discussed again the concerns with possible residual infected aortic graft including death due to sepsis and rupture of aortic anastomosis.  He continues to be reluctant to proceed.  He continues to refuse PICC placement for IV antibiotics.  BLE ABI on next visit in a month.  Thank you for allowing [place] to participate in this patient's care.   Adele Barthel, MD, FACS [facility] Office: [phone] Pager: [phone]

## 2017-10-06 ENCOUNTER — Encounter: Payer: Self-pay | Admitting: Internal Medicine

## 2017-10-06 ENCOUNTER — Ambulatory Visit (INDEPENDENT_AMBULATORY_CARE_PROVIDER_SITE_OTHER): Payer: Medicare Other | Admitting: Internal Medicine

## 2017-10-06 DIAGNOSIS — T827XXD Infection and inflammatory reaction due to other cardiac and vascular devices, implants and grafts, subsequent encounter: Secondary | ICD-10-CM

## 2017-10-06 NOTE — Progress Notes (Signed)
[facility] for Infectious Disease  Patient Active Problem List   Diagnosis Date Noted  . Infection of vascular bypass graft ([place]) 09/09/2017    Priority: High  . Postoperative groin pseudoaneurysm ([place]) 09/08/2017  . PAD (peripheral artery disease) ([place]) 09/08/2017  . Hypotension 03/19/2017  . Bradycardia 03/19/2017  . Anemia 03/19/2017  . Hypokalemia 03/19/2017  . NSTEMI (non-ST elevated myocardial infarction) ([place])   . Coagulase negative Staphylococcus bacteremia 03/16/2017  . Demand ischemia of myocardium: Severe 3 vessel CAD, with hypoxic respiratory failure 03/16/2017  . Elevated troponin   . Pressure injury of skin 03/11/2017  . Acute respiratory failure ([place]) 03/10/2017  . Chronic obstructive pulmonary disease ([place])   . Atrial fibrillation with rapid ventricular response ([place]) 01/06/2016  . Abdominal aortic aneurysm (AAA) without rupture ([place]) 01/03/2016  . S/P AAA (abdominal aortic aneurysm) repair   . Cigarette smoker   . Acute respiratory failure with hypoxia ([place])   . Systolic congestive heart failure ([place])   . Cardiomyopathy, ischemic 10/27/2014  . Chronic cough   . Pulmonary infiltrate   . COPD  GOLD III/ still smoking  10/15/2014  . Bronchiectasis with acute exacerbation ([place]) 10/15/2014  . Tobacco abuse disorder 10/15/2014  . Persistent atrial fibrillation ([place])   . SVT (supraventricular tachycardia) ([place]) 10/14/2014  . 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 Abdominal mass 03/18/2013  . AAA (abdominal aortic aneurysm) without rupture ([place]) 03/18/2013  . Weight loss 05/07/2011  . HYPERCHOLESTEROLEMIA  IIA 09/18/2008  . HYPERTENSION, BENIGN 09/18/2008  . CAD, NATIVE VESSEL 09/18/2008    Patient's Medications  New Prescriptions   No medications on file  Previous Medications   ALBUTEROL (PROAIR HFA) 108 (90 BASE) MCG/ACT INHALER    Inhale 1-2  puffs into the lungs every 6 (six) hours as needed for wheezing or shortness of breath.    AMIODARONE (PACERONE) 200 MG TABLET    TAKE 1 TABLET BY MOUTH ONCE DAILY   ASPIRIN EC 81 MG TABLET    Take 81 mg by mouth daily.   ATORVASTATIN (LIPITOR) 80 MG TABLET    Take 1 tablet (80 mg total) by mouth daily at 6 PM.   BREO ELLIPTA 100-25 MCG/INH AEPB    Inhale 1 puff into the lungs daily.   BUDESONIDE-FORMOTEROL (SYMBICORT) 160-4.5 MCG/ACT INHALER    Inhale 2 puffs into the lungs 2 (two) times daily.   CEFUROXIME (CEFTIN) 500 MG TABLET    Take 1 tablet (500 mg total) by mouth 2 (two) times daily with a meal for 22 days.   DOXYCYCLINE (VIBRA-TABS) 100 MG TABLET    Take 1 tablet (100 mg total) by mouth 2 (two) times daily with a meal for 22 days.   ESCITALOPRAM (LEXAPRO) 20 MG TABLET    Take 20 mg by mouth daily.   FOLIC ACID (FOLVITE) 1 MG TABLET    Place 1 tablet (1 mg total) into feeding tube daily.   ISOSORBIDE MONONITRATE (IMDUR) 30 MG 24 HR TABLET    Take 0.5 tablets (15 mg total) by mouth daily.   MIRALAX PACKET    Take 17 g by mouth daily as needed for constipation.   MIRTAZAPINE (REMERON SOL-TAB) 30 MG DISINTEGRATING TABLET    Take 30 mg by mouth at bedtime.    MOMETASONE (NASONEX) 50 MCG/ACT NASAL SPRAY    Place 2 sprays into the nose daily  as needed (for sinus).    NITROGLYCERIN (NITROSTAT) 0.4 MG SL TABLET    Place 0.4 mg under the tongue every 5 (five) minutes as needed for chest pain.    OXYCODONE (OXY IR/ROXICODONE) 5 MG IMMEDIATE RELEASE TABLET    Take 15 mg by mouth every 4 (four) hours. For chronic post op pain   PANTOPRAZOLE (PROTONIX) 40 MG TABLET    Take 1 tablet (40 mg total) by mouth daily.   RIVAROXABAN (XARELTO) 20 MG TABS TABLET    Take 1 tablet (20 mg total) by mouth daily with supper.   THIAMINE 100 MG TABLET    Take 1 tablet (100 mg total) by mouth daily.   TIOTROPIUM (SPIRIVA HANDIHALER) 18 MCG INHALATION CAPSULE    Place 18 mcg into inhaler and inhale daily.   TIZANIDINE  (ZANAFLEX) 4 MG TABLET    Take 4-8 mg by mouth See admin instructions. Take 8 mg every morning and take 4 mg at bedtime  Modified Medications   No medications on file  Discontinued Medications   No medications on file    Subjective: Mr. [name] is in for his hospital follow-up visit. He is a 72 y.o. male with multiple chronic medical conditions who underwent aortobifemoral bypass grafting in April 2017.  He developed a ventral incisional hernia and was admitted to [facility] on 07/31/2017.  He underwent hernia repair.  He also had incidental incision and drainage of bilateral groin areas.  Purulence and necrosis was noted.  Operative Gram stain showed no organisms and cultures were negative.  He was discharged on empiric doxycycline.  He recently developed acute swelling of his right groin and was admitted here on New Year's Day.  He was found to have an infected pseudoaneurysm.  He underwent emergent surgery with resection of both left and right limbs of the graft and bilateral axillary to femoral bypass grafts.  Operative Gram stain showed no organisms.  Cultures from his right groin were sterile.  Diphtheroids grew from his left groin cultures.  He was treated with empiric IV antibiotics in the hospital.  Rifampin was not used because he was on anticoagulants that have drug drug interactions with.  He had been hospitalized last July with staph hominis bacteremia.  His vascular surgeons discussed their concerns about possible infection of the retained aortic limb of his graft but he did not want to consider any further surgery.  He also refused to have a PICC placed for outpatient IV antibiotics.  I elected to discharge him on oral cefuroxime and doxycycline.  He seems to be tolerating his antibiotics well and he is feeling better.  Review of Systems: Review of Systems  Constitutional: Negative for chills, diaphoresis and fever.  Cardiovascular: Negative for chest pain.  Gastrointestinal:  Positive for constipation. Negative for abdominal pain, diarrhea, nausea and vomiting.    Past Medical History:  Diagnosis Date  . AAA (abdominal aortic aneurysm) ([place])   . AAA (abdominal aortic aneurysm) without rupture ([place])   . Acute respiratory failure ([place]) 03/13/2017  . Anxiety   . Atrial fibrillation (HCC)     Brief episode of  . Atrial fibrillation with rapid ventricular response ([place]) 01/06/2016  . Cardiomyopathy, ischemic 10/27/2014  . CHF (congestive heart failure) ([place])   . COPD (chronic obstructive pulmonary disease) ([place])   . Coronary artery disease    Multivessel, S/p stenting to LCx. LHC 03/16/17: unchanged MVD- 50%mid LAD, 85% 1st daig, 100% prox circ, 100% prox RCA  . Depression   .  Dyslipidemia   . Headache   . Hyperlipidemia   . Hypertension   . Myocardial infarction ([place])   . Peripheral vascular disease ([place])   . Renal artery dissection ([place])   . Shortness of breath dyspnea   . Stroke ([place])   . Tobacco dependence   . Ventricular dysfunction    Mild Left    Social History   Tobacco Use  . Smoking status: Current Every Day Smoker    Packs/day: 0.50    Years: 20.00    Pack years: 10.00    Types: Cigarettes  . Smokeless tobacco: Never Used  Substance Use Topics  . Alcohol use: No  . Drug use: No    Family History  Problem Relation Age of Onset  . AAA (abdominal aortic aneurysm) Mother   . Cancer Father        bone marrow    Allergies  Allergen Reactions  . Aspirin Nausea Only  . Ibuprofen Hives    Objective: Vitals:   10/06/17 1410  BP: (!) 161/89  Pulse: 76  Temp: (!) 97.4 F (36.3 C)  Weight: 114 lb (51.7 kg)   Body mass index is 18.4 kg/m.  Physical Exam  Constitutional: He is oriented to person, place, and time.  He looks like he is feeling better and is in much better spirits than when I last saw him in the hospital.  Cardiovascular: Normal rate and regular rhythm.  No murmur heard. Pulmonary/Chest: Breath sounds normal. He  has no rales.  Abdominal: He exhibits no distension. There is no tenderness.  His surgical incisions are healing nicely.  Sutures remain in his groin incisions.  There is no drainage or surrounding cellulitis or fluctuance noted.  Bilateral axillofemoral grafts are palpable and without signs of any infection.  Neurological: He is alert and oriented to person, place, and time. Gait normal.  Skin: No rash noted.  Psychiatric: Mood and affect normal.     Problem List Items Addressed This Visit      High   Infection of vascular bypass graft ([place])    Is a difficult situation as it is impossible to know if there is any persistent infection of the retained aortic limb of his old graft.  I told him that the only way to ever know if he has a cured would be to stop all antibiotics and wait and see what happens.  If we are only suppressing persistent graft infection that would become a life-threatening emergency for him.  He is tolerating his oral antibiotics well and we agree that he will continue them for now.  He will follow-up with me in 2 months.          Michel Bickers, MD [facility] [phone] pager   [phone] cell 10/06/2017, 2:33 PM

## 2017-10-06 NOTE — Assessment & Plan Note (Signed)
Is a difficult situation as it is impossible to know if there is any persistent infection of the retained aortic limb of his old graft.  I told him that the only way to ever know if he has a cured would be to stop all antibiotics and wait and see what happens.  If we are only suppressing persistent graft infection that would become a life-threatening emergency for him.  He is tolerating his oral antibiotics well and we agree that he will continue them for now.  He will follow-up with me in 2 months.

## 2017-10-09 ENCOUNTER — Ambulatory Visit (INDEPENDENT_AMBULATORY_CARE_PROVIDER_SITE_OTHER): Payer: Medicare Other | Admitting: Vascular Surgery

## 2017-10-09 ENCOUNTER — Encounter: Payer: Self-pay | Admitting: Vascular Surgery

## 2017-10-09 VITALS — BP 149/80 | HR 55 | Temp 96.9°F | Resp 18 | Ht 66.0 in | Wt 113.0 lb

## 2017-10-09 DIAGNOSIS — T827XXD Infection and inflammatory reaction due to other cardiac and vascular devices, implants and grafts, subsequent encounter: Secondary | ICD-10-CM

## 2017-10-12 ENCOUNTER — Other Ambulatory Visit: Payer: Self-pay

## 2017-10-12 DIAGNOSIS — I739 Peripheral vascular disease, unspecified: Secondary | ICD-10-CM

## 2017-10-26 DIAGNOSIS — F331 Major depressive disorder, recurrent, moderate: Secondary | ICD-10-CM | POA: Diagnosis not present

## 2017-10-26 DIAGNOSIS — M961 Postlaminectomy syndrome, not elsewhere classified: Secondary | ICD-10-CM | POA: Diagnosis not present

## 2017-10-26 DIAGNOSIS — J449 Chronic obstructive pulmonary disease, unspecified: Secondary | ICD-10-CM | POA: Diagnosis not present

## 2017-10-26 DIAGNOSIS — G8921 Chronic pain due to trauma: Secondary | ICD-10-CM | POA: Diagnosis not present

## 2017-10-26 DIAGNOSIS — G8928 Other chronic postprocedural pain: Secondary | ICD-10-CM | POA: Diagnosis not present

## 2017-11-16 NOTE — Progress Notes (Signed)
Post-operative Open AAA Repair   History of Present Illness   DONG NIMMONS is a 72 y.o. ([birth year]) male who presents post-op s/p B ABF limb removal, B ax-SFA BPG for infected B ABF limbs (09/08/17) .  Patient refused to have the main body removed and refused PICC line placement for IV Abx.  The patient has had no fever or chills.  The patient notes resolution of his right groin pain and most of his abdominal pain but continues to have a vague discomfort in his abdomen.  He notes continued weight loss.     Current Outpatient Medications  Medication Sig Dispense Refill  . albuterol (PROAIR HFA) 108 (90 Base) MCG/ACT inhaler Inhale 1-2 puffs into the lungs every 6 (six) hours as needed for wheezing or shortness of breath.     [name] amiodarone (PACERONE) 200 MG tablet TAKE 1 TABLET BY MOUTH ONCE DAILY 90 tablet 3  . aspirin EC 81 MG tablet Take 81 mg by mouth daily.    [name] atorvastatin (LIPITOR) 80 MG tablet Take 1 tablet (80 mg total) by mouth daily at 6 PM. 30 tablet 5  . BREO ELLIPTA 100-25 MCG/INH AEPB Inhale 1 puff into the lungs daily.  1  . budesonide-formoterol (SYMBICORT) 160-4.5 MCG/ACT inhaler Inhale 2 puffs into the lungs 2 (two) times daily. 1 Inhaler 12  . cefUROXime (CEFTIN) 500 MG tablet Take 500 mg by mouth 2 (two) times daily with a meal.    . doxycycline (VIBRA-TABS) 100 MG tablet Take 100 mg by mouth 2 (two) times daily.    [name] escitalopram (LEXAPRO) 20 MG tablet Take 20 mg by mouth daily.    . folic acid (FOLVITE) 1 MG tablet Place 1 tablet (1 mg total) into feeding tube daily. 30 tablet 1  . isosorbide mononitrate (IMDUR) 30 MG 24 hr tablet Take 0.5 tablets (15 mg total) by mouth daily. 30 tablet 2  . MIRALAX packet Take 17 g by mouth daily as needed for constipation.  1  . mirtazapine (REMERON SOL-TAB) 30 MG disintegrating tablet Take 30 mg by mouth at bedtime.     . mometasone (NASONEX) 50 MCG/ACT nasal spray Place 2 sprays into the nose daily as needed (for  sinus).     . nitroGLYCERIN (NITROSTAT) 0.4 MG SL tablet Place 0.4 mg under the tongue every 5 (five) minutes as needed for chest pain.     [name] oxyCODONE (OXY IR/ROXICODONE) 5 MG immediate release tablet Take 15 mg by mouth every 4 (four) hours. For chronic post op pain    . pantoprazole (PROTONIX) 40 MG tablet Take 1 tablet (40 mg total) by mouth daily. 30 tablet 1  . rivaroxaban (XARELTO) 20 MG TABS tablet Take 1 tablet (20 mg total) by mouth daily with supper. 30 tablet 5  . thiamine 100 MG tablet Take 1 tablet (100 mg total) by mouth daily. 30 tablet 1  . tiotropium (SPIRIVA HANDIHALER) 18 MCG inhalation capsule Place 18 mcg into inhaler and inhale daily.    [name] tiZANidine (ZANAFLEX) 4 MG tablet Take 4-8 mg by mouth See admin instructions. Take 8 mg every morning and take 4 mg at bedtime     No current facility-administered medications for this visit.       Physical Examination   Vitals:   11/18/17 1157  BP: 136/83  Pulse: 76  Resp: 18  Temp: (!) 97.5 F (36.4 C)  TempSrc: Oral  SpO2: 100%  Weight: 114 lb (51.7 kg)  Height:  5\' 6"  (1.676 m)   Body mass index is 18.4 kg/m.   Chest: B infraclavicular incisions healed  Abd: soft, mild BLQ TTP, -G/R, healed incisions  B groin: all incisions healed  B thigh: all incisions healed, palpable graft pulses   Non-invasive Vascular Imaging   ABI (11/18/2017)  R:   ABI: 1.06,   PT: bi  DP: bi  TBI:  0.92  L:   ABI: 0.99,   PT: bi  DP: bi  TBI: 0.99   Medical Decision Making   JAYLYNN SIEFERT is a 72 y.o. ([birth year]) male who presents s/p B ABF limb removal, B ax-SFA BPG for infected B ABF limbs    I discussed again the concerns with possible residual infected aortic graft including death due to sepsis and rupture of aortic anastomosis.    Will proceed with WBC tagged scan abd/pelvis at this point to evaluate any residual infection of main body.  Will have him follow up after the scan.  Thank you  for allowing [place] to participate in this patient's care.   Adele Barthel, MD, FACS [facility] Office: [phone] Pager: [phone]

## 2017-11-18 ENCOUNTER — Other Ambulatory Visit: Payer: Self-pay | Admitting: *Deleted

## 2017-11-18 ENCOUNTER — Ambulatory Visit (HOSPITAL_COMMUNITY)
Admission: RE | Admit: 2017-11-18 | Discharge: 2017-11-18 | Disposition: A | Discharge status: Home / Self Care | Payer: Medicare Other | Source: Ambulatory Visit | Attending: Vascular Surgery | Admitting: Vascular Surgery

## 2017-11-18 ENCOUNTER — Other Ambulatory Visit: Payer: Self-pay

## 2017-11-18 ENCOUNTER — Encounter: Payer: Self-pay | Admitting: Vascular Surgery

## 2017-11-18 ENCOUNTER — Ambulatory Visit (INDEPENDENT_AMBULATORY_CARE_PROVIDER_SITE_OTHER): Payer: Medicare Other | Admitting: Vascular Surgery

## 2017-11-18 VITALS — BP 136/83 | HR 76 | Temp 97.5°F | Resp 18 | Ht 66.0 in | Wt 114.0 lb

## 2017-11-18 DIAGNOSIS — R1084 Generalized abdominal pain: Secondary | ICD-10-CM

## 2017-11-18 DIAGNOSIS — I739 Peripheral vascular disease, unspecified: Secondary | ICD-10-CM | POA: Insufficient documentation

## 2017-11-18 DIAGNOSIS — T827XXD Infection and inflammatory reaction due to other cardiac and vascular devices, implants and grafts, subsequent encounter: Secondary | ICD-10-CM

## 2017-11-26 DIAGNOSIS — G8928 Other chronic postprocedural pain: Secondary | ICD-10-CM | POA: Diagnosis not present

## 2017-11-26 DIAGNOSIS — I739 Peripheral vascular disease, unspecified: Secondary | ICD-10-CM | POA: Diagnosis not present

## 2017-11-26 DIAGNOSIS — G8921 Chronic pain due to trauma: Secondary | ICD-10-CM | POA: Diagnosis not present

## 2017-11-26 DIAGNOSIS — I10 Essential (primary) hypertension: Secondary | ICD-10-CM | POA: Diagnosis not present

## 2017-11-26 DIAGNOSIS — I248 Other forms of acute ischemic heart disease: Secondary | ICD-10-CM | POA: Diagnosis not present

## 2017-11-26 DIAGNOSIS — Z79899 Other long term (current) drug therapy: Secondary | ICD-10-CM | POA: Diagnosis not present

## 2017-11-26 DIAGNOSIS — R29898 Other symptoms and signs involving the musculoskeletal system: Secondary | ICD-10-CM | POA: Diagnosis not present

## 2017-11-26 DIAGNOSIS — I481 Persistent atrial fibrillation: Secondary | ICD-10-CM | POA: Diagnosis not present

## 2017-11-26 DIAGNOSIS — F4321 Adjustment disorder with depressed mood: Secondary | ICD-10-CM | POA: Diagnosis not present

## 2017-11-26 DIAGNOSIS — J449 Chronic obstructive pulmonary disease, unspecified: Secondary | ICD-10-CM | POA: Diagnosis not present

## 2017-11-26 DIAGNOSIS — M961 Postlaminectomy syndrome, not elsewhere classified: Secondary | ICD-10-CM | POA: Diagnosis not present

## 2017-11-27 ENCOUNTER — Encounter (HOSPITAL_COMMUNITY)
Admission: RE | Admit: 2017-11-27 | Discharge: 2017-11-27 | Disposition: A | Discharge status: Home / Self Care | Payer: Medicare Other | Source: Ambulatory Visit | Attending: Vascular Surgery | Admitting: Vascular Surgery

## 2017-11-27 DIAGNOSIS — L089 Local infection of the skin and subcutaneous tissue, unspecified: Secondary | ICD-10-CM | POA: Diagnosis not present

## 2017-11-27 DIAGNOSIS — R1084 Generalized abdominal pain: Secondary | ICD-10-CM | POA: Insufficient documentation

## 2017-11-27 MED ORDER — TECHNETIUM TC 99M EXAMETAZIME IV KIT
9.9600 | PACK | Freq: Once | INTRAVENOUS | Status: AC | PRN
  Administered 2017-11-27: 9.96 via INTRAVENOUS

## 2017-12-08 ENCOUNTER — Ambulatory Visit (INDEPENDENT_AMBULATORY_CARE_PROVIDER_SITE_OTHER): Payer: Medicare Other | Admitting: Internal Medicine

## 2017-12-08 DIAGNOSIS — T827XXD Infection and inflammatory reaction due to other cardiac and vascular devices, implants and grafts, subsequent encounter: Secondary | ICD-10-CM | POA: Diagnosis present

## 2017-12-08 NOTE — Assessment & Plan Note (Signed)
There are no obvious signs of active infection by exam or if his recent tagged white blood cell scan.  He is tolerating chronic, empiric, suppressive antibiotic therapy.  He will continue his antibiotics for now and follow-up in 4 months.

## 2017-12-08 NOTE — Progress Notes (Signed)
[facility]  Patient Active Problem List   Diagnosis Date Noted  . 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 Postoperative groin pseudoaneurysm ([place]) 09/08/2017  . PAD (peripheral artery disease) ([place]) 09/08/2017  . Hypotension 03/19/2017  . Bradycardia 03/19/2017  . Anemia 03/19/2017  . Hypokalemia 03/19/2017  . NSTEMI (non-ST elevated myocardial infarction) ([place])   . Coagulase negative Staphylococcus bacteremia 03/16/2017  . Demand ischemia of myocardium: Severe 3 vessel CAD, with hypoxic respiratory failure 03/16/2017  . Elevated troponin   . Pressure injury of skin 03/11/2017  . Acute respiratory failure ([place]) 03/10/2017  . Chronic obstructive pulmonary disease ([place])   . Atrial fibrillation with rapid ventricular response ([place]) 01/06/2016  . Abdominal aortic aneurysm (AAA) without rupture ([place]) 01/03/2016  . S/P AAA (abdominal aortic aneurysm) repair   . Cigarette smoker   . Acute respiratory failure with hypoxia ([place])   . Systolic congestive heart failure ([place])   . Cardiomyopathy, ischemic 10/27/2014  . Chronic cough   . Pulmonary infiltrate   . COPD  GOLD III/ still smoking  10/15/2014  . Bronchiectasis with acute exacerbation ([place]) 10/15/2014  . Tobacco abuse disorder 10/15/2014  . Persistent atrial fibrillation ([place])   . SVT (supraventricular tachycardia) ([place]) 10/14/2014  . 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 Abdominal mass 03/18/2013  . AAA (abdominal aortic aneurysm) without rupture ([place]) 03/18/2013  . Weight loss 05/07/2011  . HYPERCHOLESTEROLEMIA  IIA 09/18/2008  . HYPERTENSION, BENIGN 09/18/2008  . CAD, NATIVE VESSEL 09/18/2008    Patient's Medications  New Prescriptions   No medications on file  Previous Medications   ALBUTEROL (PROAIR HFA) 108 (90 BASE) MCG/ACT INHALER    Inhale 1-2  puffs into the lungs every 6 (six) hours as needed for wheezing or shortness of breath.    AMIODARONE (PACERONE) 200 MG TABLET    TAKE 1 TABLET BY MOUTH ONCE DAILY   ASPIRIN EC 81 MG TABLET    Take 81 mg by mouth daily.   ATORVASTATIN (LIPITOR) 80 MG TABLET    Take 1 tablet (80 mg total) by mouth daily at 6 PM.   BREO ELLIPTA 100-25 MCG/INH AEPB    Inhale 1 puff into the lungs daily.   BUDESONIDE-FORMOTEROL (SYMBICORT) 160-4.5 MCG/ACT INHALER    Inhale 2 puffs into the lungs 2 (two) times daily.   CEFUROXIME (CEFTIN) 500 MG TABLET    Take 500 mg by mouth 2 (two) times daily with a meal.   DOXYCYCLINE (VIBRA-TABS) 100 MG TABLET    Take 100 mg by mouth 2 (two) times daily.   ESCITALOPRAM (LEXAPRO) 20 MG TABLET    Take 20 mg by mouth daily.   FOLIC ACID (FOLVITE) 1 MG TABLET    Place 1 tablet (1 mg total) into feeding tube daily.   ISOSORBIDE MONONITRATE (IMDUR) 30 MG 24 HR TABLET    Take 0.5 tablets (15 mg total) by mouth daily.   MIRALAX PACKET    Take 17 g by mouth daily as needed for constipation.   MIRTAZAPINE (REMERON SOL-TAB) 30 MG DISINTEGRATING TABLET    Take 30 mg by mouth at bedtime.    MOMETASONE (NASONEX) 50 MCG/ACT NASAL SPRAY    Place 2 sprays into the nose daily as needed (for sinus).    NITROGLYCERIN (NITROSTAT) 0.4 MG SL TABLET  Place 0.4 mg under the tongue every 5 (five) minutes as needed for chest pain.    OXYCODONE (OXY IR/ROXICODONE) 5 MG IMMEDIATE RELEASE TABLET    Take 15 mg by mouth every 4 (four) hours. For chronic post op pain   PANTOPRAZOLE (PROTONIX) 40 MG TABLET    Take 1 tablet (40 mg total) by mouth daily.   RIVAROXABAN (XARELTO) 20 MG TABS TABLET    Take 1 tablet (20 mg total) by mouth daily with supper.   THIAMINE 100 MG TABLET    Take 1 tablet (100 mg total) by mouth daily.   TIOTROPIUM (SPIRIVA HANDIHALER) 18 MCG INHALATION CAPSULE    Place 18 mcg into inhaler and inhale daily.   TIZANIDINE (ZANAFLEX) 4 MG TABLET    Take 4-8 mg by mouth See admin instructions.  Take 8 mg every morning and take 4 mg at bedtime  Modified Medications   No medications on file  Discontinued Medications   No medications on file    Subjective: [name] is in for his pain follow-up visit. He is a 72 y.o. male with multiple chronic medical conditions who underwent aortobifemoral bypass grafting in April 2017.  He developed a ventral incisional hernia and was admitted to [facility] on 07/31/2017.  He underwent hernia repair.  He also had incidental incision and drainage of bilateral groin areas.  Purulence and necrosis was noted.  Operative Gram stain showed no organisms and cultures were negative.  He was discharged on empiric doxycycline.  He recently developed acute swelling of his right groin and was admitted here on New Year's Day.  He was found to have an infected pseudoaneurysm.  He underwent emergent surgery with resection of both left and right limbs of the graft and bilateral axillary to femoral bypass grafts.  Operative Gram stain showed no organisms.  Cultures from his right groin were sterile.  Diphtheroids grew from his left groin cultures.  He was treated with empiric IV antibiotics in the hospital.  Rifampin was not used because he was on anticoagulants that have drug drug interactions with.  He had been hospitalized last July with staph hominis bacteremia.  His vascular surgeons discussed their concerns about possible infection of the retained aortic limb of his graft but he did not want to consider any further surgery.  He also refused to have a PICC placed for outpatient IV antibiotics.  I elected to discharge him on oral cefuroxime and doxycycline.  He seems to be tolerating his antibiotics well and he is feeling better except for concerns that he has been unable to gain the weight that he lost and pain in his groin and along his axillary grafts.  He states that he is not having any fever, chills, sweats, nausea, vomiting or diarrhea.  He saw his vascular  surgeon, Dr. Adele Barthel, recently.  He ordered a tagged white blood cell scan because of ongoing concerns about persistent infection of the retained limb of his previous aortic graft.  That scan was normal.  Review of Systems: Review of Systems  Constitutional: Negative for chills, diaphoresis and fever.  Cardiovascular: Negative for chest pain.  Gastrointestinal: Positive for constipation. Negative for abdominal pain, diarrhea, nausea and vomiting.  Musculoskeletal:       He has some back pain, pain along his axillary grafts and discomfort "deep inside" his groin bilaterally.    Past Medical History:  Diagnosis Date  . AAA (abdominal aortic aneurysm) ([place])   . AAA (abdominal aortic aneurysm) without  rupture ([place])   . Acute respiratory failure ([place]) 03/13/2017  . Anxiety   . Atrial fibrillation (H[facility])     Brief episode of  . Atrial fibrillation with rapid ventricular response ([place]) 01/06/2016  . Cardiomyopathy, ischemic 10/27/2014  . CHF (congestive heart failure) ([place])   . COPD (chronic obstructive pulmonary disease) ([place])   . Coronary artery disease    Multivessel, S/p stenting to LCx. LHC 03/16/17: unchanged MVD- 50%mid LAD, 85% 1st daig, 100% prox circ, 100% prox RCA  . Depression   . Dyslipidemia   . Headache   . Hyperlipidemia   . Hypertension   . Myocardial infarction ([place])   . Peripheral vascular disease ([place])   . Renal artery dissection ([place])   . Shortness of breath dyspnea   . Stroke ([place])   . Tobacco dependence   . Ventricular dysfunction    Mild Left    Social History   Tobacco Use  . Smoking status: Current Every Day Smoker    Packs/day: 0.50    Years: 20.00    Pack years: 10.00    Types: Cigarettes  . Smokeless tobacco: Never Used  Substance Use Topics  . Alcohol use: No  . Drug use: No    Family History  Problem Relation Age of Onset  . AAA (abdominal aortic aneurysm) Mother   . Cancer Father        bone marrow    Allergies  Allergen Reactions    . Aspirin Nausea Only  . Ibuprofen Hives    Objective: Vitals:   12/08/17 1440  Weight: 116 lb (52.6 kg)   Body mass index is 18.72 kg/m.  Physical Exam  Constitutional: He is oriented to person, place, and time.  Weight is unchanged since January but down about 16 pounds over the past year.  Cardiovascular: Normal rate and regular rhythm.  No murmur heard. Pulmonary/Chest: Breath sounds normal. He has no rales.  Abdominal: He exhibits no distension. There is no tenderness.  His surgical incisions healed nicely. Bilateral axillofemoral grafts are palpable and without signs of any infection.  Neurological: He is alert and oriented to person, place, and time. Gait normal.  Skin: No rash noted.  Psychiatric: Mood and affect normal.     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  He is tolerating chronic, empiric, suppressive antibiotic therapy.  He will continue his antibiotics for now and follow-up in 4 months.          Daniel Bickers, MD [facility] [phone] pager   [phone] cell 12/08/2017, 3:12 PM

## 2017-12-08 NOTE — Patient Instructions (Signed)
Your daughter can call me at my office [phone].

## 2017-12-11 DIAGNOSIS — R109 Unspecified abdominal pain: Secondary | ICD-10-CM | POA: Diagnosis not present

## 2017-12-11 DIAGNOSIS — Z79899 Other long term (current) drug therapy: Secondary | ICD-10-CM | POA: Diagnosis not present

## 2017-12-11 DIAGNOSIS — M542 Cervicalgia: Secondary | ICD-10-CM | POA: Diagnosis not present

## 2017-12-11 DIAGNOSIS — G894 Chronic pain syndrome: Secondary | ICD-10-CM | POA: Diagnosis not present

## 2017-12-11 DIAGNOSIS — Z79891 Long term (current) use of opiate analgesic: Secondary | ICD-10-CM | POA: Diagnosis not present

## 2017-12-14 NOTE — Progress Notes (Deleted)
Established Open AAA Repair   History of Present Illness   [name] is a 72 y.o. (1946-07-27) male who presents for cc: ***.  Prior procedures:  1.  Failed EVAR due 90 degree aortic neck angulation, ABF (01/03/16) 2.  Excision of B ABF limbs for infection, B ax-SFA BPG (09/08/17)  Despite recommendations from Dr. Donnetta Hutching and myself, patient refused to have main body of ABF removed.  Recently we discussed getting a WBC tagged scan to evaluate the main body.  The patient recent saw his ID doctor who recommends continuing abx. The patient's current sx are:  ***  The patient's PMH, PSH, SH, and FamHx were reviewed on 12/16/17 are unchanged from 11/18/17.  Current Outpatient Medications  Medication Sig Dispense Refill  . albuterol (PROAIR HFA) 108 (90 Base) MCG/ACT inhaler Inhale 1-2 puffs into the lungs every 6 (six) hours as needed for wheezing or shortness of breath.     [name] amiodarone (PACERONE) 200 MG tablet TAKE 1 TABLET BY MOUTH ONCE DAILY 90 tablet 3  . aspirin EC 81 MG tablet Take 81 mg by mouth daily.    [name] atorvastatin (LIPITOR) 80 MG tablet Take 1 tablet (80 mg total) by mouth daily at 6 PM. 30 tablet 5  . BREO ELLIPTA 100-25 MCG/INH AEPB Inhale 1 puff into the lungs daily.  1  . budesonide-formoterol (SYMBICORT) 160-4.5 MCG/ACT inhaler Inhale 2 puffs into the lungs 2 (two) times daily. 1 Inhaler 12  . cefUROXime (CEFTIN) 500 MG tablet Take 500 mg by mouth 2 (two) times daily with a meal.    . doxycycline (VIBRA-TABS) 100 MG tablet Take 100 mg by mouth 2 (two) times daily.    [name] escitalopram (LEXAPRO) 20 MG tablet Take 20 mg by mouth daily.    . folic acid (FOLVITE) 1 MG tablet Place 1 tablet (1 mg total) into feeding tube daily. 30 tablet 1  . isosorbide mononitrate (IMDUR) 30 MG 24 hr tablet Take 0.5 tablets (15 mg total) by mouth daily. 30 tablet 2  . MIRALAX packet Take 17 g by mouth daily as needed for constipation.  1  . mirtazapine (REMERON SOL-TAB) 30 MG  disintegrating tablet Take 30 mg by mouth at bedtime.     . mometasone (NASONEX) 50 MCG/ACT nasal spray Place 2 sprays into the nose daily as needed (for sinus).     . nitroGLYCERIN (NITROSTAT) 0.4 MG SL tablet Place 0.4 mg under the tongue every 5 (five) minutes as needed for chest pain.     [name] oxyCODONE (OXY IR/ROXICODONE) 5 MG immediate release tablet Take 15 mg by mouth every 4 (four) hours. For chronic post op pain    . pantoprazole (PROTONIX) 40 MG tablet Take 1 tablet (40 mg total) by mouth daily. 30 tablet 1  . rivaroxaban (XARELTO) 20 MG TABS tablet Take 1 tablet (20 mg total) by mouth daily with supper. 30 tablet 5  . thiamine 100 MG tablet Take 1 tablet (100 mg total) by mouth daily. 30 tablet 1  . tiotropium (SPIRIVA HANDIHALER) 18 MCG inhalation capsule Place 18 mcg into inhaler and inhale daily.    [name] tiZANidine (ZANAFLEX) 4 MG tablet Take 4-8 mg by mouth See admin instructions. Take 8 mg every morning and take 4 mg at bedtime     No current facility-administered medications for this visit.     Allergies  Allergen Reactions  . Aspirin Nausea Only  . Ibuprofen Hives    On ROS today: ***, ***.  Physical Examination  ***There were no vitals filed for this visit. ***There is no height or weight on file to calculate BMI.  General {LOC:19197::"Somulent","Alert"}, {Orientation:19197::"Confused","O x 3"}, {Weight:19197::"Obese","Cachectic","WD"}, {General state of health:19197::"Ill appearing","Elderly","NAD"}  Pulmonary {Chest wall:19197::"Asx chest movement","Sym exp"}, {Air movt:19197::"Decreased *** air movt","good B air movt"}, {BS:19197::"rales on ***","rhonchi on ***","wheezing on ***","CTA B"}  Cardiac {Rhythm:19197::"Irregularly, irregular rate and rhythm","RRR, Nl S1, S2"}, {Murmur:19197::"Murmur present: ***","no Murmurs"}, {Rubs:19197::"Rub present: ***","No rubs"}, {Gallop:19197::"Gallop present: ***","No S3,S4"}  Vascular Vessel Right Left  Radial  {Palpable:19197::"Not palpable","Faintly palpable","Palpable"} {Palpable:19197::"Not palpable","Faintly palpable","Palpable"}  Brachial {Palpable:19197::"Not palpable","Faintly palpable","Palpable"} {Palpable:19197::"Not palpable","Faintly palpable","Palpable"}  Carotid Palpable, {Bruit:19197::"Bruit present","No Bruit"} Palpable, {Bruit:19197::"Bruit present","No Bruit"}  Aorta Not palpable N/A  Femoral {Palpable:19197::"Not palpable","Faintly palpable","Palpable"} {Palpable:19197::"Not palpable","Faintly palpable","Palpable"}  Popliteal {Palpable:19197::"Prominently palpable","Not palpable"} {Palpable:19197::"Prominently palpable","Not palpable"}  PT {Palpable:19197::"Not palpable","Faintly palpable","Palpable"} {Palpable:19197::"Not palpable","Faintly palpable","Palpable"}  DP {Palpable:19197::"Not palpable","Faintly palpable","Palpable"} {Palpable:19197::"Not palpable","Faintly palpable","Palpable"}    Gastro- intestinal soft, {Distension:19197::"distended","non-distended"}, {TTP:19197::"TTP in *** quadrant","appropriate tenderness to palpation","non-tender to palpation"}, {Guarding:19197::"Guarding and rebound in *** quadrant","No guarding or rebound"}, {HSM:19197::"HSM present","no HSM"}, {Masses:19197::"Mass present: ***","no masses"}, {Flank:19197::"CVAT on ***","Flank bruit present on ***","no CVAT B"}, {AAA:19197::"Palpable prominent aortic pulse","No palpable prominent aortic pulse"}, {Surgical incision:19197::"Surg incisions: ***","Surgical incisions well healed"," "}  Musculo- skeletal M/S 5/5 throughout {MS:19197::"except ***"," "}, Extremities without ischemic changes {MS:19197::"except ***"," "}, {Edema:19197::"Pitting edema present: ***","Non-pitting edema present: ***","No edema present"}, {Varicosities:19197::"Varicosities present: ***","No visible varicosities "}, {LDS:19197::"Lipodermatosclerosis present: ***","No Lipodermatosclerosis present"}  Neurologic Pain and light touch  intact in extremities{CN:19197::" except for decreased sensation in ***"," "}, Motor exam as listed above    Non-invasive Vascular Imaging   ABI (11/18/17)  R:   ABI: 1.06,   TBI:  0.92  L:   ABI: 1.03,   TBI: 0.91   Radiology   Tagged WBC scan (11/27/17): negative labeled leukocytes scan  I reviewed the tagged WBC scan: no enhancement of main body of ABF   Medical Decision Making   [name] is a 72 y.o. male who presents s/p ABF, B ABF limb excision for infection, B ax-SFA BPG   I discussed with the patient the importance of surveillance of the B ax-SFA BPG and the main body  Would subsequently get: BLE arterial duplex, BLE ABI, and aortoiliac duplex in 3 months.  Continue abx under ID guidance.  Thank you for allowing [place] to participate in this patient's care.   Adele Barthel, MD, FACS [facility] Office: [phone] Pager: [phone]

## 2017-12-16 ENCOUNTER — Ambulatory Visit: Payer: Medicare Other | Admitting: Vascular Surgery

## 2017-12-21 NOTE — Progress Notes (Signed)
Established Open AAA Repair   History of Present Illness   [name] is a 72 y.o. ([birth year]) male who presents cc: left groin bulge.  Prior procedures: 1.  Failed EVAR due 90 degree aortic neck angulation, ABF (01/03/16) 2.  Excsion of B ABF limbs for infect, B ax-SFA BPG with Dr. Donnetta Hutching (09/08/17)  Despite recommendations from Dr. Donnetta Hutching and myself, patient refused to have main body of ABF removed.  Recently we discussed getting a WBC tagged scan to evaluate the residual main body of the aortic graft.  The patient has recently seen his ID specialists who recommends continued abx.  The patient's current sx are: recent somewhat tender bulge in left groin.  Pt continues to take abx.  He denies any fever or chills.   The patient's PMH, PSH, SH, and FamHx were reviewed on 12/23/17 are unchanged from 11/18/17.  Current Outpatient Medications  Medication Sig Dispense Refill  . albuterol (PROAIR HFA) 108 (90 Base) MCG/ACT inhaler Inhale 1-2 puffs into the lungs every 6 (six) hours as needed for wheezing or shortness of breath.     [name] amiodarone (PACERONE) 200 MG tablet TAKE 1 TABLET BY MOUTH ONCE DAILY 90 tablet 3  . aspirin EC 81 MG tablet Take 81 mg by mouth daily.    [name] atorvastatin (LIPITOR) 80 MG tablet Take 1 tablet (80 mg total) by mouth daily at 6 PM. 30 tablet 5  . BREO ELLIPTA 100-25 MCG/INH AEPB Inhale 1 puff into the lungs daily.  1  . budesonide-formoterol (SYMBICORT) 160-4.5 MCG/ACT inhaler Inhale 2 puffs into the lungs 2 (two) times daily. 1 Inhaler 12  . cefUROXime (CEFTIN) 500 MG tablet Take 500 mg by mouth 2 (two) times daily with a meal.    . doxycycline (VIBRA-TABS) 100 MG tablet Take 100 mg by mouth 2 (two) times daily.    [name] escitalopram (LEXAPRO) 20 MG tablet Take 20 mg by mouth daily.    . folic acid (FOLVITE) 1 MG tablet Place 1 tablet (1 mg total) into feeding tube daily. 30 tablet 1  . isosorbide mononitrate (IMDUR) 30 MG 24 hr tablet Take 0.5 tablets (15 mg  total) by mouth daily. 30 tablet 2  . MIRALAX packet Take 17 g by mouth daily as needed for constipation.  1  . mirtazapine (REMERON SOL-TAB) 30 MG disintegrating tablet Take 30 mg by mouth at bedtime.     . mometasone (NASONEX) 50 MCG/ACT nasal spray Place 2 sprays into the nose daily as needed (for sinus).     . nitroGLYCERIN (NITROSTAT) 0.4 MG SL tablet Place 0.4 mg under the tongue every 5 (five) minutes as needed for chest pain.     [name] oxyCODONE (OXY IR/ROXICODONE) 5 MG immediate release tablet Take 15 mg by mouth every 4 (four) hours. For chronic post op pain    . pantoprazole (PROTONIX) 40 MG tablet Take 1 tablet (40 mg total) by mouth daily. 30 tablet 1  . rivaroxaban (XARELTO) 20 MG TABS tablet Take 1 tablet (20 mg total) by mouth daily with supper. 30 tablet 5  . thiamine 100 MG tablet Take 1 tablet (100 mg total) by mouth daily. 30 tablet 1  . tiotropium (SPIRIVA HANDIHALER) 18 MCG inhalation capsule Place 18 mcg into inhaler and inhale daily.    [name] tiZANidine (ZANAFLEX) 4 MG tablet Take 4-8 mg by mouth See admin instructions. Take 8 mg every morning and take 4 mg at bedtime     No current facility-administered  medications for this visit.     Allergies  Allergen Reactions  . Aspirin Nausea Only  . Ibuprofen Hives    On ROS today: left inguinal hernia, no fever or chills.   Physical Examination   Vitals:   12/23/17 0910  BP: 120/66  Pulse: 73  Resp: 16  Temp: (!) 97.4 F (36.3 C)  TempSrc: Oral  SpO2: 98%  Weight: 115 lb (52.2 kg)  Height: 5\' 6"  (1.676 m)   Body mass index is 18.56 kg/m.  General Alert, O x 3, WD, NAD  Pulmonary Sym exp, good B air movt, CTA B  Cardiac RRR, Nl S1, S2, no Murmurs, No rubs, No S3,S4  Vascular Vessel Right Left  Radial Palpable Palpable  Brachial Palpable Palpable  Carotid Palpable, No Bruit Palpable, No Bruit  Aorta Not palpable N/A  Femoral Palpable graft pulse Palpable graft pulse  Popliteal Not palpable Not palpable  PT  Faintly palpable Faintly palpable  DP Palpable Palpable    Gastro- intestinal soft, non-distended, non-tender to palpation, No guarding or rebound, no HSM, no masses, no CVAT B, No palpable prominent aortic pulse,  left groin: +inguinal hernia, easily reducible  Musculo- skeletal M/S 5/5 throughout  , Extremities without ischemic changes  , No edema present, No visible varicosities , No Lipodermatosclerosis present  Neurologic Pain and light touch intact in extremities , Motor exam as listed above    Non-invasive Vascular Imaging   ABI (11/18/17)  R:   ABI: 1.06,   TBI:  0.92  L:   ABI: 1.03  TBI: 0.91   Radiology   Tagged WBC Scan (Date: 0.11/27/17): Negative labeled leukocytes scan  I reviewed the tagged WBC scan: no enhancement of the main body of the ABF   Medical Decision Making   [name] is a 72 y.o. male who presents s/p ABF, B ABF limb excision for infection, B ax-SFA BPF, L inguinal hernia   Fortunately, tagged WBC scan is negative for main body infection.  Will continue with ID's abx suppression plan.  Pt will need close follow up to evaluate for development of a proximal aortic pseudoaneurysm.  Pt will follow up in 3 months for: aortoiliac duplex, BLE ABI, BLE arterial duplex.  In regards to his [facility], pt will not be a candidate for repair until aortic graft infection issue is resolved.  Large hernias such as his should be relatively safe as the sac is easily reduced.  Thank you for allowing [place] to participate in this patient's care.   Adele Barthel, MD, FACS [facility] [place] Office: [phone] Pager: [phone]

## 2017-12-23 ENCOUNTER — Encounter: Payer: Self-pay | Admitting: Vascular Surgery

## 2017-12-23 ENCOUNTER — Ambulatory Visit (INDEPENDENT_AMBULATORY_CARE_PROVIDER_SITE_OTHER): Payer: Medicare Other | Admitting: Vascular Surgery

## 2017-12-23 ENCOUNTER — Other Ambulatory Visit: Payer: Self-pay

## 2017-12-23 VITALS — BP 120/66 | HR 73 | Temp 97.4°F | Resp 16 | Ht 66.0 in | Wt 115.0 lb

## 2017-12-23 DIAGNOSIS — T827XXD Infection and inflammatory reaction due to other cardiac and vascular devices, implants and grafts, subsequent encounter: Secondary | ICD-10-CM

## 2017-12-23 DIAGNOSIS — R069 Unspecified abnormalities of breathing: Secondary | ICD-10-CM | POA: Diagnosis not present

## 2017-12-24 DIAGNOSIS — G894 Chronic pain syndrome: Secondary | ICD-10-CM | POA: Diagnosis not present

## 2017-12-24 DIAGNOSIS — Z79899 Other long term (current) drug therapy: Secondary | ICD-10-CM | POA: Diagnosis not present

## 2017-12-24 DIAGNOSIS — Z79891 Long term (current) use of opiate analgesic: Secondary | ICD-10-CM | POA: Diagnosis not present

## 2018-01-04 DIAGNOSIS — G549 Nerve root and plexus disorder, unspecified: Secondary | ICD-10-CM | POA: Diagnosis not present

## 2018-01-04 DIAGNOSIS — G8928 Other chronic postprocedural pain: Secondary | ICD-10-CM | POA: Diagnosis not present

## 2018-01-04 DIAGNOSIS — M961 Postlaminectomy syndrome, not elsewhere classified: Secondary | ICD-10-CM | POA: Diagnosis not present

## 2018-01-04 DIAGNOSIS — I5020 Unspecified systolic (congestive) heart failure: Secondary | ICD-10-CM | POA: Diagnosis not present

## 2018-01-06 ENCOUNTER — Other Ambulatory Visit: Payer: Self-pay

## 2018-01-06 DIAGNOSIS — I714 Abdominal aortic aneurysm, without rupture, unspecified: Secondary | ICD-10-CM

## 2018-01-06 DIAGNOSIS — I739 Peripheral vascular disease, unspecified: Secondary | ICD-10-CM

## 2018-01-06 DIAGNOSIS — I6529 Occlusion and stenosis of unspecified carotid artery: Secondary | ICD-10-CM

## 2018-01-06 DIAGNOSIS — I722 Aneurysm of renal artery: Secondary | ICD-10-CM

## 2018-01-08 ENCOUNTER — Other Ambulatory Visit: Payer: Self-pay

## 2018-01-08 ENCOUNTER — Inpatient Hospital Stay (HOSPITAL_COMMUNITY): Payer: Medicare Other

## 2018-01-08 ENCOUNTER — Encounter (HOSPITAL_COMMUNITY): Payer: Self-pay

## 2018-01-08 ENCOUNTER — Emergency Department (HOSPITAL_COMMUNITY): Payer: Medicare Other

## 2018-01-08 ENCOUNTER — Inpatient Hospital Stay (HOSPITAL_COMMUNITY)
Admission: EM | Admit: 2018-01-08 | Discharge: 2018-01-10 | DRG: 190 | Disposition: A | Discharge status: Home / Self Care | Payer: Medicare Other | Attending: Internal Medicine | Admitting: Internal Medicine

## 2018-01-08 DIAGNOSIS — E8771 Transfusion associated circulatory overload: Secondary | ICD-10-CM | POA: Diagnosis not present

## 2018-01-08 DIAGNOSIS — J181 Lobar pneumonia, unspecified organism: Secondary | ICD-10-CM | POA: Diagnosis present

## 2018-01-08 DIAGNOSIS — I5043 Acute on chronic combined systolic (congestive) and diastolic (congestive) heart failure: Secondary | ICD-10-CM | POA: Diagnosis present

## 2018-01-08 DIAGNOSIS — J9610 Chronic respiratory failure, unspecified whether with hypoxia or hypercapnia: Secondary | ICD-10-CM | POA: Diagnosis present

## 2018-01-08 DIAGNOSIS — I471 Supraventricular tachycardia: Secondary | ICD-10-CM | POA: Diagnosis not present

## 2018-01-08 DIAGNOSIS — Z9981 Dependence on supplemental oxygen: Secondary | ICD-10-CM | POA: Diagnosis not present

## 2018-01-08 DIAGNOSIS — I4891 Unspecified atrial fibrillation: Secondary | ICD-10-CM | POA: Diagnosis not present

## 2018-01-08 DIAGNOSIS — I440 Atrioventricular block, first degree: Secondary | ICD-10-CM | POA: Diagnosis not present

## 2018-01-08 DIAGNOSIS — R531 Weakness: Secondary | ICD-10-CM | POA: Diagnosis not present

## 2018-01-08 DIAGNOSIS — I739 Peripheral vascular disease, unspecified: Secondary | ICD-10-CM | POA: Diagnosis not present

## 2018-01-08 DIAGNOSIS — J811 Chronic pulmonary edema: Secondary | ICD-10-CM | POA: Diagnosis not present

## 2018-01-08 DIAGNOSIS — I959 Hypotension, unspecified: Secondary | ICD-10-CM | POA: Diagnosis present

## 2018-01-08 DIAGNOSIS — I5023 Acute on chronic systolic (congestive) heart failure: Secondary | ICD-10-CM | POA: Diagnosis not present

## 2018-01-08 DIAGNOSIS — Z8673 Personal history of transient ischemic attack (TIA), and cerebral infarction without residual deficits: Secondary | ICD-10-CM | POA: Diagnosis not present

## 2018-01-08 DIAGNOSIS — Z66 Do not resuscitate: Secondary | ICD-10-CM | POA: Diagnosis present

## 2018-01-08 DIAGNOSIS — J441 Chronic obstructive pulmonary disease with (acute) exacerbation: Secondary | ICD-10-CM | POA: Diagnosis not present

## 2018-01-08 DIAGNOSIS — M542 Cervicalgia: Secondary | ICD-10-CM | POA: Diagnosis present

## 2018-01-08 DIAGNOSIS — J189 Pneumonia, unspecified organism: Secondary | ICD-10-CM | POA: Diagnosis present

## 2018-01-08 DIAGNOSIS — I110 Hypertensive heart disease with heart failure: Secondary | ICD-10-CM | POA: Diagnosis present

## 2018-01-08 DIAGNOSIS — I255 Ischemic cardiomyopathy: Secondary | ICD-10-CM | POA: Diagnosis present

## 2018-01-08 DIAGNOSIS — I2510 Atherosclerotic heart disease of native coronary artery without angina pectoris: Secondary | ICD-10-CM | POA: Diagnosis not present

## 2018-01-08 DIAGNOSIS — Z8679 Personal history of other diseases of the circulatory system: Secondary | ICD-10-CM

## 2018-01-08 DIAGNOSIS — R0603 Acute respiratory distress: Secondary | ICD-10-CM | POA: Diagnosis not present

## 2018-01-08 DIAGNOSIS — Z886 Allergy status to analgesic agent status: Secondary | ICD-10-CM

## 2018-01-08 DIAGNOSIS — I252 Old myocardial infarction: Secondary | ICD-10-CM | POA: Diagnosis not present

## 2018-01-08 DIAGNOSIS — G8929 Other chronic pain: Secondary | ICD-10-CM | POA: Diagnosis present

## 2018-01-08 DIAGNOSIS — Z955 Presence of coronary angioplasty implant and graft: Secondary | ICD-10-CM

## 2018-01-08 DIAGNOSIS — Z79899 Other long term (current) drug therapy: Secondary | ICD-10-CM

## 2018-01-08 DIAGNOSIS — J9811 Atelectasis: Secondary | ICD-10-CM | POA: Diagnosis not present

## 2018-01-08 DIAGNOSIS — E785 Hyperlipidemia, unspecified: Secondary | ICD-10-CM | POA: Diagnosis present

## 2018-01-08 DIAGNOSIS — J440 Chronic obstructive pulmonary disease with acute lower respiratory infection: Secondary | ICD-10-CM | POA: Diagnosis present

## 2018-01-08 DIAGNOSIS — Z792 Long term (current) use of antibiotics: Secondary | ICD-10-CM

## 2018-01-08 DIAGNOSIS — N179 Acute kidney failure, unspecified: Secondary | ICD-10-CM | POA: Diagnosis not present

## 2018-01-08 DIAGNOSIS — J449 Chronic obstructive pulmonary disease, unspecified: Secondary | ICD-10-CM | POA: Diagnosis not present

## 2018-01-08 DIAGNOSIS — I248 Other forms of acute ischemic heart disease: Secondary | ICD-10-CM | POA: Diagnosis present

## 2018-01-08 DIAGNOSIS — Z7901 Long term (current) use of anticoagulants: Secondary | ICD-10-CM

## 2018-01-08 DIAGNOSIS — R7989 Other specified abnormal findings of blood chemistry: Secondary | ICD-10-CM | POA: Diagnosis not present

## 2018-01-08 DIAGNOSIS — R069 Unspecified abnormalities of breathing: Secondary | ICD-10-CM | POA: Diagnosis not present

## 2018-01-08 DIAGNOSIS — Y848 Other medical procedures as the cause of abnormal reaction of the patient, or of later complication, without mention of misadventure at the time of the procedure: Secondary | ICD-10-CM | POA: Diagnosis not present

## 2018-01-08 DIAGNOSIS — F17210 Nicotine dependence, cigarettes, uncomplicated: Secondary | ICD-10-CM | POA: Diagnosis present

## 2018-01-08 DIAGNOSIS — D649 Anemia, unspecified: Secondary | ICD-10-CM | POA: Diagnosis present

## 2018-01-08 DIAGNOSIS — R0602 Shortness of breath: Secondary | ICD-10-CM | POA: Diagnosis not present

## 2018-01-08 DIAGNOSIS — Z7982 Long term (current) use of aspirin: Secondary | ICD-10-CM

## 2018-01-08 LAB — COMPREHENSIVE METABOLIC PANEL
ALK PHOS: 77 U/L (ref 38–126)
ALT: 10 U/L — ABNORMAL LOW (ref 17–63)
AST: 19 U/L (ref 15–41)
Albumin: 3.5 g/dL (ref 3.5–5.0)
Anion gap: 12 (ref 5–15)
BILIRUBIN TOTAL: 0.7 mg/dL (ref 0.3–1.2)
BUN: 26 mg/dL — AB (ref 6–20)
CALCIUM: 8.8 mg/dL — AB (ref 8.9–10.3)
CO2: 20 mmol/L — ABNORMAL LOW (ref 22–32)
Chloride: 107 mmol/L (ref 101–111)
Creatinine, Ser: 1.3 mg/dL — ABNORMAL HIGH (ref 0.61–1.24)
GFR calc Af Amer: >60 mL/min (ref >60)
GFR, EST NON AFRICAN AMERICAN: 54 mL/min — AB (ref >60)
Glucose, Bld: 104 mg/dL — ABNORMAL HIGH (ref 65–99)
POTASSIUM: 5 mmol/L (ref 3.5–5.1)
Sodium: 139 mmol/L (ref 135–145)
TOTAL PROTEIN: 7.5 g/dL (ref 6.5–8.1)

## 2018-01-08 LAB — CBC WITH DIFFERENTIAL/PLATELET
Basophils Absolute: 0 10*3/uL (ref 0.0–0.1)
Basophils Relative: 0 %
Eosinophils Absolute: 0 10*3/uL (ref 0.0–0.7)
Eosinophils Relative: 0 %
HEMATOCRIT: 25.1 % — AB (ref 39.0–52.0)
HEMOGLOBIN: 6.8 g/dL — AB (ref 13.0–17.0)
LYMPHS PCT: 8 %
Lymphs Abs: 1.2 10*3/uL (ref 0.7–4.0)
MCH: 21.1 pg — ABNORMAL LOW (ref 26.0–34.0)
MCHC: 27.1 g/dL — AB (ref 30.0–36.0)
MCV: 78 fL (ref 78.0–100.0)
MONO ABS: 1.3 10*3/uL — AB (ref 0.1–1.0)
MONOS PCT: 8 %
NEUTROS ABS: 12.6 10*3/uL — AB (ref 1.7–7.7)
Neutrophils Relative %: 84 %
Platelets: 464 10*3/uL — ABNORMAL HIGH (ref 150–400)
RBC: 3.22 MIL/uL — ABNORMAL LOW (ref 4.22–5.81)
RDW: 18.5 % — AB (ref 11.5–15.5)
WBC: 15 10*3/uL — ABNORMAL HIGH (ref 4.0–10.5)

## 2018-01-08 LAB — I-STAT VENOUS BLOOD GAS, ED
ACID-BASE DEFICIT: 7 mmol/L — AB (ref 0.0–2.0)
BICARBONATE: 22 mmol/L (ref 20.0–28.0)
O2 SAT: 71 %
PCO2 VEN: 61.8 mmHg — AB (ref 44.0–60.0)
TCO2: 24 mmol/L (ref 22–32)
pH, Ven: 7.159 — CL (ref 7.250–7.430)
pO2, Ven: 48 mmHg — ABNORMAL HIGH (ref 32.0–45.0)

## 2018-01-08 LAB — MRSA PCR SCREENING: MRSA by PCR: NEGATIVE

## 2018-01-08 LAB — HEMOGLOBIN AND HEMATOCRIT, BLOOD
HEMATOCRIT: 23.7 % — AB (ref 39.0–52.0)
HEMOGLOBIN: 6.6 g/dL — AB (ref 13.0–17.0)

## 2018-01-08 LAB — I-STAT ARTERIAL BLOOD GAS, ED
ACID-BASE DEFICIT: 4 mmol/L — AB (ref 0.0–2.0)
Bicarbonate: 20.3 mmol/L (ref 20.0–28.0)
O2 SAT: 99 %
PH ART: 7.382 (ref 7.350–7.450)
PO2 ART: 161 mmHg — AB (ref 83.0–108.0)
TCO2: 21 mmol/L — ABNORMAL LOW (ref 22–32)
pCO2 arterial: 34 mmHg (ref 32.0–48.0)

## 2018-01-08 LAB — PREPARE RBC (CROSSMATCH)

## 2018-01-08 LAB — D-DIMER, QUANTITATIVE (NOT AT ARMC): D DIMER QUANT: 2.45 ug{FEU}/mL — AB (ref 0.00–0.50)

## 2018-01-08 LAB — I-STAT TROPONIN, ED: TROPONIN I, POC: 0.18 ng/mL — AB (ref 0.00–0.08)

## 2018-01-08 MED ORDER — ATORVASTATIN CALCIUM 80 MG PO TABS
80.0000 mg | ORAL_TABLET | Freq: Every day | ORAL | Status: DC
  Administered 2018-01-09: 80 mg via ORAL
  Filled 2018-01-08: qty 1

## 2018-01-08 MED ORDER — FLUTICASONE FUROATE-VILANTEROL 100-25 MCG/INH IN AEPB
1.0000 | INHALATION_SPRAY | Freq: Every day | RESPIRATORY_TRACT | Status: DC
  Administered 2018-01-09 – 2018-01-10 (×2): 1 via RESPIRATORY_TRACT
  Filled 2018-01-08: qty 28

## 2018-01-08 MED ORDER — AMIODARONE HCL 200 MG PO TABS
200.0000 mg | ORAL_TABLET | Freq: Every day | ORAL | Status: DC
  Administered 2018-01-09 – 2018-01-10 (×2): 200 mg via ORAL
  Filled 2018-01-08 (×2): qty 1

## 2018-01-08 MED ORDER — AZITHROMYCIN 250 MG PO TABS
250.0000 mg | ORAL_TABLET | Freq: Every day | ORAL | Status: DC
  Administered 2018-01-09 – 2018-01-10 (×2): 250 mg via ORAL
  Filled 2018-01-08 (×2): qty 1

## 2018-01-08 MED ORDER — SODIUM CHLORIDE 0.9 % IV SOLN
500.0000 mg | Freq: Once | INTRAVENOUS | Status: AC
  Administered 2018-01-08: 500 mg via INTRAVENOUS
  Filled 2018-01-08: qty 500

## 2018-01-08 MED ORDER — ALBUTEROL SULFATE (2.5 MG/3ML) 0.083% IN NEBU: 2.5000 mg | INHALATION_SOLUTION | RESPIRATORY_TRACT | Status: DC | PRN

## 2018-01-08 MED ORDER — IPRATROPIUM-ALBUTEROL 0.5-2.5 (3) MG/3ML IN SOLN: 3.0000 mL | RESPIRATORY_TRACT | Status: DC

## 2018-01-08 MED ORDER — SENNOSIDES-DOCUSATE SODIUM 8.6-50 MG PO TABS: 1.0000 | ORAL_TABLET | Freq: Every evening | ORAL | Status: DC | PRN

## 2018-01-08 MED ORDER — MOMETASONE FURO-FORMOTEROL FUM 200-5 MCG/ACT IN AERO: 2.0000 | INHALATION_SPRAY | Freq: Two times a day (BID) | RESPIRATORY_TRACT | Status: DC

## 2018-01-08 MED ORDER — SODIUM CHLORIDE 0.9 % IV SOLN: Freq: Once | INTRAVENOUS | Status: DC

## 2018-01-08 MED ORDER — ALBUTEROL (5 MG/ML) CONTINUOUS INHALATION SOLN
10.0000 mg/h | INHALATION_SOLUTION | Freq: Once | RESPIRATORY_TRACT | Status: AC
  Administered 2018-01-08: 10 mg/h via RESPIRATORY_TRACT
  Filled 2018-01-08: qty 20

## 2018-01-08 MED ORDER — CEFTRIAXONE SODIUM 1 G IJ SOLR
1.0000 g | Freq: Once | INTRAMUSCULAR | Status: AC
  Administered 2018-01-08: 1 g via INTRAVENOUS
  Filled 2018-01-08: qty 10

## 2018-01-08 MED ORDER — FUROSEMIDE 10 MG/ML IJ SOLN
40.0000 mg | Freq: Once | INTRAMUSCULAR | Status: AC
  Administered 2018-01-08: 40 mg via INTRAVENOUS
  Filled 2018-01-08: qty 4

## 2018-01-08 MED ORDER — TIZANIDINE HCL 4 MG PO TABS
4.0000 mg | ORAL_TABLET | Freq: Three times a day (TID) | ORAL | Status: DC | PRN
  Administered 2018-01-09 – 2018-01-10 (×3): 4 mg via ORAL
  Filled 2018-01-08 (×4): qty 1

## 2018-01-08 MED ORDER — SODIUM CHLORIDE 0.9 % IV SOLN
INTRAVENOUS | Status: DC
  Administered 2018-01-08: 16:00:00 via INTRAVENOUS

## 2018-01-08 MED ORDER — VITAMIN B-1 100 MG PO TABS
100.0000 mg | ORAL_TABLET | Freq: Every day | ORAL | Status: DC
  Administered 2018-01-09: 100 mg via ORAL
  Filled 2018-01-08: qty 1

## 2018-01-08 MED ORDER — MIRTAZAPINE 45 MG PO TABS
45.0000 mg | ORAL_TABLET | Freq: Every day | ORAL | Status: DC
  Administered 2018-01-09: 45 mg via ORAL
  Filled 2018-01-08: qty 3
  Filled 2018-01-08 (×2): qty 1

## 2018-01-08 MED ORDER — SODIUM CHLORIDE 0.9 % IV SOLN
1.0000 g | Freq: Once | INTRAVENOUS | Status: AC
  Administered 2018-01-08: 1 g via INTRAVENOUS
  Filled 2018-01-08: qty 10

## 2018-01-08 MED ORDER — SODIUM CHLORIDE 0.9 % IV SOLN
2.0000 g | INTRAVENOUS | Status: DC
  Administered 2018-01-09: 2 g via INTRAVENOUS
  Filled 2018-01-08 (×2): qty 20

## 2018-01-08 NOTE — Progress Notes (Signed)
Patient seen at bedside per request of day team. Briefly, he has a history of CAD, HFrEF (EF 40-45%), COPD, PAD, Atrial Fibrillation on Xarelto, and recently infected vascular bypass graft who is admitted for acute respiratory distress secondary to suspected CAP and treated with antibiotics and BiPAP with improvement. He was noted to be anemic (Hgb 6.8; baseline ~8) and transfused PRBCs and shortly afterward developed increased work of breathing requiring placement back on BiPAP, discontinuation of blood transfusion, and he was given IV lasix for suspected pulmonary edema.  His daughter is at bedside when seen. He maintains on BiPAP but is conversant and providing appropriate history without respiratory distress. He reports feeling that his breathing is improving. He is requesting ice water and wants to try some time off BiPAP for this. He denies any chest pain, fevers, or further diaphoresis. He is making urine since receiving Lasix. On exam, heart rate is regular and slightly tachycardic. Wheezing is present in bilateral upper lung fields and left lower lung field, no wheezing noted at the right lower lung field. No pedal edema. He is alert and oriented.   Will continue current plan of care. Repeat labs in AM to assess Hgb, or sooner if any signs of obvious bleeding. If he requires further blood transfusions, would recommend transfusing slowly and consider giving with lasix. Continue prn BiPAP. Strict I/Os. He is confirmed DNR/DNI.  Zada Finders, MD Internal Medicine PGY-3

## 2018-01-08 NOTE — ED Provider Notes (Signed)
[facility] EMERGENCY DEPARTMENT Provider Note   CSN: [id number] Arrival date & time: 01/08/18  1326     History   Chief Complaint Chief Complaint  Patient presents with  . Shortness of Breath    HPI [name] is a 72 y.o. male.  Patient is a 72 year old male with a history of cardiomyopathy, CHF, COPD, coronary artery disease status post stenting in July of last year, renal artery dissection, stroke, ongoing tobacco abuse presenting today with shortness of breath.  Patient states the shortness of breath and chest discomfort started last night and worsened into today.  He was trying to use his inhalers at home but he was so short of breath he could not do it.  He denies fever and is only coughing up some clear sputum.  He does not take steroids routinely and is been using his medications as prescribed.  EMS states when they arrived patient was in extremis diaphoretic, tachypneic and cannot speak.  He was placed on BiPAP and given 125 of Solu-Medrol, 2 g of magnesium, 15 mg of albuterol and Atrovent.  Patient has not significantly improved per EMS.  The history is provided by the patient and the EMS personnel.  Shortness of Breath  This is a recurrent problem. Duration: started last night. The problem occurs continuously.The current episode started 12 to 24 hours ago. The problem has been rapidly worsening. Associated symptoms include cough, sputum production, wheezing and chest pain. Pertinent negatives include no fever, no abdominal pain, no leg pain, no leg swelling and no claudication. It is unknown what precipitated the problem. He has tried inhaled steroids and beta-agonist inhalers for the symptoms. He has had prior hospitalizations. He has had prior ED visits. Associated medical issues include COPD and CAD.    Past Medical History:  Diagnosis Date  . AAA (abdominal aortic aneurysm) ([place])   . AAA (abdominal aortic aneurysm) without rupture ([place])   . Acute  respiratory failure ([place]) 03/13/2017  . Anxiety   . Atrial fibrillation (HCC)     Brief episode of  . Atrial fibrillation with rapid ventricular response ([place]) 01/06/2016  . Cardiomyopathy, ischemic 10/27/2014  . CHF (congestive heart failure) ([place])   . COPD (chronic obstructive pulmonary disease) ([place])   . Coronary artery disease    Multivessel, S/p stenting to LCx. LHC 03/16/17: unchanged MVD- 50%mid LAD, 85% 1st daig, 100% prox circ, 100% prox RCA  . Depression   . Dyslipidemia   . Headache   . Hyperlipidemia   . Hypertension   . Myocardial infarction ([place])   . Peripheral vascular disease ([place])   . Renal artery dissection ([place])   . Shortness of breath dyspnea   . Stroke ([place])   . Tobacco dependence   . Ventricular dysfunction    Mild Left    Patient Active Problem List   Diagnosis Date Noted  . Infection of vascular bypass graft ([place]) 09/09/2017  . Postoperative groin pseudoaneurysm ([place]) 09/08/2017  . PAD (peripheral artery disease) ([place]) 09/08/2017  . Hypotension 03/19/2017  . Bradycardia 03/19/2017  . Anemia 03/19/2017  . Hypokalemia 03/19/2017  . NSTEMI (non-ST elevated myocardial infarction) ([place])   . Coagulase negative Staphylococcus bacteremia 03/16/2017  . Demand ischemia of myocardium: Severe 3 vessel CAD, with hypoxic respiratory failure 03/16/2017  . Elevated troponin   . Pressure injury of skin 03/11/2017  . Acute respiratory failure ([place]) 03/10/2017  . Chronic obstructive pulmonary disease ([place])   . Atrial fibrillation with rapid ventricular response (  [place]) 01/06/2016  . Abdominal aortic aneurysm (AAA) without rupture ([place]) 01/03/2016  . S/P AAA (abdominal aortic aneurysm) repair   . Cigarette smoker   . Acute respiratory failure with hypoxia ([place])   . Systolic congestive heart failure ([place])   . Cardiomyopathy, ischemic 10/27/2014  . Chronic cough   . Pulmonary infiltrate   . COPD  GOLD III/ still smoking  10/15/2014  . Bronchiectasis with acute  exacerbation ([place]) 10/15/2014  . Tobacco abuse disorder 10/15/2014  . Persistent atrial fibrillation ([place])   . SVT (supraventricular tachycardia) ([place]) 10/14/2014  . 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 Abdominal mass 03/18/2013  . AAA (abdominal aortic aneurysm) without rupture ([place]) 03/18/2013  . Weight loss 05/07/2011  . HYPERCHOLESTEROLEMIA  IIA 09/18/2008  . HYPERTENSION, BENIGN 09/18/2008  . CAD, NATIVE VESSEL 09/18/2008    Past Surgical History:  Procedure Laterality Date  . ABDOMINAL AORTIC ANEURYSM REPAIR  01/03/2016   Procedure: ANEURYSM ABDOMINAL AORTIC REPAIR USING 14MM X 8MM X40CM HEMASHIELD GOLD GRAFT;  Surgeon: Conrad Elfrida, MD;  Location: [facility] OR;  Service: Vascular;;  . ABDOMINAL AORTIC ENDOVASCULAR STENT GRAFT N/A 01/03/2016   Procedure: ABDOMINAL AORTIC ENDOVASCULAR STENT GRAFT IMPLANTED/EXPLANTED;  Surgeon: Conrad Plymouth, MD;  Location: [facility];  Service: Vascular;  Laterality: N/A;  . AXILLARY-FEMORAL BYPASS GRAFT Bilateral 09/08/2017   Procedure: BYPASS GRAFT AXILLA-BIFEMORAL;  Surgeon: Rosetta Posner, MD;  Location: [facility];  Service: Vascular;  Laterality: Bilateral;  . BACK SURGERY     cervical  . FALSE ANEURYSM REPAIR Bilateral 09/08/2017   Procedure: Bilateral Groin Exploration, Removal of Prosthetic graft. Vein Patch Closure of bilateral common femoral Arteries.;  Surgeon: Rosetta Posner, MD;  Location[facility] OR;  Service: Vascular;  Laterality: Bilateral;  . HAND SURGERY Left    thumb surgery  . HERNIA REPAIR Right   . LEFT HEART CATH AND CORONARY ANGIOGRAPHY N/A 03/16/2017   Procedure: Left Heart Cath and Coronary Angiography;  Surgeon: Martinique, Peter M, MD;  Location: [facility] CV LAB;  Service: Cardiovascular;  Laterality: N/A;  . LEFT HEART CATHETERIZATION WITH CORONARY ANGIOGRAM N/A 10/27/2014   Procedure: LEFT HEART CATHETERIZATION WITH CORONARY ANGIOGRAM;  Surgeon:  Blane Ohara, MD;  Location: [facility] CATH LAB;  Service: Cardiovascular;  Laterality: N/A;  . NECK SURGERY    . stent     pt unsure of location  . US ECHOCARDIOGRAPHY  12-26-2008   EF 40-45%  . WRIST SURGERY Right         Home Medications    Prior to Admission medications   Medication Sig Start Date End Date Taking? Authorizing Provider  albuterol (PROAIR HFA) 108 (90 Base) MCG/ACT inhaler Inhale 1-2 puffs into the lungs every 6 (six) hours as needed for wheezing or shortness of breath.  02/23/14   [provider]  amiodarone (PACERONE) 200 MG tablet TAKE 1 TABLET BY MOUTH ONCE DAILY 04/15/17   Nahser, Wonda Cheng, MD  aspirin EC 81 MG tablet Take 81 mg by mouth daily.    [provider]  atorvastatin (LIPITOR) 80 MG tablet Take 1 tablet (80 mg total) by mouth daily at 6 PM. 10/17/14   Meng, [place], PA  BREO ELLIPTA 100-25 MCG/INH AEPB Inhale 1 puff into the lungs daily. 11/17/15   [provider]  budesonide-formoterol (SYMBICORT) 160-4.5 MCG/ACT inhaler Inhale 2 puffs into the lungs 2 (two) times daily. 01/01/17   Wert,  Christena Deem, MD  cefUROXime (CEFTIN) 500 MG tablet Take 500 mg by mouth 2 (two) times daily with a meal.    [provider]  doxycycline (VIBRA-TABS) 100 MG tablet Take 100 mg by mouth 2 (two) times daily.    [provider]  escitalopram (LEXAPRO) 20 MG tablet Take 20 mg by mouth daily.    [provider]  folic acid (FOLVITE) 1 MG tablet Place 1 tablet (1 mg total) into feeding tube daily. 03/18/17   Reyne Dumas, MD  isosorbide mononitrate (IMDUR) 30 MG 24 hr tablet Take 0.5 tablets (15 mg total) by mouth daily. 03/17/17   Reyne Dumas, MD  [facility] packet Take 17 g by mouth daily as needed for constipation. 06/13/17   [provider]  mirtazapine (REMERON SOL-TAB) 30 MG disintegrating tablet Take 30 mg by mouth at bedtime.  08/30/15   [provider]  mometasone (NASONEX) 50 MCG/ACT nasal spray Place 2 sprays into  the nose daily as needed (for sinus).  10/17/14   [provider]  nitroGLYCERIN (NITROSTAT) 0.4 MG SL tablet Place 0.4 mg under the tongue every 5 (five) minutes as needed for chest pain.     [provider]  oxyCODONE (OXY IR/ROXICODONE) 5 MG immediate release tablet Take 15 mg by mouth every 4 (four) hours. For chronic post op pain    [provider]  pantoprazole (PROTONIX) 40 MG tablet Take 1 tablet (40 mg total) by mouth daily. 03/18/17   Reyne Dumas, MD  rivaroxaban (XARELTO) 20 MG TABS tablet Take 1 tablet (20 mg total) by mouth daily with supper. 04/10/17   Daune Perch, NP  thiamine 100 MG tablet Take 1 tablet (100 mg total) by mouth daily. 03/18/17   Reyne Dumas, MD  tiotropium (SPIRIVA HANDIHALER) 18 MCG inhalation capsule Place 18 mcg into inhaler and inhale daily.    [provider]  tiZANidine (ZANAFLEX) 4 MG tablet Take 4-8 mg by mouth See admin instructions. Take 8 mg every morning and take 4 mg at bedtime    [provider]    Family History Family History  Problem Relation Age of Onset  . AAA (abdominal aortic aneurysm) Mother   . Cancer Father        bone marrow    Social History Social History   Tobacco Use  . Smoking status: Current Every Day Smoker    Packs/day: 0.50    Years: 20.00    Pack years: 10.00    Types: Cigarettes  . Smokeless tobacco: Never Used  Substance Use Topics  . Alcohol use: No  . Drug use: No     Allergies   Ibuprofen and Aspirin   Review of Systems Review of Systems  Constitutional: Negative for fever.  Respiratory: Positive for cough, sputum production, shortness of breath and wheezing.   Cardiovascular: Positive for chest pain. Negative for claudication and leg swelling.  Gastrointestinal: Negative for abdominal pain.  All other systems reviewed and are negative.    Physical Exam Updated Vital Signs BP 137/90   Pulse (!) 107   Resp (!) 30   SpO2 100%   Physical Exam    Constitutional: He is oriented to person, place, and time. He appears well-developed and well-nourished. No distress.  HENT:  Head: Normocephalic and atraumatic.  Mouth/Throat: Oropharynx is clear and moist.  Eyes: Pupils are equal, round, and reactive to light. Conjunctivae and EOM are normal.  Neck: Normal range of motion. Neck supple.  Cardiovascular: Regular rhythm  and intact distal pulses.  Occasional extrasystoles are present. Tachycardia present.  No murmur heard. Pulmonary/Chest: Accessory muscle usage present. Tachypnea noted. No respiratory distress. He has decreased breath sounds in the right upper field and the left upper field. He has wheezes in the right lower field and the left lower field. He has no rales.  Abdominal: Soft. He exhibits no distension. There is no tenderness. There is no rebound and no guarding.  Musculoskeletal: Normal range of motion. He exhibits no edema or tenderness.  Neurological: He is alert and oriented to person, place, and time.  Skin: Skin is warm. No rash noted. He is diaphoretic. No erythema. There is pallor.  Psychiatric: He has a normal mood and affect. His behavior is normal.  Nursing note and vitals reviewed.    ED Treatments / Results  Labs (all labs ordered are listed, but only abnormal results are displayed) Labs Reviewed  CBC WITH DIFFERENTIAL/PLATELET - Abnormal; Notable for the following components:      Result Value   WBC 15.0 (*)    RBC 3.22 (*)    Hemoglobin 6.8 (*)    HCT 25.1 (*)    MCH 21.1 (*)    MCHC 27.1 (*)    RDW 18.5 (*)    Platelets 464 (*)    Neutro Abs 12.6 (*)    Monocytes Absolute 1.3 (*)    All other components within normal limits  COMPREHENSIVE METABOLIC PANEL - Abnormal; Notable for the following components:   CO2 20 (*)    Glucose, Bld 104 (*)    BUN 26 (*)    Creatinine, Ser 1.30 (*)    Calcium 8.8 (*)    ALT 10 (*)    GFR calc non Af Amer 54 (*)    All other components within normal limits   D-DIMER, QUANTITATIVE (NOT AT [facility]) - Abnormal; Notable for the following components:   D-Dimer, Quant 2.45 (*)    All other components within normal limits  I-STAT TROPONIN, ED - Abnormal; Notable for the following components:   Troponin i, poc 0.18 (*)    All other components within normal limits  I-STAT VENOUS BLOOD GAS, ED - Abnormal; Notable for the following components:   pH, Ven 7.159 (*)    pCO2, Ven 61.8 (*)    pO2, Ven 48.0 (*)    Acid-base deficit 7.0 (*)    All other components within normal limits  HEMOGLOBIN AND HEMATOCRIT, BLOOD  TYPE AND SCREEN  PREPARE RBC (CROSSMATCH)    EKG EKG Interpretation  Date/Time:  Friday Jan 08 2018 13:36:50 EDT Ventricular Rate:  106 PR Interval:    QRS Duration: 154 QT Interval:  349 QTC Calculation: 464 R Axis:   16 Text Interpretation:  Sinus tachycardia Consider right atrial enlargement LVH with secondary repolarization abnormality Anterior infarct, acute (LAD) Confirmed by Blanchie Dessert ([id number]) on 01/08/2018 1:41:47 PM   Radiology Dg Chest Port 1 View  Result Date: 01/08/2018 CLINICAL DATA:  Acute onset shortness of breath last night. EXAM: PORTABLE CHEST 1 VIEW COMPARISON:  Chest x-ray dated September 08, 2017. FINDINGS: The heart size and mediastinal contours are within normal limits. Diffusely increased interstitial markings are slightly more prominent. The lungs remain hyperinflated with emphysematous changes in the upper lobes. New peripheral opacity in the right mid to lower lung. No pneumothorax or large pleural effusion. No acute osseous abnormality. IMPRESSION: 1. Slightly worsened interstitial pulmonary edema with new peripheral opacity in the right mid to lower lung, which could  reflect superimposed pneumonia. 2. COPD. Electronically Signed   By: Titus Dubin M.D.   On: 01/08/2018 14:06    Procedures Procedures (including critical care time)  Medications Ordered in ED Medications  albuterol (PROVENTIL,VENTOLIN)  solution continuous neb (has no administration in time range)     Initial Impression / Assessment and Plan / ED Course  I have reviewed the triage vital signs and the nursing notes.  Pertinent labs & imaging results that were available during my care of the patient were reviewed by me and considered in my medical decision making (see chart for details).     Elderly gentleman with multiple medical problems presenting today in acute respiratory distress.  Patient states symptoms started last night with chest pain and shortness of breath and a worsened.  Patient was in distress when fire arrived and he was placed on CPAP and EMS administered Solu-Medrol, magnesium, albuterol and Atrovent.  They state he is only minimally better upon arrival here.  Patient is still tachypneic, accessory muscle use and wheezing in the bases.  He has no evidence of fluid overload at this time but is still complaining of some chest discomfort. EKG shows a pattern of heart strain but spoke with Dr. Charmian Muff who also at this time does not feel that this is a code STEMI.  Will place patient on an hour-long continuous neb.  Patient is currently full code.  Did discuss with him DNR/DNI but he wanted to wait until his daughter got here. We will continue BiPAP at this time.  CBC, CMP, troponin, VBG, chest x-ray is pending  2:57 PM Patient's labs are consistent with a respiratory acidosis with a pH of 7.15 and a CO2 of 61 without metabolic compensation, mild troponin leak of 0.18, CBC with leukocytosis of 15,000, worsening anemia with a hemoglobin of 6.8 and elevated platelets.  CMP with evidence of acute kidney injury today with a creatinine of 1.3 from baseline of 0.6, d-dimer is elevated however patient is confirmed to still be taking Xarelto and with an x-ray that shows concern for overlying pneumonia feel that there is a better explanation at this time for patient's symptoms other than PE.  Patient will be transfused 2 units of  blood, this could be a result of multiple recent surgeries and patient does have a history of chronic anemia.  Patient was also treated with azithromycin and Rocephin for pneumonia.  Speaking with patient's daughter and the patient together he confirms that he is a DNI.  Daughter also states that patient has recently stopped taking his chronic opiates 1 week ago and he seemed to deteriorate after that. On reevaluation patient seems more comfortable now on BiPAP vital signs are reassuring.   CRITICAL CARE Performed by: Alvie Speltz Total critical care time: 30 minutes Critical care time was exclusive of separately billable procedures and treating other patients. Critical care was necessary to treat or prevent imminent or life-threatening deterioration. Critical care was time spent personally by me on the following activities: development of treatment plan with patient and/or surrogate as well as nursing, discussions with consultants, evaluation of patient's response to treatment, examination of patient, obtaining history from patient or surrogate, ordering and performing treatments and interventions, ordering and review of laboratory studies, ordering and review of radiographic studies, pulse oximetry and re-evaluation of patient's condition.  Final Clinical Impressions(s) / ED Diagnoses   Final diagnoses:  COPD exacerbation ([place])  Community acquired pneumonia, unspecified laterality  Anemia, unspecified type  AKI (acute kidney injury) (  [facility])    ED Discharge Orders    None       Blanchie Dessert, MD 01/08/18 [phone]

## 2018-01-08 NOTE — ED Notes (Signed)
MD at bedside. 

## 2018-01-08 NOTE — ED Triage Notes (Signed)
Pt from home via [facility]; pt c/o increased sob beginning last night last night; wears O2 at home; EMS endorses increased lethargy; pt c/o CP   PTA: 15 albuterol 2 mag 1 atrovent 125 solu medrol  O2 sat 100% on CPAP 147/76 Hr 110 rr 24, labored  Hx stemi, copd

## 2018-01-08 NOTE — Progress Notes (Addendum)
IMTS INTERVAL PROGRESS NOTE  Called by RN at ~6:15pm that patient had respiratory distress, requiring re-placement of BiPAP. RN informed us that he was taken off of BiPAP shortly before and had been doing well on 6L [place]. He was also started on transfusion of his first unit of pRBC around the same time. About 20 minutes into the transfusion, he called out and was found to have increased work of breathing. He was on 6L [place] with O2 at 99%. He was placed on BiPAP due to respiratory distress and pRBC transfusion was stopped. Vitals showed temperature increase from 10F to 99, RR 36, and tachycardia to 115 (previously in 90s).  Went to bedside to evaluate. Upon entering the room, patient had increased work of breathing on BiPAP. On exam, he had accessory muscle use and increased wheezing diffusely and crackles to mid-lung fields, which were not noted on exam from earlier today. Patient has a history of HFrEF (EF 40-45%), not on home diuretics. He was on maintenance 125cc/hr of IVF and has received additional fluids with IV antibiotics. Stat CXR was obtained, which appears to show worsened interstitial edema, particularly on the left side.  Assessment/Plan Suspect most likely etiology for sudden onset respiratory distress 20 minutes into pRBC transfusion is TACO, particularly given hx of HFrEF and fluids he has received in the ED. His anemia is chronic (with baseline Hb around 7-8), thus it is reasonable to hold off on transfusions while we are concerned about possible fluid overload. He also has a history of COPD and received a dose of IV solumedrol 125mg  en route at around 1:30pm. This was discussed with the patient and his daughter Chauncey Reading). They are agreeable to the plan. Patient is confirmed DNR/DNI. - D/c pRBC transfusion - D/c maintenance IVF - IV lasix 40mg  x1 - Duonebs 36mL q4h - Can consider repeat dose of IV solumedrol - Will ask night team to re-assess  Colbert Ewing, MD Internal Medicine, PGY-1

## 2018-01-08 NOTE — H&P (Addendum)
Date: 01/08/2018               Patient Name:  [name] MRN: [id number]  DOB: 12/22/1945 Age / Sex: 72 y.o., male   PCP: Sheral Apley         Medical Service: Internal Medicine Teaching Service         Attending Physician: Dr. Oval Linsey, MD    First Contact: Dr. Ronalee Red Pager: [phone]  Second Contact: Dr. Philipp Ovens Pager: [phone]       After Hours (After 5p/  First Contact Pager: [phone]  weekends / holidays): Second Contact Pager: [phone]   Chief Complaint: shortness of breath  History of Present Illness:  [name] is a 72yo male with PMH of afib, CAD, HFrEF  (TTE 03/2017: EF 40-45%), COPD, PAD, SVT, tobacco use, and recent infection of vascular bypass graft who presents with shortness of breath. Daughter at bedside who assists with history.  He endorses abrupt onset of shortness of breath last night when he was lying flat in bed trying to sleep. He continued to have difficulty breathing until he came to the ER and was placed on BiPAP, which did improve his symptoms. He does have portable oxygen at home, however he states that he only uses 2L as needed. Daughter has noted that he has recently required the oxygen more frequently and she placed him on 4L at home this morning when he was complaining of difficulty breathing. He denies increased cough or sputum production, although does have a cough at baseline. He endorses DOE for the last week. He denies leg swelling or chest pain.   He was on Percocet for chronic neck pain, but 8 days ago, he decided that he no longer wanted to take his pain medications, so he abruptly stopped taking them. He went through a period of withdrawal with shakiness and sweatiness, denied diarrhea. These symptoms have resolved. One of his healthcare providers did recommend he try CBD oil and a muscle relaxer. He reports the CBD oil has provided a little bit of benefit.  He smokes 1/2ppd, denies alcohol use or other illicit drugs. He  lives at home with his son. His daughter is his HCPOA. He is able to administer his own medications and is ambulatory without assistance.  ED Course: - BP 111/77, HR 101, RR 22, O2 100% on BiPAP, no temp recorded - CBC with WBC 15.0, Hb 6.8, MCV 78, plt 464. CMP with Cr 1.30, bicarb 20. Troponin 0.18. ABG 7.382/34/161 on 40% FiO2 on BiPAP - CXR with interstitial pulmonary edema and new infiltrate in right mid-lung field - Started on ceftriaxone and azithromycin. Received solumedrol with EMS.  Meds:  Current Meds  Medication Sig  . albuterol (PROVENTIL) (2.5 MG/3ML) 0.083% nebulizer solution Take 2.5 mg by nebulization every 6 (six) hours as needed for wheezing or shortness of breath.  [name] amiodarone (PACERONE) 200 MG tablet TAKE 1 TABLET BY MOUTH ONCE DAILY  . aspirin EC 81 MG tablet Take 81 mg by mouth daily.  [name] atorvastatin (LIPITOR) 80 MG tablet Take 1 tablet (80 mg total) by mouth daily at 6 PM.  . BREO ELLIPTA 100-25 MCG/INH AEPB Inhale 1 puff into the lungs as needed.   . budesonide-formoterol (SYMBICORT) 160-4.5 MCG/ACT inhaler Inhale 2 puffs into the lungs 2 (two) times daily. (Patient taking differently: Inhale 2 puffs into the lungs 4 (four) times daily as needed. )  . cefUROXime (CEFTIN) 500 MG tablet Take 500 mg by  mouth 2 (two) times daily with a meal.  . folic acid (FOLVITE) 1 MG tablet Place 1 tablet (1 mg total) into feeding tube daily. (Patient taking differently: Take 400 mg by mouth daily. )  . MIRALAX packet Take 17 g by mouth daily.   . mirtazapine (REMERON) 45 MG tablet Take 45 mg by mouth at bedtime.   . nitroGLYCERIN (NITROSTAT) 0.4 MG SL tablet Place 0.4 mg under the tongue every 5 (five) minutes as needed for chest pain.   [name] oxyCODONE (OXY IR/ROXICODONE) 5 MG immediate release tablet Take 15 mg by mouth every 4 (four) hours. For chronic post op pain  . rivaroxaban (XARELTO) 20 MG TABS tablet Take 1 tablet (20 mg total) by mouth daily with supper.  . thiamine 100 MG  tablet Take 1 tablet (100 mg total) by mouth daily. (Patient taking differently: Take 100 mg by mouth at bedtime. )  . tiZANidine (ZANAFLEX) 4 MG tablet Take 4 mg by mouth every 8 (eight) hours as needed. At bedtime  . [DISCONTINUED] albuterol (PROAIR HFA) 108 (90 Base) MCG/ACT inhaler Inhale 1-2 puffs into the lungs every 6 (six) hours as needed for wheezing or shortness of breath.    Allergies: Allergies as of 01/08/2018 - Review Complete 01/08/2018  Allergen Reaction Noted  . Ibuprofen Hives 04/24/2011  . Aspirin Nausea Only 04/24/2011   Past Medical History:  Diagnosis Date  . AAA (abdominal aortic aneurysm) ([place])   . AAA (abdominal aortic aneurysm) without rupture ([place])   . Acute respiratory failure ([place]) 03/13/2017  . Anxiety   . Atrial fibrillation ([facility])     Brief episode of  . Atrial fibrillation with rapid ventricular response ([place]) 01/06/2016  . Cardiomyopathy, ischemic 10/27/2014  . CHF (congestive heart failure) ([place])   . COPD (chronic obstructive pulmonary disease) ([place])   . Coronary artery disease    Multivessel, S/p stenting to LCx. LHC 03/16/17: unchanged MVD- 50%mid LAD, 85% 1st daig, 100% prox circ, 100% prox RCA  . Depression   . Dyslipidemia   . Headache   . Hyperlipidemia   . Hypertension   . Myocardial infarction ([place])   . Peripheral vascular disease ([place])   . 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 Shortness of breath dyspnea   . Stroke ([place])   . Tobacco dependence   . Ventricular dysfunction    Mild Left   Family History: Family History  Problem Relation Age of Onset  . AAA (abdominal aortic aneurysm) Mother   . Cancer Father        bone marrow   Social History:  - smokes 1/2ppd - denies alcohol use - using CBD oil as prescribed  Review of Systems: A complete ROS was negative except as per HPI.  Physical Exam: Blood pressure 125/74, pulse 96, resp. rate (!) 22, SpO2 100 %. GEN: Cachetic male lying in bed. Alert and oriented. No acute distress.    HENT: /AT. Moist mucous membranes. No visible lesions. EYES: Bilateral pupils 42mm. Sclera non-icteric. Conjunctiva clear. RESP: Difficult to assess as patient is on BiPAP, but no clear wheezes or rales. No increased work of breathing on BiPAP. CV: Distant heart sounds. Normal rate and regular rhythm. No murmurs, gallops, or rubs. No LE edema. ABD: Soft. Non-tender. Non-distended. Normoactive bowel sounds. EXT: No edema. Warm and well perfused. SKIN: Horizontal well-healed scar over right clavicle. Vertical midline scar over abdomen. NEURO: Cranial nerves II-XII grossly intact. Able to lift all four extremities against gravity. No apparent audiovisual hallucinations.  Speech fluent and appropriate. PSYCH: Patient is calm and pleasant. Appropriate affect. Well-groomed; speech is appropriate and on-subject.  Labs CBC Latest Ref Rng & Units 01/08/2018 01/08/2018 09/16/2017  WBC 4.0 - 10.5 K/uL - 15.0(H) 7.1  Hemoglobin 13.0 - 17.0 g/dL 6.6(LL) 6.8(LL) 10.5(L)  Hematocrit 39.0 - 52.0 % 23.7(L) 25.1(L) 34.4(L)  Platelets 150 - 400 K/uL - 464(H) 304   CMP Latest Ref Rng & Units 01/08/2018 09/15/2017 09/14/2017  Glucose 65 - 99 mg/dL 104(H) 87 110(H)  BUN 6 - 20 mg/dL 26(H) 11 9  Creatinine 0.61 - 1.24 mg/dL 1.30(H) 0.62 0.62  Sodium 135 - 145 mmol/L 139 139 140  Potassium 3.5 - 5.1 mmol/L 5.0 3.8 3.3(L)  Chloride 101 - 111 mmol/L 107 104 103  CO2 22 - 32 mmol/L 20(L) 28 32  Calcium 8.9 - 10.3 mg/dL 8.8(L) 7.9(L) 7.8(L)  Total Protein 6.5 - 8.1 g/dL 7.5 - -  Total Bilirubin 0.3 - 1.2 mg/dL 0.7 - -  Alkaline Phos 38 - 126 U/L 77 - -  AST 15 - 41 U/L 19 - -  ALT 17 - 63 U/L 10(L) - -   ABG 7.382/34.0/161.0/20.3 on 40% FiO2 BiPAP Troponin 0.18  EKG: personally reviewed my interpretation is sinus tachycardia, very mild inferolateral ST depression, normal intervals  CXR: personally reviewed my interpretation is interstitial pulmonary edema and new infiltrate in right mid-lung field  Assessment &  Plan by Problem: Active Problems:   Pneumonia  Mr. Mazon is a 72yo male with PMH of afib, CAD, HFrEF (TTE 03/2017: EF 40-45%), COPD, PAD, SVT, tobacco use, and recent infection of vascular bypass graft who presents with 1 day of shortness of breath. CXR suggestive of right sided pulmonary infiltrate, concerning for CAP. He was started on antibiotics and placed on BiPAP with improvement in his symptoms.  CAP Endorses acute onset of shortness of breath last night that has persisted to today. He is HDS. No temperature recorded. 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 CXR suggestive of right pulmonary infiltrate. Started on IV antibiotics and BiPAP with relief of his respiratory distress. ABG on BiPAP with normal pH and pCO2. Will trial off BiPAP. Another consideration is that he has been on long-term cefuroxime, so current bacteria could be resistant to ceftriaxone. Discussed with pharmacy, who advised that ceftriaxone provides appropriate gram-positive coverage. - Admit to stepdown - Continue IV azithromycin and ceftriaxone - Low threshold for broadening antibiotics - Monitor fever curve - BiPAP PRN - CBC, BMP in AM  Normocytic Anemia Hb on admission 6.8 (baseline ~8). Receiving 2 units pRBC. Unclear etiology of his acute worsening of chronic anemia. - CBC in AM - Receiving 2 units pRBC  COPD No wheezing on exam, but he did receive a dose of solu-medrol en route. No increased cough or sputum production. Will continue to monitor for now, and consider course of steroids if suspicious of exacerbation. - Continue home Breo and Dulera - Continue albuterol nebulizer 2.5mg  q4h PRN  Afib Currently in sinus rhythm - Cardiac monitoring - Continue home amiodarone 200mg  daily - Hold home xarelto 2/2 anemia  HFrEF (TTE 03/2017: EF 40-45%) Symptoms more consistent with infection rather than  volume overload. - Continue home atorvastatin - Daily weights - I/Os  3 vessel obstructive CAD, s/p multiple stents (most recent in 2007) Hx of HTN On LHC 03/2017, recommended medical management. Patient denies chest pain. Normotensive. -  Hold home aspirin 81mg  in setting of anemia - Continue home atorvastatin 80mg  daily - Hold home imdur 15mg  daily  AAA, s/p open aortobifemoral bypass 0/2774, Complicated by persistent infection of vascular bypass graft, Now s/p bilateral aortobifemoral limb removal and bilateral axillary to superficial femoral artery bypass His course was complicated by ventral incisional hernia s/p repair in Nov 2018. He also required I&D for bilateral groin swelling and purulence. He was re-admitted in January 2019 and treated for infected aortobifemoral graft, s/p bilateral limb removal. Unclear source, but he was admitted in 03/2017 for Staph hominis bacteremia, which could have led to seeding of his graft. He also underwent bilateral axillary to superficial femoral artery bypass. He declined removal of the main body of the ABF graft. Operative Gram stain showed no organisms. Right groin cultures were negative. Diphtheroids grew from his left groin cultures. He received empiric IV vancomycin and ceftazidine during his hospitalization per ID recs and did not want a PICC line for IV antibiotics. He was thus discharged on empiric doxycycline and cefuroxime per ID recs. He remains on chronic suppressive therapy. Daughter reports doxycycline was subsequently discontinued and he remains on cefuroxime up until the time of his current presentation, however I do not see record of this in his chart. Tagged WBC scan in 11/2017 was negative. Patient admitted for pneumonia - discussed antibiotic regimen with pharmacy, they recommend ceftriaxone. - Continue IV azithromycin and ceftriaxone  Diet: NPO VTE PPx: SCDs Code Status: DNR/DNI (confirmed on admission) Dispo: Admit patient to  Inpatient with expected length of stay greater than 2 midnights.  Signed: Colbert Ewing, MD 01/08/2018, 5:04 PM  Pager: Mamie Nick [phone]

## 2018-01-08 NOTE — ED Notes (Signed)
Dr. Simonne Come spoke with pt's daughter, okay with pt being weaned off bi-pap prior to her return

## 2018-01-08 NOTE — ED Notes (Deleted)
Code Stemi paged per [employer] request

## 2018-01-08 NOTE — ED Notes (Signed)
Dr. Simonne Come requested that pt be weaned off bi-pap; daughter left to run an errand prior to MD request, daughter asked that we not take pt off bi-pap until she returned in approximately 2 hours; Dr. Simonne Come notified of pt's inqury, will keep pt on bi-pap until daughter returns

## 2018-01-08 NOTE — ED Notes (Signed)
Dr. [place], stemi doctor paged to Dr. Maryan Rued per her request

## 2018-01-08 NOTE — ED Notes (Signed)
Pt taken off bi-pap, placed on 6 liters; RT aware

## 2018-01-08 NOTE — Progress Notes (Signed)
Pharmacy Antibiotic Note  [name] is a 72 y.o. male admitted on 01/08/2018 with CAP. Pharmacy has been consulted for ceftriaxone dosing. Patient has already received ceftriaxone 1g x1 in the ED. Patient also on PO azithromycin dosed per MD. WBC 15.0, no temp documented.  Plan: Will give additional ceftriaxone 1g IV x1 now to equal 2g Then start ceftriaxone 2g IV q24h F/u C&S, clinical status, LOT  No data recorded.  Recent Labs  Lab 01/08/18 1344  WBC 15.0*  CREATININE 1.30*    CrCl cannot be calculated (Unknown ideal weight.).    Allergies  Allergen Reactions  . Ibuprofen Hives  . Aspirin Nausea Only    Antimicrobials this admission: Ceftriaxone 5/3 >> Azithromycin 5/3 >>  Microbiology results: None  Thank you for allowing pharmacy to be a part of this patient's care.  Mila Merry Gerarda Fraction, PharmD PGY1 Pharmacy Resident Pager: [phone] 01/08/2018 4:59 PM

## 2018-01-08 NOTE — ED Notes (Addendum)
Pt c/o feeling "stuffy", increasing shortness of breath, temp increase to 99 F oral, very anxious, pt requesting to be placed back on bi-pap; paused blood administration, called MD and RT; RT at bedside, MD reports that she will come down and take a look; will continue to monitor

## 2018-01-08 NOTE — ED Notes (Addendum)
Daughter (Katrina) is going to pt's home to pay his electric bill and will be back.  Please call her with any developments [phone].

## 2018-01-09 DIAGNOSIS — I4891 Unspecified atrial fibrillation: Secondary | ICD-10-CM

## 2018-01-09 DIAGNOSIS — I959 Hypotension, unspecified: Secondary | ICD-10-CM

## 2018-01-09 DIAGNOSIS — J189 Pneumonia, unspecified organism: Secondary | ICD-10-CM

## 2018-01-09 DIAGNOSIS — Z7982 Long term (current) use of aspirin: Secondary | ICD-10-CM

## 2018-01-09 DIAGNOSIS — I4589 Other specified conduction disorders: Secondary | ICD-10-CM

## 2018-01-09 DIAGNOSIS — Z8679 Personal history of other diseases of the circulatory system: Secondary | ICD-10-CM

## 2018-01-09 DIAGNOSIS — I5043 Acute on chronic combined systolic (congestive) and diastolic (congestive) heart failure: Secondary | ICD-10-CM

## 2018-01-09 DIAGNOSIS — I471 Supraventricular tachycardia: Secondary | ICD-10-CM

## 2018-01-09 DIAGNOSIS — I739 Peripheral vascular disease, unspecified: Secondary | ICD-10-CM

## 2018-01-09 DIAGNOSIS — I440 Atrioventricular block, first degree: Secondary | ICD-10-CM

## 2018-01-09 DIAGNOSIS — Z9889 Other specified postprocedural states: Secondary | ICD-10-CM

## 2018-01-09 DIAGNOSIS — Z720 Tobacco use: Secondary | ICD-10-CM

## 2018-01-09 DIAGNOSIS — I2510 Atherosclerotic heart disease of native coronary artery without angina pectoris: Secondary | ICD-10-CM

## 2018-01-09 DIAGNOSIS — Z7901 Long term (current) use of anticoagulants: Secondary | ICD-10-CM

## 2018-01-09 DIAGNOSIS — Z955 Presence of coronary angioplasty implant and graft: Secondary | ICD-10-CM

## 2018-01-09 DIAGNOSIS — R7989 Other specified abnormal findings of blood chemistry: Secondary | ICD-10-CM

## 2018-01-09 DIAGNOSIS — J449 Chronic obstructive pulmonary disease, unspecified: Secondary | ICD-10-CM

## 2018-01-09 DIAGNOSIS — E8771 Transfusion associated circulatory overload: Secondary | ICD-10-CM

## 2018-01-09 DIAGNOSIS — D649 Anemia, unspecified: Secondary | ICD-10-CM

## 2018-01-09 DIAGNOSIS — J181 Lobar pneumonia, unspecified organism: Secondary | ICD-10-CM

## 2018-01-09 DIAGNOSIS — I5023 Acute on chronic systolic (congestive) heart failure: Secondary | ICD-10-CM

## 2018-01-09 DIAGNOSIS — Z792 Long term (current) use of antibiotics: Secondary | ICD-10-CM

## 2018-01-09 DIAGNOSIS — Z79899 Other long term (current) drug therapy: Secondary | ICD-10-CM

## 2018-01-09 LAB — CBC
HEMATOCRIT: 21.7 % — AB (ref 39.0–52.0)
HEMATOCRIT: 24.6 % — AB (ref 39.0–52.0)
HEMOGLOBIN: 6.1 g/dL — AB (ref 13.0–17.0)
HEMOGLOBIN: 7.2 g/dL — AB (ref 13.0–17.0)
MCH: 21.4 pg — AB (ref 26.0–34.0)
MCH: 23.2 pg — ABNORMAL LOW (ref 26.0–34.0)
MCHC: 28.1 g/dL — AB (ref 30.0–36.0)
MCHC: 29.3 g/dL — ABNORMAL LOW (ref 30.0–36.0)
MCV: 76.1 fL — ABNORMAL LOW (ref 78.0–100.0)
MCV: 79.4 fL (ref 78.0–100.0)
Platelets: 278 10*3/uL (ref 150–400)
Platelets: 265 10*3/uL (ref 150–400)
RBC: 2.85 MIL/uL — ABNORMAL LOW (ref 4.22–5.81)
RBC: 3.1 MIL/uL — ABNORMAL LOW (ref 4.22–5.81)
RDW: 18 % — AB (ref 11.5–15.5)
RDW: 19.9 % — ABNORMAL HIGH (ref 11.5–15.5)
WBC: 7.6 10*3/uL (ref 4.0–10.5)
WBC: 8.9 10*3/uL (ref 4.0–10.5)

## 2018-01-09 LAB — PREPARE RBC (CROSSMATCH)

## 2018-01-09 LAB — BASIC METABOLIC PANEL
Anion gap: 11 (ref 5–15)
BUN: 35 mg/dL — AB (ref 6–20)
CHLORIDE: 104 mmol/L (ref 101–111)
CO2: 25 mmol/L (ref 22–32)
CREATININE: 1.04 mg/dL (ref 0.61–1.24)
Calcium: 8.7 mg/dL — ABNORMAL LOW (ref 8.9–10.3)
GFR calc Af Amer: >60 mL/min (ref >60)
GFR calc non Af Amer: >60 mL/min (ref >60)
Glucose, Bld: 90 mg/dL (ref 65–99)
Potassium: 3.8 mmol/L (ref 3.5–5.1)
Sodium: 140 mmol/L (ref 135–145)

## 2018-01-09 LAB — URINALYSIS, ROUTINE W REFLEX MICROSCOPIC
Bilirubin Urine: NEGATIVE
Glucose, UA: NEGATIVE mg/dL
Hgb urine dipstick: NEGATIVE
Ketones, ur: NEGATIVE mg/dL
LEUKOCYTES UA: NEGATIVE
NITRITE: NEGATIVE
Protein, ur: NEGATIVE mg/dL
SPECIFIC GRAVITY, URINE: 1.01 (ref 1.005–1.030)
pH: 5 (ref 5.0–8.0)

## 2018-01-09 LAB — TSH: TSH: 0.763 u[IU]/mL (ref 0.350–4.500)

## 2018-01-09 LAB — TROPONIN I: Troponin I: 0.53 ng/mL (ref <0.03)

## 2018-01-09 LAB — GLUCOSE, CAPILLARY: Glucose-Capillary: 85 mg/dL (ref 65–99)

## 2018-01-09 MED ORDER — IPRATROPIUM-ALBUTEROL 0.5-2.5 (3) MG/3ML IN SOLN
3.0000 mL | Freq: Three times a day (TID) | RESPIRATORY_TRACT | Status: DC
  Administered 2018-01-09 – 2018-01-10 (×4): 3 mL via RESPIRATORY_TRACT
  Filled 2018-01-09 (×5): qty 3

## 2018-01-09 MED ORDER — ACETAMINOPHEN 325 MG PO TABS
650.0000 mg | ORAL_TABLET | Freq: Four times a day (QID) | ORAL | Status: DC | PRN
  Administered 2018-01-09 – 2018-01-10 (×3): 650 mg via ORAL
  Filled 2018-01-09 (×3): qty 2

## 2018-01-09 MED ORDER — FUROSEMIDE 10 MG/ML IJ SOLN
20.0000 mg | Freq: Once | INTRAMUSCULAR | Status: AC
  Administered 2018-01-09: 20 mg via INTRAVENOUS
  Filled 2018-01-09: qty 2

## 2018-01-09 MED ORDER — SODIUM CHLORIDE 0.9 % IV SOLN
Freq: Once | INTRAVENOUS | Status: AC
  Administered 2018-01-09: 09:00:00 via INTRAVENOUS

## 2018-01-09 MED ORDER — FUROSEMIDE 10 MG/ML IJ SOLN: 40.0000 mg | Freq: Once | INTRAMUSCULAR | Status: DC

## 2018-01-09 NOTE — Progress Notes (Addendum)
Subjective:  Mr. Brunsman was reclining comfortably in bed this morning. He reports his breathing is significantly improved after the dose of lasix. He denies chest pain. Denies lightheadedness or dizziness. He does state that he is coughing some and is having some mucus production, which he feels is related to the BiPAP. He denies nasal congestion, subjective fevers, or runny nose. He is thirsty, requests Pepsi.  Objective:  Vital signs in last 24 hours: Vitals:   01/08/18 2000 01/08/18 2035 01/08/18 2307 01/09/18 0300  BP:   (!) 84/63 99/64  Pulse:   100 89  Resp:   16 16  Temp:  98.1 F (36.7 C) 98.4 F (36.9 C) 98.9 F (37.2 C)  TempSrc:  Axillary Axillary Oral  SpO2:   100% 100%  Weight: 115 lb 11.9 oz (52.5 kg)     Height: 5\' 6"  (1.676 m)      GEN: Thin male lying in bed in NAD CV: irregularly irregular rhythm, no m/r/g PULM: Bronchial breath sounds in right mid-lung field, occasional wheezing in upper lobes, no crackles. No increased work of breathing on 2L [place] SKIN: Multiple well-healed surgical scars over right clavicle, midline abdomen, and bilateral groins MSK: No LE edema, SCDs in place  Assessment/Plan:  Active Problems:   Pneumonia  Mr. Wolgamott is a 72yo male with PMH of afib, CAD, HFrEF (TTE 03/2017: EF 40-45%), COPD, PAD, SVT, tobacco use, and recent infection of vascular bypass graft who presents with 1 day of shortness of breath, currently treating for CAP with azithromycin and ceftriaxone. He required BiPAP on presentation. He did experience respiratory distress yesterday afternoon after getting IVF with antibiotics and blood transfusion, which was consistent with TACO. He received IV lasix with significant improvement in his symptoms, currently doing well on 2L [place].  CAP Remains afebrile. Vitals stable. Leukocytosis resolved. Currently on day 2 of IV azithromycin and ceftriaxone, and satting well on 2L [place]. He is on intermittent oxygen at home as needed. -  Continue IV azithromycin and ceftriaxone (5/3 -> present) - Low threshold for broadening antibiotics given that he was on chronic suppressive antibiotic therapy - Monitor fever curve - BiPAP PRN - Wean supplemental oxygen as tolerated - CBC, BMP in AM  Acute on chronic anemia Hb on admission 6.8->6.1 this AM (baseline ~8). Experienced TACO after 20 minutes of his first unit of pRBC transfusion, so transfusion was discontinued yesterday. 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 Will screen with UA and FOBT. Will also pre-treat 1u transfusion today with lasix - this has been discussed with the nurse. - CBC in AM - 1 unit pRBC transfusion --- Pretreatment with IV lasix 20mg  x1 - F/u post-transfusion H&H - FOBT - UA negative for hematuria  COPD Mild wheezing in upper lobes on exam. Has received a dose of solu-medrol. Will continue to monitor for now, and consider course of steroids if suspicious of exacerbation, although his presentation seems more consistent with CAP exacerbated by fluid overload. - Continue home Breo - D/c'ed home Dulera - Continue scheduled duonebs 77mL TID - Continue home albuterol nebulizer 2.5mg  q4h PRN  Afib Irregularly irregular rhythm on exam, rate controlled on home amio. TSH normal. No PFTs on record to evaluate for amiodarone-induced lung injury. - Cardiac monitoring - Continue home amiodarone 200mg  daily - Holding home xarelto 2/2 anemia  HFrEF (TTE 03/2017: EF 40-45%) TACO Initially came in without concern for volume overload, however he was  on maintenance fluids, IV Abx, and blood transfusion, which likely precipitated TACO and respiratory distress. He was treated with BiPAP and lasix with significant improvement in his symptoms. Not on home diuretic, but given that he will continue to receive IV Abx and needs pRBC transfusion today, will pre-treat with lasix. - Continue home  atorvastatin - Daily weights - I/Os - IV lasix 20mg  x1  3 vessel obstructive CAD, s/p multiple stents (most recent in 2007) On LHC 03/2017, recommended medical management. Denies chest pain.  - Holding home aspirin 81mg  in setting of anemia - Continue home atorvastatin 80mg  daily  Hx of HTN Hypotension Received IV lasix 40mg  yesterday for TACO and lasix 20 today prior to blood transfusion to prevent volume overload. May have received too much diuresis. Patient is currently asymptomatic with his hypotension, and other vitals stable. Discussed with nurse - will encourage PO hydration. Continue to monitor. - Holding home imdur 15mg  daily - Encouraged PO intake  AAA, s/p open aortobifemoral bypass 05/7281, Complicated by persistent infection of vascular bypass graft, Now s/p bilateral aortobifemoral limb removal and bilateral axillary to superficial femoral artery bypass Treated for infected aortobifemoral graft, s/p bilateral limb removal in 09/2017 with possible source from seeding of graft during admission in 03/2017 for Staph hominis bacteremia. Declined removal of the main body of the ABF graft and treated with empiric vanc/ceftaz per ID. Patient declined PICC, so he was transitioned to doxycycline and cefuroxime per ID recs. He remains on chronic suppressive therapy with cefuroxime. Tagged WBC scan in 11/2017 was negative. Patient admitted for pneumonia - discussed antibiotic regimen with pharmacy, they recommend ceftriaxone. - Continue IV azithromycin and ceftriaxone  Dispo: Anticipated discharge pending clinical improvement.   Colbert Ewing, MD 01/09/2018, 6:39 AM Pager: Mamie Nick [phone]

## 2018-01-09 NOTE — Progress Notes (Signed)
Rt took pt off BIPAP and  placed pt on [place] on 3L. Pt sats are 100% and tolerating well. RT to cont to monitor.

## 2018-01-09 NOTE — Evaluation (Signed)
Physical Therapy Evaluation Patient Details Name: [name] MRN: [id number] DOB: Feb 17, 1946 Today's Date: 01/09/2018   History of Present Illness  Patient seen at bedside per request of day team. Briefly, he has a history of CAD, HFrEF (EF 40-45%), COPD, PAD, Atrial Fibrillation on Xarelto, and recently infected vascular bypass graft who is admitted for acute respiratory distress secondary to suspected CAP and treated with antibiotics and BiPAP with improvement. He was noted to be anemic (Hgb 6.8; baseline ~8) and transfused PRBCs and shortly afterward developed increased work of breathing requiring placement back on BiPAP, discontinuation of blood transfusion, and he was given IV lasix for suspected pulmonary edema.    Clinical Impression  Pt admitted with above diagnosis. Pt currently with functional limitations due to the deficits listed below (see PT Problem List). PTA, pt living with son in trailer with limited community ambulation without AD. Upon eval, patient moving with supervision overall, ambulating 100' in hall with rest breaks. Patient desats quickly on RA (see vitals below). Pts daughter present and very involved. Focused on advising patient with energy conservation and proactive rest breaks, as he is not interested in wearing home O2, or receiving any follow up physical therapy to improve activity tolerance. PT will sign off at this time.    Vitals:   Seated at rest: BP 86/56  SpO2 95% RA  HR 84  Walking 70' feet: BP 110/91 SpO2 76%-80%, with 90 seconds of rest/breahting technique returns to 91%       Follow Up Recommendations No PT follow up    Equipment Recommendations  None recommended by PT    Recommendations for Other Services       Precautions / Restrictions Precautions Precautions: Fall Restrictions Weight Bearing Restrictions: No      Mobility  Bed Mobility               General bed mobility comments: OOB at entry  Transfers Overall  transfer level: Needs assistance Equipment used: None Transfers: Sit to/from Stand Sit to Stand: Supervision            Ambulation/Gait Ambulation/Gait assistance: Supervision;Min guard Ambulation Distance (Feet): 100 Feet Assistive device: None Gait Pattern/deviations: Step-through pattern;Step-to pattern Gait velocity: decreased   General Gait Details: intermittent step through gait, pt desats quickly on RA, requires seated rest breaks after 30-40' of walking. no AD, no LOB.   Stairs            Wheelchair Mobility    Modified Rankin (Stroke Patients Only)       Balance Overall balance assessment: Mild deficits observed, not formally tested                                           Pertinent Vitals/Pain Pain Assessment: Faces Faces Pain Scale: Hurts even more Pain Location: headache from cervical  Pain Intervention(s): Limited activity within patient's tolerance    Home Living Family/patient expects to be discharged to:: Private residence Living Arrangements: Children Available Help at Discharge: Family;Available 24 hours/day Type of Home: Mobile home Home Access: Stairs to enter   Entrance Stairs-Number of Steps: 1 Home Layout: One level Home Equipment: Shower seat      Prior Function Level of Independence: Independent               Hand Dominance   Dominant Hand: Right    Extremity/Trunk Assessment  Upper Extremity Assessment Upper Extremity Assessment: Overall WFL for tasks assessed    Lower Extremity Assessment Lower Extremity Assessment: (BLE strength 4-/5)    Cervical / Trunk Assessment Cervical / Trunk Assessment: Kyphotic  Communication   Communication: Other (comment)  Cognition Arousal/Alertness: Awake/alert Behavior During Therapy: WFL for tasks assessed/performed Overall Cognitive Status: Within Functional Limits for tasks assessed                                        General  Comments      Exercises     Assessment/Plan    PT Assessment Patent does not need any further PT services  PT Problem List         PT Treatment Interventions      PT Goals (Current goals can be found in the Care Plan section)  Acute Rehab PT Goals Patient Stated Goal: return home, not interested in future HHPT or OP PT PT Goal Formulation: With patient Time For Goal Achievement: 01/16/18 Potential to Achieve Goals: Good    Frequency     Barriers to discharge        Co-evaluation               AM-PAC PT "6 Clicks" Daily Activity  Outcome Measure Difficulty turning over in bed (including adjusting bedclothes, sheets and blankets)?: A Little Difficulty moving from lying on back to sitting on the side of the bed? : A Little Difficulty sitting down on and standing up from a chair with arms (e.g., wheelchair, bedside commode, etc,.)?: A Little Help needed moving to and from a bed to chair (including a wheelchair)?: A Little Help needed walking in hospital room?: A Little Help needed climbing 3-5 steps with a railing? : A Little 6 Click Score: 18    End of Session Equipment Utilized During Treatment: Gait belt Activity Tolerance: Patient tolerated treatment well Patient left: in chair;with call bell/phone within reach;with family/visitor present Nurse Communication: Mobility status PT Visit Diagnosis: Unsteadiness on feet (R26.81)    Time: 1700-1727 PT Time Calculation (min) (ACUTE ONLY): 27 min   Charges:   PT Evaluation $PT Eval Low Complexity: 1 Low PT Treatments $Gait Training: 8-22 mins   PT G Codes:       Reinaldo Berber, PT, DPT Acute Rehab Services Pager: [phone]    Reinaldo Berber 01/09/2018, 5:45 PM

## 2018-01-09 NOTE — Progress Notes (Signed)
Internal Medicine Attending  Date: 01/09/2018  Patient name: [name] Medical record number: [id number] Date of birth: [birth year] Age: 72 y.o. Gender: male  I saw and evaluated the patient. I reviewed the resident's note by Dr. Ronalee Red and I agree with the resident's findings and plans as documented in her progress note.  Please see my H&P dated 01/09/2018 for the specifics of my evaluation, assessment, plan from earlier in the day.

## 2018-01-09 NOTE — H&P (Signed)
Internal Medicine Attending Admission Note Date: 01/09/2018  Patient name: [name] Medical record number: [id number] Date of birth: Aug 10, 1946 Age: 72 y.o. Gender: male  I saw and evaluated the patient. I reviewed the resident's note and I agree with the resident's findings and plan as documented in the resident's note.  Chief Complaint(s): Shortness of breath 1 day.  History - key components related to admission:  [name] is a 72 year old man with a history of coronary artery disease complicated by systolic heart failure, chronic obstructive pulmonary disease, peripheral vascular occlusive disease, and infected vascular bypass graft who was in his usual state of health until the evening prior to admission when he developed the onset of dyspnea at rest. As the dyspnea progressed, and he had difficulty sleeping, he presented to the emergency department for further evaluation. He notes increased productive cough and a one-week history of increasing dyspnea on exertion. He also denies fevers, shakes, chills, chest pain, or lower extremity swelling. He notes compliance with his inhaler regimen. A chest x-ray obtained in the emergency department demonstrated a right lower lobe infiltrate and he was admitted to the internal medicine teaching service for further evaluation and care. He was also found to be mildly anemic and a blood transfusion and IV fluids were given in the emergency department. This resulted in a worsening in his dyspnea requiring BiPAP therapy. He was found to have circulatory overload related to the transfusion and the IV fluids and was placed on BiPAP therapy and diuresed with resolution of his worsening symptoms.  When seen on rounds the morning after admission he was feeling much improved from a respiratory standpoint. He was comfortable on nasal cannula and had not required BiPAP therapy. He was without other new complaints this morning.  Physical Exam - key components  related to admission:  Vitals:   01/09/18 1137 01/09/18 1154 01/09/18 1205 01/09/18 1551  BP: 106/62 (!) 76/45 (!) 83/54 (!) 83/50  Pulse: 83 82  84  Resp: 18 16  20   Temp:  98.9 F (37.2 C)  98.5 F (36.9 C)  TempSrc:  Oral  Oral  SpO2: 97%   95%  Weight:      Height:       Gen.: Well-developed, chronically ill-appearing, disheveled man lying comfortably in bed in no acute distress. Lungs: Right posterior bronchial breath sounds with inspiratory crackles and without wheezes. Abdomen: Soft, nontender, without guarding or rebound. Extremities: Without edema.  Lab results:  Basic Metabolic Panel: Recent Labs    01/08/18 1344 01/09/18 0421  NA 139 140  K 5.0 3.8  CL 107 104  CO2 20* 25  GLUCOSE 104* 90  BUN 26* 35*  CREATININE 1.30* 1.04  CALCIUM 8.8* 8.7*   Liver Function Tests: Recent Labs    01/08/18 1344  AST 19  ALT 10*  ALKPHOS 77  BILITOT 0.7  PROT 7.5  ALBUMIN 3.5   CBC: Recent Labs    01/08/18 1344  01/09/18 0421 01/09/18 1459  WBC 15.0*  --  7.6 8.9  NEUTROABS 12.6*  --   --   --   HGB 6.8*   < > 6.1* 7.2*  HCT 25.1*   < > 21.7* 24.6*  MCV 78.0  --  76.1* 79.4  PLT 464*  --  278 265   < > = values in this interval not displayed.   D-Dimer: Recent Labs    01/08/18 1344  DDIMER 2.45*   CBG: Recent Labs    01/09/18  [place] 85   Thyroid Function Tests: Recent Labs    01/09/18 0820  TSH 0.763   Urinalysis:  Clear straw, specific gravity 1.010, pH 5.0, protein negative, nitrite negative, leukocytes negative.  Misc. Labs:  ABG an unknown FiO2 7.38/34/161 FOBT pending  Imaging results:  Dg Chest Port 1 View  Result Date: 01/08/2018 CLINICAL DATA:  Respiratory distress EXAM: PORTABLE CHEST 1 VIEW COMPARISON:  01/08/2018 FINDINGS: Normal cardiac silhouette. Mild increase in RIGHT basilar atelectasis. Interstitial edema pattern and mild pulmonary edema in the lower lobes. No significant change. No pneumothorax. IMPRESSION: 1.  Interstitial and mild pulmonary edema pattern similar to comparison exam. 2. Mild increase in RIGHT basilar atelectasis. Electronically Signed   By: Suzy Bouchard M.D.   On: 01/08/2018 18:39   Dg Chest Port 1 View  Result Date: 01/08/2018 CLINICAL DATA:  Acute onset shortness of breath last night. EXAM: PORTABLE CHEST 1 VIEW COMPARISON:  Chest x-ray dated September 08, 2017. FINDINGS: The heart size and mediastinal contours are within normal limits. Diffusely increased interstitial markings are slightly more prominent. The lungs remain hyperinflated with emphysematous changes in the upper lobes. New peripheral opacity in the right mid to lower lung. No pneumothorax or large pleural effusion. No acute osseous abnormality. IMPRESSION: 1. Slightly worsened interstitial pulmonary edema with new peripheral opacity in the right mid to lower lung, which could reflect superimposed pneumonia. 2. COPD. Electronically Signed   By: Titus Dubin M.D.   On: 01/08/2018 14:06   AP portable chest x-ray 2: Personally reviewed. The initial AP portable chest x-ray demonstrated a right lower lobe superior subsegmental infiltrate and the subsequent AP portable chest x-ray revealed involvement in the right lower lobe basilar subsegments as well. This right lower lobe pneumonia is new compared to the most recent previous chest x-ray from 09/08/2017.  Other results:  EKG: Personally reviewed. Normal sinus tachycardia at 105 bpm, normal axis, first-degree AV block, nonspecific intraventricular conduction delay, significant precordial V waves, LVH by voltage, ST elevation in V3 and V4 and ST depression in V5 and V6. The ST depression and elevation are deeper than baseline from 05/05/2017.  Assessment & Plan by Problem:  [name] is a 72 year old man with a history of coronary artery disease complicated by systolic heart failure, chronic obstructive pulmonary disease, peripheral vascular occlusive disease, and infected  vascular bypass graft who was in his usual state of health until the evening prior to admission when he developed the onset of dyspnea at rest. He was found to have a right lower lobe community-acquired pneumonia and his course was complicated by circulatory overload requiring BiPAP therapy transiently. Overnight, he has symptomatically improved with the antibiotics and bronchodilators.  1) Right lower lobe community-acquired pneumonia: We are continuing the ceftriaxone and azithromycin for the community-acquired pneumonia. We are also continuing his bronchodilators and oxygen therapy as indicated.  2) Elevated troponin: Likely related to demand ischemia. We will trend serial troponins. We will also treat the underlying pulmonary disease that is likely the cause.  3) Anemia: Unfortunately, the ER transfused before initiating an anemia workup. Thus, many of the studies will now be adversely impacted upon and uninterpretable. We will obtain a fecal occult blood test to look for bleeding in the GI tract. A urinalysis was without blood.  4) Acute on chronic systolic heart failure: He has been diuresed and is felt to be euvolemic at this point. We will be careful with his volume status.  5) Disposition: I anticipate he will  require at least 2-3 days of therapy for his pneumonia before he feels well enough to return home. Now that he has initially improved, we will start physical therapy in the morning in preparation of his return to home should this be his desire.

## 2018-01-09 NOTE — Discharge Summary (Signed)
Name: [name] MRN: [id number] DOB: [birth year] 72 y.o. PCP: Sheral Apley  Date of Admission: 01/08/2018  1:26 PM Date of Discharge: 01/10/2018 Attending Physician: Oval Linsey, MD  Discharge Diagnosis: 1. CAP 2. Acute on chronic anemia 3. Transfusion associated circulatory overload, HFrEF 4. Afib 5. 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  Active Problems:   Pneumonia   Community acquired pneumonia of right lower lobe of lung ([place])   Acute on chronic combined systolic and diastolic heart failure [facility])   Discharge Medications: Allergies as of 01/10/2018      Reactions   Ibuprofen Hives   Aspirin Nausea Only      Medication List    STOP taking these medications   isosorbide mononitrate 30 MG 24 hr tablet Commonly known as:  IMDUR   oxyCODONE 5 MG immediate release tablet Commonly known as:  Oxy IR/ROXICODONE   pantoprazole 40 MG tablet Commonly known as:  PROTONIX   rivaroxaban 20 MG Tabs tablet Commonly known as:  XARELTO     TAKE these medications   albuterol (2.5 MG/3ML) 0.083% nebulizer solution Commonly known as:  PROVENTIL Take 2.5 mg by nebulization every 6 (six) hours as needed for wheezing or shortness of breath.   amiodarone 200 MG tablet Commonly known as:  PACERONE TAKE 1 TABLET BY MOUTH ONCE DAILY   aspirin EC 81 MG tablet Take 81 mg by mouth daily.   atorvastatin 80 MG tablet Commonly known as:  LIPITOR Take 1 tablet (80 mg total) by mouth daily at 6 PM.   azithromycin 250 MG tablet Commonly known as:  ZITHROMAX Take 1 tablet (250 mg total) by mouth daily.   BREO ELLIPTA 100-25 MCG/INH Aepb Generic drug:  fluticasone furoate-vilanterol Inhale 1 puff into the lungs as needed.   budesonide-formoterol 160-4.5 MCG/ACT inhaler Commonly known as:  SYMBICORT Inhale 2 puffs into the lungs 2 (two) times daily. What changed:    when to take this  reasons to take this   cefUROXime 500 MG tablet Commonly  known as:  CEFTIN Take 500 mg by mouth 2 (two) times daily with a meal.   folic acid 1 MG tablet Commonly known as:  FOLVITE Place 1 tablet (1 mg total) into feeding tube daily. What changed:    how much to take  how to take this   [facility] packet Generic drug:  polyethylene glycol Take 17 g by mouth daily.   mirtazapine 45 MG tablet Commonly known as:  REMERON Take 45 mg by mouth at bedtime.   nitroGLYCERIN 0.4 MG SL tablet Commonly known as:  NITROSTAT Place 0.4 mg under the tongue every 5 (five) minutes as needed for chest pain.   thiamine 100 MG tablet Take 1 tablet (100 mg total) by mouth daily. What changed:  when to take this   tiZANidine 4 MG tablet Commonly known as:  ZANAFLEX Take 4 mg by mouth every 8 (eight) hours as needed. At bedtime       Disposition and follow-up:   [name] was discharged from [facility] in Stable condition.  At the hospital follow up visit please address:  1.  CAP - Please assess respiratory symptoms. Discharged on azithromycin 250 mg daily to complete a 5 day course in addition to his home cefuroxime 500 mg BID.  2.  TACO, HFrEF - Improved with IV lasix. Please assess volume status.  3.  Acute on chronic anemia - Requiring 1 unit pRBC transfusion. Hemoglobin  7.1 on discharge, up from 6.1. Asymptomatic. Unclear etiology, has never been worked up for his chronic anemia. No active bleeding. Instructed to hold xarelto until PCP follow up.  4.  Labs / imaging needed at time of follow-up: CBC  5.  Pending labs/ test needing follow-up: None  Follow-up Appointments:   Hospital Course by problem list: Active Problems:   Pneumonia   Community acquired pneumonia of right lower lobe of lung ([place])   Acute on chronic combined systolic and diastolic heart failure ([place])   1. CAP Patient admitted for progressively worsening shortness. No documented hypoxia per EMS or ED, but did initially required BiPAP on  presentation due to increased work of breathing. CXR demonstrated infiltrate in right mid-lower lung consistent with CAP. He was started on PO azithromycin and IV ceftriaxone, home cefuroxime was held. Troponin was slightly elevated, thought to be related to demand ischemia. His breathing status improved initially and he was able to wean to 4L [place], however several hours after presentation, he experienced what is thought to be acute respiratory distress due to volume overload while receiving a blood transfusion requiring BiPAP. He improved with diuresis. On discharge, he was saturating 93% on RA and reported improvement in his symptoms. Sent home on azithromycin 250 mg daily to complete a 5 day course, instructed to resume his home cefuroxime for his infected bypass graft and PNA coverage.  2. Acute on chronic anemia Hb on admission was 6.8 (baseline ~8). 2 units of pRBC transfusion were ordered while in the ED. About 20 minutes into his first transfusion, he experienced acute respiratory distress, and the transfusion was immediately stopped. Repeat CXR showed mild worsening concerning for pulm edema. He was treated with IV lasix and significantly improved. Unfortunately his hemoglobin dropped to 6.1 the following morning. He remained asymptomatic. He was given 1 unit pRBC that was transfused slowly over 4 hours and pre treated with IV lasix. He tolerated this well without respiratory distress. Post transfusion hemoglobin was 7.2, and stable at 7.3 the following morning. Unclear etiology for his anemia. UA was without hematuria. It is possible he has developed an aortoenteric fistula with his significant bypass history, however we were unable to obtain FOBT due to no BM while admitted and he denied melena / hematochezia. Instructed patient to hold xarelto until PCP follow up for repeat CBC and further work up.   3. Transfusion associated circulatory overload, HFrEF Patient initially presented without concern for  volume overload, however after receiving IVF and IV antibiotics in the ED, about 20 minutes into his first blood transfusion, he began experiencing increased work of breathing, requiring BiPAP. Transfusion was stopped. Stat CXR was obtained, which suggested volume overload, and thus he was treated with lasix for TACO. His symptoms improved significantly the following morning and he was able to be transitioned to nasal cannula. He was pre-treated with IV lasix prior to his next blood transfusion which he tolerated well.   4. Afib CHADS2-VASc at least 4, suggesting 6.7% annual risk of stroke. He was continued on home amiodarone. TSH was normal. Home xarelto was held due to anemia. Instructed to continue holding until PCP follow up for his anemia.   5. Chronic respiratory failure 2/2 COPD He uses intermittent oxygen at home as needed. He presented with respiratory distress despite home oxygen. He was initially treated with solumedrol, Mg, and Atrovent per EMS, however presentation was felt to be more consistent with PNA rather than COPD exacerbation. Steroids were thus discontinued, but he  was continued on scheduled breathing treatments. Discharged on home inhaler regimen.   6. 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 He also required I&D for bilateral groin swelling and purulence. He was re-admitted in January 2019 and treated for infected aortobifemoral graft, s/p bilateral limb removal. Unclear source, but he was admitted in 03/2017 for Staph hominis bacteremia, which could have led to seeding of his graft. He also underwent bilateral axillary to superficial femoral artery bypass. He declined removal of the main body of the ABF graft. Operative Gram stain  showedno organisms.Right groin cultures were negative.Diphtheroids grew from his left groin cultures. He received empiric IV vancomycin and ceftazidine during his hospitalization per ID recs and did not want a PICC line for IV antibiotics. He was thus discharged on empiric doxycycline and cefuroxime per ID recs. He remains on chronic suppressive therapy. Tagged WBC scan in 11/2017 was negative. He was discharged on azithromycin for his PNA and instructed to resume his home cefuroxime.   Discharge Vitals:   BP (!) 89/53   Pulse 77   Temp 98.2 F (36.8 C) (Oral)   Resp 16   Ht 5\' 6"  (1.676 m)   Wt 115 lb 11.9 oz (52.5 kg)   SpO2 93%   BMI 18.68 kg/m   Pertinent Labs, Studies, and Procedures:  CBC Latest Ref Rng & Units 01/10/2018 01/09/2018 01/09/2018  WBC 4.0 - 10.5 K/uL 7.0 8.9 7.6  Hemoglobin 13.0 - 17.0 g/dL 7.3(L) 7.2(L) 6.1(LL)  Hematocrit 39.0 - 52.0 % 24.7(L) 24.6(L) 21.7(L)  Platelets 150 - 400 K/uL 254 265 278   CMP Latest Ref Rng & Units 01/10/2018 01/09/2018 01/08/2018  Glucose 65 - 99 mg/dL 92 90 104(H)  BUN 6 - 20 mg/dL 48(H) 35(H) 26(H)  Creatinine 0.61 - 1.24 mg/dL 1.01 1.04 1.30(H)  Sodium 135 - 145 mmol/L 136 140 139  Potassium 3.5 - 5.1 mmol/L 3.4(L) 3.8 5.0  Chloride 101 - 111 mmol/L 103 104 107  CO2 22 - 32 mmol/L 25 25 20(L)  Calcium 8.9 - 10.3 mg/dL 8.3(L) 8.7(L) 8.8(L)  Total Protein 6.5 - 8.1 g/dL - - 7.5  Total Bilirubin 0.3 - 1.2 mg/dL - - 0.7  Alkaline Phos 38 - 126 U/L - - 77  AST 15 - 41 U/L - - 19  ALT 17 - 63 U/L - - 10(L)   UA negative TSH 0.763 Troponin 0.18 ABG 7.382/34/161/20.3 on BiPAP 40% FiO2  CXR 01/08/2018 (on admission) 1. Slightly worsened interstitial pulmonary edema with new peripheral opacity in the right mid to lower lung, which could reflect superimposed pneumonia. 2. COPD.  CXR 01/08/2018 1. Interstitial and mild pulmonary edema pattern similar to comparison exam. 2. Mild increase in RIGHT basilar atelectasis.  Discharge  Instructions: Discharge Instructions    Call MD for:  difficulty breathing, headache or visual disturbances   Complete by:  As directed    Call MD for:  persistant dizziness or light-headedness   Complete by:  As directed    Call MD for:  temperature >100.4   Complete by:  As directed    Diet - low sodium heart healthy   Complete by:  As directed    Discharge instructions   Complete by:  As directed    [name],  It was a pleasure taking care  of you. I am glad your breathing is doing better. For your pneumonia, please continue taking your twice daily cefuroxime as previously prescribed for your infected bypass graft. I have sent a prescription for a new, additional antibiotic called azithromycin. Please take this daily for the next two days to complete your treatment course.   For your low blood counts, you required 1 unit of blood here in the hospital. I have held your blood thinner, xarelto, for now. Please do not take this until you follow up with your primary care doctor for further work up and repeat blood work. If you develope lightheadedness, dizziness, chest pain, or notice any bleeding, please call your doctor or present to the ER for evaluation.   Please call your primary care doctor for a follow up appointment in the next 1-2 weeks. Thank you!  - Dr. Philipp Ovens   Increase activity slowly   Complete by:  As directed       Signed: Velna Ochs, MD 01/10/2018, 10:41 AM   Pager: Mamie Nick [phone]

## 2018-01-10 ENCOUNTER — Encounter (HOSPITAL_COMMUNITY): Payer: Self-pay | Admitting: *Deleted

## 2018-01-10 ENCOUNTER — Other Ambulatory Visit: Payer: Self-pay

## 2018-01-10 LAB — CBC
HCT: 24.7 % — ABNORMAL LOW (ref 39.0–52.0)
HEMOGLOBIN: 7.3 g/dL — AB (ref 13.0–17.0)
MCH: 23 pg — ABNORMAL LOW (ref 26.0–34.0)
MCHC: 29.6 g/dL — ABNORMAL LOW (ref 30.0–36.0)
MCV: 77.9 fL — ABNORMAL LOW (ref 78.0–100.0)
PLATELETS: 254 10*3/uL (ref 150–400)
RBC: 3.17 MIL/uL — AB (ref 4.22–5.81)
RDW: 19.1 % — ABNORMAL HIGH (ref 11.5–15.5)
WBC: 7 10*3/uL (ref 4.0–10.5)

## 2018-01-10 LAB — TYPE AND SCREEN
ABO/RH(D): O POS
ANTIBODY SCREEN: NEGATIVE
Unit division: 0
Unit division: 0

## 2018-01-10 LAB — BASIC METABOLIC PANEL
Anion gap: 8 (ref 5–15)
BUN: 48 mg/dL — AB (ref 6–20)
CO2: 25 mmol/L (ref 22–32)
CREATININE: 1.01 mg/dL (ref 0.61–1.24)
Calcium: 8.3 mg/dL — ABNORMAL LOW (ref 8.9–10.3)
Chloride: 103 mmol/L (ref 101–111)
GFR calc non Af Amer: >60 mL/min (ref >60)
GLUCOSE: 92 mg/dL (ref 65–99)
Potassium: 3.4 mmol/L — ABNORMAL LOW (ref 3.5–5.1)
SODIUM: 136 mmol/L (ref 135–145)

## 2018-01-10 LAB — BPAM RBC
BLOOD PRODUCT EXPIRATION DATE: 201905092359
Blood Product Expiration Date: 201905092359
ISSUE DATE / TIME: 201905031656
ISSUE DATE / TIME: 201905040857
Unit Type and Rh: 5100
Unit Type and Rh: 5100

## 2018-01-10 LAB — TROPONIN I: Troponin I: 0.49 ng/mL (ref <0.03)

## 2018-01-10 MED ORDER — NICOTINE 14 MG/24HR TD PT24
14.0000 mg | MEDICATED_PATCH | Freq: Every day | TRANSDERMAL | Status: DC
  Administered 2018-01-10: 14 mg via TRANSDERMAL
  Filled 2018-01-10 (×2): qty 1

## 2018-01-10 MED ORDER — AZITHROMYCIN 250 MG PO TABS: 250.0000 mg | ORAL_TABLET | Freq: Every day | ORAL | 0 refills | Status: DC

## 2018-01-10 MED ORDER — POTASSIUM CHLORIDE CRYS ER 20 MEQ PO TBCR
40.0000 meq | EXTENDED_RELEASE_TABLET | Freq: Once | ORAL | Status: AC
  Administered 2018-01-10: 40 meq via ORAL
  Filled 2018-01-10: qty 2

## 2018-01-10 NOTE — Progress Notes (Signed)
Subjective: When seen on rounds this morning, patient was diaphoretic, tremulous, and very irritable. Believed he was still withdrawing from his opioids, which he last took 10 days ago. Reports his daughter has been treating him with CBD oil drops, and feels that withdrawal from CBD may also be contributing. He last drank alcohol over 10 years ago and denies any other drug use. Spoke with daughter who confirmed this history. Reports he had already completed his opioid withdrawal at home and had been doing well since then. Denies any knowledge of other drug use. He is alert and oriented this morning and hemodynamically stable.   Objective:  Vital signs in last 24 hours: Vitals:   01/09/18 2300 01/10/18 0400 01/10/18 0759 01/10/18 0801  BP: (!) 90/57 (!) 108/53    Pulse: 79 83    Resp: 17 15    Temp: 98.1 F (36.7 C) 98.2 F (36.8 C)    TempSrc: Oral Oral    SpO2: 90% 92% 91% 92%  Weight:      Height:       GEN: Thin male, diaphoretic, tremulous, A&O x 3  CV: irregularly irregular rhythm, no m/r/g PULM: Bronchial breath sounds in right mid-lung field, occasional wheezing in upper lobes, no crackles. Breathing comfortably on RA, sats 93% SKIN: Multiple well-healed surgical scars over right clavicle, midline abdomen, and bilateral groins MSK: No LE edema, SCDs in place  Assessment/Plan:  Mr. Cuthrell is a 72yo male with PMH of afib, CAD, HFrEF (TTE 03/2017: EF 40-45%), COPD, PAD, SVT, tobacco use, and recent infection of vascular bypass graft who presents with 1 day of shortness of breath, currently treating for CAP with azithromycin and ceftriaxone. He required BiPAP on presentation. He did experience respiratory distress 5/3 afternoon after getting IVF with antibiotics and blood transfusion, which was consistent with TACO. He received IV lasix with significant improvement in his symptoms, currently doing well on 2L [place].  CAP Remains afebrile & off BiPAP. Vitals stable. Leukocytosis  resolved. Currently on day 3 of IV ceftriaxone and po azithromycin, and satting well on room air. He is on intermittent oxygen at home as needed. - Day 3 of IV ceftriaxone and PO azithromycin. Will transition back to home cefuroxime 500 mg BID with addition of PO azithromycin 250 mg QD for a 5 day course - Discharge  Acute on chronic anemia Hb on admission 6.8->6.1 this AM (baseline ~8). Unclear reason for chronic anemia, no prior anemia work up in our system. Patient experienced TACO after 20 minutes of his first unit of pRBC transfusion on the day of admission, so transfusion was discontinued. 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 UA is without hematuria, FOBT has not been done. He received 1 unit of pRBCs yesterday pre treated with IV lasix 20 mg and tolerated well. H&H stable this morning. - FOBT --> note done, no BM yesterday or today - Follow up PCP for further work up   COPD Mild wheezing in upper lobes on exam. Received one dose of solu-medrol en route by EMS. No further steroids since admission. Clinical presentation is more consistent with CAP than exacerbation.  - Continue home Breo - D/c'ed home Dulera - Continue scheduled duonebs 58mL TID - Continue home albuterol nebulizer 2.5mg  q4h PRN  Afib Irregularly irregular rhythm on exam, rate controlled on home amio. TSH normal. No PFTs on record to evaluate for amiodarone-induced lung injury. - Cardiac monitoring - Continue home amiodarone  200mg  daily - Holding home xarelto 2/2 anemia  HFrEF (TTE 03/2017: EF 40-45%) TACO Initially came in without concern for volume overload, however he was on maintenance fluids, IV Abx, and blood transfusion, which likely precipitated TACO and respiratory distress. He was treated with BiPAP and lasix with significant improvement in his symptoms.  - Continue home atorvastatin - Daily weights - I/Os  3 vessel obstructive  CAD, s/p multiple stents (most recent in 2007) On LHC 03/2017, recommended medical management. Denies chest pain.  - Holding home aspirin 81mg  in setting of anemia - Continue home atorvastatin 80mg  daily  Hx of HTN Hypotension Soft BP since admission in the setting of CAP and IV diuresis. Normotensive today. - Holding home imdur 15mg  daily, will resume on discharge - Encouraged PO intake   AAA, s/p open aortobifemoral [id number]/2162, Complicated bypersistentinfection of vascular bypass graft, Now s/p bilateralaortobifemoral limb removaland bilateral axillary to superficial femoral artery bypass Treated for infected aortobifemoral graft, s/pbilaterallimb removal in 09/2017 with possible source from seeding of graft during admission in 03/2017 for Staph hominis bacteremia. Declinedremoval of the main body of the ABF graft and treated with empiric vanc/ceftaz per ID. Patient declined PICC, so he was transitioned to doxycycline and cefuroxime per ID recs.He remains on chronic suppressive therapy with cefuroxime. Tagged WBC scan in 11/2017 was negative.Patient admitted for pneumonia and transitioned to IV ceftriaxone. - Resume home cefuroxime 500 mg BID on discharge  Dispo: Anticipated discharge today.   Velna Ochs, MD 01/10/2018, 9:09 AM Pager: [phone]

## 2018-01-10 NOTE — Progress Notes (Signed)
Patient given discharge instructions. Patient/daughter educated on medication/follow up appts and monitor for signs of bleeding. Patient/daughter verbalized understanding. Patient discharged home with daughter.

## 2018-01-18 ENCOUNTER — Inpatient Hospital Stay (HOSPITAL_COMMUNITY)
Admission: EM | Admit: 2018-01-18 | Discharge: 2018-01-21 | DRG: 291 | Disposition: A | Discharge status: Home / Self Care | Payer: Medicare Other | Attending: Student in an Organized Health Care Education/Training Program | Admitting: Student in an Organized Health Care Education/Training Program

## 2018-01-18 ENCOUNTER — Emergency Department (HOSPITAL_COMMUNITY): Payer: Medicare Other

## 2018-01-18 DIAGNOSIS — E785 Hyperlipidemia, unspecified: Secondary | ICD-10-CM | POA: Diagnosis present

## 2018-01-18 DIAGNOSIS — D509 Iron deficiency anemia, unspecified: Secondary | ICD-10-CM | POA: Diagnosis present

## 2018-01-18 DIAGNOSIS — I480 Paroxysmal atrial fibrillation: Secondary | ICD-10-CM | POA: Diagnosis present

## 2018-01-18 DIAGNOSIS — D649 Anemia, unspecified: Secondary | ICD-10-CM

## 2018-01-18 DIAGNOSIS — R0603 Acute respiratory distress: Secondary | ICD-10-CM

## 2018-01-18 DIAGNOSIS — F17210 Nicotine dependence, cigarettes, uncomplicated: Secondary | ICD-10-CM | POA: Diagnosis present

## 2018-01-18 DIAGNOSIS — Z955 Presence of coronary angioplasty implant and graft: Secondary | ICD-10-CM

## 2018-01-18 DIAGNOSIS — I714 Abdominal aortic aneurysm, without rupture: Secondary | ICD-10-CM | POA: Diagnosis not present

## 2018-01-18 DIAGNOSIS — I779 Disorder of arteries and arterioles, unspecified: Secondary | ICD-10-CM | POA: Diagnosis not present

## 2018-01-18 DIAGNOSIS — Z7951 Long term (current) use of inhaled steroids: Secondary | ICD-10-CM | POA: Diagnosis not present

## 2018-01-18 DIAGNOSIS — R0602 Shortness of breath: Secondary | ICD-10-CM | POA: Diagnosis not present

## 2018-01-18 DIAGNOSIS — Z79899 Other long term (current) drug therapy: Secondary | ICD-10-CM | POA: Diagnosis not present

## 2018-01-18 DIAGNOSIS — I259 Chronic ischemic heart disease, unspecified: Secondary | ICD-10-CM | POA: Diagnosis not present

## 2018-01-18 DIAGNOSIS — I252 Old myocardial infarction: Secondary | ICD-10-CM | POA: Diagnosis not present

## 2018-01-18 DIAGNOSIS — Z8673 Personal history of transient ischemic attack (TIA), and cerebral infarction without residual deficits: Secondary | ICD-10-CM

## 2018-01-18 DIAGNOSIS — I255 Ischemic cardiomyopathy: Secondary | ICD-10-CM | POA: Diagnosis present

## 2018-01-18 DIAGNOSIS — I739 Peripheral vascular disease, unspecified: Secondary | ICD-10-CM | POA: Diagnosis present

## 2018-01-18 DIAGNOSIS — T827XXA Infection and inflammatory reaction due to other cardiac and vascular devices, implants and grafts, initial encounter: Secondary | ICD-10-CM | POA: Diagnosis not present

## 2018-01-18 DIAGNOSIS — I509 Heart failure, unspecified: Secondary | ICD-10-CM | POA: Diagnosis not present

## 2018-01-18 DIAGNOSIS — J9621 Acute and chronic respiratory failure with hypoxia: Secondary | ICD-10-CM | POA: Diagnosis present

## 2018-01-18 DIAGNOSIS — I110 Hypertensive heart disease with heart failure: Secondary | ICD-10-CM | POA: Diagnosis not present

## 2018-01-18 DIAGNOSIS — I340 Nonrheumatic mitral (valve) insufficiency: Secondary | ICD-10-CM | POA: Diagnosis not present

## 2018-01-18 DIAGNOSIS — Z9889 Other specified postprocedural states: Secondary | ICD-10-CM | POA: Diagnosis not present

## 2018-01-18 DIAGNOSIS — R069 Unspecified abnormalities of breathing: Secondary | ICD-10-CM | POA: Diagnosis not present

## 2018-01-18 DIAGNOSIS — D638 Anemia in other chronic diseases classified elsewhere: Secondary | ICD-10-CM | POA: Diagnosis present

## 2018-01-18 DIAGNOSIS — Z792 Long term (current) use of antibiotics: Secondary | ICD-10-CM | POA: Diagnosis not present

## 2018-01-18 DIAGNOSIS — I5043 Acute on chronic combined systolic (congestive) and diastolic (congestive) heart failure: Secondary | ICD-10-CM | POA: Diagnosis present

## 2018-01-18 DIAGNOSIS — Z9981 Dependence on supplemental oxygen: Secondary | ICD-10-CM

## 2018-01-18 DIAGNOSIS — Z720 Tobacco use: Secondary | ICD-10-CM | POA: Diagnosis not present

## 2018-01-18 DIAGNOSIS — R0989 Other specified symptoms and signs involving the circulatory and respiratory systems: Secondary | ICD-10-CM | POA: Diagnosis not present

## 2018-01-18 DIAGNOSIS — Z7982 Long term (current) use of aspirin: Secondary | ICD-10-CM

## 2018-01-18 DIAGNOSIS — I2510 Atherosclerotic heart disease of native coronary artery without angina pectoris: Secondary | ICD-10-CM | POA: Diagnosis present

## 2018-01-18 DIAGNOSIS — J449 Chronic obstructive pulmonary disease, unspecified: Secondary | ICD-10-CM | POA: Diagnosis present

## 2018-01-18 DIAGNOSIS — I4891 Unspecified atrial fibrillation: Secondary | ICD-10-CM | POA: Diagnosis not present

## 2018-01-18 DIAGNOSIS — I5023 Acute on chronic systolic (congestive) heart failure: Secondary | ICD-10-CM | POA: Diagnosis present

## 2018-01-18 DIAGNOSIS — T827XXD Infection and inflammatory reaction due to other cardiac and vascular devices, implants and grafts, subsequent encounter: Secondary | ICD-10-CM | POA: Diagnosis not present

## 2018-01-18 LAB — BASIC METABOLIC PANEL
Anion gap: 9 (ref 5–15)
BUN: 22 mg/dL — AB (ref 6–20)
CALCIUM: 8.6 mg/dL — AB (ref 8.9–10.3)
CO2: 22 mmol/L (ref 22–32)
CREATININE: 1.11 mg/dL (ref 0.61–1.24)
Chloride: 105 mmol/L (ref 101–111)
GFR calc Af Amer: >60 mL/min (ref >60)
GFR calc non Af Amer: >60 mL/min (ref >60)
Glucose, Bld: 360 mg/dL — ABNORMAL HIGH (ref 65–99)
Potassium: 4.6 mmol/L (ref 3.5–5.1)
SODIUM: 136 mmol/L (ref 135–145)

## 2018-01-18 LAB — I-STAT TROPONIN, ED: TROPONIN I, POC: 0 ng/mL (ref 0.00–0.08)

## 2018-01-18 LAB — CBC
HEMATOCRIT: 28.2 % — AB (ref 39.0–52.0)
Hemoglobin: 7.8 g/dL — ABNORMAL LOW (ref 13.0–17.0)
MCH: 22.8 pg — ABNORMAL LOW (ref 26.0–34.0)
MCHC: 27.7 g/dL — ABNORMAL LOW (ref 30.0–36.0)
MCV: 82.5 fL (ref 78.0–100.0)
PLATELETS: 396 10*3/uL (ref 150–400)
RBC: 3.42 MIL/uL — AB (ref 4.22–5.81)
RDW: 20.4 % — AB (ref 11.5–15.5)
WBC: 13.4 10*3/uL — ABNORMAL HIGH (ref 4.0–10.5)

## 2018-01-18 LAB — BRAIN NATRIURETIC PEPTIDE: B Natriuretic Peptide: 684.3 pg/mL — ABNORMAL HIGH (ref 0.0–100.0)

## 2018-01-18 LAB — PROTIME-INR
INR: 1.03
Prothrombin Time: 13.4 seconds (ref 11.4–15.2)

## 2018-01-18 LAB — MAGNESIUM: MAGNESIUM: 3.7 mg/dL — AB (ref 1.7–2.4)

## 2018-01-18 MED ORDER — FUROSEMIDE 10 MG/ML IJ SOLN
20.0000 mg | Freq: Once | INTRAMUSCULAR | Status: AC
  Administered 2018-01-18: 20 mg via INTRAVENOUS
  Filled 2018-01-18: qty 2

## 2018-01-18 NOTE — H&P (Signed)
Date: 01/18/2018               Patient Name:  [name] MRN: [id number]  DOB: 04/27/1946 Age / Sex: 72 y.o., male   PCP: Sheral Apley         Medical Service: Internal Medicine Teaching Service         Attending Physician: Dr. Melina Copa, Rebeca Alert, MD    First Contact: Dr. Ronalee Red Pager: [phone]  Second Contact: Dr. 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 Daughter present and assisted with portions of history.   He was recently admitted 5/3-5/5 for similar sx found to have pneumonia and was treated with azithromycin and IV Ceftriaxone (transitioned to home cefuroxime on discharge). This admission was also complicated by acute respiratory distress due to TACO which responded to IV Lasix. Following discharge, they report pt initially felt better with his breathing and sx. He has still had a cough but with less production than previously. He reports the acute onset of shortness of breath tonight which occurred while laying flat and has persisted. He notes some improvement with home O2 and now feels improved on BiPap. He denies LE edema or orthopnea prior to tonight (normally sleeps with one pillow), but does note using his home O2 more often than usual, especially at night. He has been eating TV dinners regularly. He reports finishing the prescribed azithromycin course appropriately. Daughter notes he was using symbicort as a rescue inhaler several times per day since discharge, had PCP fill albuterol inhaler recently. He has been off of Xarelto as instructed, no signs or sx of bleeding  reported. Daughter reports he has seemed to be feeling poor for about the last week.   In the ED, T not measured, HR 103, BP 170/81, 99% on BiPap. Per report, he was hypoxic with EMS to 70% on arrival which responded to BiPap and he received albuterol, magnesium, and solu-medrol en route. Labs notable for Hgb 7.8, WBC 13.4, troponin negative. He was admitted to IMTS for further management.    Meds:  No outpatient medications have been marked as taking for the 01/18/18 encounter [facility] Encounter).     Allergies: Allergies as of 01/18/2018 - Review Complete 01/18/2018  Allergen Reaction Noted  . Ibuprofen Hives 04/24/2011  . Aspirin Nausea Only 04/24/2011   Past Medical History:  Diagnosis Date  . AAA (abdominal aortic aneurysm) ([place])   . AAA (abdominal aortic aneurysm) without rupture ([place])   . Acute respiratory failure ([place]) 03/13/2017  . Anxiety   . Atrial fibrillation (HCC)     Brief episode of  . Atrial fibrillation with rapid ventricular response ([place]) 01/06/2016  . Cardiomyopathy, ischemic 10/27/2014  . CHF (congestive heart failure) ([place])   . COPD (chronic obstructive pulmonary disease) ([place])   . Coronary artery disease    Multivessel, S/p stenting to LCx. LHC 03/16/17: unchanged MVD- 50%mid LAD, 85% 1st daig, 100% prox circ, 100% prox RCA  . Depression   . Dyslipidemia   . Headache   . Hyperlipidemia   . Hypertension   .  Myocardial infarction ([place])   . Peripheral vascular disease ([place])   . 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 Shortness of breath dyspnea   . Stroke ([place])   . Tobacco dependence   . Ventricular dysfunction    Mild Left    Family History:  Family History  Problem Relation Age of Onset  . AAA (abdominal aortic aneurysm) Mother   . Cancer Father        bone marrow     Social History:  Social History   Tobacco Use  . Smoking status: Current Every Day Smoker    Packs/day: 0.50    Years: 20.00    Pack years: 10.00    Types: Cigarettes  .  Smokeless tobacco: Never Used  Substance Use Topics  . Alcohol use: No  . Drug use: No     Review of Systems: A complete ROS was negative except as per HPI.   Physical Exam: Blood pressure (!) 170/81, pulse 97, resp. rate (!) 22, SpO2 100 %. General: Chronically ill appearing gentleman tolerating BiPap well Head: Normocephalic, atraumatic  Eyes: Normal conjuctiva, EOMI  ENT: BiPap in place, JVD difficult to assess with scalene muscle use  CV: Borderline tachycardic, irregular, difficult to auscultate with respiratory sounds Resp: Basilar crackles and coarse breath sounds, accessory muscle use and tachypnea, no wheezing on current exam   Abd: Soft, +BS, non-tender to palpation, well healed incisional scar Extr: No LE edema  Neuro: Alert and oriented x3, moving extremities spontaneously, answering and following commands appropriately  Skin: Warm, dry    CXR: personally reviewed my interpretation is interval improvement in prior R basilar findings, increased interstitial and vascular markings bilaterally.   Assessment & Plan by Problem:  Acute on Chronic Respiratory Failure Presenting with SOB, increased work of breathing, and hypoxia reported prior to arrival, currently on BiPap. CXR notable for pulmonary vascular congestion, improved aeration in R base at site of prior pneumonia. He has multiple co-morbidities contributing to poor respiratory status including HFrEF, COPD, recent pneumonia. In addition, he was recently hospitalized and is off Xarelto, is also on amiodarone which can lead to chronic lung injury. CXR and clinical picture does not appear c/w recurrent pneumonia. May be volume driven with HFrEF, BNP 684 and PND type onset. Initial IV Lasix dose ordered in ED, will assess response. Will also treat with breathing treatments, hold further steroids for now.  --Monitor vital signs, pulse ox, check temp --Cont BiPAP, wean as tolerated  --Duoneb q6hrs, albuterol neb q2 hr prn    --Assess Lasix response, strict I/Os  --Repeat Echo --Cont home maintenance inhaler equivalents --Procalcitonin --CBC, BMP     HFrEF, CAD Last echo 03/2017 with EF 16-10% with diastolic dysfunction (estimate of 25-35% EF by cath also in 03/2017). Not currently on home diuretic, Imdur discontinued on last discharge. Has likely had increased salt load with diet, not on home diuretics. Work-up/management as above.    COPD On home O2 prn, has been using more often per report. Has been inappropriately using symbicort as rescue inhaler, otherwise on all home inhalers. Has received solu-medrol prior to arrival (likely cause of mild leukocytosis), no wheezing on current exam. Will hold further steroids for now. Management as above.   Atrial Fibrillation Rate controlled, on home amiodarone. Xarelto being held for anemia noted on last admission. --Cont home amiodarone --Tele     Anemia Required transfusion at last admission, no source of bleeding identified and no new signs/sx reported. Hgb improved compared to discharge  at 7.8, prior baseline 8-9.   HTN Normo to hypotensive on last admission. BP currently elevated, will hold anti-hypertensives and monitor with diuresis.    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 He has since been on chronic suppressive oral abx after declining prolonged IV antibiotics via PICC line. He has most recently been on cefuroxime per ID recommendations. Daughter reports taking the last dose 5/13. She reports this was instructed by ID, though no documentation of this plan. Will attempt to clarify in am.       Dispo: Admit patient to Inpatient with expected length of stay greater than 2 midnights.  Signed: Tawny Asal, MD 01/18/2018, 11:49 PM  Pager: [phone]

## 2018-01-18 NOTE — ED Notes (Signed)
Unable to get  Temp

## 2018-01-18 NOTE — ED Provider Notes (Signed)
[facility] EMERGENCY DEPARTMENT Provider Note   CSN: [id number] Arrival date & time: 01/18/18  2219     History   Chief Complaint Chief Complaint  Patient presents with  . Respiratory Distress    HPI level 5 caveat secondary to respiratory distress. [name] is a 72 y.o. male.  72 year old male brought in by EMS on BiPAP.  Reportedly was more short of breath today and was found by EMS with sats in the 70s diaphoretic pale.  They placed him on BiPAP gave him 15 mg of albuterol 2 g of mag and Solu-Medrol 125.  Patient arrives here with 2-3 word dyspnea and unable to give much history other than feeling short of breath but denying pain.  He was just admitted about 10 days ago for same.  The history is provided by the patient and the EMS personnel.  Shortness of Breath  This is a recurrent problem. The current episode started 3 to 5 hours ago. The problem has not changed since onset.Associated symptoms include cough. Pertinent negatives include no fever. It is unknown what precipitated the problem. He has had prior hospitalizations. He has had prior ED visits. Associated medical issues include COPD, pneumonia and heart failure.    Past Medical History:  Diagnosis Date  . AAA (abdominal aortic aneurysm) ([place])   . AAA (abdominal aortic aneurysm) without rupture ([place])   . Acute respiratory failure ([place]) 03/13/2017  . Anxiety   . Atrial fibrillation (HCC)     Brief episode of  . Atrial fibrillation with rapid ventricular response ([place]) 01/06/2016  . Cardiomyopathy, ischemic 10/27/2014  . CHF (congestive heart failure) ([place])   . COPD (chronic obstructive pulmonary disease) ([place])   . Coronary artery disease    Multivessel, S/p stenting to LCx. LHC 03/16/17: unchanged MVD- 50%mid LAD, 85% 1st daig, 100% prox circ, 100% prox RCA  . Depression   . Dyslipidemia   . Headache   . Hyperlipidemia   . Hypertension   . Myocardial infarction ([place])   . Peripheral vascular  disease ([place])   . 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 Shortness of breath dyspnea   . Stroke ([place])   . Tobacco dependence   . Ventricular dysfunction    Mild Left    Patient Active Problem List   Diagnosis Date Noted  . Community acquired pneumonia of right lower lobe of lung ([place])   . Acute on chronic combined systolic and diastolic heart failure ([place])   . Pneumonia 01/08/2018  . 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 Postoperative groin pseudoaneurysm ([place]) 09/08/2017  . PAD (peripheral artery disease) ([place]) 09/08/2017  . Hypotension 03/19/2017  . Bradycardia 03/19/2017  . Anemia 03/19/2017  . Hypokalemia 03/19/2017  . NSTEMI (non-ST elevated myocardial infarction) ([place])   . Coagulase negative Staphylococcus bacteremia 03/16/2017  . Demand ischemia of myocardium: Severe 3 vessel CAD, with hypoxic respiratory failure 03/16/2017  . Elevated troponin   . Pressure injury of skin 03/11/2017  . Acute respiratory failure ([place]) 03/10/2017  . Chronic obstructive pulmonary disease ([place])   . Atrial fibrillation with rapid ventricular response ([place]) 01/06/2016  . Abdominal aortic aneurysm (AAA) without rupture ([place]) 01/03/2016  . S/P AAA (abdominal aortic aneurysm) repair   . Cigarette smoker   . Acute respiratory failure with hypoxia ([place])   . Systolic congestive heart failure ([place])   . Cardiomyopathy, ischemic 10/27/2014  . Chronic cough   . Pulmonary infiltrate   . COPD  GOLD  III/ still smoking  10/15/2014  . Bronchiectasis with acute exacerbation ([place]) 10/15/2014  . Tobacco abuse disorder 10/15/2014  . Persistent atrial fibrillation ([place])   . SVT (supraventricular tachycardia) ([place]) 10/14/2014  . 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 Abdominal mass 03/18/2013  . AAA (abdominal aortic aneurysm) without rupture ([place]) 03/18/2013  . Weight loss 05/07/2011  .  HYPERCHOLESTEROLEMIA  IIA 09/18/2008  . HYPERTENSION, BENIGN 09/18/2008  . CAD, NATIVE VESSEL 09/18/2008    Past Surgical History:  Procedure Laterality Date  . ABDOMINAL AORTIC ANEURYSM REPAIR  01/03/2016   Procedure: ANEURYSM ABDOMINAL AORTIC REPAIR USING 14MM X 8MM X40CM HEMASHIELD GOLD GRAFT;  Surgeon: Conrad Atlantic, MD;  Location: [facility] OR;  Service: Vascular;;  . ABDOMINAL AORTIC ENDOVASCULAR STENT GRAFT N/A 01/03/2016   Procedure: ABDOMINAL AORTIC ENDOVASCULAR STENT GRAFT IMPLANTED/EXPLANTED;  Surgeon: Conrad Summerside, MD;  Location: [facility];  Service: Vascular;  Laterality: N/A;  . AXILLARY-FEMORAL BYPASS GRAFT Bilateral 09/08/2017   Procedure: BYPASS GRAFT AXILLA-BIFEMORAL;  Surgeon: Rosetta Posner, MD;  Location: [facility];  Service: Vascular;  Laterality: Bilateral;  . BACK SURGERY     cervical  . FALSE ANEURYSM REPAIR Bilateral 09/08/2017   Procedure: Bilateral Groin Exploration, Removal of Prosthetic graft. Vein Patch Closure of bilateral common femoral Arteries.;  Surgeon: Rosetta Posner, MD;  Location[facility];  Service: Vascular;  Laterality: Bilateral;  . HAND SURGERY Left    thumb surgery  . HERNIA REPAIR Right   . LEFT HEART CATH AND CORONARY ANGIOGRAPHY N/A 03/16/2017   Procedure: Left Heart Cath and Coronary Angiography;  Surgeon: Martinique, Peter M, MD;  Location: [facility] CV LAB;  Service: Cardiovascular;  Laterality: N/A;  . LEFT HEART CATHETERIZATION WITH CORONARY ANGIOGRAM N/A 10/27/2014   Procedure: LEFT HEART CATHETERIZATION WITH CORONARY ANGIOGRAM;  Surgeon: Blane Ohara, MD;  Location: [facility] CATH LAB;  Service: Cardiovascular;  Laterality: N/A;  . NECK SURGERY    . stent     pt unsure of location  . US ECHOCARDIOGRAPHY  12-26-2008   EF 40-45%  . WRIST SURGERY Right         Home Medications    Prior to Admission medications   Medication Sig Start Date End Date Taking? Authorizing Provider  albuterol (PROVENTIL) (2.5 MG/3ML) 0.083% nebulizer solution Take 2.5 mg by  nebulization every 6 (six) hours as needed for wheezing or shortness of breath.    [provider]  amiodarone (PACERONE) 200 MG tablet TAKE 1 TABLET BY MOUTH ONCE DAILY 04/15/17   Nahser, Wonda Cheng, MD  aspirin EC 81 MG tablet Take 81 mg by mouth daily.    [provider]  atorvastatin (LIPITOR) 80 MG tablet Take 1 tablet (80 mg total) by mouth daily at 6 PM. 10/17/14   Almyra Deforest, PA  azithromycin (ZITHROMAX) 250 MG tablet Take 1 tablet (250 mg total) by mouth daily. 01/10/18   Velna Ochs, MD  BREO ELLIPTA 100-25 MCG/INH AEPB Inhale 1 puff into the lungs as needed.  11/17/15   [provider]  budesonide-formoterol (SYMBICORT) 160-4.5 MCG/ACT inhaler Inhale 2 puffs into the lungs 2 (two) times daily. Patient taking differently: Inhale 2 puffs into the lungs 4 (four) times daily as needed.  01/01/17   Tanda Rockers, MD  cefUROXime (CEFTIN) 500 MG tablet Take 500 mg by mouth 2 (two) times daily with a meal.    [provider]  folic acid (FOLVITE) 1 MG tablet Place 1 tablet (1 mg total) into feeding tube daily. Patient taking differently: Take 400 mg by mouth daily.  03/18/17   Reyne Dumas, MD  [facility] packet Take 17 g by mouth daily.  06/13/17   [provider]  mirtazapine (REMERON) 45 MG tablet Take 45 mg by mouth at bedtime.  08/30/15   [provider]  nitroGLYCERIN (NITROSTAT) 0.4 MG SL tablet Place 0.4 mg under the tongue every 5 (five) minutes as needed for chest pain.     [provider]  thiamine 100 MG tablet Take 1 tablet (100 mg total) by mouth daily. Patient taking differently: Take 100 mg by mouth at bedtime.  03/18/17   Reyne Dumas, MD  tiZANidine (ZANAFLEX) 4 MG tablet Take 4 mg by mouth every 8 (eight) hours as needed. At bedtime    [provider]    Family History Family History  Problem Relation Age of Onset  . AAA (abdominal aortic aneurysm) Mother   . Cancer Father        bone marrow    Social  History Social History   Tobacco Use  . Smoking status: Current Every Day Smoker    Packs/day: 0.50    Years: 20.00    Pack years: 10.00    Types: Cigarettes  . Smokeless tobacco: Never Used  Substance Use Topics  . Alcohol use: No  . Drug use: No     Allergies   Ibuprofen and Aspirin   Review of Systems Review of Systems  Unable to perform ROS: Acuity of condition  Constitutional: Negative for fever.  Respiratory: Positive for cough and shortness of breath.      Physical Exam Updated Vital Signs Pulse 97   Resp (!) 26   SpO2 99%   Physical Exam  Constitutional: He appears well-developed and well-nourished.  HENT:  Head: Normocephalic and atraumatic.  Right Ear: External ear normal.  Left Ear: External ear normal.  Nose: Nose normal.  Mouth/Throat: Oropharynx is clear and moist.  Eyes: Pupils are equal, round, and reactive to light. EOM are normal.  Neck: Normal range of motion. Neck supple.  Cardiovascular: Normal rate, regular rhythm and normal heart sounds.  Pulmonary/Chest: Accessory muscle usage present. Tachypnea noted. He is in respiratory distress. He has rales.  Abdominal: There is no tenderness. There is no rigidity and no guarding.  Musculoskeletal: Normal range of motion. He exhibits no tenderness or deformity.  Neurological: He is alert. He has normal strength.  Skin: Capillary refill takes less than 2 seconds. No rash noted. He is diaphoretic. There is pallor.     ED Treatments / Results  Labs (all labs ordered are listed, but only abnormal results are displayed) Labs Reviewed  BASIC METABOLIC PANEL - Abnormal; Notable for the following components:      Result Value   Glucose, Bld 360 (*)    BUN 22 (*)    Calcium 8.6 (*)    All other components within normal limits  MAGNESIUM - Abnormal; Notable for the following components:   Magnesium 3.7 (*)    All other components within normal limits  BRAIN NATRIURETIC PEPTIDE - Abnormal; Notable  for the following components:   B Natriuretic Peptide 684.3 (*)    All other components within normal limits  CBC - Abnormal; Notable for the following components:   WBC 13.4 (*)    RBC 3.42 (*)    Hemoglobin 7.8 (*)    HCT 28.2 (*)  MCH 22.8 (*)    MCHC 27.7 (*)    RDW 20.4 (*)    All other components within normal limits  BASIC METABOLIC PANEL - Abnormal; Notable for the following components:   Glucose, Bld 149 (*)    BUN 22 (*)    Calcium 8.8 (*)    All other components within normal limits  CBC - Abnormal; Notable for the following components:   RBC 3.26 (*)    Hemoglobin 7.6 (*)    HCT 26.4 (*)    MCH 23.3 (*)    MCHC 28.8 (*)    RDW 20.3 (*)    All other components within normal limits  TROPONIN I - Abnormal; Notable for the following components:   Troponin I 0.03 (*)    All other components within normal limits  PROTIME-INR  PROCALCITONIN  RAPID URINE DRUG SCREEN, HOSP PERFORMED  I-STAT TROPONIN, ED  I-STAT VENOUS BLOOD GAS, ED  TYPE AND SCREEN    EKG None  Radiology Dg Chest Portable 1 View  Result Date: 01/18/2018 CLINICAL DATA:  Acute onset of diaphoresis. Decreased O2 saturation. EXAM: PORTABLE CHEST 1 VIEW COMPARISON:  Chest radiograph performed 01/08/2018 FINDINGS: The lungs are well-aerated. Vascular congestion is noted. Increased interstitial markings raise concern for pulmonary edema. There is no evidence of focal opacification, pleural effusion or pneumothorax. The cardiomediastinal silhouette is borderline normal in size. No acute osseous abnormalities are seen. IMPRESSION: Vascular congestion noted. Increased interstitial markings raise concern for pulmonary edema. Electronically Signed   By: Garald Balding M.D.   On: 01/18/2018 23:25    Procedures .Critical Care Performed by: Hayden Rasmussen, MD Authorized by: Hayden Rasmussen, MD   Critical care provider statement:    Critical care time (minutes):  40   Critical care time was exclusive of:   Separately billable procedures and treating other patients   Critical care was necessary to treat or prevent imminent or life-threatening deterioration of the following conditions:  Respiratory failure   Critical care was time spent personally by me on the following activities:  Development of treatment plan with patient or surrogate, discussions with consultants, evaluation of patient's response to treatment, examination of patient, obtaining history from patient or surrogate, ordering and performing treatments and interventions, ordering and review of laboratory studies, ordering and review of radiographic studies, pulse oximetry, re-evaluation of patient's condition, review of old charts and ventilator management   I assumed direction of critical care for this patient from another provider in my specialty: no     (including critical care time)  Medications Ordered in ED Medications  sodium chloride flush (NS) 0.9 % injection 3 mL (3 mLs Intravenous Given 01/19/18 0058)  acetaminophen (TYLENOL) tablet 650 mg (650 mg Oral Given 01/19/18 1016)    Or  acetaminophen (TYLENOL) suppository 650 mg ( Rectal See Alternative 01/19/18 1016)  albuterol (PROVENTIL) (2.5 MG/3ML) 0.083% nebulizer solution 2.5 mg (has no administration in time range)  aspirin EC tablet 81 mg (81 mg Oral Given 01/19/18 1016)  amiodarone (PACERONE) tablet 200 mg (200 mg Oral Given 01/19/18 1016)  atorvastatin (LIPITOR) tablet 80 mg (has no administration in time range)  fluticasone furoate-vilanterol (BREO ELLIPTA) 100-25 MCG/INH 1 puff (1 puff Inhalation Not Given 01/19/18 0904)  ipratropium-albuterol (DUONEB) 0.5-2.5 (3) MG/3ML nebulizer solution 3 mL (3 mLs Nebulization Given 01/19/18 0913)  furosemide (LASIX) injection 20 mg (20 mg Intravenous Given 01/18/18 2354)  furosemide (LASIX) injection 20 mg (20 mg Intravenous Given 01/19/18 0837)  Initial Impression / Assessment and Plan / ED Course  I have reviewed the triage vital  signs and the nursing notes.  Pertinent labs & imaging results that were available during my care of the patient were reviewed by me and considered in my medical decision making (see chart for details).  Clinical Course as of Jan 20 1132  Mon Jan 18, 2018  2223 Last echo 7/18 -  Impressions:  - LVEF 50-55%, moderately dilated ventricle with normal wall   thickness, diastolic dysfunction and indeterminate LV filling   pressure, moderate MR, severe LAE, mild RAE, dilated IVC (on   ventilator)   [MB]  2254 Reviewing patient's last admission he has a complicated history.  Sounds like he has baseline COPD but also has had anemia requiring transfusions.  During the last admission he had a transfusion which fluid overloaded necessitated him being on BiPAP.  They also were treating him for pneumonia.  Daughter states he has needed transfusions in the past and it is not clear where he is bleeding from.   [MB]  2307 EKG sinus tachycardia rate 102.  There is poor R wave progression anteriorly probably an old MI.  There is ventricular conduction delay and ST depressions V4 5 6 and 1 and L similar to prior EKG.  Compared with 01/08/2018.   [MB]  2327 Discussed with the internal medicine team who will evaluate the patient in the ED for admission.   [MB]  2336 Chest x-ray is back and being read as likely fluid overload.  BNP still pending but will order 20 Lasix as patient is not on diuretics best I can tell.   [MB]    Clinical Course User Index [MB] Hayden Rasmussen, MD     Final Clinical Impressions(s) / ED Diagnoses   Final diagnoses:  Respiratory distress  Acute on chronic congestive heart failure, unspecified heart failure type ([place])  Anemia, unspecified type    ED Discharge Orders    None       Hayden Rasmussen, MD 01/19/18 1137

## 2018-01-18 NOTE — ED Triage Notes (Signed)
Pt arriving from home via [facility] on CPAP.  PT family called because pt has been "unable to move air" for a few hours.  When EMS arrived at his home pt was satting 78% on RA and was diaphoretic and pale. PT put on CPAP and was satting 98% upon hospital arrival.  EMS reports a bp of 1652/97, and he received 15 of Albuterol, 125 Solumedrol, 2g mag.

## 2018-01-19 ENCOUNTER — Other Ambulatory Visit (HOSPITAL_COMMUNITY): Payer: Medicare Other

## 2018-01-19 ENCOUNTER — Encounter (HOSPITAL_COMMUNITY): Payer: Self-pay | Admitting: *Deleted

## 2018-01-19 ENCOUNTER — Other Ambulatory Visit: Payer: Self-pay

## 2018-01-19 ENCOUNTER — Inpatient Hospital Stay (HOSPITAL_COMMUNITY): Payer: Medicare Other

## 2018-01-19 DIAGNOSIS — I480 Paroxysmal atrial fibrillation: Secondary | ICD-10-CM

## 2018-01-19 DIAGNOSIS — I714 Abdominal aortic aneurysm, without rupture: Secondary | ICD-10-CM

## 2018-01-19 DIAGNOSIS — T827XXA Infection and inflammatory reaction due to other cardiac and vascular devices, implants and grafts, initial encounter: Secondary | ICD-10-CM

## 2018-01-19 DIAGNOSIS — J449 Chronic obstructive pulmonary disease, unspecified: Secondary | ICD-10-CM

## 2018-01-19 DIAGNOSIS — I110 Hypertensive heart disease with heart failure: Principal | ICD-10-CM

## 2018-01-19 DIAGNOSIS — Z9889 Other specified postprocedural states: Secondary | ICD-10-CM

## 2018-01-19 DIAGNOSIS — Z79899 Other long term (current) drug therapy: Secondary | ICD-10-CM

## 2018-01-19 DIAGNOSIS — Z792 Long term (current) use of antibiotics: Secondary | ICD-10-CM

## 2018-01-19 DIAGNOSIS — I5023 Acute on chronic systolic (congestive) heart failure: Secondary | ICD-10-CM | POA: Diagnosis present

## 2018-01-19 DIAGNOSIS — J9621 Acute and chronic respiratory failure with hypoxia: Secondary | ICD-10-CM

## 2018-01-19 DIAGNOSIS — I5043 Acute on chronic combined systolic (congestive) and diastolic (congestive) heart failure: Secondary | ICD-10-CM

## 2018-01-19 DIAGNOSIS — Z955 Presence of coronary angioplasty implant and graft: Secondary | ICD-10-CM

## 2018-01-19 DIAGNOSIS — Z9981 Dependence on supplemental oxygen: Secondary | ICD-10-CM

## 2018-01-19 DIAGNOSIS — Z8679 Personal history of other diseases of the circulatory system: Secondary | ICD-10-CM

## 2018-01-19 DIAGNOSIS — D649 Anemia, unspecified: Secondary | ICD-10-CM

## 2018-01-19 DIAGNOSIS — Z8701 Personal history of pneumonia (recurrent): Secondary | ICD-10-CM

## 2018-01-19 DIAGNOSIS — Z7951 Long term (current) use of inhaled steroids: Secondary | ICD-10-CM

## 2018-01-19 DIAGNOSIS — I779 Disorder of arteries and arterioles, unspecified: Secondary | ICD-10-CM

## 2018-01-19 DIAGNOSIS — F17210 Nicotine dependence, cigarettes, uncomplicated: Secondary | ICD-10-CM

## 2018-01-19 DIAGNOSIS — I4891 Unspecified atrial fibrillation: Secondary | ICD-10-CM

## 2018-01-19 DIAGNOSIS — I2510 Atherosclerotic heart disease of native coronary artery without angina pectoris: Secondary | ICD-10-CM

## 2018-01-19 DIAGNOSIS — Z8249 Family history of ischemic heart disease and other diseases of the circulatory system: Secondary | ICD-10-CM

## 2018-01-19 DIAGNOSIS — Z886 Allergy status to analgesic agent status: Secondary | ICD-10-CM

## 2018-01-19 DIAGNOSIS — Z9111 Patient's noncompliance with dietary regimen: Secondary | ICD-10-CM

## 2018-01-19 LAB — RAPID URINE DRUG SCREEN, HOSP PERFORMED
AMPHETAMINES: NOT DETECTED
BARBITURATES: NOT DETECTED
Benzodiazepines: NOT DETECTED
Cocaine: NOT DETECTED
Opiates: NOT DETECTED
Tetrahydrocannabinol: NOT DETECTED

## 2018-01-19 LAB — CBC
HEMATOCRIT: 26.4 % — AB (ref 39.0–52.0)
HEMOGLOBIN: 7.6 g/dL — AB (ref 13.0–17.0)
MCH: 23.3 pg — ABNORMAL LOW (ref 26.0–34.0)
MCHC: 28.8 g/dL — AB (ref 30.0–36.0)
MCV: 81 fL (ref 78.0–100.0)
Platelets: 260 10*3/uL (ref 150–400)
RBC: 3.26 MIL/uL — ABNORMAL LOW (ref 4.22–5.81)
RDW: 20.3 % — ABNORMAL HIGH (ref 11.5–15.5)
WBC: 7 10*3/uL (ref 4.0–10.5)

## 2018-01-19 LAB — BASIC METABOLIC PANEL
ANION GAP: 10 (ref 5–15)
BUN: 22 mg/dL — ABNORMAL HIGH (ref 6–20)
CO2: 25 mmol/L (ref 22–32)
Calcium: 8.8 mg/dL — ABNORMAL LOW (ref 8.9–10.3)
Chloride: 104 mmol/L (ref 101–111)
Creatinine, Ser: 0.95 mg/dL (ref 0.61–1.24)
GFR calc Af Amer: >60 mL/min (ref >60)
GFR calc non Af Amer: >60 mL/min (ref >60)
GLUCOSE: 149 mg/dL — AB (ref 65–99)
Potassium: 4.2 mmol/L (ref 3.5–5.1)
Sodium: 139 mmol/L (ref 135–145)

## 2018-01-19 LAB — TROPONIN I
Troponin I: 0.03 ng/mL (ref <0.03)
Troponin I: 0.03 ng/mL (ref <0.03)

## 2018-01-19 LAB — PROCALCITONIN: Procalcitonin: <0.1 ng/mL

## 2018-01-19 MED ORDER — DOXYCYCLINE HYCLATE 100 MG PO TABS
100.0000 mg | ORAL_TABLET | Freq: Two times a day (BID) | ORAL | Status: DC
  Administered 2018-01-19 – 2018-01-21 (×5): 100 mg via ORAL
  Filled 2018-01-19 (×6): qty 1

## 2018-01-19 MED ORDER — ASPIRIN EC 81 MG PO TBEC
81.0000 mg | DELAYED_RELEASE_TABLET | Freq: Every day | ORAL | Status: DC
  Administered 2018-01-19 – 2018-01-21 (×3): 81 mg via ORAL
  Filled 2018-01-19 (×3): qty 1

## 2018-01-19 MED ORDER — ALBUTEROL SULFATE (2.5 MG/3ML) 0.083% IN NEBU: 2.5000 mg | INHALATION_SOLUTION | RESPIRATORY_TRACT | Status: DC | PRN

## 2018-01-19 MED ORDER — TIZANIDINE HCL 4 MG PO TABS
4.0000 mg | ORAL_TABLET | Freq: Every evening | ORAL | Status: DC | PRN
  Administered 2018-01-19: 4 mg via ORAL
  Filled 2018-01-19: qty 1

## 2018-01-19 MED ORDER — FLUTICASONE FUROATE-VILANTEROL 100-25 MCG/INH IN AEPB
1.0000 | INHALATION_SPRAY | Freq: Every day | RESPIRATORY_TRACT | Status: DC
  Administered 2018-01-20 – 2018-01-21 (×2): 1 via RESPIRATORY_TRACT
  Filled 2018-01-19: qty 28

## 2018-01-19 MED ORDER — ONDANSETRON HCL 4 MG/2ML IJ SOLN
4.0000 mg | Freq: Four times a day (QID) | INTRAMUSCULAR | Status: DC | PRN
Start: 2018-01-19 — End: 2018-01-21

## 2018-01-19 MED ORDER — ATORVASTATIN CALCIUM 80 MG PO TABS
80.0000 mg | ORAL_TABLET | Freq: Every day | ORAL | Status: DC
  Administered 2018-01-19 – 2018-01-20 (×2): 80 mg via ORAL
  Filled 2018-01-19 (×3): qty 1

## 2018-01-19 MED ORDER — SODIUM CHLORIDE 0.9% FLUSH
3.0000 mL | Freq: Two times a day (BID) | INTRAVENOUS | Status: DC
  Administered 2018-01-19 – 2018-01-21 (×6): 3 mL via INTRAVENOUS

## 2018-01-19 MED ORDER — FUROSEMIDE 10 MG/ML IJ SOLN
20.0000 mg | Freq: Once | INTRAMUSCULAR | Status: AC
  Administered 2018-01-19: 20 mg via INTRAVENOUS
  Filled 2018-01-19: qty 2

## 2018-01-19 MED ORDER — AMIODARONE HCL 200 MG PO TABS
200.0000 mg | ORAL_TABLET | Freq: Every day | ORAL | Status: DC
  Administered 2018-01-19 – 2018-01-21 (×3): 200 mg via ORAL
  Filled 2018-01-19 (×3): qty 1

## 2018-01-19 MED ORDER — CEFUROXIME AXETIL 500 MG PO TABS
500.0000 mg | ORAL_TABLET | Freq: Two times a day (BID) | ORAL | Status: DC
  Administered 2018-01-19 – 2018-01-21 (×3): 500 mg via ORAL
  Filled 2018-01-19 (×3): qty 1

## 2018-01-19 MED ORDER — IPRATROPIUM-ALBUTEROL 0.5-2.5 (3) MG/3ML IN SOLN
3.0000 mL | Freq: Four times a day (QID) | RESPIRATORY_TRACT | Status: DC
  Administered 2018-01-19 (×3): 3 mL via RESPIRATORY_TRACT
  Filled 2018-01-19 (×4): qty 3

## 2018-01-19 MED ORDER — RAMELTEON 8 MG PO TABS: 8.0000 mg | ORAL_TABLET | Freq: Every day | ORAL | Status: DC

## 2018-01-19 MED ORDER — ACETAMINOPHEN 325 MG PO TABS
650.0000 mg | ORAL_TABLET | Freq: Four times a day (QID) | ORAL | Status: DC | PRN
  Administered 2018-01-19 (×2): 650 mg via ORAL
  Filled 2018-01-19 (×2): qty 2

## 2018-01-19 MED ORDER — RAMELTEON 8 MG PO TABS
8.0000 mg | ORAL_TABLET | Freq: Every evening | ORAL | Status: DC | PRN
  Administered 2018-01-20: 8 mg via ORAL
  Filled 2018-01-19 (×2): qty 1

## 2018-01-19 MED ORDER — RAMELTEON 8 MG PO TABS
8.0000 mg | ORAL_TABLET | Freq: Every day | ORAL | Status: DC
  Filled 2018-01-19: qty 1

## 2018-01-19 MED ORDER — ACETAMINOPHEN 650 MG RE SUPP: 650.0000 mg | Freq: Four times a day (QID) | RECTAL | Status: DC | PRN

## 2018-01-19 MED ORDER — NON FORMULARY: Freq: Two times a day (BID) | Status: DC

## 2018-01-19 NOTE — Care Management Note (Signed)
Case Management Note  Patient Details  Name: TAJON MORING MRN: [id number] Date of Birth: [birth year]  Subjective/Objective:               Patient from home, lives w son who works most of the day. Daughter also helps per patient with managing MD apts and coordination of care at home. Patient is on 2L O2 at home, he states [employer] is provider. He states that he is not currently active with HH, nor does he use any DME to ambulate. Patient has had 3 admissions in the last 6 months. CM will place [facility] consult to see if they can help with CHF multiple admissions.      Action/Plan:   Expected Discharge Date:                  Expected Discharge Plan:  [place]  In-House Referral:     Discharge planning Services  CM Consult  Post Acute Care Choice:    Choice offered to:     DME Arranged:    DME Agency:     HH Arranged:    [place] Agency:     Status of Service:  In process, will continue to follow  If discussed at Long Length of Stay Meetings, dates discussed:    Additional Comments:  Carles Collet, RN 01/19/2018, 3:11 PM

## 2018-01-19 NOTE — Consult Note (Addendum)
Cardiology Consultation:   Patient ID: [name]; [id number]; 1945-12-13   Admit date: 01/18/2018 Date of Consult: 01/19/2018  Primary Care Provider: Ardith Dark, PA-C Primary Cardiologist: Mertie Moores, MD   Patient Profile:   [name] is a 72 y.o. 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 He has had previous stenting of the LCx and RCA and known total occlusion of the LCx and RCA. His last cardiac cath 03/2017 showed 3 vessel obstructive CAD, with 50% mild LAD and 85% first diagonal, and known TOC of the LCx and RCA. The RPDA filled by collaterals from the 2nd sept. Agressive medical therapy was recommended. Dr. Martinique noted that he would not recommend PCI of the diagonal, unless he developed significant angina, despite medical managment.   He was recently admitted 5/3-01/10/2018 for CAP and acute on chronic combined systolic and diastolic CHF (acute CHF felt secondary to transfusion associated circulatory overload from blood transfusion for acute on chronic anemia). Per hospital records, he initially presented to the ED on 5/3 with complaint of dyspnea and productive cough. CXR showed RLL infiltrate. He was started on IVFs and IV antibiotics. His Hgb was also noted to be low at 6.1 and was given a blood transfusion  (baseline Hgb ~8-9). This resulted in worsening dyspnea, requiring BiPAP. This was felt secondary to circulatory overload from combination of blood transfusion and IVFs. He was admitted by the teaching service and treated with IV Lasix which resulted in significant improvement. His CAP was treated with antibiotics, PO azithromycin and ceftriaxone. Treatment resulted in symptomatic improvement and he was discharged home on 01/10/18. His discharge weight was 115 lb. No PO diuretics were prescribed. He was instructed to hold Xarelto given his anemia and instructed to f/u with his PCP for anemia w/u (note, no FOBT obtained during admit as pt had no BM during hospitalization).   He presented back to the [facility] ED on 01/18/18 via EMS for acute hypoxic respiratory failure. He became short of breath earlier in the day prior to arrival and called EMS. When they arrived to his home, he was reportedly hypoxic with O2 sats in the 70s. He require BiPAP. He was also given albuterol, magnesium and solu-medrol. CXR showed vascular congestion and increased interstitial markings, raising concern for pulmonary edema. BNP was abnormal at 684. He was started on IV Lasix.  He got 2 doses of IV Lasix yesterday, 20 mg in the ED, followed by 20 mg later in the evening. Only 600 cc documented in UOP. ? If strict I/Os are being documented.   Labs today show normal SCr at 0.95. K WNL at 4.2. Hgb is 7.6 (stable since transfusion on 5/3).  Pt reports feeling better today. Breathing better. No edema. Denies CP. He admits to eating a lot of frozen TV diners at  home.    Past Medical History:  Diagnosis Date  . AAA (abdominal aortic aneurysm) ([place])   . AAA (abdominal aortic aneurysm) without rupture ([place])   . Acute respiratory failure ([place]) 03/13/2017  . Anxiety   . Atrial fibrillation ([facility])     Brief episode of  . Atrial fibrillation with rapid ventricular response ([place]) 01/06/2016  . Cardiomyopathy, ischemic 10/27/2014  . CHF (congestive  heart failure) ([place])   . COPD (chronic obstructive pulmonary disease) ([place])   . Coronary artery disease    Multivessel, S/p stenting to LCx. LHC 03/16/17: unchanged MVD- 50%mid LAD, 85% 1st daig, 100% prox circ, 100% prox RCA  . Depression   . Dyslipidemia   . Headache   . Hyperlipidemia   . Hypertension   . Myocardial infarction ([place])   . Peripheral vascular disease ([place])   . 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 Shortness of breath dyspnea   . Stroke ([place])   . Tobacco dependence   . Ventricular dysfunction    Mild Left    Past Surgical History:  Procedure Laterality Date  . ABDOMINAL AORTIC ANEURYSM REPAIR  01/03/2016   Procedure: ANEURYSM ABDOMINAL AORTIC REPAIR USING 14MM X 8MM X40CM HEMASHIELD GOLD GRAFT;  Surgeon: Conrad Biola, MD;  Location: [facility] OR;  Service: Vascular;;  . ABDOMINAL AORTIC ENDOVASCULAR STENT GRAFT N/A 01/03/2016   Procedure: ABDOMINAL AORTIC ENDOVASCULAR STENT GRAFT IMPLANTED/EXPLANTED;  Surgeon: Conrad Brewster, MD;  Location: [place];  Service: Vascular;  Laterality: N/A;  . AXILLARY-FEMORAL BYPASS GRAFT Bilateral 09/08/2017   Procedure: BYPASS GRAFT AXILLA-BIFEMORAL;  Surgeon: Rosetta Posner, MD;  Location: [place];  Service: Vascular;  Laterality: Bilateral;  . BACK SURGERY     cervical  . FALSE ANEURYSM REPAIR Bilateral 09/08/2017   Procedure: Bilateral Groin Exploration, Removal of Prosthetic graft. Vein Patch Closure of bilateral common femoral Arteries.;  Surgeon: Rosetta Posner, MD;  Location: [facility] OR;  Service: Vascular;  Laterality: Bilateral;  . HAND SURGERY Left    thumb surgery  . HERNIA REPAIR Right   . LEFT HEART CATH AND CORONARY ANGIOGRAPHY N/A 03/16/2017   Procedure: Left Heart Cath and Coronary Angiography;  Surgeon: Martinique, Peter M, MD;  Location: [place] CV LAB;  Service: Cardiovascular;  Laterality: N/A;  . LEFT HEART CATHETERIZATION WITH CORONARY ANGIOGRAM N/A 10/27/2014   Procedure: LEFT HEART CATHETERIZATION WITH CORONARY ANGIOGRAM;  Surgeon: Blane Ohara, MD;  Location: [facility] CATH LAB;  Service: Cardiovascular;  Laterality: N/A;  . NECK SURGERY    . stent     pt unsure of location  . US ECHOCARDIOGRAPHY  12-26-2008   EF 40-45%  . WRIST SURGERY Right      Home Medications:  Prior to Admission medications   Medication Sig Start Date End Date Taking? Authorizing Provider  acetaminophen (TYLENOL) 325 MG tablet Take 650 mg by mouth every 6 (six) hours as needed for mild pain.   Yes [provider]  albuterol (PROVENTIL HFA;VENTOLIN HFA) 108 (90 Base) MCG/ACT inhaler Inhale 1 puff into the lungs every 6 (six) hours as needed for wheezing or shortness of breath.   Yes [provider]  albuterol (PROVENTIL) (2.5 MG/3ML) 0.083% nebulizer solution Take 2.5 mg by nebulization every 6 (six) hours as needed for wheezing or shortness of breath.   Yes [provider]  amiodarone (PACERONE) 200 MG tablet TAKE 1 TABLET BY MOUTH ONCE DAILY 04/15/17  Yes Nahser, Wonda Cheng, MD  aspirin EC 81 MG tablet Take  81 mg by mouth daily.   Yes [provider]  atorvastatin (LIPITOR) 80 MG tablet Take 1 tablet (80 mg total) by mouth daily at 6 PM. 10/17/14  Yes Meng, Hao, PA  BREO ELLIPTA 100-25 MCG/INH AEPB Inhale 1 puff into the lungs every 4 (four) hours as needed (wheezing).  11/17/15  Yes [provider]  budesonide-formoterol (SYMBICORT) 160-4.5 MCG/ACT inhaler Inhale 2 puffs into the lungs 2 (two) times daily. 01/01/17  Yes Tanda Rockers, MD  folic acid (FOLVITE) 1 MG tablet Place 1 tablet (1 mg total) into feeding tube daily. Patient taking differently: Take 400 mg by mouth daily.  03/18/17  Yes Reyne Dumas, MD  ibuprofen (ADVIL,MOTRIN) 200 MG tablet Take 200 mg by mouth every 6 (six) hours as needed for moderate pain.   Yes [provider]  mirtazapine (REMERON) 45 MG tablet Take 45 mg by mouth at bedtime.  08/30/15  Yes [provider]  nitroGLYCERIN (NITROSTAT) 0.4 MG SL tablet Place 0.4 mg under the  tongue every 5 (five) minutes as needed for chest pain.    Yes [provider]  thiamine 100 MG tablet Take 1 tablet (100 mg total) by mouth daily. 03/18/17  Yes Reyne Dumas, MD  tiZANidine (ZANAFLEX) 4 MG tablet Take 4 mg by mouth at bedtime. At bedtime   Yes [provider]    Inpatient Medications: Scheduled Meds: . amiodarone  200 mg Oral Daily  . aspirin EC  81 mg Oral Daily  . atorvastatin  80 mg Oral q1800  . fluticasone furoate-vilanterol  1 puff Inhalation Daily  . ipratropium-albuterol  3 mL Nebulization Q6H  . sodium chloride flush  3 mL Intravenous Q12H   Continuous Infusions:  PRN Meds: acetaminophen **OR** acetaminophen, albuterol  Allergies:   No Active Allergies  Social History:   Social History   Socioeconomic History  . Marital status: Widowed    Spouse name: Not on file  . Number of children: 1  . Years of education: Not on file  . Highest education level: Not on file  Occupational History  . Not on file  Social Needs  . Financial resource strain: Not on file  . Food insecurity:    Worry: Not on file    Inability: Not on file  . Transportation needs:    Medical: Not on file    Non-medical: Not on file  Tobacco Use  . Smoking status: Current Every Day Smoker    Packs/day: 0.50    Years: 20.00    Pack years: 10.00    Types: Cigarettes  . Smokeless tobacco: Never Used  Substance and Sexual Activity  . Alcohol use: No  . Drug use: No  . Sexual activity: Not Currently    Birth control/protection: None  Lifestyle  . Physical activity:    Days per week: Not on file    Minutes per session: Not on file  . Stress: Not on file  Relationships  . Social connections:    Talks on phone: Not on file    Gets together: Not on file    Attends religious service: Not on file    Active member of club or organization: Not on file    Attends meetings of clubs or organizations: Not on file    Relationship status: Not on file  . Intimate  partner violence:    Fear of current or ex partner: Not on file    Emotionally abused: Not on file    Physically  abused: Not on file    Forced sexual activity: Not on file  Other Topics Concern  . Not on file  Social History Narrative   ** Merged History Encounter **        Family History:    Family History  Problem Relation Age of Onset  . AAA (abdominal aortic aneurysm) Mother   . Cancer Father        bone marrow     ROS:  Please see the history of present illness.   All other ROS reviewed and negative.     Physical Exam/Data:   Vitals:   01/19/18 1245 01/19/18 1300 01/19/18 1303 01/19/18 1315  BP:   120/72   Pulse: 86 90  92  Resp: 18 18  20   Temp:   99.2 F (37.3 C)   TempSrc:   Oral   SpO2: 99% 97%  97%  Weight:      Height:        Intake/Output Summary (Last 24 hours) at 01/19/2018 1345 Last data filed at 01/19/2018 0700 Gross per 24 hour  Intake -  Output 600 ml  Net -600 ml   Filed Weights   01/19/18 0210  Weight: 116 lb (52.6 kg)   Body mass index is 18.72 kg/m.  General:  Well nourished, well developed, in no acute distress HEENT: normal Lymph: no adenopathy Neck: no JVD Endocrine:  No thryomegaly Vascular: No carotid bruits; FA pulses 2+ bilaterally without bruits  Cardiac:  normal S1, S2; RRR; no murmur  Lungs:  clear to auscultation bilaterally, no wheezing, rhonchi or rales  Abd: soft, nontender, no hepatomegaly  Ext: no edema Musculoskeletal:  No deformities, BUE and BLE strength normal and equal Skin: warm and dry  Neuro:  CNs 2-12 intact, no focal abnormalities noted Psych:  Normal affect   EKG:  The EKG was personally reviewed and demonstrates:  Admit EKG showed sinus tach 102 ST & T wave abnormality in lateral leads Telemetry:  Telemetry was personally reviewed and demonstrates:  NSR   Relevant CV Studies: LHC 03/16/2017  Procedures   Left Heart Cath and Coronary Angiography  Conclusion     Mid LAD lesion, 50  %stenosed.  1st Diag lesion, 85 %stenosed.  Ost Cx to Prox Cx lesion, 100 %stenosed.  Ost RCA to Mid RCA lesion, 100 %stenosed.  LV end diastolic pressure is normal.  There is severe left ventricular systolic dysfunction.  The left ventricular ejection fraction is 25-35% by visual estimate.   1. 3 vessel obstructive CAD    - 50% mid LAD, 85 % first diagonal    - 100% proximal LCx    -100% proximal RCA 2. Severe LV dysfunction 3. Normal LVEDP   2D Echo 03/10/2017 Study Conclusions  - Left ventricle: The cavity size was moderately dilated. There was   moderate concentric hypertrophy. Systolic function was mildly to   moderately reduced. The estimated ejection fraction was in the   range of 40% to 45%. Severe hypokinesis of the lateral and   inferior myocardium. Diastolic dysfunction with indeterminate LV   filling pressure. - Mitral valve: Mildly thickened leaflets . There was moderate   regurgitation. - Left atrium: Severely dilated. - Right atrium: The atrium was mildly dilated. - Inferior vena cava: The vessel was dilated. The respirophasic   diameter changes were blunted (< 50%), consistent with elevated   central venous pressure. (On ventilator)   Laboratory Data:  Chemistry Recent Labs  Lab 01/18/18 2234 01/19/18 0505  NA 136 139  K 4.6 4.2  CL 105 104  CO2 22 25  GLUCOSE 360* 149*  BUN 22* 22*  CREATININE 1.11 0.95  CALCIUM 8.6* 8.8*  GFRNONAA >60 >60  GFRAA >60 >60  ANIONGAP 9 10    No results for input(s): PROT, ALBUMIN, AST, ALT, ALKPHOS, BILITOT in the last 168 hours. Hematology Recent Labs  Lab 01/18/18 2234 01/19/18 0505  WBC 13.4* 7.0  RBC 3.42* 3.26*  HGB 7.8* 7.6*  HCT 28.2* 26.4*  MCV 82.5 81.0  MCH 22.8* 23.3*  MCHC 27.7* 28.8*  RDW 20.4* 20.3*  PLT 396 260   Cardiac Enzymes Recent Labs  Lab 01/19/18 0713  TROPONINI 0.03*    Recent Labs  Lab 01/18/18 2239  TROPIPOC 0.00    BNP Recent Labs  Lab 01/18/18 2234  BNP  684.3*    DDimer No results for input(s): DDIMER in the last 168 hours.  Radiology/Studies:  Dg Chest Portable 1 View  Result Date: 01/18/2018 CLINICAL DATA:  Acute onset of diaphoresis. Decreased O2 saturation. EXAM: PORTABLE CHEST 1 VIEW COMPARISON:  Chest radiograph performed 01/08/2018 FINDINGS: The lungs are well-aerated. Vascular congestion is noted. Increased interstitial markings raise concern for pulmonary edema. There is no evidence of focal opacification, pleural effusion or pneumothorax. The cardiomediastinal silhouette is borderline normal in size. No acute osseous abnormalities are seen. IMPRESSION: Vascular congestion noted. Increased interstitial markings raise concern for pulmonary edema. Electronically Signed   By: Garald Balding M.D.   On: 01/18/2018 23:25    Assessment and Plan:   [name] is a 72 y.o. 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   1. Acute on Chronic Combined Systolic and Diastolic HF: h/o ICM with EF as low as 35% in the past. Last echo in 2018 showed EF of 40-45%. Admitted for acute hypoxic respiratory failure, requiring BiPAP, w/ BNP abnormal at 684 and CXR showing vascular congestion and increased interstitial markings, raising concern for pulmonary edema. IV Lasix given, 20 mg BID yesterday. Pt notes symptomatic improvement. Resting dyspnea resolved. No peripheral edema noted on exam. No crackles on exam. Renal function and K stable. He will likely need a standing dose of oral lasix or PRN to take based on daily weights. We discussed avoidance of high sodium foods such as  frozen TV dinners, which he usually eats.   2. CAD:  His last cardiac cath 03/2017 showed 3 vessel obstructive CAD, with 50% mild LAD and 85% first diagonal, and known TOC of the LCx and RCA. The RPDA filled by collaterals from the 2nd sept. Agressive medical therapy was recommended. He denies recent anginal symptoms. Continue ASA and statin. His Imdur was discontinued previous admit due to soft BP.   3. PAF: maintaining NSR with amiodarone. His Xarelto was stopped previous admission 01/10/18 for anemia.   4. Anemia: baseline Hgb ~8-9, but recent drop from baseline 01/08/18, down to 6.1, requiring transfusion. Hgb has remained stable in the 7 range since then, now at 7.6. He denies melena and hematochezia. FOBT ordered. IM to continue w/u for anemia. We recommend  another blood transfusion, given his CAD, to try to keep Hgb > 8.0, but will need IV Lasix post transfusion to prevent circulatory overload. Continue to hold Xarelto.   5. AAA s/p endovascular stenting in 7943, complicated by graft infection: Followed by vascular surgery. Pt refused to have main body of ABF removed. He is on chronic antibiotics.   6. COPD: per primary team.    For questions or updates, please contact [place] Please consult [email or web address] for contact info under Cardiology/STEMI.   Signed, Lyda Jester, PA-C  01/19/2018 1:45 PM   Personally seen and examined. Agree with above.  Currently resting comfortably in bed, does not appear to be short of breath, no chest pain.  GEN: Thin, in no acute distress  HEENT: normal  Neck: no JVD, carotid bruits, or masses Cardiac: RRR; no murmurs, rubs, or gallops,no edema  Respiratory:  clear to auscultation bilaterally, normal work of breathing GI: soft, nontender, nondistended, + BS MS: no deformity or atrophy  Skin: warm and dry, no rash Neuro:  Alert and Oriented x 3, Strength and sensation are intact Psych: euthymic mood, full affect  Lab work personally reviewed  hemoglobin still less than 8 Echo: EF 40 to 45%  Assessment and plan  Acute on chronic diastolic and systolic heart failure -EF 40 to 45%.  Appears euvolemic currently.  I would suggest sending him home with Lasix 20 mg once a day.  He was taking it as needed previously.  No other medication changes.  Coronary artery disease -Coronary anatomy reviewed.  Continue with medical management.  Anemia -Hemoglobin currently 7.6.  I would recommend 1 unit blood transfusion for goal hemoglobin greater than 8 given his underlying coronary artery disease.  Give IV Lasix posttransfusion.  Paroxysmal atrial fibrillation - Currently off of anticoagulation because of severe anemia.  Continue work-up for this.  Candee Furbish, MD

## 2018-01-19 NOTE — Progress Notes (Signed)
Inpatient Diabetes Program Recommendations  AACE/ADA: New Consensus Statement on Inpatient Glycemic Control (2015)  Target Ranges:  Prepandial:   less than 140 mg/dL      Peak postprandial:   less than 180 mg/dL (1-2 hours)      Critically ill patients:  140 - 180 mg/dL   Lab Results  Component Value Date   GLUCAP 85 01/09/2018   HGBA1C 6.2 (H) 03/10/2017   Results for ADIT, RIDDLES (MRN [id number]) as of 01/19/2018 12:45  Ref. Range 01/18/2018 22:34 01/18/2018 22:39 01/19/2018 05:05  Glucose Latest Ref Range: 65 - 99 mg/dL 360 (H)  149 (H)    Diabetes history: no DM history Outpatient Diabetes medications: none Current orders for Inpatient glycemic control: none  Inpatient Diabetes Program Recommendations:    Last A1C was from 03/2017, may want to consider repeating A1C? BS on 5/13 was 360 mg/dL, unsure of accuracy but may be worth determining A1C.  If BS continue to be elevated >120 mg/dL, consider adding Novolog 0-9 units TID.   Thanks, Bronson Curb, MSN, RNC-OB Diabetes Coordinator [phone] (8a-5p)

## 2018-01-19 NOTE — ED Notes (Signed)
Heart Healthy lunch diet tray ordered.

## 2018-01-19 NOTE — Discharge Summary (Addendum)
Name: [name] MRN: [id number] DOB: [birth year] 72 y.o. PCP: Ardith Dark, PA-C  Date of Admission: 01/18/2018 10:19 PM Date of Discharge: 5/16/20195/16/2019 Attending Physician: No att. providers found  Discharge Diagnosis: 1. Acute on chronic heart failure exacerbation 2. Afib 3. 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 Anemia 6. Obstructive CAD  Active Problems:   Chronic obstructive pulmonary disease (HCC)   PAD (peripheral artery disease) (HCC)   Acute on chronic combined systolic and diastolic heart failure (HCC)   Acute on chronic respiratory failure with hypoxia (HCC)   Acute on chronic HFrEF (heart failure with reduced ejection fraction) ([place])   Discharge Medications: Allergies as of 01/21/2018   No Active Allergies     Medication List    STOP taking these medications   ibuprofen 200 MG tablet Commonly known as:  ADVIL,MOTRIN     TAKE these medications   acetaminophen 325 MG tablet Commonly known as:  TYLENOL Take 650 mg by mouth every 6 (six) hours as needed for mild pain.   albuterol (2.5 MG/3ML) 0.083% nebulizer solution Commonly known as:  PROVENTIL Take 2.5 mg by nebulization every 6 (six) hours as needed for wheezing or shortness of breath. Notes to patient:  Did not take this medication during the hospital visit. Take as prescribed at home.    albuterol 108 (90 Base) MCG/ACT inhaler Commonly known as:  PROVENTIL HFA;VENTOLIN HFA Inhale 1 puff into the lungs every 6 (six) hours as needed for wheezing or shortness of breath. Notes to patient:  Did not take this medication during the hospital visit. Take as prescribed at home.    amiodarone 200 MG tablet Commonly known as:  PACERONE TAKE 1 TABLET BY MOUTH ONCE DAILY   aspirin EC 81 MG tablet Take 81 mg by mouth daily.   atorvastatin 80 MG tablet Commonly known as:  LIPITOR Take 1 tablet (80 mg total) by mouth daily at 6 PM.   BREO  ELLIPTA 100-25 MCG/INH Aepb Generic drug:  fluticasone furoate-vilanterol Inhale 1 puff into the lungs every 4 (four) hours as needed (wheezing).   budesonide-formoterol 160-4.5 MCG/ACT inhaler Commonly known as:  SYMBICORT Inhale 2 puffs into the lungs 2 (two) times daily. Notes to patient:  Did not take this medication during the hospital visit. Take as prescribed at home.    cefUROXime 500 MG tablet Commonly known as:  CEFTIN Take 1 tablet (500 mg total) by mouth 2 (two) times daily with a meal.   doxycycline 100 MG tablet Commonly known as:  VIBRA-TABS Take 1 tablet (100 mg total) by mouth every 12 (twelve) hours.   folic acid 1 MG tablet Commonly known as:  FOLVITE Take 1 tablet (1 mg total) by mouth daily. What changed:  how to take this   furosemide 20 MG tablet Commonly known as:  LASIX Take 1 tablet (20 mg total) by mouth daily.   mirtazapine 45 MG tablet Commonly known as:  REMERON Take 45 mg by mouth at bedtime. Notes to patient:  Did not take this medication during the hospital visit. Take as prescribed at home.    nitroGLYCERIN 0.4 MG SL tablet Commonly known as:  NITROSTAT Place 0.4 mg under the tongue every 5 (five) minutes as needed for chest pain.   thiamine 100 MG tablet Take 1 tablet (100 mg total) by mouth daily. Notes to patient:  Did not take this medication during the hospital visit. Take as prescribed at home.  tiZANidine 4 MG tablet Commonly known as:  ZANAFLEX Take 4 mg by mouth at bedtime. At bedtime Notes to patient:  Did not take this medication during the hospital visit. Take as prescribed at home.        Disposition and follow-up:   Mr.[name] was discharged from [facility] in Stable condition.  At the hospital follow up visit please address:  1.  Heart failure exacerbation - Discharged with lasix 20mg  daily, per Cardiology recs. Please assess volume status, check BMP, and adjust diuretic dose  accordingly.  2.  Afib - Patient instructed to hold xarelto given anemia. Would like him to be back on the Xarelto, but limited by anemia. Please restart if appropriate.  3.  Anemia - Hb low, requiring transfusions. Hb 9.0 on discharge. Labs drawn prior to discharge suggestive of iron deficiency anemia. Patient had a negative FOBT, but could be false negative. Could consider further evaluation with colonoscopy.  4.  Continuing chronic cefuroxime and doxycycline per ID note 4/2 for persistent infection of vascular bypass graft. Ensure f/u with ID (next appointment in August).  5.  Labs / imaging needed at time of follow-up: CBC, BMP  6.  Pending labs/ test needing follow-up: Haptoglobin  Follow-up Appointments: Follow-up Information    Hedgecock, [place]innie [place], [place]. Go in 1 week.   Specialty:  Physician Assistant Why:  Patient will call for appointment Contact information: [address] [place] [address] [facility] Course by problem list: Active Problems:   Chronic obstructive pulmonary disease (HCC)   PAD (peripheral artery disease) ([place])   Acute on chronic combined systolic and diastolic heart failure (HCC)   Acute on chronic respiratory failure with hypoxia ([place])   Acute on chronic HFrEF (heart failure with reduced ejection fraction) ([place])   1. Acute on chronic heart failure exacerbation  Acute on chronic respiratory failure with hypoxia Patient admitted with acute onset of shortness of breath after lying flat, elevated BNP, and CXR consistent with interstitial edema. He was reportedly hypoxic to the 70s en route. Symptoms and O2 saturation improved with BiPAP and IV lasix. PCT was negative. He was previously on lasix 20mg  as needed for his heart failure, however this had been discontinued on a prior admission. He was able to be transitioned to Time and then to room air and his symptoms continued to improve with gentle diuresis. TTE was repeated  while inpatient, which showed LVEF 30-35% and akinesis of anterolateral, inferolateral, and inferior myocardium, as well as left atrial enlargement. He was discharged with lasix 20mg  daily and instructed to follow up with his PCP for further titration of his diuretic dose.  2. 3 vessel obstructive CAD, s/p multiple stents (most recent in 2007) Spiro in 03/2017 showed 3 vessel obstructive CAD. EF at that time was estimated to be 25-35% (TTE 40-45% at that time). Plan at that time was to pursue aggressive medical therapy given that patient denied anginal symptoms. Patient denied chest pain during this admission. Troponins were trended and peaked at 0.03. EKG without acute ischemic changes. Cardiology was consulted with the question of whether he might benefit from PCI given his reduced EF. They felt the most appropriate approach weighing the risks and benefits in this gentleman was to continue to pursue medical management. Patient and daughter agreeable to this plan.  3. Atrial fibrillation CHADS2-VASc at least 4, suggesting 6.7% annual risk of stroke. Rate controlled on  home amiodarone. Xarelto has been on hold since previous admission due to anemia. Continued to hold Xarelto on discharge until follow up with PCP, at which time could consider restarting depending on anemia.  4. Iron Deficiency Anemia Patient is asymptomatic. He did receive a transfusion for Hb <7.1 to maintain hemoglobin >8 given CAD. FOBT was ordered, which was negative. Further work-up of anemia revealed labs suggestive of iron deficiency anemia. Could consider further GI work-up as FOBT could be false negative. Hb on discharge was 9.0.  5. Chronic respiratory failure 2/2 COPD He uses intermittent oxygen at home as needed. Presentation felt to be more consistent with volume overload rather than COPD exacerbation (although he did receive initial dose of solumedrol, Mg, and breathing treatments). Steroids were not continued. Discharged on  home inhaler regimen.  6. AAA, s/p open aortobifemoral [id number] Complicated bypersistentinfection of vascular bypass graft, Now s/p bilateralaortobifemoral limb removaland bilateral axillary to superficial femoral artery bypass He wasadmitted in January 2019 and treated for infected aortobifemoral graft, s/pbilaterallimb removal. Unclear source, but he was admitted for Staph hominis bacteremia in 03/2017, which could have led to seeding of his graft.He also underwent bilateral axillary to superficial femoral artery bypass. Hedeclinedremoval of the main body of the ABF graft.Operative Gram stain showedno organisms.Right groin cultures were negative.Diphtheroids grew from his left groin cultures.He received empiric IV vancomycin and ceftazidineduring his hospitalizationper ID recs anddid not want a PICC line for IV antibiotics. He was thus dischargedon empiric doxycycline and cefuroxime per ID recs.He remains on chronic suppressive therapy per most recent note in 4/2. Tagged WBC scan in 11/2017 was negative.Daughter and patient were told that he was only supposed to be on cefuroxime and that his last dose was 5/13, however there is no documentation of this in our chart. After discussion with ID on call, his home cefuroxime and doxycyline were continued and he was encouraged to call the ID office to clarify this regimen. Patient has follow up with them in August.  Discharge Vitals:   BP 94/72   Pulse 83   Temp 98.2 F (36.8 C) (Oral)   Resp 18   Ht 5\' 6"  (1.676 m)   Wt 109 lb 9.6 oz (49.7 kg)   SpO2 98%   BMI 17.69 kg/m   Pertinent Labs, Studies, and Procedures:  CBC Latest Ref Rng & Units 01/21/2018 01/20/2018 01/20/2018  WBC 4.0 - 10.5 K/uL 8.6 - 7.8  Hemoglobin 13.0 - 17.0 g/dL 9.0(L) 9.0(L) 7.1(L)  Hematocrit 39.0 - 52.0 % 31.4(L) 31.1(L) 24.7(L)  Platelets 150 - 400 K/uL 297 - 275   CMP Latest Ref Rng & Units 01/20/2018 01/19/2018 01/18/2018  Glucose 65 - 99 mg/dL  102(H) 149(H) 360(H)  BUN 6 - 20 mg/dL 28(H) 22(H) 22(H)  Creatinine 0.61 - 1.24 mg/dL 0.96 0.95 1.11  Sodium 135 - 145 mmol/L 136 139 136  Potassium 3.5 - 5.1 mmol/L 3.7 4.2 4.6  Chloride 101 - 111 mmol/L 100(L) 104 105  CO2 22 - 32 mmol/L 27 25 22   Calcium 8.9 - 10.3 mg/dL 8.6(L) 8.8(L) 8.6(L)  Total Protein 6.5 - 8.1 g/dL - - -  Total Bilirubin 0.3 - 1.2 mg/dL - - -  Alkaline Phos 38 - 126 U/L - - -  AST 15 - 41 U/L - - -  ALT 17 - 63 U/L - - -   Troponin 0.03 -> 0.03 UDS negative PCT <0.1 BNP 684 Mg 3.7 INR 1.03 Retic ct 1.0% Iron 25, TIBC 486, sat ratio 5,  UIBC 461 Ferritin 25 LDH 178 FOBT negative Hemoglobin A1c 5.7  CXR 01/18/2018 Vascular congestion noted. Increased interstitial markings raise concern for pulmonary edema.  TTE 01/20/2018 - Left ventricle: The cavity size was mildly dilated. Systolic   function was moderately to severely reduced. The estimated   ejection fraction was in the range of 30% to 35%. There is   akinesis of the entirelateral, inferolateral, and inferior   myocardium. The study is not technically sufficient to allow   evaluation of LV diastolic function. - Aorta: Aortic root dimension: 40 mm (ED). Ascending aorta   diameter: 38 mm (ED). - Aortic root: The aortic root was mildly dilated. - Ascending aorta: The ascending aorta was mildly dilated. - Mitral valve: There was mild regurgitation. - Left atrium: The atrium was severely dilated.  Discharge Instructions: Discharge Instructions    Diet - low sodium heart healthy   Complete by:  As directed    Discharge instructions   Complete by:  As directed    Mr. Nasca,  It was good to see you again.  While you were in the hospital, we treated you for having too much fluid in your lungs, which caused you to have trouble breathing. - Please start taking lasix 20mg  once a day to help keep the fluid off of you.  We also are trying to figure out why your blood counts are low. We got some  bloodwork right before you left the hospital, which can give [place] some more information to try to figure this out. I will include this in my note to your primary doctor so that she can continue to follow up on figuring this out. - Until you see your primary doctor, please DO NOT take xarelto. Once you see her, she will be able to help decide when to restart taking the xarelto.  For the infection from the surgeries you had, you are supposed to be on antibiotics. - Please take cefuroxime 500mg  twice a day as well as doxycycline 100mg  twice a day. Stay on these medicines until you see the Dr. Megan Salon again in August. If you need refills of the medicine, please call their office.  Please make sure you follow up with your primary doctor.   Increase activity slowly   Complete by:  As directed      Signed: Colbert Ewing, MD 01/21/2018, 5:50 PM   Pager: Mamie Nick [phone]

## 2018-01-19 NOTE — Progress Notes (Signed)
Subjective:  [name] was lying in bed comfortably this morning. He reports his dyspnea is significantly improved. He denies chest pain or pressure. He continues to endorse a dry cough, however states that his previous symptoms from the pneumonia are improved. Was able to complete previous 5 day antibiotic course. Returned to see him regarding possibility of intervention for his obstructive CAD per cath in 03/2017. Patient would be interested in talking to cardiology today. Also provided update to daughter.  Objective:  Vital signs in last 24 hours: Vitals:   01/19/18 0400 01/19/18 0500 01/19/18 0600 01/19/18 0630  BP: 116/61 124/70 121/71 126/72  Pulse: 78 84 79 82  Resp: 16 20 17 16   SpO2: 98% 93% 99% 99%  Weight:      Height:       GEN: Chronically ill appearing man lying in bed in NAD CV: Irregular rhythm, difficult to auscultate, JVP ~13cm H2O PULM: Bibasilar crackles, no wheezing on exam. No increased work of breathing or accessory muscle use, on [place] EXT: No LE edema  Assessment/Plan:  Active Problems:   Chronic obstructive pulmonary disease (HCC)   PAD (peripheral artery disease) (HCC)   Acute on chronic combined systolic and diastolic heart failure (HCC)   Acute on chronic respiratory failure with hypoxia (HCC)  Acute on chronic respiratory failure Acute HFrEF exacerbation (TTE 02-72% with diastolic dysfunction, LHC 03/2017 with EF 25-35%) Required BiPAP initially, but has now improved after IV lasix. Continues to seem volume overloaded with crackles and JVP, will continue IV diuresis. Do not suspect pneumonia at this point (procal <0.1) and with rapid improvement in his symptoms, suspect this is less likely COPD exacerbation. HF exacerbation likely triggered by increased salt load with diet and not currently on home diuretic, however he does also have obstructive CAD, and the concern that this is contributing to his reduced EF. Will manage as below.  - Continue  supplemental oxygen as needed - Duoneb q6h, albuterol nebulizer q2h PRN - IV lasix 20mg  x1 - BMP in AM  3 vessel obstructive CAD, s/p multiple stents (most recent in 2007) Imdur discontinued on last discharge due to no anginal symptoms and soft BP. Patient denies chest pain or pressure on presentation. Initial troponin negative, but second troponin 0.03. EKG with new T wave inversion in ?V5 and V6. Patient has known 3 vessel obstructive disease seen on LHC in 03/2017 - plan at that time was to pursue aggressive medical therapy, no intervention done at that time. Patient has a very complex medical history and may not be a good candidate for intervention, however with reduced EF, I think he would benefit from discussion with Cardiology and at least some consideration for PCI if they think it may be helpful. Discussed with patient and patient's daughter and both are amenable to discussion with Cardiology today to evaluate his options. - Cardiology consulted; appreciate their assistance - Repeat EKG - Trend troponin  COPD On intermittent home O2 PRN, has been using more often per report. Received solumedrol prior to arrival, no wheezing on exam. Suspect his respiratory distress was driven more by fluid overload rather than COPD exacerbation. Holding on further steroids. - Duonebs q6h  Atrial Fibrillation Rate controlled, on home amiodarone. Xarelto being held for anemia noted on last admission. - Continue home amiodarone 200mg  daily  Anemia Required transfusion at last admission, no source of bleeding identified and no new signs/sx reported. FOBT unable to be completed during last admission. Hgb improved compared to discharge at  7.6, prior baseline 8-9. - CBC in AM - FOBT  HTN Normo to hypotensive on last admission. BP currently elevated, will hold anti-hypertensives and monitor with diuresis.  AAA s/p repair with bypass graft infection Admitted in Jan 2019 for management of infection of  AAA repair bypass graft with revision. He has since been on chronic suppressive oral abx after declining prolonged IV antibiotics via PICC line. He has most recently been on cefuroxime per ID recommendations. Most recent ID note from 4/2 suggests that he should continue cefuroxime and doxy until follow up in August. Daughter reports taking the last dose 5/13. She reports this was instructed by ID, though no documentation of this plan. Discussed with on-call ID who recommended continuing cefuroxime and doxy. - Cefuroxime 500mg  BID with meals - Doxycycline 100mg  BID  Dispo: Anticipated discharge in approximately 1-2 day(s).   Colbert Ewing, MD 01/19/2018, 6:52 AM Pager: Mamie Nick [phone]

## 2018-01-19 NOTE — Progress Notes (Signed)
  Echocardiogram 2D Echocardiogram has been attempted. Nurse asked to come back later.   Lilliauna Van G Rosemary Pentecost 01/19/2018, 10:12 AM

## 2018-01-19 NOTE — Progress Notes (Signed)
Patient reports has issue taking large pills-  Will take them crushed in apple sauce but the apple sauce with medication makes him sick.   Also patient report having issues sleeping- normally on a muscle relaxer to help sleep.   Paged MD  MD called back- gave muscle relaxer for bedtime and order some zofran IV if needed for nausea

## 2018-01-19 NOTE — Progress Notes (Signed)
Internal Medicine Attending  Date: 01/19/2018  Patient name: [name] Medical record number: [id number] Date of birth: November 20, 1945 Age: 72 y.o. Gender: male  I saw and evaluated the patient. I reviewed the resident's note by Dr. Ronalee Red and I agree with the resident's findings and plans as documented in her progress note.  Please see my H&P dated 01/19/2018 and attached to Dr. Marita Snellen H&P dated 01/18/2018 for the specifics of my evaluation, assessment, and plan from earlier in the day.

## 2018-01-20 ENCOUNTER — Ambulatory Visit: Payer: Medicare Other | Admitting: Vascular Surgery

## 2018-01-20 ENCOUNTER — Inpatient Hospital Stay (HOSPITAL_COMMUNITY): Payer: Medicare Other

## 2018-01-20 DIAGNOSIS — I5023 Acute on chronic systolic (congestive) heart failure: Secondary | ICD-10-CM

## 2018-01-20 DIAGNOSIS — I340 Nonrheumatic mitral (valve) insufficiency: Secondary | ICD-10-CM

## 2018-01-20 DIAGNOSIS — I259 Chronic ischemic heart disease, unspecified: Secondary | ICD-10-CM

## 2018-01-20 LAB — BASIC METABOLIC PANEL
ANION GAP: 9 (ref 5–15)
BUN: 28 mg/dL — ABNORMAL HIGH (ref 6–20)
CALCIUM: 8.6 mg/dL — AB (ref 8.9–10.3)
CO2: 27 mmol/L (ref 22–32)
Chloride: 100 mmol/L — ABNORMAL LOW (ref 101–111)
Creatinine, Ser: 0.96 mg/dL (ref 0.61–1.24)
GLUCOSE: 102 mg/dL — AB (ref 65–99)
Potassium: 3.7 mmol/L (ref 3.5–5.1)
Sodium: 136 mmol/L (ref 135–145)

## 2018-01-20 LAB — CBC
HCT: 24.7 % — ABNORMAL LOW (ref 39.0–52.0)
HEMOGLOBIN: 7.1 g/dL — AB (ref 13.0–17.0)
MCH: 23 pg — ABNORMAL LOW (ref 26.0–34.0)
MCHC: 28.7 g/dL — AB (ref 30.0–36.0)
MCV: 79.9 fL (ref 78.0–100.0)
Platelets: 275 10*3/uL (ref 150–400)
RBC: 3.09 MIL/uL — ABNORMAL LOW (ref 4.22–5.81)
RDW: 20.8 % — ABNORMAL HIGH (ref 11.5–15.5)
WBC: 7.8 10*3/uL (ref 4.0–10.5)

## 2018-01-20 LAB — ECHOCARDIOGRAM COMPLETE
HEIGHTINCHES: 66 in
Weight: 1753.1 oz

## 2018-01-20 LAB — PREPARE RBC (CROSSMATCH)

## 2018-01-20 LAB — HEMOGLOBIN AND HEMATOCRIT, BLOOD
HEMATOCRIT: 31.1 % — AB (ref 39.0–52.0)
HEMOGLOBIN: 9 g/dL — AB (ref 13.0–17.0)

## 2018-01-20 LAB — HEMOGLOBIN A1C
Hgb A1c MFr Bld: 5.7 % — ABNORMAL HIGH (ref 4.8–5.6)
MEAN PLASMA GLUCOSE: 116.89 mg/dL

## 2018-01-20 LAB — OCCULT BLOOD X 1 CARD TO LAB, STOOL: Fecal Occult Bld: NEGATIVE

## 2018-01-20 MED ORDER — SODIUM CHLORIDE 0.9 % IV SOLN: Freq: Once | INTRAVENOUS | Status: DC

## 2018-01-20 MED ORDER — POLYETHYLENE GLYCOL 3350 17 G PO PACK
17.0000 g | PACK | Freq: Every day | ORAL | Status: DC
  Administered 2018-01-21: 17 g via ORAL
  Filled 2018-01-20 (×2): qty 1

## 2018-01-20 MED ORDER — FUROSEMIDE 10 MG/ML IJ SOLN
20.0000 mg | Freq: Once | INTRAMUSCULAR | Status: AC
  Administered 2018-01-20: 20 mg via INTRAVENOUS
  Filled 2018-01-20: qty 2

## 2018-01-20 MED ORDER — IPRATROPIUM-ALBUTEROL 0.5-2.5 (3) MG/3ML IN SOLN
3.0000 mL | Freq: Three times a day (TID) | RESPIRATORY_TRACT | Status: DC
  Administered 2018-01-20 – 2018-01-21 (×4): 3 mL via RESPIRATORY_TRACT
  Filled 2018-01-20 (×4): qty 3

## 2018-01-20 NOTE — Progress Notes (Signed)
   No new recs from consult note. Continue anemia workup. Transfusion. Would not pursue PCI/CABG - spoke with Dr. Ronalee Red Continue with med mgt.  With ongoing possible bleeding (Hg now 7.1), consider stopping ASA for now.   Please let us know if we can be of further assistance.   Candee Furbish, MD

## 2018-01-20 NOTE — Progress Notes (Signed)
Internal Medicine Attending  Date: 01/20/2018  Patient name: [name] Medical record number: [id number] Date of birth: Jan 01, 1946 Age: 72 y.o. Gender: male  I saw and evaluated the patient. I reviewed the resident's note by Dr. Ronalee Red and I agree with the resident's findings and plans as documented in her progress note.  When seen on rounds this morning [name] was without complaints. He stated his breathing was much improved and he was inquiring about going home. We still are without a source of his anemia. We performed a rectal exam to assess the stool for occult blood but were unable to get this processed because of the labeling issue. We are attempting to try to stimulate a bowel movement to get another stool sample. Ultimately, the decision will need to be made as to when to restart anticoagulation for his atrial fibrillation. If we are unable to get stool to assess for a GI source the evaluation will need to continued in the outpatient setting.  His echocardiogram revealed a slight decrease in his left ventricular systolic function from 5465. We appreciate cardiology's input and their opinion that no further intervention is necessary at this time with his underlying ischemic heart disease. He will be discharged home on a daily loop diuretic and further adjustments in his heart failure regimen can be made in the outpatient setting. Once he receives a unit of blood today with Lasix he may be ready for discharge home with the goal hemoglobin greater than 8 given his underlying coronary artery disease.

## 2018-01-20 NOTE — Progress Notes (Signed)
  Echocardiogram 2D Echocardiogram has been performed.  Jennette Dubin 01/20/2018, 11:41 AM

## 2018-01-20 NOTE — Progress Notes (Signed)
Subjective:  [name] was lying in bed comfortably this morning. He denies dyspnea, chest pressure, or lightheaded/dizziness. Discussed plan to talk to Cardiology today regarding utility of PCI. Also will plan to give him a unit of blood for Hb <8 given hx of CAD. Possible discharge later today.  Objective:  Vital signs in last 24 hours: Vitals:   01/19/18 1911 01/19/18 2012 01/20/18 0046 01/20/18 0557  BP: 130/72  (!) 93/47 111/62  Pulse: 90 89 76 75  Resp: 16 16 16 18   Temp: 98.8 F (37.1 C)  98.6 F (37 C) 98 F (36.7 C)  TempSrc: Oral  Oral Oral  SpO2: 98% 99% 100% 100%  Weight:    109 lb 9.1 oz (49.7 kg)  Height:       GEN: Chronically ill appearing man lying in bed in NAD CV: Irregular rhythm, difficult to auscultate PULM: No crackles, no wheezing on exam. No increased work of breathing or accessory muscle use EXT: No LE edema  Assessment/Plan:  Active Problems:   Chronic obstructive pulmonary disease (HCC)   PAD (peripheral artery disease) (HCC)   Acute on chronic combined systolic and diastolic heart failure (HCC)   Acute on chronic respiratory failure with hypoxia (HCC)   Acute on chronic HFrEF (heart failure with reduced ejection fraction) (HCC)  Acute on chronic respiratory failure Acute HFrEF exacerbation (TTE 76-54% with diastolic dysfunction, LHC 03/2017 with EF 25-35%) Improving after 2 days of IV diuresis. HF exacerbation likely triggered by increased salt load with diet and not currently on home diuretic, however he does also have obstructive CAD, and the concern that this is contributing to his reduced EF. Will manage as below. - Continue supplemental oxygen as needed - Duoneb q6h, albuterol nebulizer q2h PRN - D/c with lasix 20mg  daily, per Cards recs - BMP in AM - Possible d/c today, pending blood transfusion  3 vessel obstructive CAD, s/p multiple stents (most recent in 2007) Imdur discontinued on last discharge due to no anginal symptoms and  soft BP. Patient denies chest pain or pressure on presentation. Troponins flat at 0.03. EKG with new T wave inversion in ?V5 and V6. Patient has known 3 vessel obstructive disease seen on LHC in 03/2017 - plan at that time was to pursue aggressive medical therapy, no intervention done at that time. Patient has a very complex medical history and may not be a good candidate for intervention, however with reduced EF, I think he would benefit from discussion with Cardiology and at least some consideration for PCI if they think it may be helpful. Discussed with patient and patient's daughter and both are amenable to discussion with Cardiology today to evaluate his options. Will touch base with Cardiology today. - Cardiology consulted; appreciate their assistance - Repeat EKG  Chronic respiratory failure 2/2 COPD On intermittent home O2 PRN, has been using more often per report. Received solumedrol prior to arrival, no wheezing on exam. Suspect his respiratory distress was driven more by fluid overload rather than COPD exacerbation. Holding on further steroids. - Duonebs q6h  Atrial Fibrillation Rate controlled, on home amiodarone. Xarelto being held for anemia noted on last admission. - Continue home amiodarone 200mg  daily  Anemia No source of bleeding identified and no new signs/sx reported. Rectal exam and FOBT collection was performed today, however unfortunately this was mislabeled and thus unable to be completed. Will attempt to stimulate BM or repeat collection depending on patient's desire. Hgb 7.1 this AM, prior baseline 8-9. Will transfuse  1u pRBC (with lasix to avoid circulatory overload) for Hb>8 in setting of CAD - CBC in AM - FOBT - Transfuse 1u pRBC over 4 hours (With 20 IV lasix) - F/u post transfusion H&H  HTN Normo to hypotensive on last admission. 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 He has since been on chronic suppressive oral abx after declining prolonged IV antibiotics via PICC line. He has most recently been on cefuroxime per ID recommendations. Most recent ID note from 4/2 suggests that he should continue cefuroxime and doxy until follow up in August. Daughter reports taking the last dose 5/13. She reports this was instructed by ID, though no documentation of this plan. Discussed with on-call ID who recommended continuing cefuroxime and doxy. - Cefuroxime 500mg  BID with meals - Doxycycline 100mg  BID  Dispo: Anticipated discharge in approximately 0-1 day(s).   Colbert Ewing, MD 01/20/2018, 6:58 AM Pager: Mamie Nick [phone]

## 2018-01-20 NOTE — Evaluation (Signed)
Physical Therapy Evaluation and Discharge Patient Details Name: [name] MRN: [id number] DOB: 23-Jul-1946 Today's Date: 01/20/2018   History of Present Illness  [name] is a 72 year old man with a history of coronary artery disease complicated by combined systolic and diastolic heart failure, chronic obstructive pulmonary disease, peripheral vascular occlusive disease and an infected vascular bypass graft who was recently admitted to the hospital for community-acquired pneumonia.  Clinical Impression  Pt functioning at baseline. Pt able to amb 300' without AD and transfer independently. Pt with no further acute PT needs at this time. PT SIGNING OFF. Please re-consult if needed in future.    Follow Up Recommendations No PT follow up    Equipment Recommendations  None recommended by PT    Recommendations for Other Services       Precautions / Restrictions Precautions Precautions: Fall Restrictions Weight Bearing Restrictions: No      Mobility  Bed Mobility Overal bed mobility: Independent             General bed mobility comments: no difficulty  Transfers Overall transfer level: Independent Equipment used: None Transfers: Sit to/from Stand Sit to Stand: Independent         General transfer comment: not difficulty  Ambulation/Gait Ambulation/Gait assistance: Independent(PT pulled O2 tank) Ambulation Distance (Feet): 300 Feet Assistive device: None Gait Pattern/deviations: WFL(Within Functional Limits) Gait velocity: mildly decreased however expect due patients cardiopulmonary medical history Gait velocity interpretation: 1.31 - 2.62 ft/sec, indicative of limited community ambulator General Gait Details: no episodes of LOB, no reports of SOB  Stairs            Wheelchair Mobility    Modified Rankin (Stroke Patients Only)       Balance Overall balance assessment: Modified Independent(pt able to stand at sink and comb hair without  difficulty)                                           Pertinent Vitals/Pain Pain Assessment: No/denies pain    Home Living Family/patient expects to be discharged to:: Private residence Living Arrangements: Children Available Help at Discharge: Family;Available PRN/intermittently Type of Home: Mobile home Home Access: Stairs to enter   Entrance Stairs-Number of Steps: 1 Home Layout: One level Home Equipment: Shower seat Additional Comments: reports he has access to RW, hospital bed, w/c    Prior Function Level of Independence: Independent         Comments: pt doesn't use AD for amb, daughter manages meds, son does the housework and cooking. pt indep with dressing and bathing     Hand Dominance   Dominant Hand: Right    Extremity/Trunk Assessment   Upper Extremity Assessment Upper Extremity Assessment: Generalized weakness    Lower Extremity Assessment Lower Extremity Assessment: Generalized weakness    Cervical / Trunk Assessment Cervical / Trunk Assessment: Kyphotic  Communication   Communication: No difficulties  Cognition Arousal/Alertness: Awake/alert Behavior During Therapy: WFL for tasks assessed/performed Overall Cognitive Status: Within Functional Limits for tasks assessed                                 General Comments: pt states he has trouble with his memory      General Comments      Exercises     Assessment/Plan    PT Assessment Patent  does not need any further PT services  PT Problem List         PT Treatment Interventions      PT Goals (Current goals can be found in the Care Plan section)       Frequency     Barriers to discharge        Co-evaluation               AM-PAC PT "6 Clicks" Daily Activity  Outcome Measure Difficulty turning over in bed (including adjusting bedclothes, sheets and blankets)?: None Difficulty moving from lying on back to sitting on the side of the bed? :  None Difficulty sitting down on and standing up from a chair with arms (e.g., wheelchair, bedside commode, etc,.)?: None Help needed moving to and from a bed to chair (including a wheelchair)?: None Help needed walking in hospital room?: None Help needed climbing 3-5 steps with a railing? : None 6 Click Score: 24    End of Session Equipment Utilized During Treatment: Oxygen(2Lo2 via [place]) Activity Tolerance: Patient tolerated treatment well Patient left: in bed;with call bell/phone within reach(sitting EOB) Nurse Communication: Mobility status PT Visit Diagnosis: Difficulty in walking, not elsewhere classified (R26.2)    Time: 7035-0093 PT Time Calculation (min) (ACUTE ONLY): 23 min   Charges:   PT Evaluation $PT Eval Low Complexity: 1 Low     PT G CodesKittie Plater, PT, DPT Pager #: [phone] Office #: [phone]   [place] 01/20/2018, 1:16 PM

## 2018-01-21 DIAGNOSIS — Z7982 Long term (current) use of aspirin: Secondary | ICD-10-CM

## 2018-01-21 DIAGNOSIS — T827XXD Infection and inflammatory reaction due to other cardiac and vascular devices, implants and grafts, subsequent encounter: Secondary | ICD-10-CM

## 2018-01-21 DIAGNOSIS — Z720 Tobacco use: Secondary | ICD-10-CM

## 2018-01-21 DIAGNOSIS — D509 Iron deficiency anemia, unspecified: Secondary | ICD-10-CM

## 2018-01-21 DIAGNOSIS — Z7901 Long term (current) use of anticoagulants: Secondary | ICD-10-CM

## 2018-01-21 LAB — IRON AND TIBC
Iron: 25 ug/dL — ABNORMAL LOW (ref 45–182)
SATURATION RATIOS: 5 % — AB (ref 17.9–39.5)
TIBC: 486 ug/dL — ABNORMAL HIGH (ref 250–450)
UIBC: 461 ug/dL

## 2018-01-21 LAB — RETICULOCYTES
RBC.: 4.25 MIL/uL (ref 4.22–5.81)
RETIC COUNT ABSOLUTE: 42.5 10*3/uL (ref 19.0–186.0)
RETIC CT PCT: 1 % (ref 0.4–3.1)

## 2018-01-21 LAB — TYPE AND SCREEN
ABO/RH(D): O POS
ANTIBODY SCREEN: NEGATIVE
UNIT DIVISION: 0

## 2018-01-21 LAB — BPAM RBC
Blood Product Expiration Date: 201905222359
ISSUE DATE / TIME: 201905151259
Unit Type and Rh: 9500

## 2018-01-21 LAB — CBC
HCT: 31.4 % — ABNORMAL LOW (ref 39.0–52.0)
Hemoglobin: 9 g/dL — ABNORMAL LOW (ref 13.0–17.0)
MCH: 22.7 pg — AB (ref 26.0–34.0)
MCHC: 28.7 g/dL — AB (ref 30.0–36.0)
MCV: 79.1 fL (ref 78.0–100.0)
PLATELETS: 297 10*3/uL (ref 150–400)
RBC: 3.97 MIL/uL — AB (ref 4.22–5.81)
RDW: 19.8 % — ABNORMAL HIGH (ref 11.5–15.5)
WBC: 8.6 10*3/uL (ref 4.0–10.5)

## 2018-01-21 LAB — LACTATE DEHYDROGENASE: LDH: 178 U/L (ref 98–192)

## 2018-01-21 LAB — FERRITIN: FERRITIN: 25 ng/mL (ref 24–336)

## 2018-01-21 MED ORDER — DOXYCYCLINE HYCLATE 100 MG PO TABS: 100.0000 mg | ORAL_TABLET | Freq: Two times a day (BID) | ORAL | 0 refills | Status: DC

## 2018-01-21 MED ORDER — FOLIC ACID 1 MG PO TABS: 1.0000 mg | ORAL_TABLET | Freq: Every day | ORAL | 0 refills | Status: DC

## 2018-01-21 MED ORDER — FUROSEMIDE 20 MG PO TABS: 20.0000 mg | ORAL_TABLET | Freq: Every day | ORAL | 0 refills | Status: DC

## 2018-01-21 MED ORDER — CEFUROXIME AXETIL 500 MG PO TABS: 500.0000 mg | ORAL_TABLET | Freq: Two times a day (BID) | ORAL | 0 refills | Status: DC

## 2018-01-21 NOTE — Progress Notes (Signed)
   Subjective:  Mr. Foots was sitting up in bed this morning. He denies dyspnea, chest pressure, or lightheadedness. He is very eager to go home. Discussed plan for a lab draw this morning and will discharge later today with f/u with PCP.  Objective:  Vital signs in last 24 hours: Vitals:   01/20/18 1559 01/20/18 2004 01/20/18 2053 01/20/18 2300  BP: 119/79  (!) 112/57 111/60  Pulse: 86 85 90 88  Resp: 18 18 18 18   Temp: 98.8 F (37.1 C)  98.8 F (37.1 C) 98.5 F (36.9 C)  TempSrc: Oral  Oral Oral  SpO2: 96% 95% 95% 96%  Weight:      Height:       GEN: Chronically ill appearing man lying in bed in NAD CV: Irregular rhythm, difficult to auscultate PULM: No crackles, no wheezing on exam. No increased work of breathing or accessory muscle use EXT: No LE edema  Assessment/Plan:  Active Problems:   Chronic obstructive pulmonary disease (HCC)   PAD (peripheral artery disease) (HCC)   Acute on chronic combined systolic and diastolic heart failure (HCC)   Acute on chronic respiratory failure with hypoxia (HCC)   Acute on chronic HFrEF (heart failure with reduced ejection fraction) ([place])  Mr. Elks's respiratory status continues to remain improved after several doses of IV lasix. He will be transitioned to PO lasix on discharge, per Cardiology recommendations. The etiology for his anemia is still unclear - FOBT performed yesterday was negative, will check bloodwork today and have patient f/u with PCP for further evaluation. His Hb did improve appropriately with 1u pRBC transfusion yesterday, and he is stable for discharge today with continued outpatient follow-up.  Acute on chronic respiratory failure, improved with diuresis Acute HFrEF exacerbation (TTE 65-46% with diastolic dysfunction, LHC 03/2017 with EF 25-35%) Likely triggered by increased salt load with diet and not being on home diuretic. No issues of volume overload with pRBC transfusion yesterday, pre-treated with lasix.  Stable for discharge. - Continue supplemental oxygen as needed - D/c with lasix 20mg  daily, per Cards recs - D/c today  3 vessel obstructive CAD, s/p multiple stents (most recent in 2007) Discussed with Cardiology, who recommend continued medical management as PCI or other intervention presents more harm than benefit at this point. Patient agreeable to this plan.  Chronic respiratory failure 2/2 COPD Respiratory status at baseline. - Duonebs q6h  Atrial Fibrillation Rate controlled, on home amiodarone. Xarelto being held for anemia noted on last admission. - Continue home amiodarone 200mg  daily - Will continue holding Xarelto on discharge, as etiology of patient's anemia is unclear - Encouraged patient to schedule f/u with PCP regarding when to re-initiate Xarelto  Anemia Unclear etiology. FOBT performed yesterday was negative. Hb did improve to 9.0 after 1u pRBC transfusion on 5/15 to maintain Hb>8 in setting of CAD - Check LDH, ferritin, haptoglobin, retic count, iron - F/u with PCP  HTN Normo to hypotensive on last admission. 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 He has since been on chronic suppressive oral abx after declining prolonged IV antibiotics via PICC line. Discussed with on-call ID who recommended continuing cefuroxime and doxy. - Cefuroxime 500mg  BID with meals - Doxycycline 100mg  BID  Dispo: Anticipated discharge today.  Colbert Ewing, MD 01/21/2018, 6:10 AM Pager: Mamie Nick [phone]

## 2018-01-21 NOTE — Progress Notes (Signed)
Reviewed and educated pt on d/c medications, Rx, follow-up appointment, when to call the MD. Answered all questions fully. Pt has all belongings, paperwork and Rx.  IV site and telemetry removed. D/C via wheelchair with family.  Pola Corn, RN

## 2018-01-22 LAB — HAPTOGLOBIN: Haptoglobin: 163 mg/dL (ref 34–200)

## 2018-01-27 DIAGNOSIS — I739 Peripheral vascular disease, unspecified: Secondary | ICD-10-CM | POA: Diagnosis not present

## 2018-01-27 DIAGNOSIS — I2510 Atherosclerotic heart disease of native coronary artery without angina pectoris: Secondary | ICD-10-CM | POA: Diagnosis not present

## 2018-01-27 DIAGNOSIS — I255 Ischemic cardiomyopathy: Secondary | ICD-10-CM | POA: Diagnosis not present

## 2018-01-27 DIAGNOSIS — G8921 Chronic pain due to trauma: Secondary | ICD-10-CM | POA: Diagnosis not present

## 2018-01-27 DIAGNOSIS — R1310 Dysphagia, unspecified: Secondary | ICD-10-CM | POA: Diagnosis not present

## 2018-01-27 DIAGNOSIS — J449 Chronic obstructive pulmonary disease, unspecified: Secondary | ICD-10-CM | POA: Diagnosis not present

## 2018-01-27 DIAGNOSIS — E782 Mixed hyperlipidemia: Secondary | ICD-10-CM | POA: Diagnosis not present

## 2018-01-27 DIAGNOSIS — I481 Persistent atrial fibrillation: Secondary | ICD-10-CM | POA: Diagnosis not present

## 2018-01-27 DIAGNOSIS — I5020 Unspecified systolic (congestive) heart failure: Secondary | ICD-10-CM | POA: Diagnosis not present

## 2018-01-27 DIAGNOSIS — J181 Lobar pneumonia, unspecified organism: Secondary | ICD-10-CM | POA: Diagnosis not present

## 2018-01-27 DIAGNOSIS — F17200 Nicotine dependence, unspecified, uncomplicated: Secondary | ICD-10-CM | POA: Diagnosis not present

## 2018-01-28 ENCOUNTER — Telehealth: Payer: Self-pay | Admitting: Internal Medicine

## 2018-01-28 NOTE — Telephone Encounter (Signed)
Pt Recently seen on inpt 01/18/2018.  Pt's daughter has some questions in regards to his Medications and Treatment and would like to speak to Dr. Ronalee Red or possibly been seen in our clinic instead of his PCP's office.

## 2018-01-29 ENCOUNTER — Telehealth: Payer: Self-pay | Admitting: Internal Medicine

## 2018-01-29 ENCOUNTER — Other Ambulatory Visit: Payer: Self-pay | Admitting: *Deleted

## 2018-01-29 NOTE — Telephone Encounter (Signed)
Called and spoke to patient's daughter. She stated that patient's PCP has been writing Rxs for nebulized medications and had been managing everything. PCP told patient that she no longer will be filling these and suggests that patient follow up with pulmonology. Patient's daughter stated that patient's respiratory status has declined and needs appointment. Scheduled patient with MW. Nothing further needed at this time.

## 2018-02-02 ENCOUNTER — Other Ambulatory Visit (INDEPENDENT_AMBULATORY_CARE_PROVIDER_SITE_OTHER): Payer: Medicare Other

## 2018-02-02 ENCOUNTER — Ambulatory Visit (INDEPENDENT_AMBULATORY_CARE_PROVIDER_SITE_OTHER)
Admission: RE | Admit: 2018-02-02 | Discharge: 2018-02-02 | Disposition: A | Discharge status: Home / Self Care | Payer: Medicare Other | Source: Ambulatory Visit | Attending: Internal Medicine | Admitting: Internal Medicine

## 2018-02-02 ENCOUNTER — Ambulatory Visit (INDEPENDENT_AMBULATORY_CARE_PROVIDER_SITE_OTHER): Payer: Medicare Other | Admitting: Internal Medicine

## 2018-02-02 ENCOUNTER — Encounter: Payer: Self-pay | Admitting: Internal Medicine

## 2018-02-02 DIAGNOSIS — R05 Cough: Secondary | ICD-10-CM | POA: Diagnosis not present

## 2018-02-02 DIAGNOSIS — F17210 Nicotine dependence, cigarettes, uncomplicated: Secondary | ICD-10-CM | POA: Diagnosis not present

## 2018-02-02 DIAGNOSIS — R0609 Other forms of dyspnea: Secondary | ICD-10-CM

## 2018-02-02 DIAGNOSIS — I6529 Occlusion and stenosis of unspecified carotid artery: Secondary | ICD-10-CM

## 2018-02-02 DIAGNOSIS — D509 Iron deficiency anemia, unspecified: Secondary | ICD-10-CM

## 2018-02-02 DIAGNOSIS — J449 Chronic obstructive pulmonary disease, unspecified: Secondary | ICD-10-CM | POA: Diagnosis not present

## 2018-02-02 LAB — BASIC METABOLIC PANEL
BUN: 28 mg/dL — ABNORMAL HIGH (ref 6–23)
CALCIUM: 9.6 mg/dL (ref 8.4–10.5)
CO2: 29 mEq/L (ref 19–32)
Chloride: 102 mEq/L (ref 96–112)
Creatinine, Ser: 0.8 mg/dL (ref 0.40–1.50)
GFR: 101.46 mL/min (ref >60.00)
Glucose, Bld: 94 mg/dL (ref 70–99)
Potassium: 4.1 mEq/L (ref 3.5–5.1)
SODIUM: 140 meq/L (ref 135–145)

## 2018-02-02 LAB — CBC WITH DIFFERENTIAL/PLATELET
Basophils Absolute: 0.1 10*3/uL (ref 0.0–0.1)
Basophils Relative: 0.9 % (ref 0.0–3.0)
EOS ABS: 0.1 10*3/uL (ref 0.0–0.7)
Eosinophils Relative: 1.5 % (ref 0.0–5.0)
HCT: 32 % — ABNORMAL LOW (ref 39.0–52.0)
HEMOGLOBIN: 9.8 g/dL — AB (ref 13.0–17.0)
LYMPHS ABS: 0.8 10*3/uL (ref 0.7–4.0)
Lymphocytes Relative: 11.2 % — ABNORMAL LOW (ref 12.0–46.0)
MCHC: 30.7 g/dL (ref 30.0–36.0)
MCV: 76.8 fl — ABNORMAL LOW (ref 78.0–100.0)
MONO ABS: 0.5 10*3/uL (ref 0.1–1.0)
Monocytes Relative: 6.1 % (ref 3.0–12.0)
NEUTROS ABS: 6.1 10*3/uL (ref 1.4–7.7)
NEUTROS PCT: 80.3 % — AB (ref 43.0–77.0)
PLATELETS: 290 10*3/uL (ref 150.0–400.0)
RBC: 4.16 Mil/uL — ABNORMAL LOW (ref 4.22–5.81)
RDW: 22.9 % — AB (ref 11.5–15.5)
WBC: 7.5 10*3/uL (ref 4.0–10.5)

## 2018-02-02 LAB — SEDIMENTATION RATE: Sed Rate: 85 mm/hr — ABNORMAL HIGH (ref 0–20)

## 2018-02-02 LAB — BRAIN NATRIURETIC PEPTIDE: PRO B NATRI PEPTIDE: 253 pg/mL — AB (ref 0.0–100.0)

## 2018-02-02 MED ORDER — BUDESONIDE-FORMOTEROL FUMARATE 160-4.5 MCG/ACT IN AERO: 2.0000 | INHALATION_SPRAY | Freq: Two times a day (BID) | RESPIRATORY_TRACT | 11 refills | Status: DC

## 2018-02-02 MED ORDER — ALBUTEROL SULFATE (2.5 MG/3ML) 0.083% IN NEBU: 2.5000 mg | INHALATION_SOLUTION | Freq: Four times a day (QID) | RESPIRATORY_TRACT | 2 refills | Status: AC | PRN

## 2018-02-02 NOTE — Patient Instructions (Addendum)
Work on inhaler technique:  relax and gently blow all the way out then take a nice smooth deep breath back in, triggering the inhaler at same time you start breathing in.  Hold for up to 5 seconds if you can. Blow out thru nose. Rinse and gargle with water when done  Plan A = Automatic = symbicort 160 Take 2 puffs first thing in am and then another 2 puffs about 12 hours later.     Plan B = Backup Only use your albuterol inhaler (ventolin) as a rescue medication to be used if you can't catch your breath by resting or doing a relaxed purse lip breathing pattern.  - The less you use it, the better it will work when you need it. - Ok to use the inhaler up to 2 puffs  every 4 hours if you must but call for appointment if use goes up over your usual need - Don't leave home without it !!  (think of it like the spare tire for your car)    Plan C = Crisis - only use your albuterol nebulizer if you first try Plan B and it fails to help > ok to use the nebulizer up to every 4 hours but if start needing it regularly call for immediate appointment        Please remember to go to the lab and x-ray department downstairs in the basement  for your tests - we will call you with the results when they are available.     See Tammy NP w/in 2 weeks with all your medications, even over the counter meds, separated in two separate bags, the ones you take no matter what vs the ones you stop once you feel better and take only as needed when you feel you need them.   Tammy  will generate for you a new user friendly medication calendar that will put [place] all on the same page re: your medication use.     Without this process, it simply isn't possible to assure that we are providing  your outpatient care  with  the attention to detail we feel you deserve.   If we cannot assure that you're getting that kind of care,  then we cannot manage your problem effectively from this clinic.  Once you have seen Tammy and we are sure  that we're all on the same page with your medication use she will arrange follow up with me with full pfts

## 2018-02-02 NOTE — Progress Notes (Signed)
Subjective:    Patient ID: [name], male    DOB: May 14, 1946,    MRN: [id number]    Brief patient profile:   63 yowm "cut way back on smoking" December 07 2016 with onset doe x 2015 on breo/ spiriva x 2017 referred to pulmonary clinic 01/01/2017 by Nunzio Cobbs Hegecock PA for copd and proved to have GOLD III critieria on initial pulmonary eval.   History of Present Illness  01/01/2017 1st [facility] office visit/ Felicitas Sine  Breo/ spiriva dpi  Chief Complaint  Patient presents with  . Pulmonary Consult    Referred by [facility] PA-C, Pt here today c/o increase sob with exertion, finds it hard to catch his breath when he does any fast walking,pt does cough alot with phlegm, slight wheezing,some chest tightness pt still currently smokes,     Just started spiriva dpi  But ? Albuterol helps the most / cough is most productive n ams but nothing purulent produced  Doe = MMRC3 = can't walk 100 yards even at a slow pace at a flat grade s stopping due to sob  - can do HT leaning on cart  rec The key is to stop smoking completely before smoking completely stops you!  Work on inhaler technique:  relax and gently blow all the way out then take a nice smooth deep breath back in, triggering the inhaler at same time you start breathing in.  Hold for up to 5 seconds if you can. Blow out thru nose. Rinse and gargle with water when done Plan A = Automatic = Symbicort 160 Take 2 puffs first thing in am and then another 2 puffs about 12 hours later.  Plan B = Backup Only use your albuterol as a rescue medication Plan C = Crisis - only use your albuterol nebulizer if you first try Plan B  Please schedule a follow up office visit in 4 weeks, sooner if needed  with all medications /inhalers/ solutions in hand so we can verify exactly what you are taking. This includes all medications from all doctors and over the counters Did not return as rec     Date of Admission: 01/18/2018 10:19 PM Date of  Discharge: 5/16/20195/16/2019 Attending Physician: No att. providers found  Discharge Diagnosis: 1. Acute on chronic heart failure exacerbation 2. Afib 3. 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 Anemia 6. Obstructive CAD  Active Problems:   Chronic obstructive pulmonary disease (HCC)   PAD (peripheral artery disease) (HCC)   Acute on chronic combined systolic and diastolic heart failure (HCC)   Acute on chronic respiratory failure with hypoxia (HCC)   Acute on chronic HFrEF (heart failure with reduced ejection fraction) ([place])     02/02/2018  transition of care  ov/Charlesia Canaday re: re-esatablish re copd/  Post hosp f/u ov / still actively smoking Chief Complaint  Patient presents with  . Acute Visit    pt states PCP will no longer refill his neb solution. He states his breathing has been worse over the past 2 months. He gets out of breath walking to the mailbox. He is using his albuterol inhaler 2 x daily neb with albuterol once daily.   Dyspnea:  Happens at rest s warning but never wakes from sound sleep Cough: rattles a bit  Sleep: ok flat p tylenol pm and muscle spasms not resp rx   SABA use:  At least bid with very poor hfa   Says gets quite  a  Bit better during admits then worse w/in a week but so far since last admit not losing any ground "yet"   No obvious day to day or daytime variability or assoc excess/ purulent sputum or mucus plugs or hemoptysis or cp or chest tightness, subjective wheeze or overt sinus or hb symptoms. No unusual exposure hx or h/o childhood pna/ asthma or knowledge of premature birth.  Sleeping  Ok flat p tyl pm  without nocturnal  or early am exacerbation  of respiratory  c/o's or need for noct saba. Also denies any obvious fluctuation of symptoms with weather or environmental changes or other aggravating or alleviating factors except as outlined above   Current Allergies, Complete Past Medical History, Past  Surgical History, Family History, and Social History were reviewed in [employer] record.  ROS  The following are not active complaints unless bolded Hoarseness, sore throat, dysphagia, dental problems, itching, sneezing,  nasal congestion or discharge of excess mucus or purulent secretions, ear ache,   fever, chills, sweats, unintended wt loss or wt gain, classically pleuritic or exertional cp,  orthopnea pnd or arm/hand swelling  or leg swelling, presyncope, palpitations, abdominal pain, anorexia, nausea, vomiting, diarrhea  or change in bowel habits or change in bladder habits, change in stools or change in urine, dysuria, hematuria,  rash, arthralgias, visual complaints, headache, numbness, weakness or ataxia or problems with walking or coordination,  change in mood or  memory.        Current Meds  Medication Sig  . albuterol (PROVENTIL HFA;VENTOLIN HFA) 108 (90 Base) MCG/ACT inhaler Inhale 1 puff into the lungs every 6 (six) hours as needed for wheezing or shortness of breath.  [name] albuterol (PROVENTIL) (2.5 MG/3ML) 0.083% nebulizer solution Take 3 mLs (2.5 mg total) by nebulization every 6 (six) hours as needed for wheezing or shortness of breath.  [name] amiodarone (PACERONE) 200 MG tablet TAKE 1 TABLET BY MOUTH ONCE DAILY  . aspirin EC 81 MG tablet Take 81 mg by mouth daily.  [name] atorvastatin (LIPITOR) 80 MG tablet Take 1 tablet (80 mg total) by mouth daily at 6 PM.  . budesonide-formoterol (SYMBICORT) 160-4.5 MCG/ACT inhaler Inhale 2 puffs into the lungs 2 (two) times daily.  . cefUROXime (CEFTIN) 500 MG tablet Take 1 tablet (500 mg total) by mouth 2 (two) times daily with a meal.  . doxycycline (VIBRA-TABS) 100 MG tablet Take 1 tablet (100 mg total) by mouth every 12 (twelve) hours.  . folic acid (FOLVITE) 1 MG tablet Take 1 tablet (1 mg total) by mouth daily.  . furosemide (LASIX) 20 MG tablet Take 1 tablet (20 mg total) by mouth daily.  . mirtazapine (REMERON) 45 MG tablet  Take 45 mg by mouth at bedtime.   . nitroGLYCERIN (NITROSTAT) 0.4 MG SL tablet Place 0.4 mg under the tongue every 5 (five) minutes as needed for chest pain.   . Polyethylene Glycol 3350 (MIRALAX PO) Take by mouth daily.  [name] thiamine 100 MG tablet Take 1 tablet (100 mg total) by mouth daily.  [name] tiZANidine (ZANAFLEX) 4 MG tablet Take 4 mg by mouth at bedtime. At bedtime  .     [name]                         Objective:   Physical Exam  Edentulous wm nad    02/02/2018       114   01/01/17 133 lb (60.3 kg)  03/04/16  120 lb 9.6 oz (54.7 kg)  02/14/16 123 lb (55.8 kg)      Vital signs reviewed - Note on arrival 02 sats  96% on RA     HEENT: nl dentition, turbinates bilaterally, and oropharynx. Nl external ear canals without cough reflex   NECK :  without JVD/Nodes/TM/ nl carotid upstrokes bilaterally   LUNGS: no acc muscle use,  Nl contour chest junky insp/exp rhonchi bilaterally without cough on insp or exp maneuvers   CV:  IRIR   no s3 or murmur or increase in P2, and no edema   ABD:  soft and nontender with nl inspiratory excursion in the supine position. No bruits or organomegaly appreciated, bowel sounds nl  MS:  Nl gait/ ext warm without deformities, calf tenderness, cyanosis or clubbing No obvious joint restrictions   SKIN: warm and dry without lesions    NEURO:  alert, approp, nl sensorium with  no motor or cerebellar deficits apparent.          CXR PA and Lateral:   02/02/2018 :    I personally reviewed images and agree with radiology impression as follows:    COPD. Markedly tortuous aorta.  No visible aneurysm. No active disease.    Labs ordered/ reviewed:      Chemistry      Component Value Date/Time   NA 140 02/02/2018 1007   NA 141 05/08/2017 0907   K 4.1 02/02/2018 1007   CL 102 02/02/2018 1007   CO2 29 02/02/2018 1007   BUN 28 (H) 02/02/2018 1007   BUN 16 05/08/2017 0907   CREATININE 0.80 02/02/2018 1007   CREATININE 0.8 10/08/2015  1500      Component Value Date/Time   CALCIUM 9.6 02/02/2018 1007   ALKPHOS 77 01/08/2018 1344   AST 19 01/08/2018 1344   ALT 10 (L) 01/08/2018 1344   BILITOT 0.7 01/08/2018 1344        Lab Results  Component Value Date   WBC 7.5 02/02/2018   HGB 9.8 (L) 02/02/2018   HCT 32.0 (L) 02/02/2018   MCV 76.8 (L) 02/02/2018   PLT 290.0 02/02/2018         Lab Results  Component Value Date   TSH 0.763 01/09/2018     Lab Results  Component Value Date   PROBNP 253.0 (H) 02/02/2018       Lab Results  Component Value Date   ESRSEDRATE 85 (H) 02/02/2018          Assessment & Plan:

## 2018-02-02 NOTE — Progress Notes (Signed)
Spoke with pt and notified of results per Dr. Wert. Pt verbalized understanding and denied any questions. 

## 2018-02-03 ENCOUNTER — Encounter: Payer: Self-pay | Admitting: Internal Medicine

## 2018-02-03 NOTE — Assessment & Plan Note (Addendum)
01/01/2017   try off dpi's and on symbicort 160 2bid   - 02/02/2018  After extensive coaching inhaler device  effectiveness =    75% from a baseline of nearly 0  - 02/02/2018   Walked RA  2 laps @ 185 ft each stopped due to  Sob/ slow pace, sats 98% at end   DDX of  difficult airways management almost all start with A and  include Adherence, Ace Inhibitors, Acid Reflux, Active Sinus Disease, Alpha 1 Antitripsin deficiency, Anxiety masquerading as Airways dz,  ABPA,  Allergy(esp in young), Aspiration (esp in elderly), Adverse effects of meds,  Active smokers, A bunch of PE's (a small clot burden can't cause this syndrome unless there is already severe underlying pulm or vascular dz with poor reserve) plus two Bs  = Bronchiectasis and Beta blocker use..and one C= CHF   Adherence is always the initial "prime suspect" and is a multilayered concern that requires a "trust but verify" approach in every patient - starting with knowing how to use medications, especially inhalers, correctly, keeping up with refills and understanding the fundamental difference between maintenance and prns vs those medications only taken for a very short course and then stopped and not refilled.  - see hfa teaching - return to clinic with all meds in hand using a trust but verify approach to confirm accurate Medication  Reconciliation The principal here is that until we are certain that the  patients are doing what we've asked, it makes no sense to ask them to do more.    Active smoking obviously at top of list and may be why he's better in hosp( not allowed to smoke) - see sep a/p  ? Allergy/ steroid responsive AB component > should do just as well on symb 160 2bid if learns to inhale it more effectively and stop inhaling cigs  ? Adverse drug effects > concerned about amiodarone with ESR so high and will need careful attention to f/u but no desats and cxr s ILD ok to continue for now   ? Anxiety > usually at the bottom of this  list of usual suspects but should be much higher on this pt's based on H and P and note already on psychotropics and may interfere with adherence and also interpretation of response or lack thereof to symptom management which can be quite subjective.   ? chf  > bnp intermediate range / keep on dry side if tolerates    I had an extended discussion with the patient reviewing all relevant studies completed to date and  lasting 25 minutes of a 40  minute post hosp f/u transition of care office  visit with pt not seen by me in     re  severe non-specific but potentially very serious refractory respiratory symptoms of uncertain and potentially multiple  etiologies.  See device teaching which extended face to face time for this visit   Formulary restrictions will be an ongoing challenge for the forseable future and I would be happy to pick an alternative if the pt will first  provide me a list of them but pt  will need to return here for training for any new device that is required eg dpi vs hfa vs respimat.    In meantime we can always provide samples so the patient never runs out of any needed respiratory medications.    Each maintenance medication was reviewed in detail including most importantly the difference between maintenance and prns and under  what circumstances the prns are to be triggered using an action plan format that is not reflected in the computer generated alphabetically organized AVS.    Please see AVS for specific instructions unique to this office visit that I personally wrote and verbalized to the the pt in detail and then reviewed with pt  by my nurse highlighting any changes in therapy/plan of care  recommended at today's visit.

## 2018-02-03 NOTE — Assessment & Plan Note (Signed)
Microcytic anemia may be contributing  But tsh ok

## 2018-02-03 NOTE — Assessment & Plan Note (Signed)
Advised will need f/u per PCP

## 2018-02-03 NOTE — Assessment & Plan Note (Signed)
4-5 min discussion re active cigarette smoking in addition to office E&M  Ask about tobacco use:  active Advise quitting     I emphasized that although we never turn away smokers from the pulmonary clinic, we do ask that they understand that the recommendations that we make  won't work nearly as well in the presence of continued cigarette exposure. In fact, we may very well  reach a point where we can't promise to help the patient if he/she can't quit smoking. (We can and will promise to try to help, we just can't promise what we recommend will really work)  Assess willingness not at all Assist in quit attempt per pcp when ready Arrange follow up. Per pcp

## 2018-02-16 ENCOUNTER — Ambulatory Visit (INDEPENDENT_AMBULATORY_CARE_PROVIDER_SITE_OTHER): Payer: Medicare Other | Admitting: Adult Health

## 2018-02-16 ENCOUNTER — Encounter: Payer: Self-pay | Admitting: Adult Health

## 2018-02-16 VITALS — BP 112/70 | HR 66 | Ht 66.0 in | Wt 114.2 lb

## 2018-02-16 DIAGNOSIS — R634 Abnormal weight loss: Secondary | ICD-10-CM

## 2018-02-16 DIAGNOSIS — J449 Chronic obstructive pulmonary disease, unspecified: Secondary | ICD-10-CM | POA: Diagnosis not present

## 2018-02-16 DIAGNOSIS — J9621 Acute and chronic respiratory failure with hypoxia: Secondary | ICD-10-CM

## 2018-02-16 MED ORDER — IPRATROPIUM BROMIDE 0.02 % IN SOLN: 0.5000 mg | Freq: Three times a day (TID) | RESPIRATORY_TRACT | 5 refills | Status: DC

## 2018-02-16 NOTE — Assessment & Plan Note (Signed)
Add ensure between meals.

## 2018-02-16 NOTE — Patient Instructions (Addendum)
Continue on Symbicort 2 puffs Twice daily  , rinse after use.  Add Ipratropium Neb Three times a day  (may use with Albuterol Neb )  Order for POC oxygen .  Add ensure /protein to diet  Work on not smoking .  Continue on Oxygen 2l/m At bedtime  And with activity .  Follow med calendar closely and bring to each visit .  Follow up with Dr. Melvyn Novas  In 6 weeks with PFT and As needed

## 2018-02-16 NOTE — Progress Notes (Signed)
@Patient  ID: [name], male    DOB: 04-22-1946, 72 y.o.   MRN: [id number]  Chief Complaint  Patient presents with  . Follow-up    COPD     Referring provider: Sheral Apley  HPI: 72 year old male active smoker followed for gold 3-4 COPD and chronic hypoxic and hypercarbic respiratory failure on nocturnal oxygen 2 L Past medical history significant for congestive heart failure, A. fib, coronary artery disease  02/16/2018 Follow up: COPD , O2 RF and Med Review  Patient presents for a 2-week follow-up.  Patient was seen last visit with persistent cough.  Spiriva was stopped due to potential irritation from dry powder.  He was continued on Symbicort twice daily.  Patient says he is doing about the same is unsure if any other changes made any difference with his breathing.  Patient gets short of breath with minimum activity.  If he tries to walk for long period of time or go up any type of incline he gets short of breath.  Patient is quite sedentary and has chronic pain.  He is accompanied by his daughter today.  He says he is not able to do much at all. We discussed pulmonary rehab he declined does not feel like he is able to do this.  Patient does continue to smoke.  We discussed smoking cessation.  He is quite deconditioned.  Weight has trended down over the last year by 10 pounds.  We discussed high-protein diet and adding in a supplement such as Ensure.  He remains on oxygen 2 L at bedtime.  Says he does use it during the daytime if he become short of breath.  He would like to have a portable concentrator.  Today walk test on room air showed no desaturations with O2 saturations remaining at 95 to 97% on room air.  We reviewed all his medications and organize them into a medication count with patient education.  He appears to be taking his medications correctly.  Patient's daughter helps him with his medications.     No Known Allergies  Immunization History    Administered Date(s) Administered  . Influenza,inj,Quad PF,6+ Mos 07/01/2016  . Pneumococcal Conjugate-13 07/01/2016  . Pneumococcal Polysaccharide-23 02/08/2015    Past Medical History:  Diagnosis Date  . AAA (abdominal aortic aneurysm) ([place])   . AAA (abdominal aortic aneurysm) without rupture ([place])   . Acute respiratory failure ([place]) 03/13/2017  . Anxiety   . Atrial fibrillation (HCC)     Brief episode of  . Atrial fibrillation with rapid ventricular response ([place]) 01/06/2016  . Cardiomyopathy, ischemic 10/27/2014  . CHF (congestive heart failure) ([place])   . COPD (chronic obstructive pulmonary disease) ([place])   . Coronary artery disease    Multivessel, S/p stenting to LCx. LHC 03/16/17: unchanged MVD- 50%mid LAD, 85% 1st daig, 100% prox circ, 100% prox RCA  . Depression   . Dyslipidemia   . Headache   . Hyperlipidemia   . Hypertension   . Myocardial infarction ([place])   . Peripheral vascular disease ([place])   . 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 Shortness of breath dyspnea   . Stroke ([place])   . Tobacco dependence   . Ventricular dysfunction    Mild Left    Tobacco History: Social History   Tobacco Use  Smoking Status Current Every Day Smoker  . Packs/day: 0.50  . Years: 20.00  . Pack years: 10.00  . Types: Cigarettes  Smokeless Tobacco Never Used   Ready  to quit: No Counseling given: Yes   Outpatient Encounter Medications as of 02/16/2018  Medication Sig  . albuterol (PROVENTIL HFA;VENTOLIN HFA) 108 (90 Base) MCG/ACT inhaler Inhale 1 puff into the lungs every 6 (six) hours as needed for wheezing or shortness of breath.  [name] albuterol (PROVENTIL) (2.5 MG/3ML) 0.083% nebulizer solution Take 3 mLs (2.5 mg total) by nebulization every 6 (six) hours as needed for wheezing or shortness of breath.  [name] amiodarone (PACERONE) 200 MG tablet TAKE 1 TABLET BY MOUTH ONCE DAILY  . aspirin EC 81 MG tablet Take 81 mg by mouth daily.  [name] atorvastatin (LIPITOR) 80 MG tablet Take 1 tablet (80  mg total) by mouth daily at 6 PM.  . budesonide-formoterol (SYMBICORT) 160-4.5 MCG/ACT inhaler Inhale 2 puffs into the lungs 2 (two) times daily.  . cefUROXime (CEFTIN) 500 MG tablet Take 1 tablet (500 mg total) by mouth 2 (two) times daily with a meal.  . doxycycline (VIBRA-TABS) 100 MG tablet Take 1 tablet (100 mg total) by mouth every 12 (twelve) hours.  . folic acid (FOLVITE) 1 MG tablet Take 1 tablet (1 mg total) by mouth daily.  . furosemide (LASIX) 20 MG tablet Take 1 tablet (20 mg total) by mouth daily.  [name] ipratropium (ATROVENT) 0.02 % nebulizer solution Take 2.5 mLs (0.5 mg total) by nebulization 3 (three) times daily. Dx: J44.9  . mirtazapine (REMERON) 45 MG tablet Take 45 mg by mouth at bedtime.   . nitroGLYCERIN (NITROSTAT) 0.4 MG SL tablet Place 0.4 mg under the tongue every 5 (five) minutes as needed for chest pain.   . Polyethylene Glycol 3350 (MIRALAX PO) Take by mouth daily.  [name] thiamine 100 MG tablet Take 1 tablet (100 mg total) by mouth daily.  [name] tiZANidine (ZANAFLEX) 4 MG tablet Take 4 mg by mouth at bedtime. At bedtime   No facility-administered encounter medications on file as of 02/16/2018.      Review of Systems  Constitutional:   No  weight loss, night sweats,  Fevers, chills, + fatigue, or  lassitude.  HEENT:   No headaches,  Difficulty swallowing,  Tooth/dental problems, or  Sore throat,                No sneezing, itching, ear ache, nasal congestion, post nasal drip,   CV:  No chest pain,  Orthopnea, PND, swelling in lower extremities, anasarca, dizziness, palpitations, syncope.   GI  No heartburn, indigestion, abdominal pain, nausea, vomiting, diarrhea, change in bowel habits, loss of appetite, bloody stools.   Resp:    No chest wall deformity  Skin: no rash or lesions.  GU: no dysuria, change in color of urine, no urgency or frequency.  No flank pain, no hematuria   MS:  No joint pain or swelling.  No decreased range of motion.  No back  pain.    Physical Exam  BP 112/70 (BP Location: Left Arm, Cuff Size: Normal)   Pulse 66   Ht 5\' 6"  (1.676 m)   Wt 114 lb 3.2 oz (51.8 kg)   SpO2 95%   BMI 18.43 kg/m   GEN: A/Ox3; pleasant , NAD, thin cachectic   HEENT:  [place]/AT,  EACs-clear, TMs-wnl, NOSE-clear, THROAT-clear, no lesions, no postnasal drip or exudate noted.   NECK:  Supple w/ fair ROM; no JVD; normal carotid impulses w/o bruits; no thyromegaly or nodules palpated; no lymphadenopathy.    RESP decreased breath sounds in the bases  no accessory muscle use, no dullness to percussion  CARD:  RRR, no m/r/g, no peripheral edema, pulses intact, no cyanosis or clubbing.  GI:   Soft & nt; nml bowel sounds; no organomegaly or masses detected.   Musco: Warm bil, no deformities or joint swelling noted.   Neuro: alert, no focal deficits noted.    Skin: Warm, no lesions or rashes    Lab Results:  CBC   BMET    Imaging: Dg Chest 2 View  Result Date: 02/02/2018 CLINICAL DATA:  Cough, congestion EXAM: CHEST - 2 VIEW COMPARISON:  01/18/2018 FINDINGS: Marked tortuosity of the thoracic aorta. Heart is borderline in size. There is hyperinflation of the lungs compatible with COPD. No confluent opacities or effusions. No acute bony abnormality. IMPRESSION: COPD. Markedly tortuous aorta.  No visible aneurysm. No active disease. Electronically Signed   By: Rolm Baptise M.D.   On: 02/02/2018 10:47   Dg Chest Portable 1 View  Result Date: 01/18/2018 CLINICAL DATA:  Acute onset of diaphoresis. Decreased O2 saturation. EXAM: PORTABLE CHEST 1 VIEW COMPARISON:  Chest radiograph performed 01/08/2018 FINDINGS: The lungs are well-aerated. Vascular congestion is noted. Increased interstitial markings raise concern for pulmonary edema. There is no evidence of focal opacification, pleural effusion or pneumothorax. The cardiomediastinal silhouette is borderline normal in size. No acute osseous abnormalities are seen. IMPRESSION: Vascular  congestion noted. Increased interstitial markings raise concern for pulmonary edema. Electronically Signed   By: Garald Balding M.D.   On: 01/18/2018 23:25     Assessment & Plan:   COPD  GOLD III/ still smoking  Discussed smoking cessation Patient's medications were reviewed today and patient education was given. Computerized medication calendar was adjusted/completed  Add Atrovent /Albuterol Neb Three times a day    Plan  Patient Instructions  Continue on Symbicort 2 puffs Twice daily  , rinse after use.  Add Ipratropium Neb Three times a day  (may use with Albuterol Neb )  Order for POC oxygen .  Add ensure /protein to diet  Work on not smoking .  Continue on Oxygen 2l/m At bedtime  And with activity .  Follow med calendar closely and bring to each visit .  Follow up with Dr. Melvyn Novas  In 6 weeks with PFT and As needed        Weight loss Add ensure between meals.   Acute on chronic respiratory failure with hypoxia (HCC) Chronic respiratory failure on nocturnal oxygen.  He is continue on oxygen at bedtime.  Did not qualify for daytime oxygen with portable system.  We will continue to monitor.     Rexene Edison, NP 02/16/2018

## 2018-02-16 NOTE — Assessment & Plan Note (Signed)
Discussed smoking cessation Patient's medications were reviewed today and patient education was given. Computerized medication calendar was adjusted/completed  Add Atrovent /Albuterol Neb Three times a day    Plan  Patient Instructions  Continue on Symbicort 2 puffs Twice daily  , rinse after use.  Add Ipratropium Neb Three times a day  (may use with Albuterol Neb )  Order for POC oxygen .  Add ensure /protein to diet  Work on not smoking .  Continue on Oxygen 2l/m At bedtime  And with activity .  Follow med calendar closely and bring to each visit .  Follow up with Dr. Melvyn Novas  In 6 weeks with PFT and As needed

## 2018-02-16 NOTE — Progress Notes (Signed)
Chart and office note reviewed in detail  > agree with a/p as outlined    

## 2018-02-16 NOTE — Assessment & Plan Note (Signed)
Chronic respiratory failure on nocturnal oxygen.  He is continue on oxygen at bedtime.  Did not qualify for daytime oxygen with portable system.  We will continue to monitor.

## 2018-02-18 ENCOUNTER — Telehealth: Payer: Self-pay | Admitting: Behavioral Health

## 2018-02-18 ENCOUNTER — Other Ambulatory Visit: Payer: Self-pay | Admitting: Internal Medicine

## 2018-02-18 MED ORDER — DOXYCYCLINE HYCLATE 100 MG PO TABS: 100.0000 mg | ORAL_TABLET | Freq: Two times a day (BID) | ORAL | 11 refills | Status: DC

## 2018-02-18 MED ORDER — CEFUROXIME AXETIL 500 MG PO TABS: 500.0000 mg | ORAL_TABLET | Freq: Two times a day (BID) | ORAL | 11 refills | Status: DC

## 2018-02-18 NOTE — Telephone Encounter (Signed)
Patient's daughter called stating he was discharged from the hospital in May and his medications were reordered by a hospitalist but then there is no one to continues future refills.  She states Mr. [name]'s PCP is out of town and is unable to help at this time.  She states patient is supposed to take Ceftin 500mg  BID and Doxycycline 100mg  Every 12 hours.  Patient is about to run out of both medications.  Patient has a follow up with Dr. Megan Salon on 04/12/2018.  Mr. [name] uses [facility] in [place], [place]. Pricilla Riffle RN

## 2018-02-18 NOTE — Telephone Encounter (Signed)
I ordered 1 year of refills.

## 2018-02-18 NOTE — Telephone Encounter (Signed)
Called patient and his daughter and let then know Dr. Megan Salon sent prescriptions (Ceftin and Doxycycline) electronically to [facility] in [place].  Both verbalized understanding. Pricilla Riffle RN

## 2018-03-02 ENCOUNTER — Ambulatory Visit (INDEPENDENT_AMBULATORY_CARE_PROVIDER_SITE_OTHER): Payer: Medicare Other | Admitting: Sports Medicine

## 2018-03-02 ENCOUNTER — Other Ambulatory Visit: Payer: Self-pay | Admitting: Sports Medicine

## 2018-03-02 ENCOUNTER — Encounter (INDEPENDENT_AMBULATORY_CARE_PROVIDER_SITE_OTHER): Payer: Self-pay

## 2018-03-02 ENCOUNTER — Encounter: Payer: Self-pay | Admitting: Sports Medicine

## 2018-03-02 DIAGNOSIS — E782 Mixed hyperlipidemia: Secondary | ICD-10-CM | POA: Diagnosis not present

## 2018-03-02 DIAGNOSIS — F32A Depression, unspecified: Secondary | ICD-10-CM | POA: Insufficient documentation

## 2018-03-02 DIAGNOSIS — F329 Major depressive disorder, single episode, unspecified: Secondary | ICD-10-CM

## 2018-03-02 DIAGNOSIS — I5022 Chronic systolic (congestive) heart failure: Secondary | ICD-10-CM

## 2018-03-02 DIAGNOSIS — I6529 Occlusion and stenosis of unspecified carotid artery: Secondary | ICD-10-CM | POA: Diagnosis not present

## 2018-03-02 DIAGNOSIS — I10 Essential (primary) hypertension: Secondary | ICD-10-CM

## 2018-03-02 DIAGNOSIS — I481 Persistent atrial fibrillation: Secondary | ICD-10-CM | POA: Diagnosis not present

## 2018-03-02 DIAGNOSIS — F419 Anxiety disorder, unspecified: Secondary | ICD-10-CM

## 2018-03-02 DIAGNOSIS — J471 Bronchiectasis with (acute) exacerbation: Secondary | ICD-10-CM | POA: Diagnosis not present

## 2018-03-02 DIAGNOSIS — D509 Iron deficiency anemia, unspecified: Secondary | ICD-10-CM | POA: Diagnosis not present

## 2018-03-02 DIAGNOSIS — I4819 Other persistent atrial fibrillation: Secondary | ICD-10-CM

## 2018-03-02 MED ORDER — SACUBITRIL-VALSARTAN 24-26 MG PO TABS: 1.0000 | ORAL_TABLET | Freq: Two times a day (BID) | ORAL | 3 refills | Status: DC

## 2018-03-02 MED ORDER — AMIODARONE HCL 200 MG PO TABS: 200.0000 mg | ORAL_TABLET | Freq: Every day | ORAL | 0 refills | Status: DC

## 2018-03-02 MED ORDER — DULOXETINE HCL 30 MG PO CPEP: 30.0000 mg | ORAL_CAPSULE | Freq: Every day | ORAL | 3 refills | Status: DC

## 2018-03-02 MED ORDER — FLUTICASONE-UMECLIDIN-VILANT 100-62.5-25 MCG/INH IN AEPB: 1.0000 | INHALATION_SPRAY | Freq: Every day | RESPIRATORY_TRACT | 11 refills | Status: DC

## 2018-03-02 NOTE — Assessment & Plan Note (Signed)
Continue atorvastatin for now.

## 2018-03-02 NOTE — Assessment & Plan Note (Signed)
We will watch this as we start Entresto.

## 2018-03-02 NOTE — Assessment & Plan Note (Signed)
Ischemic heart disease, systolic CHF, ejection fraction 30 to 35%. Continue amiodarone, considering ejection fraction we should add low-dose Entresto. With his frailty we will only start a low-dose for the next month. He is also likely a candidate for ICD placement at some point, though we will have to wait considering his recent bacteremia.

## 2018-03-02 NOTE — Assessment & Plan Note (Signed)
Starting Cymbalta 30, return in 1 month for PHQ and GAD. 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

## 2018-03-02 NOTE — Assessment & Plan Note (Addendum)
Rechecking labs including anemia studies. If appears to be iron deficient I will supplement with iron sulfate. Hemoccult negative in the office today. Avoid oral anticoagulants other than aspirin.  Anemia has improved, but still profoundly iron deficient, needs to do iron sulfate 325 mg 2-3 times a day with Colace so that he does not get constipated.  Prescription written.

## 2018-03-02 NOTE — Progress Notes (Addendum)
Subjective:    CC: New patient visit with the below complaints as noted in HPI:  HPI:  [name] is a pleasant but debilitated 72 year old male with multiple medical problems as follows:  Recent hospitalization for staphylococcal bacteremia, currently on antibiotics.  Recent pneumonia.  COPD on Symbicort, followed by pulmonology but with baseline symptoms of shortness of breath, wheezing, coughing on Symbicort alone.  Has not yet been on triple therapy.  Depression: With severe symptoms of depression and anxiety without suicidal or homicidal ideation, not currently being treated.  Systolic congestive heart failure: EF 30 to 35%, no ICD, currently on amiodarone, atrial fibrillation, not on an ACE, arm, or Entresto.  No current orthopnea, PND, mild dyspnea on exertion without chest pain.  History of AAA repair.  Hyperlipidemia: Historically stable on atorvastatin.  Hypertension: Stable.  I reviewed the past medical history, family history, social history, surgical history, and allergies today and no changes were needed.  Please see the problem list section below in epic for further details.  Past Medical History: Past Medical History:  Diagnosis Date  . AAA (abdominal aortic aneurysm) ([place])   . AAA (abdominal aortic aneurysm) without rupture ([place])   . Acute respiratory failure ([place]) 03/13/2017  . Anxiety   . Atrial fibrillation (HCC)     Brief episode of  . Atrial fibrillation with rapid ventricular response ([place]) 01/06/2016  . Cardiomyopathy, ischemic 10/27/2014  . CHF (congestive heart failure) ([place])   . COPD (chronic obstructive pulmonary disease) ([place])   . Coronary artery disease    Multivessel, S/p stenting to LCx. LHC 03/16/17: unchanged MVD- 50%mid LAD, 85% 1st daig, 100% prox circ, 100% prox RCA  . Depression   . Dyslipidemia   . Headache   . Hyperlipidemia   . Hypertension   . Myocardial infarction ([place])   . Peripheral vascular disease ([place])   . 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 Shortness of breath dyspnea   . Stroke ([place])   . Tobacco dependence   . Ventricular dysfunction    Mild Left   Past Surgical History: Past Surgical History:  Procedure Laterality Date  . ABDOMINAL AORTIC ANEURYSM REPAIR  01/03/2016   Procedure: ANEURYSM ABDOMINAL AORTIC REPAIR USING 14MM X 8MM X40CM HEMASHIELD GOLD GRAFT;  Surgeon: Conrad Palisades, MD;  Location: [facility] OR;  Service: Vascular;;  . ABDOMINAL AORTIC ENDOVASCULAR STENT GRAFT N/A 01/03/2016   Procedure: ABDOMINAL AORTIC ENDOVASCULAR STENT GRAFT IMPLANTED/EXPLANTED;  Surgeon: Conrad Cochranville, MD;  Location: [facility];  Service: Vascular;  Laterality: N/A;  . AXILLARY-FEMORAL BYPASS GRAFT Bilateral 09/08/2017   Procedure: BYPASS GRAFT AXILLA-BIFEMORAL;  Surgeon: Rosetta Posner, MD;  Location: [facility];  Service: Vascular;  Laterality: Bilateral;  . BACK SURGERY     cervical  . FALSE ANEURYSM REPAIR Bilateral 09/08/2017   Procedure: Bilateral Groin Exploration, Removal of Prosthetic graft. Vein Patch Closure of bilateral common femoral Arteries.;  Surgeon: Rosetta Posner, MD;  Location: [facility] OR;  Service: Vascular;  Laterality: Bilateral;  . HAND SURGERY Left    thumb surgery  . HERNIA REPAIR Right   . LEFT HEART CATH AND CORONARY ANGIOGRAPHY N/A 03/16/2017   Procedure: Left Heart Cath and Coronary Angiography;  Surgeon: Martinique, Peter M, MD;  Location: [facility] CV LAB;  Service: Cardiovascular;  Laterality: N/A;  . LEFT HEART CATHETERIZATION WITH CORONARY ANGIOGRAM N/A 10/27/2014   Procedure: LEFT HEART CATHETERIZATION WITH CORONARY ANGIOGRAM;  Surgeon: Blane Ohara, MD;  Location: [facility] CATH LAB;  Service: Cardiovascular;  Laterality: N/A;  . NECK SURGERY    . stent     pt unsure of location  . US ECHOCARDIOGRAPHY  12-26-2008   EF 40-45%  . WRIST SURGERY Right    Social History: Social History   Socioeconomic History  . Marital status: Widowed    Spouse name: Not on file  . Number of children: 1  . Years of education:  Not on file  . Highest education level: Not on file  Occupational History  . Not on file  Social Needs  . Financial resource strain: Not on file  . Food insecurity:    Worry: Not on file    Inability: Not on file  . Transportation needs:    Medical: Not on file    Non-medical: Not on file  Tobacco Use  . Smoking status: Current Every Day Smoker    Packs/day: 0.50    Years: 20.00    Pack years: 10.00    Types: Cigarettes  . Smokeless tobacco: Never Used  Substance and Sexual Activity  . Alcohol use: No  . Drug use: No  . Sexual activity: Not Currently    Birth control/protection: None  Lifestyle  . Physical activity:    Days per week: Not on file    Minutes per session: Not on file  . Stress: Not on file  Relationships  . Social connections:    Talks on phone: Not on file    Gets together: Not on file    Attends religious service: Not on file    Active member of club or organization: Not on file    Attends meetings of clubs or organizations: Not on file    Relationship status: Not on file  Other Topics Concern  . Not on file  Social History Narrative   ** Merged History Encounter **       Family History: Family History  Problem Relation Age of Onset  . AAA (abdominal aortic aneurysm) Mother   . Cancer Father        bone marrow   Allergies: No Known Allergies Medications: See med rec.  Review of Systems: No headache, visual changes, nausea, vomiting, diarrhea, constipation, dizziness, abdominal pain, skin rash, fevers, chills, night sweats, swollen lymph nodes, weight loss, chest pain, body aches, joint swelling, muscle aches, shortness of breath, mood changes, visual or auditory hallucinations.  Objective:    General: Well Developed, well nourished, and in no acute distress.  Neuro: Alert and oriented x3, extra-ocular muscles intact, sensation grossly intact.  HEENT: Normocephalic, atraumatic, pupils equal round reactive to light, neck supple, no masses, no  lymphadenopathy, thyroid nonpalpable.  Skin: Warm and dry, no rashes noted.  Cardiac: Regular rate and rhythm, no murmurs rubs or gallops.  Respiratory: Clear to auscultation bilaterally. Not using accessory muscles, speaking in full sentences.  Abdominal: Soft, nontender, nondistended, positive bowel sounds, no masses, no organomegaly.  Musculoskeletal: Shoulder, elbow, wrist, hip, knee, ankle stable, and with full range of motion. Rectal: Good tone, smooth prostate, Hemoccult negative.  Impression and Recommendations:    The patient was counselled, risk factors were discussed, anticipatory guidance given.  Persistent atrial fibrillation Controlled with amiodarone, no newer oral anticoagulants considering recent blood loss anemia. Continue aspirin for now. I will refill amiodarone once but I think this would be safer to be done by cardiology.  Bronchiectasis with acute exacerbation COPD, bronchiectasis. Switching from Symbicort to Trelegy, persistent symptoms at rest with Symbicort.  Systolic congestive heart failure (HCC) Ischemic heart disease,  systolic CHF, ejection fraction 30 to 35%. Continue amiodarone, considering ejection fraction we should add low-dose Entresto. With his frailty we will only start a low-dose for the next month. He is also likely a candidate for ICD placement at some point, though we will have to wait considering his recent bacteremia.  Microcytic anemia Rechecking labs including anemia studies. If appears to be iron deficient I will supplement with iron sulfate. Hemoccult negative in the office today. Avoid oral anticoagulants other than aspirin.  Anemia has improved, but still profoundly iron deficient, needs to do iron sulfate 325 mg 2-3 times a day with Colace so that he does not get constipated.  Prescription written.  Benign essential hypertension We will watch this as we start Entresto.  Mixed hyperlipidemia Continue atorvastatin for  now.  Anxiety and depression Starting Cymbalta 30, return in 1 month for PHQ and GAD. 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 ___________________________________________ Gwen Her. Dianah Field, M.D., ABFM., CAQSM. Primary Care and [place] Instructor of [place] [employer] [facility] [employer]

## 2018-03-02 NOTE — Assessment & Plan Note (Signed)
Controlled with amiodarone, no newer oral anticoagulants considering recent blood loss anemia. Continue aspirin for now. I will refill amiodarone once but I think this would be safer to be done by cardiology.

## 2018-03-02 NOTE — Assessment & Plan Note (Signed)
COPD, bronchiectasis. Switching from Symbicort to Trelegy, persistent symptoms at rest with Symbicort.

## 2018-03-03 DIAGNOSIS — D509 Iron deficiency anemia, unspecified: Secondary | ICD-10-CM | POA: Diagnosis not present

## 2018-03-04 LAB — ANEMIA PROFILE B
%SAT: 8 % (calc) — ABNORMAL LOW (ref 20–48)
ABS Retic: 21650 cells/uL — ABNORMAL LOW (ref 25000–9000)
Basophils Absolute: 63 {cells}/uL (ref 0–200)
Basophils Relative: 0.8 %
Eosinophils Absolute: 119 cells/uL (ref 15–500)
Eosinophils Relative: 1.5 %
Ferritin: 22 ng/mL — ABNORMAL LOW (ref 24–380)
Folate: 15.3 ng/mL
HCT: 35 % — ABNORMAL LOW (ref 38.5–50.0)
Hemoglobin: 10.1 g/dL — ABNORMAL LOW (ref 13.2–17.1)
Iron: 29 ug/dL — ABNORMAL LOW (ref 50–180)
Lymphs Abs: 1209 cells/uL (ref 850–3900)
MCH: 23.2 pg — ABNORMAL LOW (ref 27.0–33.0)
MCHC: 28.9 g/dL — ABNORMAL LOW (ref 32.0–36.0)
MCV: 80.5 fL (ref 80.0–100.0)
MPV: 10 fL (ref 7.5–12.5)
Monocytes Relative: 7.6 %
Neutro Abs: 5909 cells/uL (ref 1500–7800)
Neutrophils Relative %: 74.8 %
Platelets: 296 10*3/uL (ref 140–400)
RBC: 4.35 Million/uL (ref 4.20–5.80)
RDW: 22.3 % — ABNORMAL HIGH (ref 11.0–15.0)
Retic Ct Pct: 0.5 %
TIBC: 345 ug/dL (ref 250–425)
Total Lymphocyte: 15.3 %
Vitamin B-12: 400 pg/mL (ref 200–1100)
WBC mixed population: 600 cells/uL (ref 200–950)
WBC: 7.9 Thousand/uL (ref 3.8–10.8)

## 2018-03-04 LAB — COMPREHENSIVE METABOLIC PANEL
AG Ratio: 1.2 (calc) (ref 1.0–2.5)
Albumin: 3.7 g/dL (ref 3.6–5.1)
Alkaline phosphatase (APISO): 87 U/L (ref 40–115)
CO2: 27 mmol/L (ref 20–32)
Calcium: 9.2 mg/dL (ref 8.6–10.3)
Chloride: 103 mmol/L (ref 98–110)
Globulin: 3.2 g/dL (calc) (ref 1.9–3.7)
Potassium: 4.4 mmol/L (ref 3.5–5.3)
Total Bilirubin: 0.4 mg/dL (ref 0.2–1.2)

## 2018-03-04 LAB — COMPREHENSIVE METABOLIC PANEL WITH GFR
ALT: 13 U/L (ref 9–46)
AST: 19 U/L (ref 10–35)
BUN/Creatinine Ratio: 30 (calc) — ABNORMAL HIGH (ref 6–22)
BUN: 26 mg/dL — ABNORMAL HIGH (ref 7–25)
Creat: 0.87 mg/dL (ref 0.70–1.18)
Glucose, Bld: 119 mg/dL (ref 65–139)
Sodium: 137 mmol/L (ref 135–146)
Total Protein: 6.9 g/dL (ref 6.1–8.1)

## 2018-03-04 LAB — TSH: TSH: 0.99 m[IU]/L (ref 0.40–4.50)

## 2018-03-04 LAB — LIPID PANEL W/REFLEX DIRECT LDL
Cholesterol: 114 mg/dL (ref <200)
HDL: 44 mg/dL (ref >40)
LDL Cholesterol (Calc): 49 mg/dL
Non-HDL Cholesterol (Calc): 70 mg/dL (ref <130)
Total CHOL/HDL Ratio: 2.6 (calc) (ref <5.0)
Triglycerides: 130 mg/dL (ref <150)

## 2018-03-04 MED ORDER — FERROUS SULFATE 325 (65 FE) MG PO TBEC: 325.0000 mg | DELAYED_RELEASE_TABLET | Freq: Three times a day (TID) | ORAL | 11 refills | Status: AC

## 2018-03-04 MED ORDER — DOCUSATE SODIUM 100 MG PO CAPS: 100.0000 mg | ORAL_CAPSULE | Freq: Three times a day (TID) | ORAL | 3 refills | Status: AC | PRN

## 2018-03-04 NOTE — Addendum Note (Signed)
Addended by: Silverio Decamp on: 03/04/2018 08:34 AM   Modules accepted: Orders

## 2018-03-22 ENCOUNTER — Telehealth: Payer: Self-pay | Admitting: Sports Medicine

## 2018-03-22 MED ORDER — TIZANIDINE HCL 4 MG PO TABS: 4.0000 mg | ORAL_TABLET | Freq: Every day | ORAL | 0 refills | Status: DC

## 2018-03-22 NOTE — Telephone Encounter (Signed)
Done

## 2018-03-22 NOTE — Telephone Encounter (Signed)
Routing for review. Last entered under historical provider.

## 2018-03-22 NOTE — Telephone Encounter (Signed)
[name] is a new patient for Dr.T. His daughter called stating that all of his meds are listed under old pcp. He currently needs the tizanidine refilled, but the pharmacy needs new script. They are still using [facility] on [address] in [place].

## 2018-03-29 NOTE — Progress Notes (Signed)
Established Open AAA Repair   History of Present Illness   [name] is a 72 y.o. ([birth year]) male who presents for cc: tearing sensation RLQ.  Prior procedures include: 1. Failed EVAR due to 90 degree aortic neck angulation, ABF (01/03/16) 2. Excision B ABF limbs for infection, B ax-SFA BPG w/ Dr. Donnetta Hutching (01/01/190  Pt had extended hospitalization for aorotbifemoral limb infection.  Pt refused resection of the main body.  The patient underwent extended abx use.  Tagged WBC scan demonstrated no residual active infection.  The patient continues to take suppression of abx.  Most recent aortoiliac duplex (Date: 03/31/2018) demonstrates: no proximal PSA.  The patient has had back pain.  The RLQ tearing sensation is resolved.  Pt denies any further pain.   The patient's PMH, PSH, SH, and FamHx were reviewed on 03/31/18 are unchanged from 12/23/17.  Current Outpatient Medications  Medication Sig Dispense Refill  . albuterol (PROVENTIL HFA;VENTOLIN HFA) 108 (90 Base) MCG/ACT inhaler Inhale 1 puff into the lungs every 6 (six) hours as needed for wheezing or shortness of breath.    [name] albuterol (PROVENTIL) (2.5 MG/3ML) 0.083% nebulizer solution Take 3 mLs (2.5 mg total) by nebulization every 6 (six) hours as needed for wheezing or shortness of breath. 150 mL 2  . amiodarone (PACERONE) 200 MG tablet TAKE 1 TABLET(200 MG) BY MOUTH DAILY 90 tablet 0  . aspirin EC 81 MG tablet Take 81 mg by mouth daily.    [name] atorvastatin (LIPITOR) 80 MG tablet Take 1 tablet (80 mg total) by mouth daily at 6 PM. 30 tablet 5  . cefUROXime (CEFTIN) 500 MG tablet Take 1 tablet (500 mg total) by mouth 2 (two) times daily with a meal. 60 tablet 11  . docusate sodium (COLACE) 100 MG capsule Take 1 capsule (100 mg total) by mouth 3 (three) times daily as needed. 90 capsule 3  . doxycycline (VIBRA-TABS) 100 MG tablet Take 1 tablet (100 mg total) by mouth every 12 (twelve) hours. 60 tablet 11  . DULoxetine (CYMBALTA)  30 MG capsule Take 1 capsule (30 mg total) by mouth daily. 30 capsule 3  . ferrous sulfate 325 (65 FE) MG EC tablet Take 1 tablet (325 mg total) by mouth 3 (three) times daily with meals. 90 tablet 11  . Fluticasone-Umeclidin-Vilant (TRELEGY ELLIPTA) 100-62.5-25 MCG/INH AEPB Inhale 1 puff into the lungs daily. 1 each 11  . folic acid (FOLVITE) 1 MG tablet Take 1 tablet (1 mg total) by mouth daily. (Patient taking differently: Take 400 mg by mouth daily. ) 30 tablet 0  . furosemide (LASIX) 20 MG tablet Take 1 tablet (20 mg total) by mouth daily. 30 tablet 0  . nitroGLYCERIN (NITROSTAT) 0.4 MG SL tablet Place 0.4 mg under the tongue every 5 (five) minutes as needed for chest pain.     . Polyethylene Glycol 3350 (MIRALAX PO) Take by mouth daily.    . sacubitril-valsartan (ENTRESTO) 24-26 MG Take 1 tablet by mouth 2 (two) times daily. 60 tablet 3  . thiamine 100 MG tablet Take 1 tablet (100 mg total) by mouth daily. 30 tablet 1  . tiZANidine (ZANAFLEX) 4 MG tablet Take 1 tablet (4 mg total) by mouth at bedtime. At bedtime 90 tablet 0   No current facility-administered medications for this visit.     No Known Allergies  On ROS today: limited RLQ tearing sensation, no intermittent claudication    Physical Examination   Vitals:   03/31/18 0953 03/31/18 1000  BP: (!) 175/97 (!) 181/98  Pulse: 60 60  Resp: 18   Temp: 97.9 F (36.6 C)   TempSrc: Oral   SpO2: 97%   Weight: 113 lb (51.3 kg)   Height: 5\' 6"  (1.676 m)    Body mass index is 18.24 kg/m.  General Alert, O x 3, Cachectic, NAD  Pulmonary Sym exp, good B air movt, palpable pulse in proximal B ax-fem graft  Cardiac RRR, Nl S1, S2, no Murmurs, No rubs, No S3,S4  Vascular Vessel Right Left  Radial Palpable Palpable  Brachial Palpable Palpable  Carotid Palpable, No Bruit Palpable, No Bruit  Aorta Not palpable N/A  Femoral Palpable Palpable  Popliteal Not palpable Not palpable  PT Palpable Palpable  DP Palpable Palpable      Gastro- intestinal soft, non-distended, non-tender to palpation, No guarding or rebound, no HSM, no masses, no CVAT B, No palpable prominent aortic pulse, Surgical incisions well healed  Musculo- skeletal M/S 5/5 throughout  , Extremities without ischemic changes  , No edema present, No visible varicosities , No Lipodermatosclerosis present, palpable B distal ax-fem BPG at groin level  Neurologic Pain and light touch intact in extremities , Motor exam as listed above    Non-invasive Vascular Imaging   ABI (03/31/2018)  R:   ABI: 1.07 (1.06),   PT: bi  DP: tri  TBI:  0.79  L:   ABI: 1.03 (1.03),   PT: bi  DP: tri  TBI: 0.83  Aortoiliac duplex (03/31/2018)  No proximal aortic pseudoaneurysm  B ax-SFA bypass duplex (03/31/2018)  Widely patent bilateral ax-SFA bypass   Medical Decision Making   [name] is a 72 y.o. male who presents s/p ABF, infection of bilateral ABF requiring B ABF limb excision, B ax-SFA BPG, L inguinal hernia.     Pt on chronic abx under ID's direction  B ax-fem BPG without any evidence of disease.  Continued BLE ABI, B ax-fem BPG duplex, and aortoiliac duplex in 6 months.  Thank you for allowing [place] to participate in this patient's care.   Adele Barthel, MD, FACS [facility] Office: [phone] Pager: [phone]

## 2018-03-31 ENCOUNTER — Ambulatory Visit (INDEPENDENT_AMBULATORY_CARE_PROVIDER_SITE_OTHER)
Admission: RE | Admit: 2018-03-31 | Discharge: 2018-03-31 | Disposition: A | Discharge status: Home / Self Care | Payer: Medicare Other | Source: Ambulatory Visit | Attending: Vascular Surgery | Admitting: Vascular Surgery

## 2018-03-31 ENCOUNTER — Ambulatory Visit (INDEPENDENT_AMBULATORY_CARE_PROVIDER_SITE_OTHER): Payer: Medicare Other | Admitting: Vascular Surgery

## 2018-03-31 ENCOUNTER — Encounter: Payer: Self-pay | Admitting: Vascular Surgery

## 2018-03-31 ENCOUNTER — Ambulatory Visit (HOSPITAL_COMMUNITY)
Admission: RE | Admit: 2018-03-31 | Discharge: 2018-03-31 | Disposition: A | Discharge status: Home / Self Care | Payer: Medicare Other | Source: Ambulatory Visit | Attending: Vascular Surgery | Admitting: Vascular Surgery

## 2018-03-31 ENCOUNTER — Other Ambulatory Visit: Payer: Self-pay

## 2018-03-31 VITALS — BP 181/98 | HR 60 | Temp 97.9°F | Resp 18 | Ht 66.0 in | Wt 113.0 lb

## 2018-03-31 DIAGNOSIS — T827XXD Infection and inflammatory reaction due to other cardiac and vascular devices, implants and grafts, subsequent encounter: Secondary | ICD-10-CM

## 2018-03-31 DIAGNOSIS — I6529 Occlusion and stenosis of unspecified carotid artery: Secondary | ICD-10-CM | POA: Diagnosis not present

## 2018-03-31 DIAGNOSIS — I739 Peripheral vascular disease, unspecified: Secondary | ICD-10-CM

## 2018-03-31 DIAGNOSIS — F17200 Nicotine dependence, unspecified, uncomplicated: Secondary | ICD-10-CM | POA: Insufficient documentation

## 2018-03-31 DIAGNOSIS — I714 Abdominal aortic aneurysm, without rupture, unspecified: Secondary | ICD-10-CM

## 2018-03-31 DIAGNOSIS — I2510 Atherosclerotic heart disease of native coronary artery without angina pectoris: Secondary | ICD-10-CM | POA: Diagnosis not present

## 2018-03-31 DIAGNOSIS — I722 Aneurysm of renal artery: Secondary | ICD-10-CM

## 2018-03-31 DIAGNOSIS — I252 Old myocardial infarction: Secondary | ICD-10-CM | POA: Insufficient documentation

## 2018-04-01 ENCOUNTER — Telehealth: Payer: Self-pay | Admitting: Cardiovascular Disease

## 2018-04-01 NOTE — Telephone Encounter (Signed)
OK to refill

## 2018-04-01 NOTE — Telephone Encounter (Signed)
Patient's daughter called and requested refills on his Amiodarone and Atorvastatin medication.  They are no longer seeing her PCP.  Please advise, thank you.

## 2018-04-01 NOTE — Telephone Encounter (Signed)
New Message:       Pt c/o medication issue:  1. Name of Medication:  amiodarone (PACERONE) 200 MG tablet  atorvastatin (LIPITOR) 80 MG tablet  2. How are you currently taking this medication (dosage and times per day)? TAKE 1 TABLET(200 MG) BY MOUTH DAILY  Take 1 tablet (80 mg total) by mouth daily at 6 PM.  3. Are you having a reaction (difficulty breathing--STAT)? No  4. What is your medication issue? Pt's daughter is calling and states he no longer sees the PCP who provided the refill request for this pt. She is wanting to know if Dr. [name] can prescribe this medication for him now.

## 2018-04-05 ENCOUNTER — Encounter: Payer: Self-pay | Admitting: Internal Medicine

## 2018-04-05 ENCOUNTER — Other Ambulatory Visit: Payer: Self-pay

## 2018-04-05 ENCOUNTER — Ambulatory Visit (INDEPENDENT_AMBULATORY_CARE_PROVIDER_SITE_OTHER): Payer: Medicare Other | Admitting: Internal Medicine

## 2018-04-05 VITALS — BP 120/74 | HR 60 | Ht 66.5 in | Wt 112.0 lb

## 2018-04-05 DIAGNOSIS — J9611 Chronic respiratory failure with hypoxia: Secondary | ICD-10-CM

## 2018-04-05 DIAGNOSIS — F17210 Nicotine dependence, cigarettes, uncomplicated: Secondary | ICD-10-CM | POA: Diagnosis not present

## 2018-04-05 DIAGNOSIS — J471 Bronchiectasis with (acute) exacerbation: Secondary | ICD-10-CM | POA: Diagnosis not present

## 2018-04-05 DIAGNOSIS — J449 Chronic obstructive pulmonary disease, unspecified: Secondary | ICD-10-CM

## 2018-04-05 DIAGNOSIS — I4819 Other persistent atrial fibrillation: Secondary | ICD-10-CM

## 2018-04-05 DIAGNOSIS — I6529 Occlusion and stenosis of unspecified carotid artery: Secondary | ICD-10-CM | POA: Diagnosis not present

## 2018-04-05 LAB — PULMONARY FUNCTION TEST
DL/VA % pred: 47 %
DL/VA: 2.03 ml/min/mmHg/L
DLCO UNC % PRED: 37 %
DLCO UNC: 9.84 ml/min/mmHg
FEF 25-75 POST: 0.77 L/s
FEF 25-75 PRE: 0.6 L/s
FEF2575-%Change-Post: 29 %
FEF2575-%PRED-POST: 37 %
FEF2575-%Pred-Pre: 29 %
FEV1-%Change-Post: 11 %
FEV1-%Pred-Post: 60 %
FEV1-%Pred-Pre: 53 %
FEV1-Post: 1.61 L
FEV1-Pre: 1.44 L
FEV1FVC-%Change-Post: 6 %
FEV1FVC-%Pred-Pre: 64 %
FEV6-%CHANGE-POST: 8 %
FEV6-%PRED-POST: 93 %
FEV6-%PRED-PRE: 85 %
FEV6-PRE: 2.95 L
FEV6-Post: 3.21 L
FEV6FVC-%CHANGE-POST: 3 %
FEV6FVC-%PRED-PRE: 102 %
FEV6FVC-%Pred-Post: 106 %
FVC-%Change-Post: 5 %
FVC-%PRED-POST: 87 %
FVC-%Pred-Pre: 83 %
FVC-Post: 3.22 L
FVC-Pre: 3.07 L
POST FEV1/FVC RATIO: 50 %
POST FEV6/FVC RATIO: 100 %
PRE FEV1/FVC RATIO: 47 %
Pre FEV6/FVC Ratio: 96 %
RV % pred: 167 %
RV: 3.69 L
TLC % pred: 110 %
TLC: 6.77 L

## 2018-04-05 MED ORDER — FLUTICASONE-UMECLIDIN-VILANT 100-62.5-25 MCG/INH IN AEPB: 1.0000 | INHALATION_SPRAY | Freq: Every day | RESPIRATORY_TRACT | 0 refills | Status: DC

## 2018-04-05 MED ORDER — AMIODARONE HCL 200 MG PO TABS: ORAL_TABLET | ORAL | 3 refills | Status: DC

## 2018-04-05 MED ORDER — ATORVASTATIN CALCIUM 80 MG PO TABS: 80.0000 mg | ORAL_TABLET | Freq: Every day | ORAL | 3 refills | Status: DC

## 2018-04-05 NOTE — Progress Notes (Signed)
Subjective:    Patient ID: [name], male    DOB: 1945-11-22,    MRN: [id number]    Brief patient profile:   34 yowm "cut way back on smoking" December 07 2016 with onset doe x 2015 on breo/ spiriva x 2017 referred to pulmonary clinic 01/01/2017 by Nunzio Cobbs Hegecock PA for copd and proved to have GOLD III critieria on initial pulmonary eval.   History of Present Illness  01/01/2017 1st [facility] office visit/ Deby Adger  Breo/ spiriva dpi  Chief Complaint  Patient presents with  . Pulmonary Consult    Referred by [facility] PA-C, Pt here today c/o increase sob with exertion, finds it hard to catch his breath when he does any fast walking,pt does cough alot with phlegm, slight wheezing,some chest tightness pt still currently smokes,     Just started spiriva dpi  But ? Albuterol helps the most / cough is most productive n ams but nothing purulent produced  Doe = MMRC3 = can't walk 100 yards even at a slow pace at a flat grade s stopping due to sob  - can do HT leaning on cart  rec The key is to stop smoking completely before smoking completely stops you!  Work on inhaler technique:  relax and gently blow all the way out then take a nice smooth deep breath back in, triggering the inhaler at same time you start breathing in.  Hold for up to 5 seconds if you can. Blow out thru nose. Rinse and gargle with water when done Plan A = Automatic = Symbicort 160 Take 2 puffs first thing in am and then another 2 puffs about 12 hours later.  Plan B = Backup Only use your albuterol as a rescue medication Plan C = Crisis - only use your albuterol nebulizer if you first try Plan B  Please schedule a follow up office visit in 4 weeks, sooner if needed  with all medications /inhalers/ solutions in hand so we can verify exactly what you are taking. This includes all medications from all doctors and over the counters Did not return as rec     Date of Admission: 01/18/2018 10:19 PM Date of  Discharge: 5/16/20195/16/2019 Attending Physician: No att. providers found  Discharge Diagnosis: 1. Acute on chronic heart failure exacerbation 2. Afib 3. 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 Anemia 6. Obstructive CAD  Active Problems:   Chronic obstructive pulmonary disease (HCC)   PAD (peripheral artery disease) (HCC)   Acute on chronic combined systolic and diastolic heart failure (HCC)   Acute on chronic respiratory failure with hypoxia (HCC)   Acute on chronic HFrEF (heart failure with reduced ejection fraction) ([place])     02/02/2018  transition of care  ov/Deshay Blumenfeld re: re-esatablish re copd/  Post hosp f/u ov / still actively smoking Chief Complaint  Patient presents with  . Acute Visit    pt states PCP will no longer refill his neb solution. He states his breathing has been worse over the past 2 months. He gets out of breath walking to the mailbox. He is using his albuterol inhaler 2 x daily neb with albuterol once daily.   Dyspnea:  Happens at rest s warning but never wakes from sound sleep Cough: rattles a bit  Sleep: ok flat p tylenol pm and muscle spasms not resp rx   SABA use:  At least bid with very poor hfa   Says gets quite  a  Bit better during admits then worse w/in a week but so far since last admit not losing any ground "yet"  rec symbicort 160 Take 2 puffs first thing in am and then another 2 puffs about 12 hours later.  Take albuterol prn    . 04/05/2018  f/u ov/Louis Gaw re: copd gold II/ still smoking  Chief Complaint  Patient presents with  . Follow-up    Breathing is overall doing well. He is coughing and producing some green sputum. He is using his albuterol inhaler 1-2 x per wk and he has not needed neb.   Dyspnea:  MMRC2 = can't walk a nl pace on a flat grade s sob but does fine slow and flat  Cough: green thick mucus x years esp in am then better, not using flutter valve as rec  SABA use: he is confused  with meds - given by Daugher who is not living with him says she gives him all med including prns "he knows how / when to use them"  Clearly not the case on asking the pt directly.  02: concentrator 2 lpm  No tanks or portable system "never qualifies" per daughter   No obvious day to day or daytime variability or assoc   mucus plugs or hemoptysis or cp or chest tightness, subjective wheeze or overt sinus or hb symptoms.   Sleeping: on back one pillow  without nocturnal    exacerbation  of respiratory  c/o's or need for noct saba. Also denies any obvious fluctuation of symptoms with weather or environmental changes or other aggravating or alleviating factors except as outlined above   No unusual exposure hx or h/o childhood pna/ asthma or knowledge of premature birth.  Current Allergies, Complete Past Medical History, Past Surgical History, Family History, and Social History were reviewed in [employer] record.  ROS  The following are not active complaints unless bolded Hoarseness, sore throat, dysphagia, dental problems, itching, sneezing,  nasal congestion or discharge of excess mucus or purulent secretions, ear ache,   fever, chills, sweats, unintended wt loss or wt gain, classically pleuritic or exertional cp,  orthopnea pnd or arm/hand swelling  or leg swelling, presyncope, palpitations, abdominal pain, anorexia, nausea, vomiting, diarrhea  or change in bowel habits or change in bladder habits, change in stools or change in urine, dysuria, hematuria,  rash, arthralgias, visual complaints, headache, numbness, weakness or ataxia or problems with walking or coordination,  change in mood or  memory.        Current Meds  Medication Sig  . albuterol (PROVENTIL HFA;VENTOLIN HFA) 108 (90 Base) MCG/ACT inhaler Inhale 1 puff into the lungs every 6 (six) hours as needed for wheezing or shortness of breath.  [name] albuterol (PROVENTIL) (2.5 MG/3ML) 0.083% nebulizer solution Take 3 mLs  (2.5 mg total) by nebulization every 6 (six) hours as needed for wheezing or shortness of breath.  [name] amiodarone (PACERONE) 200 MG tablet TAKE 1 TABLET(200 MG) BY MOUTH DAILY  . aspirin EC 81 MG tablet Take 81 mg by mouth daily.  [name] atorvastatin (LIPITOR) 80 MG tablet Take 1 tablet (80 mg total) by mouth daily at 6 PM.  . cefUROXime (CEFTIN) 500 MG tablet Take 1 tablet (500 mg total) by mouth 2 (two) times daily with a meal.  . docusate sodium (COLACE) 100 MG capsule Take 1 capsule (100 mg total) by mouth 3 (three) times daily as needed.  . doxycycline (VIBRA-TABS) 100 MG tablet Take 1 tablet (100  mg total) by mouth every 12 (twelve) hours.  . DULoxetine (CYMBALTA) 30 MG capsule Take 1 capsule (30 mg total) by mouth daily.  . ferrous sulfate 325 (65 FE) MG EC tablet Take 1 tablet (325 mg total) by mouth 3 (three) times daily with meals.  . Fluticasone-Umeclidin-Vilant (TRELEGY ELLIPTA) 100-62.5-25 MCG/INH AEPB Inhale 1 puff into the lungs daily.  . folic acid (FOLVITE) 809 MCG tablet Take 400 mcg by mouth daily.  . nitroGLYCERIN (NITROSTAT) 0.4 MG SL tablet Place 0.4 mg under the tongue every 5 (five) minutes as needed for chest pain.   . OXYGEN 2lpm with sleep  . Polyethylene Glycol 3350 (MIRALAX PO) Take by mouth daily.  . sacubitril-valsartan (ENTRESTO) 24-26 MG Take 1 tablet by mouth 2 (two) times daily.  [name] thiamine 100 MG tablet Take 1 tablet (100 mg total) by mouth daily.  [name] tiZANidine (ZANAFLEX) 4 MG tablet Take 1 tablet (4 mg total) by mouth at bedtime. At bedtime  . [  Fluticasone-Umeclidin-Vilant (TRELEGY ELLIPTA) 100-62.5-25 MCG/INH AEPB Inhale 1 puff into the lungs daily.                      Objective:   Physical Exam  Edentulous wm nad / rattling cough    04/05/2018      112  02/02/2018       114   01/01/17 133 lb (60.3 kg)  03/04/16 120 lb 9.6 oz (54.7 kg)  02/14/16 123 lb (55.8 kg)      Vital signs reviewed - Note on arrival 02 sats  95% on RA      HEENT: nl  dentition / oropharynx. Nl external ear canals without cough reflex - mild  bilateral non-specific turbinate edema     NECK :  without JVD/Nodes/TM/ nl carotid upstrokes bilaterally   LUNGS: no acc muscle use,  Mild barrel  contour chest wall with bilateral  Distant bs with mild/ mod insp /exp coarse rhonchi  and  without cough on insp or exp maneuver and mod   Hyperresonant  to  percussion bilaterally     CV:  RRR  no s3 or murmur or increase in P2, and no edema   ABD:  soft and nontender with pos mid insp Hoover's  in the supine position. No bruits or organomegaly appreciated, bowel sounds nl  MS:   Nl gait/  ext warm without deformities, calf tenderness, cyanosis or clubbing No obvious joint restrictions   SKIN: warm and dry without lesions    NEURO:  alert, approp, nl sensorium with  no motor or cerebellar deficits apparent.                                Assessment & Plan:

## 2018-04-05 NOTE — Patient Instructions (Addendum)
Plan A = Automatic = Trelegy  And stop ipaptriopium solution    Plan B = Backup Only use your albuterol (VENTOLIN)  as a rescue medication to be used if you can't catch your breath by resting or doing a relaxed purse lip breathing pattern.  - The less you use it, the better it will work when you need it. - Ok to use the inhaler up to 2 puffs  every 4 hours if you must but call for appointment if use goes up over your usual need - Don't leave home without it !!  (think of it like the spare tire for your car)   Plan C = Crisis - only use your albuterol nebulizer if you first try Plan B and it fails to help > ok to use the nebulizer up to every 4 hours but if start needing it regularly call for immediate appointment   Plan D = Doctor - call me if B and C not adequate  Plan E = ER - go to ER or call 911 if all else fails     For cough > mucinex or mucinex dm 1200 mg every 12 hours with the fluttter as much as you can     If you are satisfied with your treatment plan,  let your doctor know and he/she can either refill your medications or you can return here when your prescription runs out.     If in any way you are not 100% satisfied,  please tell us.  If 100% better, tell your friends!  Pulmonary follow up is as needed     If want to return here as needed > you must bring all medications/ solutions/ inhalers/ devices

## 2018-04-05 NOTE — Telephone Encounter (Signed)
Spoke to wife and informed her that medication was refilled.

## 2018-04-05 NOTE — Progress Notes (Signed)
PFT done today. 

## 2018-04-06 ENCOUNTER — Encounter: Payer: Self-pay | Admitting: Internal Medicine

## 2018-04-06 DIAGNOSIS — J9611 Chronic respiratory failure with hypoxia: Secondary | ICD-10-CM | POA: Insufficient documentation

## 2018-04-06 NOTE — Assessment & Plan Note (Signed)
01/01/2017   try off dpi's and on symbicort 160 2bid   - 02/02/2018   Walked RA  2 laps @ 185 ft each stopped due to  Sob/ slow pace, sats 98% at end  - PFT's  04/05/2018  FEV1 1.61 (60 % ) ratio 50  p 11 % improvement from saba p nothing prior to study with DLCO  37 % corrects to 47  % for alv volume    - 04/05/2018  After extensive coaching inhaler device  effectiveness =    90% with elipta from baseline of 50%    Group D in terms of symptom/risk and laba/lama/ICS  therefore appropriate rx at this point > continue trelegy but pay more attention to how/ when to use saba in 1st hfa then neb form as needed and call if needing more backup than baseline.   I had an extended discussion with the patient/daughter Katrina reviewing all relevant studies completed to date and  lasting 15 to 20 minutes of a 25 minute visit    See device teaching which extended face to face time for this visit.  Each maintenance medication was reviewed in detail including emphasizing most importantly the difference between maintenance and prns and under what circumstances the prns are to be triggered using an action plan format that is not reflected in the computer generated alphabetically organized AVS which I have not found useful in most complex patients, especially with respiratory illnesses  Please see AVS for specific instructions unique to this visit that I personally wrote and verbalized to the the pt in detail and then reviewed with pt  by my nurse highlighting any  changes in therapy recommended at today's visit to their plan of care.

## 2018-04-06 NOTE — Assessment & Plan Note (Signed)
4-5 min discussion re active cigarette smoking in addition to office E&M  Ask about tobacco use:   Ongoing but both pt and daughter minimize it  Advise quitting   Even passive smoke exp is setting of chronic cough and freq AB exacerbations is a problem in this setting and have strongly advised avoid all smoke exposure Assess willingness:  Not committed at this point Assist in quit attempt:  Per PCP when ready Arrange follow up:   Follow up per Primary Care planned

## 2018-04-06 NOTE — Assessment & Plan Note (Signed)
He has chronically purulent sputum typical of bronchiectasis and would benefit most by avoiding all cig exp and by using mucinex / flutter valve as prev instructed    If returns, explained critical that he do so with all meds in hand using a trust but verify approach to confirm accurate Medication  Reconciliation The principal here is that until we are certain that the  patients are doing what we've asked, it makes no sense to ask them to do more.

## 2018-04-06 NOTE — Assessment & Plan Note (Signed)
Apparently just has concentrator per [facility] as did not qualify for amb 02 and just using at hs but may need ono RA to justify this.   Need to make sure he has backup tanks for use if power goes out.

## 2018-04-12 ENCOUNTER — Encounter: Payer: Self-pay | Admitting: Internal Medicine

## 2018-04-12 ENCOUNTER — Ambulatory Visit (INDEPENDENT_AMBULATORY_CARE_PROVIDER_SITE_OTHER): Payer: Medicare Other | Admitting: Internal Medicine

## 2018-04-12 ENCOUNTER — Ambulatory Visit (INDEPENDENT_AMBULATORY_CARE_PROVIDER_SITE_OTHER): Payer: Medicare Other | Admitting: Licensed Clinical Social Worker

## 2018-04-12 DIAGNOSIS — I6529 Occlusion and stenosis of unspecified carotid artery: Secondary | ICD-10-CM

## 2018-04-12 DIAGNOSIS — F321 Major depressive disorder, single episode, moderate: Secondary | ICD-10-CM

## 2018-04-12 DIAGNOSIS — T827XXD Infection and inflammatory reaction due to other cardiac and vascular devices, implants and grafts, subsequent encounter: Secondary | ICD-10-CM

## 2018-04-12 NOTE — Progress Notes (Signed)
[facility] for Infectious Disease  Patient Active Problem List   Diagnosis Date Noted  . Infection of vascular bypass graft ([place]) 09/09/2017    Priority: High  . Chronic respiratory failure with hypoxia ([place]) 04/06/2018  . Mixed hyperlipidemia 03/02/2018  . Benign essential hypertension 03/02/2018  . Anxiety and depression 03/02/2018  . DOE (dyspnea on exertion) 02/02/2018  . Community acquired pneumonia of right lower lobe of lung ([place])   . 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 PAD (peripheral artery disease) ([place]) 09/08/2017  . Microcytic anemia 03/19/2017  . Coagulase negative Staphylococcus bacteremia 03/16/2017  . Demand ischemia of myocardium: Severe 3 vessel CAD, with hypoxic respiratory failure 03/16/2017  . S/P AAA (abdominal aortic aneurysm) repair   . Cigarette smoker   . Systolic congestive heart failure ([place])   . COPD  GOLD III/ still smoking  10/15/2014  . Bronchiectasis with acute exacerbation ([place]) 10/15/2014  . Persistent atrial fibrillation ([place])   . 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   ALBUTEROL (PROVENTIL) (2.5 MG/3ML) 0.083% NEBULIZER SOLUTION    Take 3 mLs (2.5 mg total) by nebulization every 6 (six) hours as needed for wheezing or shortness of breath.   AMIODARONE (PACERONE) 200 MG TABLET    TAKE 1 TABLET(200 MG) BY MOUTH DAILY   ASPIRIN EC 81 MG TABLET    Take 81 mg by mouth daily.   ATORVASTATIN (LIPITOR) 80 MG TABLET    Take 1 tablet (80 mg total) by mouth daily at 6 PM.   CEFUROXIME (CEFTIN) 500 MG TABLET    Take 1 tablet (500 mg total) by mouth 2 (two) times daily  with a meal.   DOCUSATE SODIUM (COLACE) 100 MG CAPSULE    Take 1 capsule (100 mg total) by mouth 3 (three) times daily as needed.   DOXYCYCLINE (VIBRA-TABS) 100 MG TABLET    Take 1 tablet (100 mg total) by mouth every 12 (twelve) hours.   DULOXETINE (CYMBALTA) 30 MG CAPSULE    Take 1 capsule (30 mg total) by mouth daily.   FERROUS SULFATE 325 (65 FE) MG EC TABLET    Take 1 tablet (325 mg total) by mouth 3 (three) times daily with meals.   FLUTICASONE-UMECLIDIN-VILANT (TRELEGY ELLIPTA) 100-62.5-25 MCG/INH AEPB    Inhale 1 puff into the lungs daily.   FOLIC ACID (FOLVITE) 389 MCG TABLET    Take 400 mcg by mouth daily.   FUROSEMIDE (LASIX) 20 MG TABLET    Take 1 tablet (20 mg total) by mouth daily.   NITROGLYCERIN (NITROSTAT) 0.4 MG SL TABLET    Place 0.4 mg under the tongue every 5 (five) minutes as needed for chest pain.    OXYGEN    2lpm with sleep   POLYETHYLENE GLYCOL 3350 (MIRALAX PO)    Take by mouth daily.   SACUBITRIL-VALSARTAN (ENTRESTO) 24-26 MG    Take 1 tablet by mouth 2 (two) times daily.   THIAMINE 100 MG TABLET    Take 1 tablet (100 mg total) by mouth daily.   TIZANIDINE (ZANAFLEX) 4 MG TABLET    Take 1  tablet (4 mg total) by mouth at bedtime. At bedtime  Modified Medications   No medications on file  Discontinued Medications   No medications on file    Subjective: [name] is in for his pain follow-up visit. He is a 72 y.o. male with multiple chronic medical conditions who underwent aortobifemoral bypass grafting in April 2017.  He developed a ventral incisional hernia and was admitted to [facility] on 07/31/2017.  He underwent hernia repair.  He also had incidental incision and drainage of bilateral groin areas.  Purulence and necrosis was noted.  Operative Gram stain showed no organisms and cultures were negative.  He was discharged on empiric doxycycline.  He recently developed acute swelling of his right groin and was admitted here on New Year's Day.  He was found to  have an infected pseudoaneurysm.  He underwent emergent surgery with resection of both left and right limbs of the graft and bilateral axillary to femoral bypass grafts.  Operative Gram stain showed no organisms.  Cultures from his right groin were sterile.  Diphtheroids grew from his left groin cultures.  He was treated with empiric IV antibiotics in the hospital.  Rifampin was not used because he was on anticoagulants that have drug drug interactions with.  He had been hospitalized last July with staph hominis bacteremia.  His vascular surgeons discussed their concerns about possible infection of the retained aortic limb of his graft but he did not want to consider any further surgery.  He also refused to have a PICC placed for outpatient IV antibiotics.  I elected to discharge him on oral cefuroxime and doxycycline.  He seems to be tolerating his antibiotics well and he is feeling better except for concerns that he has been unable to gain the weight that he lost and pain in his groin and along his axillary grafts.  He states that he is not having any fever, chills, sweats, nausea, vomiting or diarrhea.  He saw his vascular surgeon, Dr. Adele Barthel who ordered a tagged white blood cell scan because of ongoing concerns about persistent infection of the retained limb of his previous aortic graft.  That scan was normal.  Review of Systems: Review of Systems  Constitutional: Negative for chills, diaphoresis and fever.  Cardiovascular: Negative for chest pain.  Gastrointestinal: Positive for constipation. Negative for abdominal pain, diarrhea, nausea and vomiting.    Past Medical History:  Diagnosis Date  . AAA (abdominal aortic aneurysm) ([place])   . AAA (abdominal aortic aneurysm) without rupture ([place])   . Acute respiratory failure ([place]) 03/13/2017  . Anxiety   . Atrial fibrillation (HCC)     Brief episode of  . Atrial fibrillation with rapid ventricular response ([place]) 01/06/2016  . Cardiomyopathy,  ischemic 10/27/2014  . CHF (congestive heart failure) ([place])   . COPD (chronic obstructive pulmonary disease) ([place])   . Coronary artery disease    Multivessel, S/p stenting to LCx. LHC 03/16/17: unchanged MVD- 50%mid LAD, 85% 1st daig, 100% prox circ, 100% prox RCA  . Depression   . Dyslipidemia   . Headache   . Hyperlipidemia   . Hypertension   . Myocardial infarction ([place])   . Peripheral vascular disease ([place])   . 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 Shortness of breath dyspnea   . Stroke ([place])   . Tobacco dependence   . Ventricular dysfunction    Mild Left    Social History   Tobacco Use  . Smoking status: Current Every Day  Smoker    Packs/day: 0.50    Years: 20.00    Pack years: 10.00    Types: Cigarettes  . Smokeless tobacco: Never Used  . Tobacco comment: cutting back  Substance Use Topics  . Alcohol use: No  . Drug use: No    Family History  Problem Relation Age of Onset  . AAA (abdominal aortic aneurysm) Mother   . Cancer Father        bone marrow    No Known Allergies  Objective: Vitals:   04/12/18 1440  BP: (!) 155/90  Pulse: 89  Temp: 97.6 F (36.4 C)  Weight: 116 lb (52.6 kg)  Height: 5\' 6"  (1.676 m)   Body mass index is 18.72 kg/m.  Physical Exam  Constitutional: He is oriented to person, place, and time.  He is accompanied by his daughter.  Cardiovascular: Normal rate and regular rhythm.  No murmur heard. Pulmonary/Chest: Breath sounds normal. He has no rales.  Abdominal: He exhibits no distension. There is no tenderness.  His surgical incisions healed nicely. Bilateral axillofemoral grafts are palpable and without signs of any infection.  Neurological: He is alert and oriented to person, place, and time. Gait normal.  Skin: No rash noted.  Psychiatric: Mood and affect normal.     Problem List Items Addressed This Visit      High   Infection of vascular bypass graft ([place])    I talked to him again about the risks and benefits of  continuing long-term antibiotics.  I told him that the only way we would know for certain if his infection had been cured or not would be to stop all antibiotics and then wait 6 to 8 weeks to see if there is any sign of relapse.  The negative white blood cell scan is reassuring but cannot rule out suppressed infection.  He favors continuing antibiotics for now.  He will follow-up in 3 months.          Daniel Bickers, MD [facility] [phone] pager   [phone] cell 04/12/2018, 2:58 PM

## 2018-04-12 NOTE — Assessment & Plan Note (Signed)
I talked to him again about the risks and benefits of continuing long-term antibiotics.  I told him that the only way we would know for certain if his infection had been cured or not would be to stop all antibiotics and then wait 6 to 8 weeks to see if there is any sign of relapse.  The negative white blood cell scan is reassuring but cannot rule out suppressed infection.  He favors continuing antibiotics for now.  He will follow-up in 3 months.

## 2018-04-13 NOTE — BH Specialist Note (Signed)
Integrated Behavioral Health Initial Visit  MRN: [id number] Name: [name] [facility]  Number of [place] Clinician visits:: 1/6 Session Start time: 3:00pm  Session End time: 3:20pm Total time: 20 minutes  Type of Service: [place] Interpretor:No. Interpretor Name and Language: n/a   Warm Hand Off Completed.       SUBJECTIVE: [name] is a 72 y.o. male accompanied by Daughter Patient was referred by Dr. Megan Salon for depressive symptoms. Patient reports the following symptoms/concerns: unable to leave home without fear of medical event, feeling very alone, no interest in anything, no motivation, feelings of grief and loss , feeling worthless, sleeping a lot Duration of problem: 7 months; Severity of problem: moderate  OBJECTIVE: Mood: Depressed and Affect: Depressed Risk of harm to self or others: No plan to harm self or others  LIFE CONTEXT: Patient has experienced many losses lately. His wife died in 2014/11/24, and he had promised her he would look after her brother who died a few months ago. Brother-in-law was his closest friend, and though patient's son lives in the home with him he feels very alone since brother-in-law's death. Patient's dog also died within a few months of his brother-in-law. On New Year's Day 2019, patient visited the ED due to pain from a recent hernia surgery and was hospitalized for over a week after it was found that he had very infected incisions and he underwent two aortofemoral bypasses. Since this time he has been experiencing chronic pain, increased difficulty breathing, and other difficulties in daily functioning. Patient is on oxygen, and at times has a lot of trouble catching his breath, and oxygen levels will plummet. There is no obvious trigger for this, though, and because his oxygen levels are not low at the times he is tested he has not qualified for portable oxygen. Patient likes to go and  do things, but is afraid to because he may become unable to breathe. Therefore he never leaves the house alone anymore. He reports that sometimes walking the incline to his mailbox makes him struggle to catch breath, but other times it does not. 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 He had many hobbies and was rarely home when he did not have a car to work on. The loss of his freedom and ability to be mobile, compounded with the losses of his wife, brother-in-law, and dog, have resulted in patient feeling depressed daily and having little connection with the outside world.    GOALS ADDRESSED: Patient will: 1. Reduce symptoms of: depression   INTERVENTIONS: Interventions utilized: Motivational Interviewing, Solution-Focused Strategies and Supportive Counseling    ASSESSMENT: Patient currently experiencing depressed mood, little to no motivation or energy, difficulty getting out of bed/hypersomnia, anhedonia, feelings of worthlessness and low self-esteem, anxiety over medical conditions, grief/bereavement. He has experienced many of these symptoms before the loss of his brother-in-law and dog, so bereavement does not account for them entirely. The most consistent diagnosis for his symptomology at this time is Major Depressive Disorder, Single Episode, Moderate degree. Counselor guided patient to explore his thoughts and feelings about his life now. Patient identifies that he cannot motivate himself to get much done, though he does work around the house, and feels he is not as useful as he used to be. Counselor and patient explored patient's support system, and patient identified that while he and son get along well and talk a lot, he is not the  type of person to engage in activities with patient or help him out much. Patient states that daughter is helpful, but she does not live with him and has her own family so she is not always available. Daughter expresses  concern with patient's state of mind and his medical problems. She reports being happy to help patient out, and she would like to see him feel better to the point that he can engage in his hobbies and self-care activities again. Counselor guided patient to identify his goals and clarify what a life he would feel better about living might look like. Patient would like to be able to get out more, but is unsure how to do this without portable oxygen. Counselor agreed to explore senior programs and get back in touch with patient about them.    Patient may benefit from senior programs and group support, as well as counseling as needed.[name]  PLAN: 1. Patient will contact counselor to set next appointment.    Lillie Fragmin, LCSW

## 2018-04-16 ENCOUNTER — Ambulatory Visit (INDEPENDENT_AMBULATORY_CARE_PROVIDER_SITE_OTHER): Payer: Medicare Other | Admitting: Sports Medicine

## 2018-04-16 ENCOUNTER — Ambulatory Visit (INDEPENDENT_AMBULATORY_CARE_PROVIDER_SITE_OTHER): Payer: Medicare Other

## 2018-04-16 ENCOUNTER — Encounter: Payer: Self-pay | Admitting: Sports Medicine

## 2018-04-16 DIAGNOSIS — D509 Iron deficiency anemia, unspecified: Secondary | ICD-10-CM | POA: Diagnosis not present

## 2018-04-16 DIAGNOSIS — F419 Anxiety disorder, unspecified: Secondary | ICD-10-CM | POA: Diagnosis not present

## 2018-04-16 DIAGNOSIS — F329 Major depressive disorder, single episode, unspecified: Secondary | ICD-10-CM

## 2018-04-16 DIAGNOSIS — M47812 Spondylosis without myelopathy or radiculopathy, cervical region: Secondary | ICD-10-CM

## 2018-04-16 DIAGNOSIS — I6529 Occlusion and stenosis of unspecified carotid artery: Secondary | ICD-10-CM | POA: Diagnosis not present

## 2018-04-16 DIAGNOSIS — M542 Cervicalgia: Secondary | ICD-10-CM | POA: Diagnosis not present

## 2018-04-16 DIAGNOSIS — I5022 Chronic systolic (congestive) heart failure: Secondary | ICD-10-CM | POA: Diagnosis not present

## 2018-04-16 DIAGNOSIS — I10 Essential (primary) hypertension: Secondary | ICD-10-CM | POA: Diagnosis not present

## 2018-04-16 DIAGNOSIS — I6522 Occlusion and stenosis of left carotid artery: Secondary | ICD-10-CM

## 2018-04-16 DIAGNOSIS — F32A Depression, unspecified: Secondary | ICD-10-CM

## 2018-04-16 LAB — POCT HEMOGLOBIN: Hemoglobin: 10.5 g/dL — AB (ref 14.1–18.1)

## 2018-04-16 MED ORDER — DULOXETINE HCL 60 MG PO CPEP: 60.0000 mg | ORAL_CAPSULE | Freq: Every day | ORAL | 3 refills | Status: DC

## 2018-04-16 MED ORDER — POLYETHYLENE GLYCOL 3350 17 G PO PACK: 17.0000 g | PACK | Freq: Two times a day (BID) | ORAL | 11 refills | Status: AC

## 2018-04-16 NOTE — Assessment & Plan Note (Signed)
Ischemic heart disease with systolic CHF and an ejection fraction of 30 to 35%. Continue amiodarone, I added low-dose Entresto at the last visit, he seems to be tolerating well. At some point he may be a candidate for ICD placement considering his ejection fraction that we would need to wait considering his recent infection and bacteremia.

## 2018-04-16 NOTE — Assessment & Plan Note (Signed)
Persistent depressive symptoms and pain, this is related to depression which is likely secondary to his chronic debilitation and multiple medical problems. Increasing Cymbalta to 60 mg daily, return in 1 month for repeat PHQ/GAD.

## 2018-04-16 NOTE — Assessment & Plan Note (Signed)
Severe neck pain, x-ray, MRI with weakness in the right hand. We will probably see some improvement as Cymbalta starts to work. Ultimately we may need to proceed with epidurals. Adding home rehab exercises as well. Follow-up to go over MRI results.

## 2018-04-16 NOTE — Assessment & Plan Note (Signed)
Blood pressure is now well controlled on current regimen.

## 2018-04-16 NOTE — Assessment & Plan Note (Addendum)
Currently doing iron 3 times daily. Anemia studies showed profound iron deficiency. Hemoglobin was 9.8 a month and a half ago, 10.5 today.

## 2018-04-16 NOTE — Progress Notes (Signed)
Subjective:    CC: Recheck multiple medical issues  HPI: [name] is a pleasant but complex 72 year old male.  Iron deficiency anemia: Improved with aggressive iron supplementation, still has a ways to go.  COPD: Improved considerably with the addition of Trelegy inhaler.  Systolic CHF: Tolerating Entresto well.  Neck pain: Axial, history of multilevel cervical fusion with persistent pain, consistent with failed neck surgery syndrome.  Nothing overtly radicular, no progressive weakness, trauma.  Depression: Persistent symptoms in spite of Cymbalta 30.  No suicidal or homicidal ideation.  I reviewed the past medical history, family history, social history, surgical history, and allergies today and no changes were needed.  Please see the problem list section below in epic for further details.  Past Medical History: Past Medical History:  Diagnosis Date  . AAA (abdominal aortic aneurysm) ([place])   . AAA (abdominal aortic aneurysm) without rupture ([place])   . Acute respiratory failure ([place]) 03/13/2017  . Anxiety   . Atrial fibrillation (HCC)     Brief episode of  . Atrial fibrillation with rapid ventricular response ([place]) 01/06/2016  . Cardiomyopathy, ischemic 10/27/2014  . CHF (congestive heart failure) ([place])   . COPD (chronic obstructive pulmonary disease) ([place])   . Coronary artery disease    Multivessel, S/p stenting to LCx. LHC 03/16/17: unchanged MVD- 50%mid LAD, 85% 1st daig, 100% prox circ, 100% prox RCA  . Depression   . Dyslipidemia   . Headache   . Hyperlipidemia   . Hypertension   . Myocardial infarction ([place])   . Peripheral vascular disease ([place])   . 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 Shortness of breath dyspnea   . Stroke ([place])   . Tobacco dependence   . Ventricular dysfunction    Mild Left   Past Surgical History: Past Surgical History:  Procedure Laterality Date  . ABDOMINAL AORTIC ANEURYSM REPAIR  01/03/2016   Procedure: ANEURYSM ABDOMINAL AORTIC REPAIR USING  14MM X 8MM X40CM HEMASHIELD GOLD GRAFT;  Surgeon: Conrad [place], MD;  [facility];  Service: Vascular;;  . ABDOMINAL AORTIC ENDOVASCULAR STENT GRAFT N/A 01/03/2016   Procedure: ABDOMINAL AORTIC ENDOVASCULAR STENT GRAFT IMPLANTED/EXPLANTED;  Surgeon: Conrad [place], MD;  Location: [place];  Service: Vascular;  Laterality: N/A;  . AXILLARY-FEMORAL BYPASS GRAFT Bilateral 09/08/2017   Procedure: BYPASS GRAFT AXILLA-BIFEMORAL;  Surgeon: Rosetta Posner, MD;  Location: [place];  Service: Vascular;  Laterality: Bilateral;  . BACK SURGERY     cervical  . FALSE ANEURYSM REPAIR Bilateral 09/08/2017   Procedure: Bilateral Groin Exploration, Removal of Prosthetic graft. Vein Patch Closure of bilateral common femoral Arteries.;  Surgeon: Rosetta Posner, MD;  Location: [facility] OR;  Service: Vascular;  Laterality: Bilateral;  . HAND SURGERY Left    thumb surgery  . HERNIA REPAIR Right   . LEFT HEART CATH AND CORONARY ANGIOGRAPHY N/A 03/16/2017   Procedure: Left Heart Cath and Coronary Angiography;  Surgeon: Martinique, Peter M, MD;  Location: [place] CV LAB;  Service: Cardiovascular;  Laterality: N/A;  . LEFT HEART CATHETERIZATION WITH CORONARY ANGIOGRAM N/A 10/27/2014   Procedure: LEFT HEART CATHETERIZATION WITH CORONARY ANGIOGRAM;  Surgeon: Blane Ohara, MD;  Location: [facility] CATH LAB;  Service: Cardiovascular;  Laterality: N/A;  . NECK SURGERY    . stent     pt unsure of location  . US ECHOCARDIOGRAPHY  12-26-2008   EF 40-45%  . WRIST SURGERY Right    Social History: Social History   Socioeconomic History  . Marital status:  Widowed    Spouse name: Not on file  . Number of children: 1  . Years of education: Not on file  . Highest education level: Not on file  Occupational History  . Not on file  Social Needs  . Financial resource strain: Not on file  . Food insecurity:    Worry: Not on file    Inability: Not on file  . Transportation needs:    Medical: Not on file    Non-medical: Not on file  Tobacco Use    . Smoking status: Current Every Day Smoker    Packs/day: 0.50    Years: 20.00    Pack years: 10.00    Types: Cigarettes  . Smokeless tobacco: Never Used  . Tobacco comment: cutting back  Substance and Sexual Activity  . Alcohol use: No  . Drug use: No  . Sexual activity: Not Currently    Birth control/protection: None  Lifestyle  . Physical activity:    Days per week: Not on file    Minutes per session: Not on file  . Stress: Not on file  Relationships  . Social connections:    Talks on phone: Not on file    Gets together: Not on file    Attends religious service: Not on file    Active member of club or organization: Not on file    Attends meetings of clubs or organizations: Not on file    Relationship status: Not on file  Other Topics Concern  . Not on file  Social History Narrative   ** Merged History Encounter **       Family History: Family History  Problem Relation Age of Onset  . AAA (abdominal aortic aneurysm) Mother   . Cancer Father        bone marrow   Allergies: No Known Allergies Medications: See med rec.  Review of Systems: No fevers, chills, night sweats, weight loss, chest pain, or shortness of breath.   Objective:    General: Well Developed, well nourished, and in no acute distress.  Neuro: Alert and oriented x3, extra-ocular muscles intact, sensation grossly intact.  HEENT: Normocephalic, atraumatic, pupils equal round reactive to light, neck supple, no masses, no lymphadenopathy, thyroid nonpalpable.  Skin: Warm and dry, no rashes. Cardiac: Regular rate and rhythm, no murmurs rubs or gallops, no lower extremity edema.  Respiratory: Clear to auscultation bilaterally. Not using accessory muscles, speaking in full sentences.  Impression and Recommendations:    Anxiety and depression Persistent depressive symptoms and pain, this is related to depression which is likely secondary to his chronic debilitation and multiple medical  problems. Increasing Cymbalta to 60 mg daily, return in 1 month for repeat PHQ/GAD.  Benign essential hypertension Blood pressure is now well controlled on current regimen.  Microcytic anemia Currently doing iron 3 times daily. Anemia studies showed profound iron deficiency. Hemoglobin was 9.8 a month and a half ago, 10.5 today.   Systolic congestive heart failure (HCC) Ischemic heart disease with systolic CHF and an ejection fraction of 30 to 35%. Continue amiodarone, I added low-dose Entresto at the last visit, he seems to be tolerating well. At some point he may be a candidate for ICD placement considering his ejection fraction that we would need to wait considering his recent infection and bacteremia.  Cervical spondylosis Severe neck pain, x-ray, MRI with weakness in the right hand. We will probably see some improvement as Cymbalta starts to work. Ultimately we may need to proceed with epidurals.  Adding home rehab exercises as well. Follow-up to go over MRI results. ___________________________________________ Gwen Her. Dianah Field, M.D., ABFM., CAQSM. Primary Care and [place] Instructor of [place] of [facility] of Medicine

## 2018-04-19 ENCOUNTER — Other Ambulatory Visit: Payer: Medicare Other

## 2018-04-26 ENCOUNTER — Other Ambulatory Visit: Payer: Self-pay

## 2018-04-26 ENCOUNTER — Other Ambulatory Visit: Payer: Medicare Other

## 2018-04-26 DIAGNOSIS — I714 Abdominal aortic aneurysm, without rupture, unspecified: Secondary | ICD-10-CM

## 2018-04-26 DIAGNOSIS — I723 Aneurysm of iliac artery: Secondary | ICD-10-CM

## 2018-04-26 DIAGNOSIS — I739 Peripheral vascular disease, unspecified: Secondary | ICD-10-CM

## 2018-04-26 DIAGNOSIS — I7773 Dissection of renal artery: Secondary | ICD-10-CM

## 2018-05-03 ENCOUNTER — Ambulatory Visit (INDEPENDENT_AMBULATORY_CARE_PROVIDER_SITE_OTHER): Payer: Medicare Other

## 2018-05-03 DIAGNOSIS — M4802 Spinal stenosis, cervical region: Secondary | ICD-10-CM | POA: Diagnosis not present

## 2018-05-03 DIAGNOSIS — M47812 Spondylosis without myelopathy or radiculopathy, cervical region: Secondary | ICD-10-CM

## 2018-05-03 DIAGNOSIS — M542 Cervicalgia: Secondary | ICD-10-CM | POA: Diagnosis not present

## 2018-05-14 ENCOUNTER — Other Ambulatory Visit: Payer: Self-pay | Admitting: Sports Medicine

## 2018-05-14 ENCOUNTER — Ambulatory Visit (INDEPENDENT_AMBULATORY_CARE_PROVIDER_SITE_OTHER): Payer: Medicare Other | Admitting: Sports Medicine

## 2018-05-14 ENCOUNTER — Encounter: Payer: Self-pay | Admitting: Sports Medicine

## 2018-05-14 VITALS — BP 107/67 | HR 86 | Ht 66.0 in | Wt 120.0 lb

## 2018-05-14 DIAGNOSIS — F32A Depression, unspecified: Secondary | ICD-10-CM

## 2018-05-14 DIAGNOSIS — J471 Bronchiectasis with (acute) exacerbation: Secondary | ICD-10-CM | POA: Diagnosis not present

## 2018-05-14 DIAGNOSIS — F419 Anxiety disorder, unspecified: Principal | ICD-10-CM

## 2018-05-14 DIAGNOSIS — F329 Major depressive disorder, single episode, unspecified: Secondary | ICD-10-CM

## 2018-05-14 DIAGNOSIS — R1310 Dysphagia, unspecified: Secondary | ICD-10-CM | POA: Diagnosis not present

## 2018-05-14 DIAGNOSIS — D509 Iron deficiency anemia, unspecified: Secondary | ICD-10-CM

## 2018-05-14 DIAGNOSIS — I6529 Occlusion and stenosis of unspecified carotid artery: Secondary | ICD-10-CM | POA: Diagnosis not present

## 2018-05-14 DIAGNOSIS — M47812 Spondylosis without myelopathy or radiculopathy, cervical region: Secondary | ICD-10-CM | POA: Diagnosis not present

## 2018-05-14 DIAGNOSIS — J449 Chronic obstructive pulmonary disease, unspecified: Secondary | ICD-10-CM | POA: Diagnosis not present

## 2018-05-14 LAB — POCT HEMOGLOBIN: Hemoglobin: 12.7 g/dL — AB (ref 14.1–18.1)

## 2018-05-14 MED ORDER — DULOXETINE HCL 30 MG PO CPEP: ORAL_CAPSULE | ORAL | 0 refills | Status: DC

## 2018-05-14 MED ORDER — VENLAFAXINE HCL ER 75 MG PO CP24: 75.0000 mg | ORAL_CAPSULE | Freq: Every day | ORAL | 1 refills | Status: DC

## 2018-05-14 MED ORDER — TRAZODONE HCL 50 MG PO TABS: 50.0000 mg | ORAL_TABLET | Freq: Every day | ORAL | 1 refills | Status: DC

## 2018-05-14 MED ORDER — TIZANIDINE HCL 4 MG PO TABS: 4.0000 mg | ORAL_TABLET | Freq: Every day | ORAL | 0 refills | Status: DC

## 2018-05-14 MED ORDER — VENLAFAXINE HCL ER 37.5 MG PO CP24: ORAL_CAPSULE | ORAL | 0 refills | Status: DC

## 2018-05-14 MED ORDER — FLUTICASONE-UMECLIDIN-VILANT 100-62.5-25 MCG/INH IN AEPB: 1.0000 | INHALATION_SPRAY | Freq: Every day | RESPIRATORY_TRACT | 11 refills | Status: DC

## 2018-05-14 MED ORDER — DULOXETINE HCL 30 MG PO CPEP: 30.0000 mg | ORAL_CAPSULE | Freq: Every day | ORAL | 3 refills | Status: DC

## 2018-05-14 NOTE — Progress Notes (Signed)
Subjective:    CC: Follow-up  HPI: Depression: Not improving after a month on the new dose of Cymbalta, patient would like to switch to something else.  No suicidal or homicidal ideation.  Difficulty swallowing: Usually pills, sometimes with liquids, no choking but he is somewhat debilitated and having difficulty gaining weight.  Neck pain: Multilevel cervical DDD, has never had an epidural, agreeable to proceed, we had an MRI at the last visit.  Symptoms are predominantly right-sided axial neck pain with radiation around the top of the head and down the right arm.  Microcytic anemia: Iron deficiency, this is from acute blood loss, improving considerably, up to 12.7 today.  I reviewed the past medical history, family history, social history, surgical history, and allergies today and no changes were needed.  Please see the problem list section below in epic for further details.  Past Medical History: Past Medical History:  Diagnosis Date  . AAA (abdominal aortic aneurysm) ([place])   . AAA (abdominal aortic aneurysm) without rupture ([place])   . Acute respiratory failure ([place]) 03/13/2017  . Anxiety   . Atrial fibrillation (HCC)     Brief episode of  . Atrial fibrillation with rapid ventricular response ([place]) 01/06/2016  . Cardiomyopathy, ischemic 10/27/2014  . CHF (congestive heart failure) ([place])   . COPD (chronic obstructive pulmonary disease) ([place])   . Coronary artery disease    Multivessel, S/p stenting to LCx. LHC 03/16/17: unchanged MVD- 50%mid LAD, 85% 1st daig, 100% prox circ, 100% prox RCA  . Depression   . Dyslipidemia   . Headache   . Hyperlipidemia   . Hypertension   . Myocardial infarction ([place])   . Peripheral vascular disease ([place])   . 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 Shortness of breath dyspnea   . Stroke ([place])   . Tobacco dependence   . Ventricular dysfunction    Mild Left   Past Surgical History: Past Surgical History:  Procedure Laterality Date  . ABDOMINAL  AORTIC ANEURYSM REPAIR  01/03/2016   Procedure: ANEURYSM ABDOMINAL AORTIC REPAIR USING 14MM X 8MM X40CM HEMASHIELD GOLD GRAFT;  Surgeon: Conrad [place], MD;  [facility] OR;  Service: Vascular;;  . ABDOMINAL AORTIC ENDOVASCULAR STENT GRAFT N/A 01/03/2016   Procedure: ABDOMINAL AORTIC ENDOVASCULAR STENT GRAFT IMPLANTED/EXPLANTED;  Surgeon: Conrad Omak, MD;  Location: [place];  Service: Vascular;  Laterality: N/A;  . AXILLARY-FEMORAL BYPASS GRAFT Bilateral 09/08/2017   Procedure: BYPASS GRAFT AXILLA-BIFEMORAL;  Surgeon: Rosetta Posner, MD;  Location: [place];  Service: Vascular;  Laterality: Bilateral;  . BACK SURGERY     cervical  . FALSE ANEURYSM REPAIR Bilateral 09/08/2017   Procedure: Bilateral Groin Exploration, Removal of Prosthetic graft. Vein Patch Closure of bilateral common femoral Arteries.;  Surgeon: Rosetta Posner, MD;  Location: [facility] OR;  Service: Vascular;  Laterality: Bilateral;  . HAND SURGERY Left    thumb surgery  . HERNIA REPAIR Right   . LEFT HEART CATH AND CORONARY ANGIOGRAPHY N/A 03/16/2017   Procedure: Left Heart Cath and Coronary Angiography;  Surgeon: Martinique, Peter M, MD;  Location: [place] CV LAB;  Service: Cardiovascular;  Laterality: N/A;  . LEFT HEART CATHETERIZATION WITH CORONARY ANGIOGRAM N/A 10/27/2014   Procedure: LEFT HEART CATHETERIZATION WITH CORONARY ANGIOGRAM;  Surgeon: Blane Ohara, MD;  Location: [facility] CATH LAB;  Service: Cardiovascular;  Laterality: N/A;  . NECK SURGERY    . stent     pt unsure of location  . US ECHOCARDIOGRAPHY  12-26-2008  EF 40-45%  . WRIST SURGERY Right    Social History: Social History   Socioeconomic History  . Marital status: Widowed    Spouse name: Not on file  . Number of children: 1  . Years of education: Not on file  . Highest education level: Not on file  Occupational History  . Not on file  Social Needs  . Financial resource strain: Not on file  . Food insecurity:    Worry: Not on file    Inability: Not on file  .  Transportation needs:    Medical: Not on file    Non-medical: Not on file  Tobacco Use  . Smoking status: Current Every Day Smoker    Packs/day: 0.50    Years: 20.00    Pack years: 10.00    Types: Cigarettes  . Smokeless tobacco: Never Used  . Tobacco comment: cutting back  Substance and Sexual Activity  . Alcohol use: No  . Drug use: No  . Sexual activity: Not Currently    Birth control/protection: None  Lifestyle  . Physical activity:    Days per week: Not on file    Minutes per session: Not on file  . Stress: Not on file  Relationships  . Social connections:    Talks on phone: Not on file    Gets together: Not on file    Attends religious service: Not on file    Active member of club or organization: Not on file    Attends meetings of clubs or organizations: Not on file    Relationship status: Not on file  Other Topics Concern  . Not on file  Social History Narrative   ** Merged History Encounter **       Family History: Family History  Problem Relation Age of Onset  . AAA (abdominal aortic aneurysm) Mother   . Cancer Father        bone marrow   Allergies: No Known Allergies Medications: See med rec.  Review of Systems: No fevers, chills, night sweats, weight loss, chest pain, or shortness of breath.   Objective:    General: Well Developed, well nourished, and in no acute distress.  Neuro: Alert and oriented x3, extra-ocular muscles intact, sensation grossly intact.  HEENT: Normocephalic, atraumatic, pupils equal round reactive to light, neck supple, no masses, no lymphadenopathy, thyroid nonpalpable.  Skin: Warm and dry, no rashes. Cardiac: Regular rate and rhythm, no murmurs rubs or gallops, no lower extremity edema.  Respiratory: Clear to auscultation bilaterally. Not using accessory muscles, speaking in full sentences.  Impression and Recommendations:    Anxiety and depression Not really tolerating Cymbalta. He will do 30 mg daily for a week and  then stop. We are also going to start an up taper on venlafaxine. Having difficulty sleeping which I think is secondary to his depression so adding trazodone 50 at bedtime.  Cervical spondylosis Severe neck pain with radiation down the right arm. We do have an MRI, at this point we are going to proceed with a right C6-C7 interlaminar epidural. Return to see me 1 month after injection to evaluate relief. Refilling muscle relaxer in the meantime.  Microcytic anemia Continued improvements, hemoglobin is up to 12.7 from 10.5 at the last visit 9.8 before that. Continue aggressive iron supplementation 3 times daily.  Dysphagia Mostly with pills. Sometimes with liquids, adding a modified barium swallow evaluation.  I spent 40 minutes with this patient, greater than 50% was face-to-face time counseling regarding the above  diagnoses, and specifically what to look forward to with regards to his cervical disease, anemia, depression and failure to thrive. ___________________________________________ Gwen Her. Dianah Field, M.D., ABFM., CAQSM. Primary Care and [place] Instructor of [place] of [facility] of Medicine

## 2018-05-14 NOTE — Assessment & Plan Note (Signed)
Severe neck pain with radiation down the right arm. We do have an MRI, at this point we are going to proceed with a right C6-C7 interlaminar epidural. Return to see me 1 month after injection to evaluate relief. Refilling muscle relaxer in the meantime.

## 2018-05-14 NOTE — Assessment & Plan Note (Signed)
Continued improvements, hemoglobin is up to 12.7 from 10.5 at the last visit 9.8 before that. Continue aggressive iron supplementation 3 times daily.

## 2018-05-14 NOTE — Assessment & Plan Note (Signed)
Mostly with pills. Sometimes with liquids, adding a modified barium swallow evaluation.

## 2018-05-14 NOTE — Assessment & Plan Note (Signed)
Not really tolerating Cymbalta. He will do 30 mg daily for a week and then stop. We are also going to start an up taper on venlafaxine. Having difficulty sleeping which I think is secondary to his depression so adding trazodone 50 at bedtime.

## 2018-05-17 ENCOUNTER — Other Ambulatory Visit (HOSPITAL_COMMUNITY): Payer: Self-pay | Admitting: Sports Medicine

## 2018-05-17 DIAGNOSIS — R1310 Dysphagia, unspecified: Secondary | ICD-10-CM

## 2018-05-21 ENCOUNTER — Other Ambulatory Visit: Payer: Self-pay | Admitting: Sports Medicine

## 2018-05-26 ENCOUNTER — Ambulatory Visit (HOSPITAL_COMMUNITY)
Admission: RE | Admit: 2018-05-26 | Discharge: 2018-05-26 | Disposition: A | Discharge status: Home / Self Care | Payer: Medicare Other | Source: Ambulatory Visit | Attending: Sports Medicine | Admitting: Sports Medicine

## 2018-05-26 DIAGNOSIS — R1310 Dysphagia, unspecified: Secondary | ICD-10-CM

## 2018-05-26 DIAGNOSIS — R1313 Dysphagia, pharyngeal phase: Secondary | ICD-10-CM | POA: Diagnosis not present

## 2018-05-26 DIAGNOSIS — I714 Abdominal aortic aneurysm, without rupture: Secondary | ICD-10-CM | POA: Diagnosis not present

## 2018-05-26 NOTE — Progress Notes (Signed)
Modified Barium Swallow Progress Note  Patient Details  Name: [name] MRN: [id number] Date of Birth: 12-20-1945  Today's Date: 05/26/2018  Modified Barium Swallow completed.  Full report located under Chart Review in the Imaging Section.  Brief recommendations include the following:  Clinical Impression  Pt presents with mild pharyngeal dysphagia characterized by trace amounts of penetration and aspiration of thin liquids. Function appears consistent with FEES in 2018. Insufficient glottal competence resulted in consistent trace amounts of penetration of thin liquid during the swallow; sensation dependent on quantity of penetration. Throught study trace penetrates accumulated and were eventually aspirated just below glottis and cleared by cued throat clear.  Overall risk of significant aspiration low, pts appears to sufficiently protect airway. Note prominent CP bar, which may be the cause of pts complaint of pills sticking in his throat. Recommend continue with thin liquids and a regular diet and recommend utilize strategy of throat clearing throughout meals.     Swallow Evaluation Recommendations       SLP Diet Recommendations: Regular solids;Thin liquid   Liquid Administration via: Cup;Straw   Medication Administration: Crushed with puree(follow pills with bites of puree if whole)   Supervision: Patient able to self feed   Compensations: Clear throat intermittently               Herbie [place], MA CCC-SLP [phone]  Lynann Beaver 05/26/2018,2:47 PM

## 2018-06-01 ENCOUNTER — Ambulatory Visit
Admission: RE | Admit: 2018-06-01 | Discharge: 2018-06-01 | Disposition: A | Discharge status: Home / Self Care | Payer: Medicare Other | Source: Ambulatory Visit | Attending: Sports Medicine | Admitting: Sports Medicine

## 2018-06-01 DIAGNOSIS — M47812 Spondylosis without myelopathy or radiculopathy, cervical region: Secondary | ICD-10-CM | POA: Diagnosis not present

## 2018-06-01 MED ORDER — IOPAMIDOL (ISOVUE-M 300) INJECTION 61%
1.0000 mL | Freq: Once | INTRAMUSCULAR | Status: AC | PRN
  Administered 2018-06-01: 1 mL via EPIDURAL

## 2018-06-01 MED ORDER — TRIAMCINOLONE ACETONIDE 40 MG/ML IJ SUSP (RADIOLOGY)
60.0000 mg | Freq: Once | INTRAMUSCULAR | Status: AC
  Administered 2018-06-01: 60 mg via EPIDURAL

## 2018-06-01 NOTE — Discharge Instructions (Signed)

## 2018-06-02 ENCOUNTER — Other Ambulatory Visit: Payer: Self-pay | Admitting: Sports Medicine

## 2018-06-02 ENCOUNTER — Other Ambulatory Visit: Payer: Self-pay | Admitting: Cardiovascular Disease

## 2018-06-02 ENCOUNTER — Other Ambulatory Visit: Payer: Self-pay | Admitting: Cardiology

## 2018-06-02 DIAGNOSIS — F419 Anxiety disorder, unspecified: Principal | ICD-10-CM

## 2018-06-02 DIAGNOSIS — F329 Major depressive disorder, single episode, unspecified: Secondary | ICD-10-CM

## 2018-06-02 DIAGNOSIS — F32A Depression, unspecified: Secondary | ICD-10-CM

## 2018-06-02 MED ORDER — NITROGLYCERIN 0.4 MG SL SUBL: 0.4000 mg | SUBLINGUAL_TABLET | SUBLINGUAL | 1 refills | Status: AC | PRN

## 2018-06-02 NOTE — Telephone Encounter (Addendum)
Xarelto 20mg  refill request received; pt is 72 yrs old, wt-54.4kg, Crea-0.87 on 03/03/18, last seen by Daune Perch on 04/09/17 (Dr. [name]), [place].82ml/min; pt needs an appt with Cardiologist since he is overdue.

## 2018-06-02 NOTE — Telephone Encounter (Signed)
Pt's medication was sent to pt's pharmacy as requested. Confirmation received.  °

## 2018-06-17 ENCOUNTER — Telehealth: Payer: Self-pay | Admitting: Internal Medicine

## 2018-06-17 DIAGNOSIS — J9611 Chronic respiratory failure with hypoxia: Secondary | ICD-10-CM

## 2018-06-17 DIAGNOSIS — J449 Chronic obstructive pulmonary disease, unspecified: Secondary | ICD-10-CM

## 2018-06-17 NOTE — Telephone Encounter (Signed)
Attempted to call pt. I did not receive an answer. I have left a message for pt to return our call.  

## 2018-06-17 NOTE — Telephone Encounter (Signed)
Attempted to call pt's daughter, Adonis Huguenin. I did not receive an answer. I have left a message for Katrina to return our call.

## 2018-06-17 NOTE — Telephone Encounter (Signed)
Pt daughter is returning call. Cb is [phone] ask for [name].

## 2018-06-18 NOTE — Telephone Encounter (Signed)
Called and spoke with pt's daughter Adonis Huguenin letting her know that they could call [employer] to have forms sent to them for Dr. Melvyn Novas to fill out his part to have pt's power taken care of first priority if power went out.  Per Katrina, they have already gotten the forms taken care of with [employer] but also stated at last OV, MW and pt had spoken about having portable O2 tanks for pt to have in case the power did go out, that way they would have all the materials needed until power was able to come back on.  Order has been placed for pt to receive portable o2 tanks.  Nothing further needed.

## 2018-06-22 ENCOUNTER — Ambulatory Visit: Payer: Medicare Other | Admitting: Cardiology

## 2018-06-25 ENCOUNTER — Other Ambulatory Visit: Payer: Self-pay | Admitting: Sports Medicine

## 2018-06-25 DIAGNOSIS — I5022 Chronic systolic (congestive) heart failure: Secondary | ICD-10-CM

## 2018-07-02 ENCOUNTER — Ambulatory Visit (INDEPENDENT_AMBULATORY_CARE_PROVIDER_SITE_OTHER): Payer: Medicare Other | Admitting: Sports Medicine

## 2018-07-02 ENCOUNTER — Encounter: Payer: Self-pay | Admitting: Sports Medicine

## 2018-07-02 DIAGNOSIS — F419 Anxiety disorder, unspecified: Secondary | ICD-10-CM

## 2018-07-02 DIAGNOSIS — M47812 Spondylosis without myelopathy or radiculopathy, cervical region: Secondary | ICD-10-CM | POA: Diagnosis not present

## 2018-07-02 DIAGNOSIS — K4090 Unilateral inguinal hernia, without obstruction or gangrene, not specified as recurrent: Secondary | ICD-10-CM

## 2018-07-02 DIAGNOSIS — F329 Major depressive disorder, single episode, unspecified: Secondary | ICD-10-CM | POA: Diagnosis not present

## 2018-07-02 DIAGNOSIS — F32A Depression, unspecified: Secondary | ICD-10-CM

## 2018-07-02 DIAGNOSIS — I6529 Occlusion and stenosis of unspecified carotid artery: Secondary | ICD-10-CM | POA: Diagnosis not present

## 2018-07-02 MED ORDER — VENLAFAXINE HCL ER 150 MG PO CP24: 150.0000 mg | ORAL_CAPSULE | Freq: Every day | ORAL | 3 refills | Status: DC

## 2018-07-02 MED ORDER — TIZANIDINE HCL 4 MG PO TABS: 4.0000 mg | ORAL_TABLET | Freq: Three times a day (TID) | ORAL | 11 refills | Status: DC | PRN

## 2018-07-02 MED ORDER — TRAZODONE HCL 100 MG PO TABS: 100.0000 mg | ORAL_TABLET | Freq: Every day | ORAL | 3 refills | Status: DC

## 2018-07-02 NOTE — Assessment & Plan Note (Signed)
Has been through several inguinal hernia repairs with mesh. Recurrence of the left foot feels to be direct inguinal hernia, it is reproducible and nontender without clinical evidence of obstruction or incarceration/strangulation. He is a relatively poor surgical risk at this moment but I wonder if his hernia repair could be done with epidural anesthesia.

## 2018-07-02 NOTE — Progress Notes (Signed)
Subjective:    CC: Follow-up multiple issues  HPI: Cervical radiculitis: Did not have any improvement from the epidural, not even temporary.  In addition he throughout his back.  Needs a refill on Zanaflex.  Seems to be very effective.  Hernia: History of multiple inguinal hernia repairs, mesh, he has noted an new protrusion in his left anterior pelvic region, nontender.  No change in bowel or bladder function.  Depression: Did not respond well to Cymbalta, has responded to some degree to Effexor, also has not yet noted increased sleep on trazodone but only on 50 mg.  I reviewed the past medical history, family history, social history, surgical history, and allergies today and no changes were needed.  Please see the problem list section below in epic for further details.  Past Medical History: Past Medical History:  Diagnosis Date  . AAA (abdominal aortic aneurysm) ([place])   . AAA (abdominal aortic aneurysm) without rupture ([place])   . Acute respiratory failure ([place]) 03/13/2017  . Anxiety   . Atrial fibrillation (HCC)     Brief episode of  . Atrial fibrillation with rapid ventricular response ([place]) 01/06/2016  . Cardiomyopathy, ischemic 10/27/2014  . CHF (congestive heart failure) ([place])   . COPD (chronic obstructive pulmonary disease) ([place])   . Coronary artery disease    Multivessel, S/p stenting to LCx. LHC 03/16/17: unchanged MVD- 50%mid LAD, 85% 1st daig, 100% prox circ, 100% prox RCA  . Depression   . Dyslipidemia   . Headache   . Hyperlipidemia   . Hypertension   . Myocardial infarction ([place])   . Peripheral vascular disease ([place])   . Renal artery dissection ([place])   . Shortness of breath dyspnea   . Stroke ([place])   . Tobacco dependence   . Ventricular dysfunction    Mild Left   Past Surgical History: Past Surgical History:  Procedure Laterality Date  . ABDOMINAL AORTIC ANEURYSM REPAIR  01/03/2016   Procedure: ANEURYSM ABDOMINAL AORTIC REPAIR USING 14MM X 8MM X40CM HEMASHIELD  GOLD GRAFT;  Surgeon: Conrad Hermantown, MD;  Location: [facility] OR;  Service: Vascular;;  . ABDOMINAL AORTIC ENDOVASCULAR STENT GRAFT N/A 01/03/2016   Procedure: ABDOMINAL AORTIC ENDOVASCULAR STENT GRAFT IMPLANTED/EXPLANTED;  Surgeon: Conrad Hartshorne, MD;  Location: [place];  Service: Vascular;  Laterality: N/A;  . AXILLARY-FEMORAL BYPASS GRAFT Bilateral 09/08/2017   Procedure: BYPASS GRAFT AXILLA-BIFEMORAL;  Surgeon: Rosetta Posner, MD;  Location: [place];  Service: Vascular;  Laterality: Bilateral;  . BACK SURGERY     cervical  . FALSE ANEURYSM REPAIR Bilateral 09/08/2017   Procedure: Bilateral Groin Exploration, Removal of Prosthetic graft. Vein Patch Closure of bilateral common femoral Arteries.;  Surgeon: Rosetta Posner, MD;  Location: [facility] OR;  Service: Vascular;  Laterality: Bilateral;  . HAND SURGERY Left    thumb surgery  . HERNIA REPAIR Right   . LEFT HEART CATH AND CORONARY ANGIOGRAPHY N/A 03/16/2017   Procedure: Left Heart Cath and Coronary Angiography;  Surgeon: Martinique, Peter M, MD;  Location: [facility];  Service: Cardiovascular;  Laterality: N/A;  . LEFT HEART CATHETERIZATION WITH CORONARY ANGIOGRAM N/A 10/27/2014   Procedure: LEFT HEART CATHETERIZATION WITH CORONARY ANGIOGRAM;  Surgeon: Blane Ohara, MD;  Location: [facility] CATH LAB;  Service: Cardiovascular;  Laterality: N/A;  . NECK SURGERY    . stent     pt unsure of location  . US ECHOCARDIOGRAPHY  12-26-2008   EF 40-45%  . WRIST SURGERY Right    Social History:  Social History   Socioeconomic History  . Marital status: Widowed    Spouse name: Not on file  . Number of children: 1  . Years of education: Not on file  . Highest education level: Not on file  Occupational History  . Not on file  Social Needs  . Financial resource strain: Not on file  . Food insecurity:    Worry: Not on file    Inability: Not on file  . Transportation needs:    Medical: Not on file    Non-medical: Not on file  Tobacco Use  . Smoking status: Current  Every Day Smoker    Packs/day: 0.50    Years: 20.00    Pack years: 10.00    Types: Cigarettes  . Smokeless tobacco: Never Used  . Tobacco comment: cutting back  Substance and Sexual Activity  . Alcohol use: No  . Drug use: No  . Sexual activity: Not Currently    Birth control/protection: None  Lifestyle  . Physical activity:    Days per week: Not on file    Minutes per session: Not on file  . Stress: Not on file  Relationships  . Social connections:    Talks on phone: Not on file    Gets together: Not on file    Attends religious service: Not on file    Active member of club or organization: Not on file    Attends meetings of clubs or organizations: Not on file    Relationship status: Not on file  Other Topics Concern  . Not on file  Social History Narrative   ** Merged History Encounter **       Family History: Family History  Problem Relation Age of Onset  . AAA (abdominal aortic aneurysm) Mother   . Cancer Father        bone marrow   Allergies: No Known Allergies Medications: See med rec.  Review of Systems: No fevers, chills, night sweats, weight loss, chest pain, or shortness of breath.   Objective:    General: Well Developed, well nourished, and in no acute distress.  Neuro: Alert and oriented x3, extra-ocular muscles intact, sensation grossly intact.  HEENT: Normocephalic, atraumatic, pupils equal round reactive to light, neck supple, no masses, no lymphadenopathy, thyroid nonpalpable.  Skin: Warm and dry, no rashes. Cardiac: Regular rate and rhythm, no murmurs rubs or gallops, no lower extremity edema.  Respiratory: Clear to auscultation bilaterally. Not using accessory muscles, speaking in full sentences. Abdomen: Soft, nontender, nondistended, normal bowel sounds, palpable left inguinal hernia, reducible, nontender.  Impression and Recommendations:    Cervical spondylosis Did not really get any improvement after a cervical epidural. Continuing  muscle relaxers for now. He is not a candidate for ACDF at this moment.  Anxiety and depression Not fully controlled but symptoms are better than when on Cymbalta. Increasing venlafaxine and doubling trazodone to 100 mg at bedtime. Return in 1 month for this.  Inguinal hernia, left Has been through several inguinal hernia repairs with mesh. Recurrence of the left foot feels to be direct inguinal hernia, it is reproducible and nontender without clinical evidence of obstruction or incarceration/strangulation. He is a relatively poor surgical risk at this moment but I wonder if his hernia repair could be done with epidural anesthesia. ___________________________________________ Gwen Her. Dianah Field, M.D., ABFM., CAQSM. Primary Care and Sports Medicine [facility]  Adjunct Professor of [employer]

## 2018-07-02 NOTE — Assessment & Plan Note (Signed)
Not fully controlled but symptoms are better than when on Cymbalta. Increasing venlafaxine and doubling trazodone to 100 mg at bedtime. Return in 1 month for this.

## 2018-07-02 NOTE — Assessment & Plan Note (Signed)
Did not really get any improvement after a cervical epidural. Continuing muscle relaxers for now. He is not a candidate for ACDF at this moment.

## 2018-07-05 ENCOUNTER — Ambulatory Visit (INDEPENDENT_AMBULATORY_CARE_PROVIDER_SITE_OTHER): Payer: Medicare Other | Admitting: Physician Assistant

## 2018-07-05 ENCOUNTER — Encounter: Payer: Self-pay | Admitting: Physician Assistant

## 2018-07-05 VITALS — BP 118/70 | HR 86 | Ht 66.0 in | Wt 121.4 lb

## 2018-07-05 DIAGNOSIS — I6529 Occlusion and stenosis of unspecified carotid artery: Secondary | ICD-10-CM

## 2018-07-05 DIAGNOSIS — I4819 Other persistent atrial fibrillation: Secondary | ICD-10-CM | POA: Diagnosis not present

## 2018-07-05 DIAGNOSIS — J449 Chronic obstructive pulmonary disease, unspecified: Secondary | ICD-10-CM | POA: Diagnosis not present

## 2018-07-05 DIAGNOSIS — Z9889 Other specified postprocedural states: Secondary | ICD-10-CM | POA: Diagnosis not present

## 2018-07-05 DIAGNOSIS — I5022 Chronic systolic (congestive) heart failure: Secondary | ICD-10-CM

## 2018-07-05 DIAGNOSIS — I25118 Atherosclerotic heart disease of native coronary artery with other forms of angina pectoris: Secondary | ICD-10-CM | POA: Diagnosis not present

## 2018-07-05 DIAGNOSIS — E785 Hyperlipidemia, unspecified: Secondary | ICD-10-CM

## 2018-07-05 DIAGNOSIS — Z8679 Personal history of other diseases of the circulatory system: Secondary | ICD-10-CM | POA: Diagnosis not present

## 2018-07-05 MED ORDER — ATORVASTATIN CALCIUM 80 MG PO TABS: 80.0000 mg | ORAL_TABLET | Freq: Every day | ORAL | 3 refills | Status: DC

## 2018-07-05 NOTE — Progress Notes (Signed)
Cardiology Office Note:    Date:  07/05/2018   ID:  [name], DOB March 18, 1946, MRN [id number]  PCP:  Silverio Decamp, MD  Cardiologist:  Mertie Moores, MD  Electrophysiologist:  None   Referring MD: Silverio Decamp,*   Chief Complaint  Patient presents with  . Follow-up    CAD, CHF, AFib    History of Present Illness:    [name] is a 72 y.o. male with coronary artery disease status post prior PCI of the LCx and RCA, systolic heart failure, atrial fibrillation, AAA status post repair in 2017, COPD, tobacco abuse, hyperlipidemia, anemia.  He previously was documented to have wide-complex tachycardia felt to be ventricular tachycardia.  He saw Dr. Caryl Comes and ICD implantation was recommended.  However, the patient declined.  He was admitted several times in July 2018.  He was admitted at one point with acute respiratory failure and was intubated.  He had elevated troponins and underwent cardiac catheterization.  This demonstrated an occluded proximal LCx and an occluded proximal RCA.  He had high-grade stenosis in the first diagonal and moderate nonobstructive disease in the mid LAD.  There were left-to-right collaterals.  It was felt that his elevated troponin was related to demand ischemia.  Medical therapy was recommended.  PCI of the diagonal was not recommended unless he had refractory angina.  He had inguinal hernia repair in January 2019.  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  He was most recently seen during an admission for decompensated heart failure in May 2019.  Echocardiogram during that admission demonstrated worsening LV function with an EF of 30-35%.  He has been maintained in sinus rhythm with amiodarone.  He was previously on anticoagulation with Rivaroxaban.  This was stopped secondary to worsening anemia earlier this year.  According to the patient, the source of  his bleeding has not been found.  His hemoglobin has improved.   He was last seen in this office in August 2018.     [name] returns for cardiology follow-up.  He is here with his daughter.  He lives with his son and is able to clean as well as walk out to the mailbox.  He is mainly limited by shortness of breath.  He denies orthopnea, PND, lower extremity swelling.  He does note chest discomfort with increased stress or overexertion.  He probably describes CCS class II-III symptoms.  He denies syncope.  He continues to smoke.  Prior CV studies:   The following studies were reviewed today:  Echocardiogram 01/20/2018 EF 30-35, akinesis of the entire lateral, inferolateral and inferior myocardium, aortic root 40 mm, ascending aorta 38 mm, mild MR, severe LAE  Cardiac catheterization 03/16/2017 1. 3 vessel obstructive CAD    - 50% mid LAD, 85 % first diagonal    - 100% proximal LCx    -100% proximal RCA 2. Severe LV dysfunction EF 25-35 3. Normal LVEDP Plan: study compared to prior cath films in February 2016 and there is no significant change. He has known ischemic cardiomyopathy. EDP is only 5 mm Hg so we will stop his IV lasix. He has done poorly with prior stents and has no active anginal symptoms. I suspect his elevated troponin is due to demand ischemia in setting of acute respiratory failure/ COPD. I would recommend aggressive medical therapy. I would not recommend PCI of the diagonal unless he has significant angina.   Echocardiogram  03/10/2017 Moderate concentric LVH, EF 40-45, moderate MR, severe LAE, mild RAE   Past Medical History:  Diagnosis Date  . AAA (abdominal aortic aneurysm) ([place])   . AAA (abdominal aortic aneurysm) without rupture ([place])   . Acute respiratory failure ([place]) 03/13/2017  . Anxiety   . Atrial fibrillation (HCC)     Brief episode of  . Atrial fibrillation with rapid ventricular response ([place]) 01/06/2016  . Cardiomyopathy, ischemic 10/27/2014  . CHF  (congestive heart failure) ([place])   . COPD (chronic obstructive pulmonary disease) ([place])   . Coronary artery disease    Multivessel, S/p stenting to LCx. LHC 03/16/17: unchanged MVD- 50%mid LAD, 85% 1st daig, 100% prox circ, 100% prox RCA  . Depression   . Dyslipidemia   . Headache   . Hyperlipidemia   . Hypertension   . Myocardial infarction ([place])   . Peripheral vascular disease ([place])   . Renal artery dissection ([place])   . Shortness of breath dyspnea   . Stroke ([place])   . Tobacco dependence   . Ventricular dysfunction    Mild Left   Surgical Hx: The patient  has a past surgical history that includes US ECHOCARDIOGRAPHY (12-26-2008); Back surgery; stent; left heart catheterization with coronary angiogram (N/A, 10/27/2014); Wrist surgery (Right); Hand surgery (Left); Hernia repair (Right); Abdominal aortic endovascular stent graft (N/A, 01/03/2016); Abdominal aortic aneurysm repair (01/03/2016); Neck surgery; LEFT HEART CATH AND CORONARY ANGIOGRAPHY (N/A, 03/16/2017); False aneurysm repair (Bilateral, 09/08/2017); and Axillary-femoral Bypass Graft (Bilateral, 09/08/2017).   Current Medications: Current Meds  Medication Sig  . albuterol (PROVENTIL HFA;VENTOLIN HFA) 108 (90 Base) MCG/ACT inhaler Inhale 1 puff into the lungs every 6 (six) hours as needed for wheezing or shortness of breath.  [name] albuterol (PROVENTIL) (2.5 MG/3ML) 0.083% nebulizer solution Take 3 mLs (2.5 mg total) by nebulization every 6 (six) hours as needed for wheezing or shortness of breath.  [name] amiodarone (PACERONE) 200 MG tablet TAKE 1 TABLET(200 MG) BY MOUTH DAILY  . aspirin EC 81 MG tablet Take 81 mg by mouth daily.  [name] atorvastatin (LIPITOR) 80 MG tablet Take 1 tablet (80 mg total) by mouth daily at 6 PM.  . cefUROXime (CEFTIN) 500 MG tablet Take 1 tablet (500 mg total) by mouth 2 (two) times daily with a meal.  . docusate sodium (COLACE) 100 MG capsule Take 1 capsule (100 mg total) by mouth 3 (three) times daily as needed.  .  doxycycline (VIBRA-TABS) 100 MG tablet Take 1 tablet (100 mg total) by mouth every 12 (twelve) hours.  [name] ENTRESTO 24-26 MG TAKE 1 TABLET BY MOUTH TWICE DAILY  . ferrous sulfate 325 (65 FE) MG EC tablet Take 1 tablet (325 mg total) by mouth 3 (three) times daily with meals.  . Fluticasone-Umeclidin-Vilant (TRELEGY ELLIPTA) 100-62.5-25 MCG/INH AEPB Inhale 1 puff into the lungs daily.  . folic acid (FOLVITE) 433 MCG tablet Take 400 mcg by mouth daily.  . furosemide (LASIX) 20 MG tablet TAKE 1 TABLET(20 MG) BY MOUTH DAILY  . nitroGLYCERIN (NITROSTAT) 0.4 MG SL tablet Place 1 tablet (0.4 mg total) under the tongue every 5 (five) minutes as needed for chest pain. Please keep upcoming appt in October. Thanks  . OXYGEN 2lpm with sleep  . polyethylene glycol (MIRALAX / GLYCOLAX) packet Take 17 g by mouth 2 (two) times daily. Until stooling regularly  . thiamine 100 MG tablet Take 1 tablet (100 mg total) by mouth daily.  [name] tiZANidine (ZANAFLEX) 4 MG tablet Take 1 tablet (4 mg  total) by mouth every 8 (eight) hours as needed for muscle spasms.  . traZODone (DESYREL) 100 MG tablet Take 1 tablet (100 mg total) by mouth at bedtime.  [name] venlafaxine XR (EFFEXOR-XR) 150 MG 24 hr capsule Take 1 capsule (150 mg total) by mouth daily with breakfast.  . [DISCONTINUED] atorvastatin (LIPITOR) 80 MG tablet Take 1 tablet (80 mg total) by mouth daily at 6 PM.     Allergies:   Patient has no known allergies.   Social History   Tobacco Use  . Smoking status: Current Every Day Smoker    Packs/day: 0.50    Years: 20.00    Pack years: 10.00    Types: Cigarettes  . Smokeless tobacco: Never Used  . Tobacco comment: cutting back  Substance Use Topics  . Alcohol use: No  . Drug use: No     Family Hx: The patient's family history includes AAA (abdominal aortic aneurysm) in his mother; Cancer in his father.  ROS:   Please see the history of present illness.    Review of Systems  Constitution: Positive for  malaise/fatigue and weight loss.  Cardiovascular: Positive for chest pain and dyspnea on exertion.  Respiratory: Positive for cough, shortness of breath and snoring.   Hematologic/Lymphatic: Bruises/bleeds easily.  Musculoskeletal: Positive for back pain.  Gastrointestinal: Positive for abdominal pain and constipation.  Neurological: Positive for dizziness.  Psychiatric/Behavioral: Positive for depression. The patient is nervous/anxious.    All other systems reviewed and are negative.   EKGs/Labs/Other Test Reviewed:    EKG:  EKG is  ordered today.  The ekg ordered today demonstrates normal sinus rhythm, heart rate 88, normal axis, LVH, nonspecific ST-T wave changes, QTC 452, similar to old tracings  Recent Labs: 01/18/2018: B Natriuretic Peptide 684.3; Magnesium 3.7 02/02/2018: Pro B Natriuretic peptide (BNP) 253.0 03/03/2018: ALT 13; BUN 26; Creat 0.87; Platelets 296; Potassium 4.4; Sodium 137; TSH 0.99 05/14/2018: Hemoglobin 12.7   Recent Lipid Panel Lab Results  Component Value Date/Time   CHOL 114 03/03/2018 10:30 AM   TRIG 130 03/03/2018 10:30 AM   HDL 44 03/03/2018 10:30 AM   CHOLHDL 2.6 03/03/2018 10:30 AM   LDLCALC 49 03/03/2018 10:30 AM   From KPN tool: Cholesterol, total 114.000 m 03/04/2018 HDL 44.000 mg 03/04/2018 LDL 49.000 mg 03/04/2018 Triglycerides 130.000 m 03/04/2018 A1C 5.700 01/20/2018 Hemoglobin 12.700 05/14/2018 Creatinine, Serum 0.870 03/03/2018 Potassium 4.400 mm 03/04/2018 ALT (SGPT) 13.000 U/L 03/04/2018 TSH 0.990 03/04/2018 BNP 253.000 02/02/2018 INR 1.030 01/18/2018 Platelets 296.000 T 03/04/2018   Physical Exam:    VS:  BP 118/70   Pulse 86   Ht 5\' 6"  (1.676 m)   Wt 121 lb 6.4 oz (55.1 kg)   BMI 19.59 kg/m     Wt Readings from Last 3 Encounters:  07/05/18 121 lb 6.4 oz (55.1 kg)  07/02/18 121 lb (54.9 kg)  05/14/18 120 lb (54.4 kg)     Physical Exam  Constitutional: He is oriented to person, place, and time. He appears well-developed and  well-nourished. No distress.  HENT:  Head: Normocephalic and atraumatic.  Eyes: No scleral icterus.  Neck: No JVD present. No thyromegaly present.  Cardiovascular: Normal rate and regular rhythm.  Murmur heard.  Systolic murmur is present with a grade of 2/6 at the lower left sternal border. Pulmonary/Chest: He has no wheezes. He has no rales.  Abdominal: Soft.  Musculoskeletal: He exhibits no edema.  Lymphadenopathy:    He has no cervical adenopathy.  Neurological: He is alert  and oriented to person, place, and time.  Skin: Skin is warm and dry.  Psychiatric: He has a normal mood and affect.    ASSESSMENT & PLAN:    Chronic systolic CHF (congestive heart failure) (HCC)  Secondary to ischemic cardiomyopathy.  EF 30-35.  NYHA 3a.  Volume status appears stable.  He does not weigh daily.  Beta-blocker and ARB were stopped last year due to hypotension.  He is now on Entresto.  We discussed the rationale for starting him on a beta-blocker.  We also discussed repeating his echocardiogram to assess for any improvement in LV function.  He and his daughter both do not want to change any of his medications because he seems to be fairly stable.  Luckily, amiodarone does have some beta-blocker properties.  Coronary artery disease of native artery of native heart with stable angina pectoris ([place]) History of prior PCI to the LCx and RCA.  Cardiac catheterization in July 2018 demonstrated an occluded LCx and RCA.  He has left right collaterals.  The 85% D1 stenosis was treated medically.  He currently has fairly stable anginal symptoms.  Given his prior history of orthostatic hypotension, I do not think that he would be able to tolerate long-acting nitrates.  As noted above, he and his daughter both do not want to change any of his medications.  Continue aspirin, statin.  Persistent atrial fibrillation Maintaining normal sinus rhythm on amiodarone.  Recent LFTs and TSH were normal.  As noted, he has had  significant anemia in the past and his anticoagulation was stopped.  CHADS2-VASc=4 (CHF, HTN, CAD, age x 1).  We discussed the risks and benefits of anticoagulation therapy.  He is at increased risk of stroke.  However, with his history of anemia, I would not resume anticoagulation.  Hyperlipidemia, unspecified hyperlipidemia type LDL optimal on most recent lab work.  Continue current Rx.    Atorvastatin will be refilled.  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  He is followed by infectious disease and vascular surgery.  He is on chronic antibiotic therapy.  COPD  GOLD III/ still smoking  He continues to smoke.  I have recommended that he quit.   Dispo:  Return in about 6 months (around 01/04/2019) for Routine Follow Up, w/ Dr. [name], or [name], PA-C.   Medication Adjustments/Labs and Tests Ordered: Current medicines are reviewed at length with the patient today.  Concerns regarding medicines are outlined above.  Tests Ordered: Orders Placed This Encounter  Procedures  . EKG 12-Lead   Medication Changes: Meds ordered this encounter  Medications  . atorvastatin (LIPITOR) 80 MG tablet    Sig: Take 1 tablet (80 mg total) by mouth daily at 6 PM.    Dispense:  90 tablet    Refill:  3    Signed, [name], PA-C  07/05/2018 5:07 PM    [name], [name], [name]  [id number] Phone: [phone]; Fax: [phone]

## 2018-07-05 NOTE — Patient Instructions (Signed)
Medication Instructions:  1. A REFILL HAS BEEN SENT IN LIPITOR  If you need a refill on your cardiac medications before your next appointment, please call your pharmacy.   Lab work: NONE ORDERED TODAY If you have labs (blood work) drawn today and your tests are completely normal, you will receive your results only by: [name] MyChart Message (if you have MyChart) OR . A paper copy in the mail If you have any lab test that is abnormal or we need to change your treatment, we will call you to review the results.  Testing/Procedures: NONE ORDERED TODAY  Follow-Up: At [facility], you and your health needs are our priority.  As part of our continuing mission to provide you with exceptional heart care, we have created designated Provider Care Teams.  These Care Teams include your primary Cardiologist (physician) and Advanced Practice Providers (APPs -  Physician Assistants and Nurse Practitioners) who all work together to provide you with the care you need, when you need it. You will need a follow up appointment in:  6 months.  Please call our office 2 months in advance to schedule this appointment.  You may see Mertie Moores, MD or one of the following Advanced Practice Providers on your designated Care Team: [name], PA-C [place], [place] . Daune Perch, NP  Any Other Special Instructions Will Be Listed Below (If Applicable).

## 2018-07-12 ENCOUNTER — Other Ambulatory Visit: Payer: Self-pay | Admitting: Sports Medicine

## 2018-07-12 DIAGNOSIS — F32A Depression, unspecified: Secondary | ICD-10-CM

## 2018-07-12 DIAGNOSIS — F329 Major depressive disorder, single episode, unspecified: Secondary | ICD-10-CM

## 2018-07-12 DIAGNOSIS — F419 Anxiety disorder, unspecified: Principal | ICD-10-CM

## 2018-07-13 ENCOUNTER — Ambulatory Visit: Payer: Medicare Other | Admitting: Internal Medicine

## 2018-07-15 ENCOUNTER — Telehealth: Payer: Self-pay | Admitting: *Deleted

## 2018-07-15 NOTE — Telephone Encounter (Signed)
Pt's daughter left vm letting us know that [facility] does not do epidural blocks for hernia repair surgery.  She said that her dad is having more pain from his hernia and wants to know what to do now.  Please advise.

## 2018-07-15 NOTE — Telephone Encounter (Signed)
Did they see the surgeon yet?

## 2018-07-19 ENCOUNTER — Encounter: Payer: Self-pay | Admitting: Internal Medicine

## 2018-07-19 ENCOUNTER — Ambulatory Visit (INDEPENDENT_AMBULATORY_CARE_PROVIDER_SITE_OTHER): Payer: Medicare Other | Admitting: Internal Medicine

## 2018-07-19 DIAGNOSIS — Z8719 Personal history of other diseases of the digestive system: Secondary | ICD-10-CM

## 2018-07-19 DIAGNOSIS — Z792 Long term (current) use of antibiotics: Secondary | ICD-10-CM | POA: Diagnosis not present

## 2018-07-19 DIAGNOSIS — Z7982 Long term (current) use of aspirin: Secondary | ICD-10-CM | POA: Diagnosis not present

## 2018-07-19 DIAGNOSIS — Z8619 Personal history of other infectious and parasitic diseases: Secondary | ICD-10-CM

## 2018-07-19 DIAGNOSIS — K5900 Constipation, unspecified: Secondary | ICD-10-CM | POA: Diagnosis not present

## 2018-07-19 DIAGNOSIS — F17210 Nicotine dependence, cigarettes, uncomplicated: Secondary | ICD-10-CM

## 2018-07-19 DIAGNOSIS — T827XXD Infection and inflammatory reaction due to other cardiac and vascular devices, implants and grafts, subsequent encounter: Secondary | ICD-10-CM

## 2018-07-19 NOTE — Assessment & Plan Note (Signed)
The only way we could know if his infection has been cured is to stop his antibiotics and wait 6 to 8 weeks to see what happens.  He is unwilling to do this, especially since he is tolerating the antibiotics well.  He will continue cefuroxime and doxycycline and follow-up in 6 months.  He knows to call me right away if he develops diarrhea or has other concerns about his antibiotics or infection.

## 2018-07-19 NOTE — Progress Notes (Signed)
[facility] for Infectious Disease  Patient Active Problem List   Diagnosis Date Noted  . 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 Inguinal hernia, left 07/02/2018  . Dysphagia with mild aspiration confirmed on modified barium swallow 05/14/2018  . Cervical spondylosis 04/16/2018  . Chronic respiratory failure with hypoxia ([place]) 04/06/2018  . Mixed hyperlipidemia 03/02/2018  . Benign essential hypertension 03/02/2018  . Anxiety and depression 03/02/2018  . DOE (dyspnea on exertion) 02/02/2018  . Community acquired pneumonia of right lower lobe of lung ([place])   . Postoperative groin pseudoaneurysm 09/08/2017  . PAD (peripheral artery disease) ([place]) 09/08/2017  . Microcytic anemia 03/19/2017  . Coagulase negative Staphylococcus bacteremia 03/16/2017  . Demand ischemia of myocardium: Severe 3 vessel CAD, with hypoxic respiratory failure 03/16/2017  . S/P AAA (abdominal aortic aneurysm) repair   . Cigarette smoker   . Systolic congestive heart failure ([place])   . COPD  GOLD III/ still smoking  10/15/2014  . Bronchiectasis with acute exacerbation ([place]) 10/15/2014  . Persistent atrial fibrillation   . 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   ALBUTEROL (PROVENTIL) (2.5 MG/3ML) 0.083% NEBULIZER SOLUTION    Take 3 mLs (2.5 mg total) by nebulization every 6 (six) hours as needed for wheezing or shortness of breath.   AMIODARONE (PACERONE) 200 MG TABLET    TAKE 1 TABLET(200 MG) BY MOUTH DAILY   ASPIRIN EC 81 MG TABLET    Take 81 mg by mouth daily.   ATORVASTATIN (LIPITOR) 80 MG TABLET    Take  1 tablet (80 mg total) by mouth daily at 6 PM.   CEFUROXIME (CEFTIN) 500 MG TABLET    Take 1 tablet (500 mg total) by mouth 2 (two) times daily with a meal.   DOCUSATE SODIUM (COLACE) 100 MG CAPSULE    Take 1 capsule (100 mg total) by mouth 3 (three) times daily as needed.   DOXYCYCLINE (VIBRA-TABS) 100 MG TABLET    Take 1 tablet (100 mg total) by mouth every 12 (twelve) hours.   ENTRESTO 24-26 MG    TAKE 1 TABLET BY MOUTH TWICE DAILY   FERROUS SULFATE 325 (65 FE) MG EC TABLET    Take 1 tablet (325 mg total) by mouth 3 (three) times daily with meals.   FLUTICASONE-UMECLIDIN-VILANT (TRELEGY ELLIPTA) 100-62.5-25 MCG/INH AEPB    Inhale 1 puff into the lungs daily.   FOLIC ACID (FOLVITE) 659 MCG TABLET    Take 400 mcg by mouth daily.   FUROSEMIDE (LASIX) 20 MG TABLET    TAKE 1 TABLET(20 MG) BY MOUTH DAILY   NITROGLYCERIN (NITROSTAT) 0.4 MG SL TABLET    Place 1 tablet (0.4 mg total) under the tongue every 5 (five) minutes as needed for chest pain. Please keep upcoming appt in October. Thanks   OXYGEN    2lpm with sleep   POLYETHYLENE GLYCOL (MIRALAX / GLYCOLAX) PACKET    Take 17 g by mouth 2 (two) times daily. Until stooling regularly   THIAMINE 100 MG TABLET    Take 1 tablet (100 mg total)  by mouth daily.   TIZANIDINE (ZANAFLEX) 4 MG TABLET    Take 1 tablet (4 mg total) by mouth every 8 (eight) hours as needed for muscle spasms.   TRAZODONE (DESYREL) 100 MG TABLET    Take 1 tablet (100 mg total) by mouth at bedtime.   TRAZODONE (DESYREL) 50 MG TABLET    TAKE 1 TABLET(50 MG) BY MOUTH AT BEDTIME   VENLAFAXINE XR (EFFEXOR-XR) 150 MG 24 HR CAPSULE    Take 1 capsule (150 mg total) by mouth daily with breakfast.  Modified Medications   No medications on file  Discontinued Medications   No medications on file    Subjective: Mr. Delay is in for his routine follow-up visit. He is a 72 y.o. male with multiple chronic medical conditions who underwent aortobifemoral bypass grafting in April 2017.   He had  been hospitalized last July with staph hominis bacteremia. He developed a ventral incisional hernia and was admitted to [facility] on 07/31/2017.  He underwent hernia repair.  He also had incidental incision and drainage of bilateral groin areas.  Purulence and necrosis was noted.  Operative Gram stain showed no organisms and cultures were negative.  He was discharged on empiric doxycycline.  He developed acute swelling of his right groin and was admitted here on New Year's Day. He was found to have an infected pseudoaneurysm.  He underwent emergent surgery with resection of both left and right limbs of the graft and bilateral axillary to femoral bypass grafts.  Operative Gram stain showed no organisms.  Cultures from his right groin were sterile.  Diphtheroids grew from his left groin cultures.  He was treated with empiric IV antibiotics in the hospital.  Rifampin was not used because he was on anticoagulants that have drug drug interactions with. His vascular surgeons discussed their concerns about possible infection of the retained aortic limb of his graft but he did not want to consider any further surgery.  He also refused to have a PICC placed for outpatient IV antibiotics.  I elected to discharge him on oral cefuroxime and doxycycline.  He is tolerating his antibiotics well.  He states that he is not having any fever, chills, sweats, nausea, vomiting or diarrhea.  He saw his vascular surgeon, Dr. Adele Barthel who ordered a tagged white blood cell scan because of ongoing concerns about persistent infection of the retained limb of his previous aortic graft.  That scan was normal.  He recently developed new swelling in his left groin and is concerned that he has a recurrent inguinal hernia.  Review of Systems: Review of Systems  Constitutional: Negative for chills, diaphoresis and fever.  Cardiovascular: Negative for chest pain.  Gastrointestinal: Positive for constipation. Negative for abdominal pain,  diarrhea, nausea and vomiting.    Past Medical History:  Diagnosis Date  . AAA (abdominal aortic aneurysm) ([place])   . AAA (abdominal aortic aneurysm) without rupture ([place])   . Acute respiratory failure ([place]) 03/13/2017  . Anxiety   . Atrial fibrillation (HCC)     Brief episode of  . Atrial fibrillation with rapid ventricular response ([place]) 01/06/2016  . Cardiomyopathy, ischemic 10/27/2014  . CHF (congestive heart failure) ([place])   . COPD (chronic obstructive pulmonary disease) ([place])   . Coronary artery disease    Multivessel, S/p stenting to LCx. LHC 03/16/17: unchanged MVD- 50%mid LAD, 85% 1st daig, 100% prox circ, 100% prox RCA  . Depression   . Dyslipidemia   . Headache   . Hyperlipidemia   .  Hypertension   . Myocardial infarction ([place])   . Peripheral vascular disease ([place])   . Renal artery dissection ([place])   . Shortness of breath dyspnea   . Stroke ([place])   . Tobacco dependence   . Ventricular dysfunction    Mild Left    Social History   Tobacco Use  . Smoking status: Current Every Day Smoker    Packs/day: 0.50    Years: 20.00    Pack years: 10.00    Types: Cigarettes  . Smokeless tobacco: Never Used  . Tobacco comment: cutting back  Substance Use Topics  . Alcohol use: No  . Drug use: No    Family History  Problem Relation Age of Onset  . AAA (abdominal aortic aneurysm) Mother   . Cancer Father        bone marrow    No Known Allergies  Objective: Vitals:   07/19/18 1546  BP: (!) 143/86  Pulse: 76  Temp: 98 F (36.7 C)  TempSrc: Oral  Weight: 121 lb (54.9 kg)   Body mass index is 19.53 kg/m.  Physical Exam  Constitutional: He is oriented to person, place, and time.  He is accompanied by his daughter.  Cardiovascular: Normal rate and regular rhythm.  No murmur heard. Pulmonary/Chest: Breath sounds normal. He has no rales.  Abdominal: He exhibits no distension. There is no tenderness.  His surgical incisions healed nicely. Bilateral axillofemoral  grafts are palpable and without signs of any infection.  He does have some swelling just above his left inguinal incision.  He is slightly tender with palpation.  He does appear to have a hernia that reduces easily with pressure.  Neurological: He is alert and oriented to person, place, and time. Gait normal.  Skin: No rash noted.  Psychiatric: Mood and affect normal.     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  He is unwilling to do this, especially since he is tolerating the antibiotics well.  He will continue cefuroxime and doxycycline and follow-up in 6 months.  He knows to call me right away if he develops diarrhea or has other concerns about his antibiotics or infection.          Michel Bickers, MD [facility] for [facility] [phone] pager   [phone] cell 07/19/2018, 4:16 PM

## 2018-07-24 ENCOUNTER — Other Ambulatory Visit: Payer: Self-pay | Admitting: Sports Medicine

## 2018-07-24 DIAGNOSIS — I5022 Chronic systolic (congestive) heart failure: Secondary | ICD-10-CM

## 2018-07-28 NOTE — Telephone Encounter (Signed)
They decided not to make an appt with the surgeon since they don't do the epidural blocks there.  They will discuss this more at his upcoming follow up visit.

## 2018-08-02 ENCOUNTER — Ambulatory Visit (INDEPENDENT_AMBULATORY_CARE_PROVIDER_SITE_OTHER): Payer: Medicare Other | Admitting: Sports Medicine

## 2018-08-02 ENCOUNTER — Encounter: Payer: Self-pay | Admitting: Sports Medicine

## 2018-08-02 DIAGNOSIS — F32A Depression, unspecified: Secondary | ICD-10-CM

## 2018-08-02 DIAGNOSIS — K4090 Unilateral inguinal hernia, without obstruction or gangrene, not specified as recurrent: Secondary | ICD-10-CM

## 2018-08-02 DIAGNOSIS — I6529 Occlusion and stenosis of unspecified carotid artery: Secondary | ICD-10-CM | POA: Diagnosis not present

## 2018-08-02 DIAGNOSIS — F419 Anxiety disorder, unspecified: Secondary | ICD-10-CM | POA: Diagnosis not present

## 2018-08-02 DIAGNOSIS — F329 Major depressive disorder, single episode, unspecified: Secondary | ICD-10-CM | POA: Diagnosis not present

## 2018-08-02 MED ORDER — VENLAFAXINE HCL ER 75 MG PO CP24: 225.0000 mg | ORAL_CAPSULE | Freq: Every day | ORAL | 3 refills | Status: DC

## 2018-08-02 MED ORDER — TRAZODONE HCL 100 MG PO TABS: 200.0000 mg | ORAL_TABLET | Freq: Every day | ORAL | 3 refills | Status: DC

## 2018-08-02 NOTE — Assessment & Plan Note (Signed)
Affect is less flat today. We are going to increase venlafaxine to a full 225 mg, he did not tolerate Cymbalta. Increasing trazodone as well to 200 mg at bedtime, return in 1 month.

## 2018-08-02 NOTE — Assessment & Plan Note (Signed)
Has been through several inguinal hernia repairs with mesh. Possible recurrence of left direct inguinal hernia, it is reproducible and nontender without clinical evidence of obstruction or incarceration/strangulation. I would like him to actually see 1 of our general surgeons face-to-face.

## 2018-08-02 NOTE — Progress Notes (Signed)
Subjective:    CC: Follow-up  HPI: Depression and anxiety: Slightly improved with 150 of Effexor and 100 of trazodone, able to sleep about 4 hours per night, feels less irritable, better affect today.  No suicidal or homicidal ideation.  I reviewed the past medical history, family history, social history, surgical history, and allergies today and no changes were needed.  Please see the problem list section below in epic for further details.  Past Medical History: Past Medical History:  Diagnosis Date  . AAA (abdominal aortic aneurysm) ([place])   . AAA (abdominal aortic aneurysm) without rupture ([place])   . Acute respiratory failure ([place]) 03/13/2017  . Anxiety   . Atrial fibrillation (HCC)     Brief episode of  . Atrial fibrillation with rapid ventricular response ([place]) 01/06/2016  . Cardiomyopathy, ischemic 10/27/2014  . CHF (congestive heart failure) ([place])   . COPD (chronic obstructive pulmonary disease) ([place])   . Coronary artery disease    Multivessel, S/p stenting to LCx. LHC 03/16/17: unchanged MVD- 50%mid LAD, 85% 1st daig, 100% prox circ, 100% prox RCA  . Depression   . Dyslipidemia   . Headache   . Hyperlipidemia   . Hypertension   . Myocardial infarction ([place])   . Peripheral vascular disease ([place])   . 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 Shortness of breath dyspnea   . Stroke ([place])   . Tobacco dependence   . Ventricular dysfunction    Mild Left   Past Surgical History: Past Surgical History:  Procedure Laterality Date  . ABDOMINAL AORTIC ANEURYSM REPAIR  01/03/2016   Procedure: ANEURYSM ABDOMINAL AORTIC REPAIR USING 14MM X 8MM X40CM HEMASHIELD GOLD GRAFT;  Surgeon: Conrad [place], MD;  Location: [facility] OR;  Service: Vascular;;  . ABDOMINAL AORTIC ENDOVASCULAR STENT GRAFT N/A 01/03/2016   Procedure: ABDOMINAL AORTIC ENDOVASCULAR STENT GRAFT IMPLANTED/EXPLANTED;  Surgeon: Conrad Gas, MD;  Location: [facility] [place];  Service: Vascular;  Laterality: N/A;  . AXILLARY-FEMORAL BYPASS GRAFT  Bilateral 09/08/2017   Procedure: BYPASS GRAFT AXILLA-BIFEMORAL;  Surgeon: Rosetta Posner, MD;  Location: [facility];  Service: Vascular;  Laterality: Bilateral;  . BACK SURGERY     cervical  . FALSE ANEURYSM REPAIR Bilateral 09/08/2017   Procedure: Bilateral Groin Exploration, Removal of Prosthetic graft. Vein Patch Closure of bilateral common femoral Arteries.;  Surgeon: Rosetta Posner, MD;  Location: [facility] OR;  Service: Vascular;  Laterality: Bilateral;  . HAND SURGERY Left    thumb surgery  . HERNIA REPAIR Right   . LEFT HEART CATH AND CORONARY ANGIOGRAPHY N/A 03/16/2017   Procedure: Left Heart Cath and Coronary Angiography;  Surgeon: Martinique, Peter M, MD;  Location: [facility] CV LAB;  Service: Cardiovascular;  Laterality: N/A;  . LEFT HEART CATHETERIZATION WITH CORONARY ANGIOGRAM N/A 10/27/2014   Procedure: LEFT HEART CATHETERIZATION WITH CORONARY ANGIOGRAM;  Surgeon: Blane Ohara, MD;  Location: [facility] CATH LAB;  Service: Cardiovascular;  Laterality: N/A;  . NECK SURGERY    . stent     pt unsure of location  . US ECHOCARDIOGRAPHY  12-26-2008   EF 40-45%  . WRIST SURGERY Right    Social History: Social History   Socioeconomic History  . Marital status: Widowed    Spouse name: Not on file  . Number of children: 1  . Years of education: Not on file  . Highest education level: Not on file  Occupational History  . Not on file  Social Needs  . Financial resource strain: Not on file  .  Food insecurity:    Worry: Not on file    Inability: Not on file  . Transportation needs:    Medical: Not on file    Non-medical: Not on file  Tobacco Use  . Smoking status: Current Every Day Smoker    Packs/day: 0.50    Years: 20.00    Pack years: 10.00    Types: Cigarettes  . Smokeless tobacco: Never Used  . Tobacco comment: cutting back  Substance and Sexual Activity  . Alcohol use: No  . Drug use: No  . Sexual activity: Not Currently    Birth control/protection: None  Lifestyle  . Physical  activity:    Days per week: Not on file    Minutes per session: Not on file  . Stress: Not on file  Relationships  . Social connections:    Talks on phone: Not on file    Gets together: Not on file    Attends religious service: Not on file    Active member of club or organization: Not on file    Attends meetings of clubs or organizations: Not on file    Relationship status: Not on file  Other Topics Concern  . Not on file  Social History Narrative   ** Merged History Encounter **       Family History: Family History  Problem Relation Age of Onset  . AAA (abdominal aortic aneurysm) Mother   . Cancer Father        bone marrow   Allergies: No Known Allergies Medications: See med rec.  Review of Systems: No fevers, chills, night sweats, weight loss, chest pain, or shortness of breath.   Objective:    General: Well Developed, well nourished, and in no acute distress.  Neuro: Alert and oriented x3, extra-ocular muscles intact, sensation grossly intact.  HEENT: Normocephalic, atraumatic, pupils equal round reactive to light, neck supple, no masses, no lymphadenopathy, thyroid nonpalpable.  Skin: Warm and dry, no rashes. Cardiac: Regular rate and rhythm, no murmurs rubs or gallops, no lower extremity edema.  Respiratory: Clear to auscultation bilaterally. Not using accessory muscles, speaking in full sentences.  Impression and Recommendations:    Anxiety and depression Affect is less flat today. We are going to increase venlafaxine to a full 225 mg, he did not tolerate Cymbalta. Increasing trazodone as well to 200 mg at bedtime, return in 1 month.  Inguinal hernia, left Has been through several inguinal hernia repairs with mesh. Possible recurrence of left direct inguinal hernia, it is reproducible and nontender without clinical evidence of obstruction or incarceration/strangulation. I would like him to actually see 1 of our general surgeons  face-to-face. ___________________________________________ Gwen Her. Dianah Field, M.D., ABFM., CAQSM. Primary Care and Sports Medicine [facility]  Adjunct Professor of [employer]

## 2018-08-08 ENCOUNTER — Other Ambulatory Visit: Payer: Self-pay | Admitting: Sports Medicine

## 2018-08-08 DIAGNOSIS — F329 Major depressive disorder, single episode, unspecified: Secondary | ICD-10-CM

## 2018-08-08 DIAGNOSIS — F419 Anxiety disorder, unspecified: Principal | ICD-10-CM

## 2018-08-08 DIAGNOSIS — F32A Depression, unspecified: Secondary | ICD-10-CM

## 2018-08-15 ENCOUNTER — Other Ambulatory Visit: Payer: Self-pay | Admitting: Sports Medicine

## 2018-08-19 DIAGNOSIS — K4091 Unilateral inguinal hernia, without obstruction or gangrene, recurrent: Secondary | ICD-10-CM | POA: Diagnosis not present

## 2018-08-19 DIAGNOSIS — I7090 Unspecified atherosclerosis: Secondary | ICD-10-CM | POA: Diagnosis not present

## 2018-08-19 DIAGNOSIS — F17200 Nicotine dependence, unspecified, uncomplicated: Secondary | ICD-10-CM | POA: Diagnosis not present

## 2018-08-25 ENCOUNTER — Other Ambulatory Visit: Payer: Self-pay | Admitting: Sports Medicine

## 2018-08-25 DIAGNOSIS — I5022 Chronic systolic (congestive) heart failure: Secondary | ICD-10-CM

## 2018-09-02 ENCOUNTER — Ambulatory Visit: Payer: Medicare Other | Admitting: Sports Medicine

## 2018-09-07 ENCOUNTER — Encounter: Payer: Self-pay | Admitting: Sports Medicine

## 2018-09-07 ENCOUNTER — Ambulatory Visit (INDEPENDENT_AMBULATORY_CARE_PROVIDER_SITE_OTHER): Payer: Medicare Other | Admitting: Sports Medicine

## 2018-09-07 DIAGNOSIS — F419 Anxiety disorder, unspecified: Secondary | ICD-10-CM | POA: Diagnosis not present

## 2018-09-07 DIAGNOSIS — I6529 Occlusion and stenosis of unspecified carotid artery: Secondary | ICD-10-CM | POA: Diagnosis not present

## 2018-09-07 DIAGNOSIS — F329 Major depressive disorder, single episode, unspecified: Secondary | ICD-10-CM | POA: Diagnosis not present

## 2018-09-07 DIAGNOSIS — F32A Depression, unspecified: Secondary | ICD-10-CM

## 2018-09-07 NOTE — Assessment & Plan Note (Addendum)
He did improvements in weight, smiling during the exam. Improvement in PHQ 9 score. Continue Effexor 225 mg, trazodone at 200 mg at bedtime has provided sleeping nearly through the night, he does wake up a couple of times to void. At this point I think we can leave his psychotropic medications alone. His main concern is that he will be around long enough to see his family, we quelled some of his fears, he is going to take a drive into [place] to see family. The elevation changes are not enough for him to need oxygen, he really will not be above 3000 to 3500 feet.

## 2018-09-07 NOTE — Progress Notes (Signed)
Subjective:    CC: Follow-up  HPI: Anxiety and depression: Improved considerably with the increase to 225 mg of Effexor, I increased his trazodone to 200 mg and he is sleeping for the most part through the night, waking up a couple of times to void.  He is happy with how things have gone, he is concerned that he will not be around long enough to see his family members.  Physically though he feels improved, his energy level is better, he is able to walk from the parking lot to the office without any shortness of breath like usual.  He is able to drive now, his ultimate goal is to driving to [place] to visit his family.  I reviewed the past medical history, family history, social history, surgical history, and allergies today and no changes were needed.  Please see the problem list section below in epic for further details.  Past Medical History: Past Medical History:  Diagnosis Date  . AAA (abdominal aortic aneurysm) ([place])   . AAA (abdominal aortic aneurysm) without rupture ([place])   . Acute respiratory failure ([place]) 03/13/2017  . Anxiety   . Atrial fibrillation (HCC)     Brief episode of  . Atrial fibrillation with rapid ventricular response ([place]) 01/06/2016  . Cardiomyopathy, ischemic 10/27/2014  . CHF (congestive heart failure) ([place])   . COPD (chronic obstructive pulmonary disease) ([place])   . Coronary artery disease    Multivessel, S/p stenting to LCx. LHC 03/16/17: unchanged MVD- 50%mid LAD, 85% 1st daig, 100% prox circ, 100% prox RCA  . Depression   . Dyslipidemia   . Headache   . Hyperlipidemia   . Hypertension   . Myocardial infarction ([place])   . Peripheral vascular disease ([place])   . 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 Shortness of breath dyspnea   . Stroke ([place])   . Tobacco dependence   . Ventricular dysfunction    Mild Left   Past Surgical History: Past Surgical History:  Procedure Laterality Date  . ABDOMINAL AORTIC ANEURYSM REPAIR  01/03/2016   Procedure: ANEURYSM  ABDOMINAL AORTIC REPAIR USING 14MM X 8MM X40CM HEMASHIELD GOLD GRAFT;  Surgeon: Conrad [place], MD;  Location: [facility] OR;  Service: Vascular;;  . ABDOMINAL AORTIC ENDOVASCULAR STENT GRAFT N/A 01/03/2016   Procedure: ABDOMINAL AORTIC ENDOVASCULAR STENT GRAFT IMPLANTED/EXPLANTED;  Surgeon: Conrad [place], MD;  Location: [place];  Service: Vascular;  Laterality: N/A;  . AXILLARY-FEMORAL BYPASS GRAFT Bilateral 09/08/2017   Procedure: BYPASS GRAFT AXILLA-BIFEMORAL;  Surgeon: Rosetta Posner, MD;  Location: [facility];  Service: Vascular;  Laterality: Bilateral;  . BACK SURGERY     cervical  . FALSE ANEURYSM REPAIR Bilateral 09/08/2017   Procedure: Bilateral Groin Exploration, Removal of Prosthetic graft. Vein Patch Closure of bilateral common femoral Arteries.;  Surgeon: Rosetta Posner, MD;  [facility] OR;  Service: Vascular;  Laterality: Bilateral;  . HAND SURGERY Left    thumb surgery  . HERNIA REPAIR Right   . LEFT HEART CATH AND CORONARY ANGIOGRAPHY N/A 03/16/2017   Procedure: Left Heart Cath and Coronary Angiography;  Surgeon: Martinique, Peter M, MD;  Location: [facility] CV LAB;  Service: Cardiovascular;  Laterality: N/A;  . LEFT HEART CATHETERIZATION WITH CORONARY ANGIOGRAM N/A 10/27/2014   Procedure: LEFT HEART CATHETERIZATION WITH CORONARY ANGIOGRAM;  Surgeon: Blane Ohara, MD;  Location: [facility] CATH LAB;  Service: Cardiovascular;  Laterality: N/A;  . NECK SURGERY    . stent     pt unsure of  location  . US ECHOCARDIOGRAPHY  12-26-2008   EF 40-45%  . WRIST SURGERY Right    Social History: Social History   Socioeconomic History  . Marital status: Widowed    Spouse name: Not on file  . Number of children: 1  . Years of education: Not on file  . Highest education level: Not on file  Occupational History  . Not on file  Social Needs  . Financial resource strain: Not on file  . Food insecurity:    Worry: Not on file    Inability: Not on file  . Transportation needs:    Medical: Not on file     Non-medical: Not on file  Tobacco Use  . Smoking status: Current Every Day Smoker    Packs/day: 0.50    Years: 20.00    Pack years: 10.00    Types: Cigarettes  . Smokeless tobacco: Never Used  . Tobacco comment: cutting back  Substance and Sexual Activity  . Alcohol use: No  . Drug use: No  . Sexual activity: Not Currently    Birth control/protection: None  Lifestyle  . Physical activity:    Days per week: Not on file    Minutes per session: Not on file  . Stress: Not on file  Relationships  . Social connections:    Talks on phone: Not on file    Gets together: Not on file    Attends religious service: Not on file    Active member of club or organization: Not on file    Attends meetings of clubs or organizations: Not on file    Relationship status: Not on file  Other Topics Concern  . Not on file  Social History Narrative   ** Merged History Encounter **       Family History: Family History  Problem Relation Age of Onset  . AAA (abdominal aortic aneurysm) Mother   . Cancer Father        bone marrow   Allergies: No Known Allergies Medications: See med rec.  Review of Systems: No fevers, chills, night sweats, weight loss, chest pain, or shortness of breath.   Objective:    General: Well Developed, well nourished, and in no acute distress.  Neuro: Alert and oriented x3, extra-ocular muscles intact, sensation grossly intact.  HEENT: Normocephalic, atraumatic, pupils equal round reactive to light, neck supple, no masses, no lymphadenopathy, thyroid nonpalpable.  Skin: Warm and dry, no rashes. Cardiac: Regular rate and rhythm, no murmurs rubs or gallops, no lower extremity edema.  Respiratory: Clear to auscultation bilaterally. Not using accessory muscles, speaking in full sentences.  Impression and Recommendations:    Anxiety and depression He did improvements in weight, smiling during the exam. Improvement in PHQ 9 score. Continue Effexor 225 mg, trazodone at  200 mg at bedtime has provided sleeping nearly through the night, he does wake up a couple of times to void. At this point I think we can leave his psychotropic medications alone. His main concern is that he will be around long enough to see his family, we quelled some of his fears, he is going to take a drive into [place] to see family. The elevation changes are not enough for him to need oxygen, he really will not be above 3000 to 3500 feet.  I spent 25 minutes with this patient, greater than 50% was face-to-face time counseling regarding the above diagnoses, specifically discussing his improvement in symptoms, as well as discussing other issues with his  daughter. ___________________________________________ Gwen Her. Dianah Field, M.D., ABFM., CAQSM. Primary Care and Sports Medicine [facility]  Adjunct Professor of [place] [employer]

## 2018-09-10 ENCOUNTER — Other Ambulatory Visit: Payer: Self-pay | Admitting: Sports Medicine

## 2018-09-10 DIAGNOSIS — F32A Depression, unspecified: Secondary | ICD-10-CM

## 2018-09-10 DIAGNOSIS — F419 Anxiety disorder, unspecified: Principal | ICD-10-CM

## 2018-09-10 DIAGNOSIS — F329 Major depressive disorder, single episode, unspecified: Secondary | ICD-10-CM

## 2018-09-25 ENCOUNTER — Other Ambulatory Visit: Payer: Self-pay | Admitting: Sports Medicine

## 2018-09-25 DIAGNOSIS — I5022 Chronic systolic (congestive) heart failure: Secondary | ICD-10-CM

## 2018-10-23 ENCOUNTER — Other Ambulatory Visit: Payer: Self-pay | Admitting: Sports Medicine

## 2018-10-23 DIAGNOSIS — I5022 Chronic systolic (congestive) heart failure: Secondary | ICD-10-CM

## 2018-10-30 IMAGING — DX DG CHEST 2V
2 series · 2 of 2 positions shown · non-contrast
Comparison: 01/18/2018

CLINICAL DATA: Cough, congestion

EXAM:
CHEST - 2 VIEW

[chest pa]
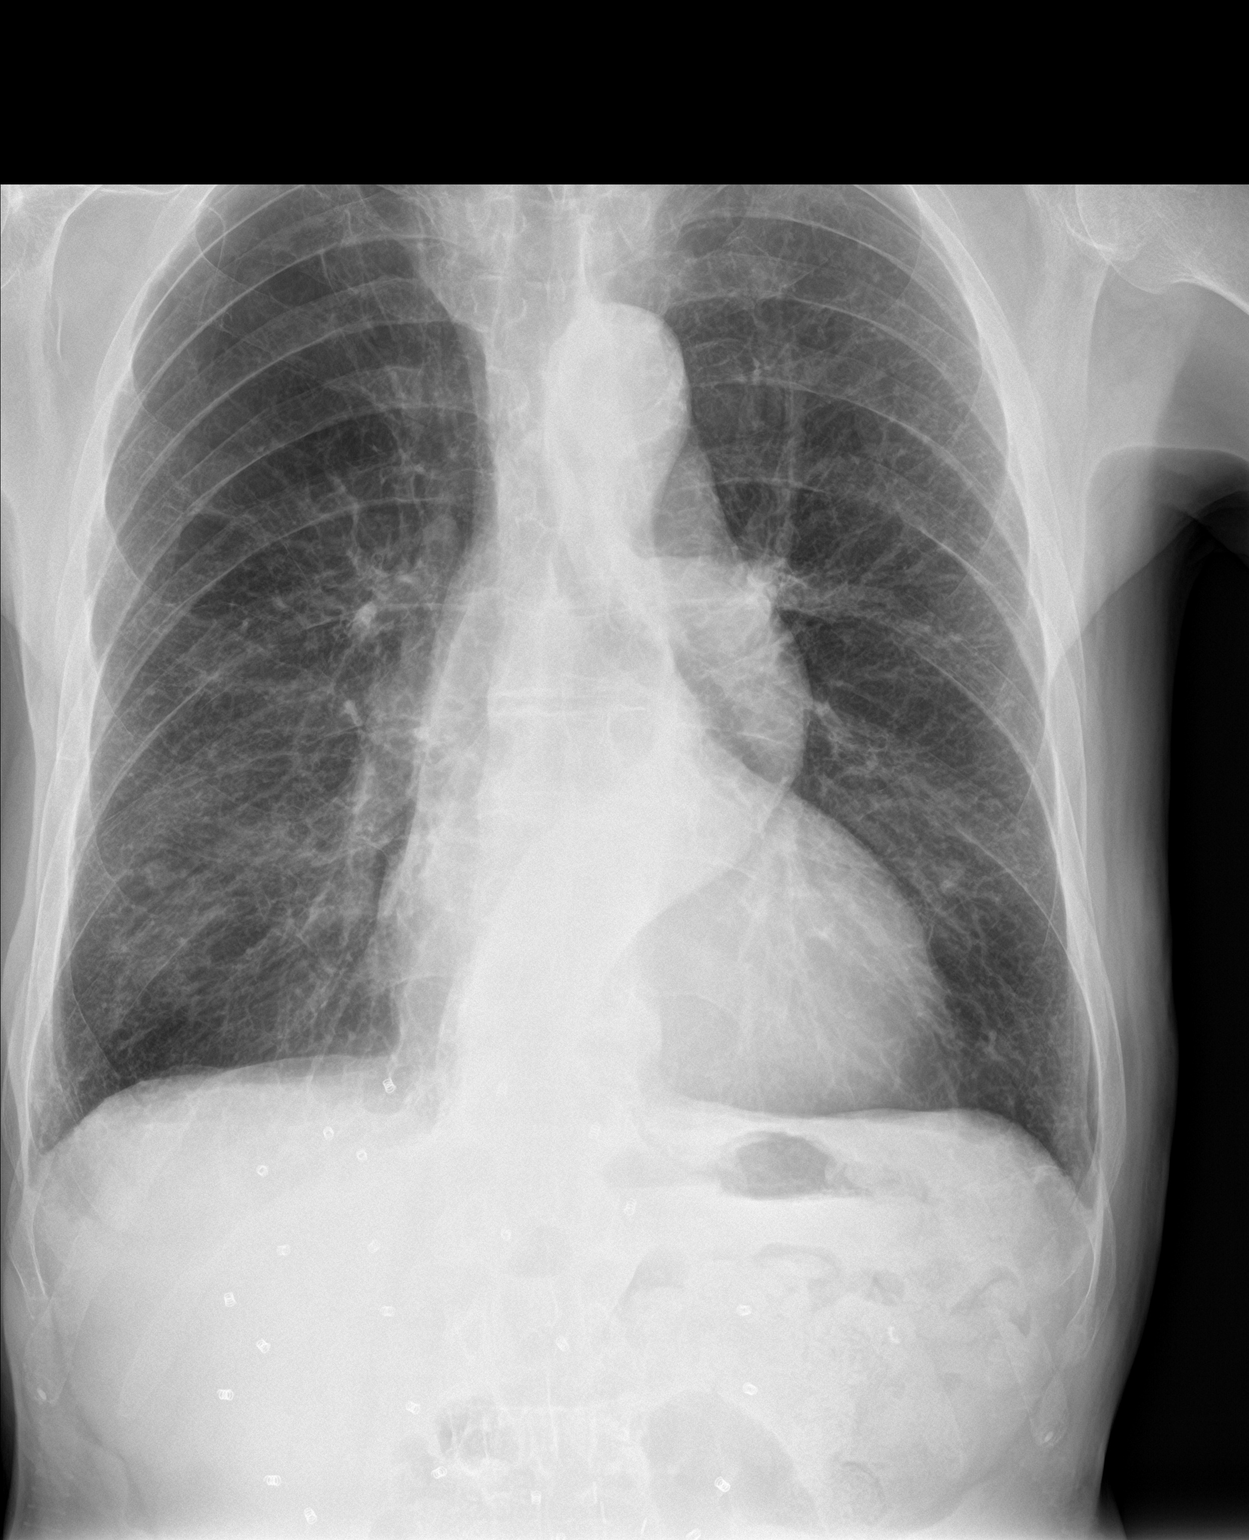

[chest lat]
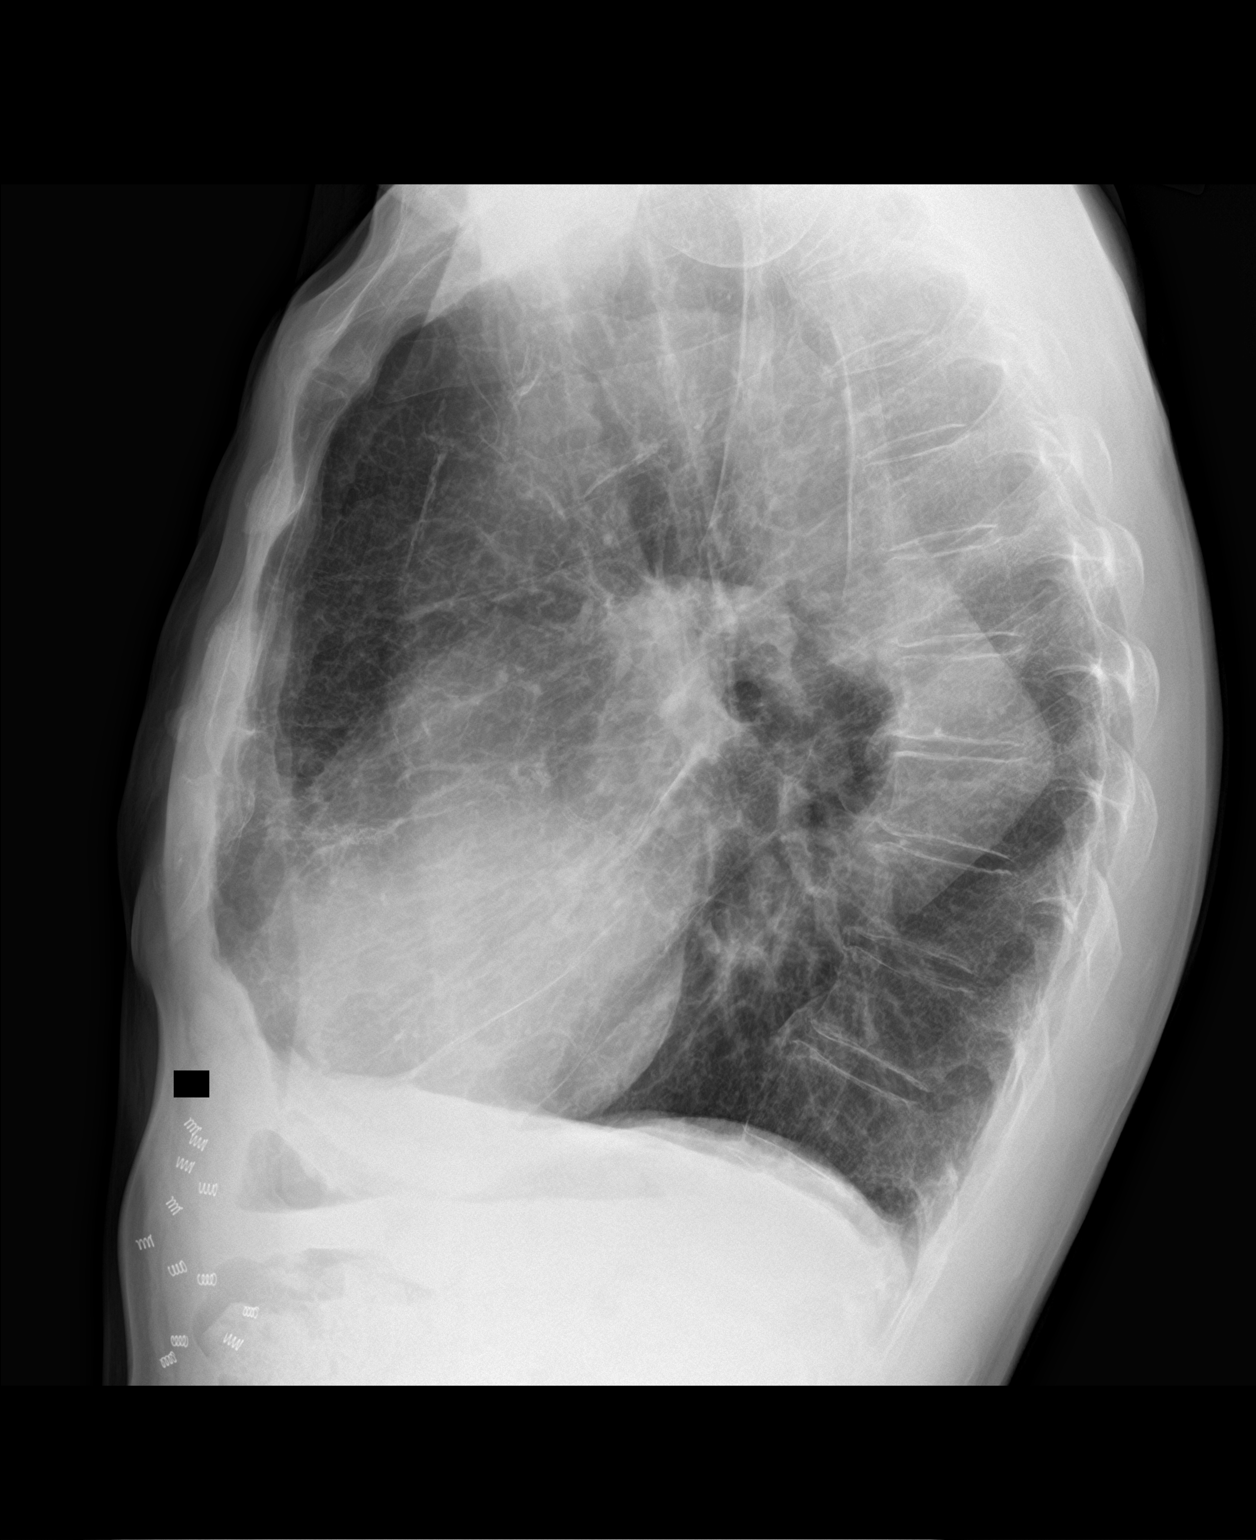

[2 of 2 positions shown; findings below may reference images not displayed]

FINDINGS: Marked tortuosity of the thoracic aorta. Heart is borderline in
size. There is hyperinflation of the lungs compatible with COPD. No
confluent opacities or effusions. No acute bony abnormality.
IMPRESSION: COPD.

Markedly tortuous aorta.  No visible aneurysm.

No active disease.

## 2018-11-19 ENCOUNTER — Other Ambulatory Visit: Payer: Self-pay | Admitting: Sports Medicine

## 2018-11-30 ENCOUNTER — Other Ambulatory Visit: Payer: Self-pay | Admitting: Sports Medicine

## 2018-11-30 ENCOUNTER — Other Ambulatory Visit: Payer: Self-pay

## 2018-11-30 DIAGNOSIS — F32A Depression, unspecified: Secondary | ICD-10-CM

## 2018-11-30 DIAGNOSIS — F329 Major depressive disorder, single episode, unspecified: Secondary | ICD-10-CM

## 2018-11-30 DIAGNOSIS — F419 Anxiety disorder, unspecified: Secondary | ICD-10-CM

## 2018-11-30 DIAGNOSIS — I5022 Chronic systolic (congestive) heart failure: Secondary | ICD-10-CM

## 2018-11-30 MED ORDER — VENLAFAXINE HCL ER 75 MG PO CP24: 225.0000 mg | ORAL_CAPSULE | Freq: Every day | ORAL | 0 refills | Status: DC

## 2018-11-30 MED ORDER — TRAZODONE HCL 100 MG PO TABS: 200.0000 mg | ORAL_TABLET | Freq: Every day | ORAL | 3 refills | Status: AC

## 2018-11-30 MED ORDER — SACUBITRIL-VALSARTAN 24-26 MG PO TABS: 1.0000 | ORAL_TABLET | Freq: Two times a day (BID) | ORAL | 0 refills | Status: DC

## 2018-12-07 ENCOUNTER — Ambulatory Visit (INDEPENDENT_AMBULATORY_CARE_PROVIDER_SITE_OTHER): Payer: Medicare Other | Admitting: Sports Medicine

## 2018-12-07 ENCOUNTER — Other Ambulatory Visit: Payer: Self-pay

## 2018-12-07 ENCOUNTER — Encounter: Payer: Self-pay | Admitting: Sports Medicine

## 2018-12-07 VITALS — BP 110/67 | HR 72 | Ht 66.0 in | Wt 119.0 lb

## 2018-12-07 DIAGNOSIS — F329 Major depressive disorder, single episode, unspecified: Secondary | ICD-10-CM | POA: Diagnosis not present

## 2018-12-07 DIAGNOSIS — E538 Deficiency of other specified B group vitamins: Secondary | ICD-10-CM

## 2018-12-07 DIAGNOSIS — R270 Ataxia, unspecified: Secondary | ICD-10-CM | POA: Diagnosis not present

## 2018-12-07 DIAGNOSIS — F419 Anxiety disorder, unspecified: Secondary | ICD-10-CM

## 2018-12-07 DIAGNOSIS — I10 Essential (primary) hypertension: Secondary | ICD-10-CM

## 2018-12-07 DIAGNOSIS — H6123 Impacted cerumen, bilateral: Secondary | ICD-10-CM | POA: Diagnosis not present

## 2018-12-07 DIAGNOSIS — F32A Depression, unspecified: Secondary | ICD-10-CM

## 2018-12-07 DIAGNOSIS — D509 Iron deficiency anemia, unspecified: Secondary | ICD-10-CM

## 2018-12-07 MED ORDER — BUSPIRONE HCL 5 MG PO TABS: 5.0000 mg | ORAL_TABLET | Freq: Three times a day (TID) | ORAL | 3 refills | Status: DC

## 2018-12-07 NOTE — Assessment & Plan Note (Addendum)
Continues to improve, he does well with Effexor 225, trazodone 200 at bedtime, he is sleeping nearly through the night. We are going to avoid benzos, and I am going to do my best to avoid medications like Abilify to augment his depression. His anxiety is still moderate to severe per his report, so adding BuSpar 5 mg 3 times daily. He declines behavioral therapy for now. Return to see me in 1 month to recheck PHQ/GAD, I offered an E-visit but he declines telephone and electronic visits, he prefers to do this in person.

## 2018-12-07 NOTE — Assessment & Plan Note (Addendum)
Rechecking iron indices today. He has been doing aggressive iron supplementation 3 times a day, he has come up from 9.8-10.5-12.7.  Anemia has resolved, B12 levels however are in the low limit of normal, he does have significant difficulty balancing and lack of proprioception so we will add methylmalonic acid levels to evaluate if this is actually a functional deficiency of vitamin B12.

## 2018-12-07 NOTE — Progress Notes (Addendum)
Subjective:    CC: Follow-up  HPI: Anxiety and depression: He is doing worlds better with Effexor 225, trazodone 200 at bedtime.  He still has some anxiety, declines behavioral therapy.  He would like to try another medication.  Anemia: Has been improving with iron supplementation.  Due to recheck.  Poor balance: Does not describe this is orthostatic, he feels as though the room is spinning, often when changing positions.  Does not feel as though he is going to pass out.  He also feels as though he does not know where his joints are in space when he moves.  I reviewed the past medical history, family history, social history, surgical history, and allergies today and no changes were needed.  Please see the problem list section below in epic for further details.  Past Medical History: Past Medical History:  Diagnosis Date  . AAA (abdominal aortic aneurysm) ([place])   . AAA (abdominal aortic aneurysm) without rupture ([place])   . Acute respiratory failure ([place]) 03/13/2017  . Anxiety   . Atrial fibrillation (HCC)     Brief episode of  . Atrial fibrillation with rapid ventricular response ([place]) 01/06/2016  . Cardiomyopathy, ischemic 10/27/2014  . CHF (congestive heart failure) ([place])   . COPD (chronic obstructive pulmonary disease) ([place])   . Coronary artery disease    Multivessel, S/p stenting to LCx. LHC 03/16/17: unchanged MVD- 50%mid LAD, 85% 1st daig, 100% prox circ, 100% prox RCA  . Depression   . Dyslipidemia   . Headache   . Hyperlipidemia   . Hypertension   . Myocardial infarction ([place])   . Peripheral vascular disease ([place])   . Renal artery dissection ([place])   . Shortness of breath dyspnea   . Stroke ([place])   . Tobacco dependence   . Ventricular dysfunction    Mild Left   Past Surgical History: Past Surgical History:  Procedure Laterality Date  . ABDOMINAL AORTIC ANEURYSM REPAIR  01/03/2016   Procedure: ANEURYSM ABDOMINAL AORTIC REPAIR USING 14MM X 8MM X40CM HEMASHIELD GOLD  GRAFT;  Surgeon: Conrad Glencoe, MD;  [facility]: [facility];  Service: Vascular;;  . ABDOMINAL AORTIC ENDOVASCULAR STENT GRAFT N/A 01/03/2016   Procedure: ABDOMINAL AORTIC ENDOVASCULAR STENT GRAFT IMPLANTED/EXPLANTED;  Surgeon: Conrad [place] Water, MD;  Location: [place];  Service: Vascular;  Laterality: N/A;  . AXILLARY-FEMORAL BYPASS GRAFT Bilateral 09/08/2017   Procedure: BYPASS GRAFT AXILLA-BIFEMORAL;  Surgeon: Rosetta Posner, MD;  Location: [place];  Service: Vascular;  Laterality: Bilateral;  . BACK SURGERY     cervical  . FALSE ANEURYSM REPAIR Bilateral 09/08/2017   Procedure: Bilateral Groin Exploration, Removal of Prosthetic graft. Vein Patch Closure of bilateral common femoral Arteries.;  Surgeon: Rosetta Posner, MD;  Location: [facility] OR;  Service: Vascular;  Laterality: Bilateral;  . HAND SURGERY Left    thumb surgery  . HERNIA REPAIR Right   . LEFT HEART CATH AND CORONARY ANGIOGRAPHY N/A 03/16/2017   Procedure: Left Heart Cath and Coronary Angiography;  Surgeon: Martinique, Peter M, MD;  Location: [place] CV LAB;  Service: Cardiovascular;  Laterality: N/A;  . LEFT HEART CATHETERIZATION WITH CORONARY ANGIOGRAM N/A 10/27/2014   Procedure: LEFT HEART CATHETERIZATION WITH CORONARY ANGIOGRAM;  Surgeon: Blane Ohara, MD;  Location: [facility] C[facility] LAB;  Service: Cardiovascular;  Laterality: N/A;  . NECK SURGERY    . stent     pt unsure of location  . US ECHOCARDIOGRAPHY  12-26-2008   EF 40-45%  . WRIST SURGERY Right  Social History: Social History   Socioeconomic History  . Marital status: Widowed    Spouse name: Not on file  . Number of children: 1  . Years of education: Not on file  . Highest education level: Not on file  Occupational History  . Not on file  Social Needs  . Financial resource strain: Not on file  . Food insecurity:    Worry: Not on file    Inability: Not on file  . Transportation needs:    Medical: Not on file    Non-medical: Not on file  Tobacco Use  . Smoking status: Current Every  Day Smoker    Packs/day: 0.50    Years: 20.00    Pack years: 10.00    Types: Cigarettes  . Smokeless tobacco: Never Used  . Tobacco comment: cutting back  Substance and Sexual Activity  . Alcohol use: No  . Drug use: No  . Sexual activity: Not Currently    Birth control/protection: None  Lifestyle  . Physical activity:    Days per week: Not on file    Minutes per session: Not on file  . Stress: Not on file  Relationships  . Social connections:    Talks on phone: Not on file    Gets together: Not on file    Attends religious service: Not on file    Active member of club or organization: Not on file    Attends meetings of clubs or organizations: Not on file    Relationship status: Not on file  Other Topics Concern  . Not on file  Social History Narrative   ** Merged History Encounter **       Family History: Family History  Problem Relation Age of Onset  . AAA (abdominal aortic aneurysm) Mother   . Cancer Father        bone marrow   Allergies: No Known Allergies Medications: See med rec.  Review of Systems: No fevers, chills, night sweats, weight loss, chest pain, or shortness of breath.   Objective:    General: Well Developed, well nourished, and in no acute distress.  Neuro: Alert and oriented x3, extra-ocular muscles intact, sensation grossly intact.  HEENT: Normocephalic, atraumatic, pupils equal round reactive to light, neck supple, no masses, no lymphadenopathy, thyroid nonpalpable.  Oropharynx, nasopharynx unremarkable, bilateral cerumen impactions. Skin: Warm and dry, no rashes. Cardiac: Regular rate and rhythm, no murmurs rubs or gallops, no lower extremity edema.  Respiratory: Clear to auscultation bilaterally. Not using accessory muscles, speaking in full sentences.  Indication: Cerumen impaction of the left and right ear(s) Medical necessity statement: On physical examination, cerumen impairs clinically significant portions of the external auditory  canal, and tympanic membrane. Noted obstructive, copious cerumen that cannot be removed without magnification and instrumentations requiring physician skills Consent: Discussed benefits and risks of procedure and verbal consent obtained Procedure: Patient was prepped for the procedure. Utilized an otoscope to assess and take note of the ear canal, the tympanic membrane, and the presence, amount, and placement of the cerumen. Gentle water irrigation and soft plastic curette was utilized to remove cerumen.  Post procedure examination: shows cerumen was completely removed. Patient tolerated procedure well. The patient is made aware that they may experience temporary vertigo, temporary hearing loss, and temporary discomfort. If these symptom last for more than 24 hours to call the clinic or proceed to the ED.  Impression and Recommendations:    Microcytic anemia Rechecking iron indices today. He has been doing aggressive  iron supplementation 3 times a day, he has come up from 9.8-10.5-12.7.  Anemia has resolved, B12 levels however are in the low limit of normal, he does have significant difficulty balancing and lack of proprioception so we will add methylmalonic acid levels to evaluate if this is actually a functional deficiency of vitamin B12.  Ataxia Unclear etiology. This may be vestibular in origin, it may be related to poor proprioception, he does endorse that he does not feel like he knows where his joints are in space. This also may be central. We are checking vitamin B12, I would also like both ear canals irrigated, he does have bilateral cerumen impactions. If all of the above fails we will do an MRI of his brain.  Anxiety and depression Continues to improve, he does well with Effexor 225, trazodone 200 at bedtime, he is sleeping nearly through the night. We are going to avoid benzos, and I am going to do my best to avoid medications like Abilify to augment his depression. His anxiety is  still moderate to severe per his report, so adding BuSpar 5 mg 3 times daily. He declines behavioral therapy for now. Return to see me in 1 month to recheck PHQ/GAD, I offered an E-visit but he declines telephone and electronic visits, he prefers to do this in person.   ___________________________________________ Gwen Her. Dianah Field, M.D., ABFM., CAQSM. Primary Care and Sports Medicine [facility]  Adjunct Professor of [place] [employer]

## 2018-12-07 NOTE — Assessment & Plan Note (Signed)
Unclear etiology. This may be vestibular in origin, it may be related to poor proprioception, he does endorse that he does not feel like he knows where his joints are in space. This also may be central. We are checking vitamin B12, I would also like both ear canals irrigated, he does have bilateral cerumen impactions. If all of the above fails we will do an MRI of his brain.

## 2018-12-08 LAB — CBC
HCT: 40.6 % (ref 38.5–50.0)
Hemoglobin: 13.6 g/dL (ref 13.2–17.1)
MCH: 31.8 pg (ref 27.0–33.0)
MCHC: 33.5 g/dL (ref 32.0–36.0)
MCV: 94.9 fL (ref 80.0–100.0)
MPV: 10.4 fL (ref 7.5–12.5)
Platelets: 210 Thousand/uL (ref 140–400)
RBC: 4.28 Million/uL (ref 4.20–5.80)
RDW: 13.1 % (ref 11.0–15.0)
WBC: 8.8 10*3/uL (ref 3.8–10.8)

## 2018-12-08 LAB — COMPREHENSIVE METABOLIC PANEL WITH GFR
AG Ratio: 1.3 (calc) (ref 1.0–2.5)
AST: 20 U/L (ref 10–35)
Albumin: 3.7 g/dL (ref 3.6–5.1)
Alkaline phosphatase (APISO): 93 U/L (ref 35–144)
CO2: 30 mmol/L (ref 20–32)
Creat: 0.8 mg/dL (ref 0.70–1.18)
Globulin: 2.9 g/dL (ref 1.9–3.7)
Potassium: 4.9 mmol/L (ref 3.5–5.3)
Total Bilirubin: 0.4 mg/dL (ref 0.2–1.2)

## 2018-12-08 LAB — IRON,TIBC AND FERRITIN PANEL
%SAT: 38 % (calc) (ref 20–48)
Ferritin: 119 ng/mL (ref 24–380)
Iron: 90 ug/dL (ref 50–180)
TIBC: 239 mcg/dL (calc) — ABNORMAL LOW (ref 250–425)

## 2018-12-08 LAB — COMPREHENSIVE METABOLIC PANEL
ALT: 14 U/L (ref 9–46)
BUN: 18 mg/dL (ref 7–25)
Calcium: 9.2 mg/dL (ref 8.6–10.3)
Chloride: 105 mmol/L (ref 98–110)
Glucose, Bld: 92 mg/dL (ref 65–99)
Sodium: 141 mmol/L (ref 135–146)
Total Protein: 6.6 g/dL (ref 6.1–8.1)

## 2018-12-08 LAB — RETICULOCYTES
ABS Retic: 29960 cells/uL (ref 25000–9000)
Retic Ct Pct: 0.7 %

## 2018-12-08 LAB — SPECIMEN COMPROMISED

## 2018-12-08 LAB — TSH: TSH: 1.5 m[IU]/L (ref 0.40–4.50)

## 2018-12-08 LAB — B12 AND FOLATE PANEL
Folate: 21.8 ng/mL
Vitamin B-12: 349 pg/mL (ref 200–1100)

## 2018-12-08 NOTE — Addendum Note (Signed)
Addended by: Silverio Decamp on: 12/08/2018 10:21 AM   Modules accepted: Orders

## 2018-12-10 ENCOUNTER — Other Ambulatory Visit: Payer: Self-pay | Admitting: Behavioral Health

## 2018-12-10 DIAGNOSIS — T827XXD Infection and inflammatory reaction due to other cardiac and vascular devices, implants and grafts, subsequent encounter: Secondary | ICD-10-CM

## 2018-12-10 DIAGNOSIS — F321 Major depressive disorder, single episode, moderate: Secondary | ICD-10-CM

## 2018-12-10 MED ORDER — CEFUROXIME AXETIL 500 MG PO TABS: 500.0000 mg | ORAL_TABLET | Freq: Two times a day (BID) | ORAL | 2 refills | Status: DC

## 2018-12-10 MED ORDER — DOXYCYCLINE HYCLATE 100 MG PO TABS: 100.0000 mg | ORAL_TABLET | Freq: Two times a day (BID) | ORAL | 2 refills | Status: DC

## 2018-12-13 ENCOUNTER — Ambulatory Visit: Payer: Medicare Other | Admitting: Internal Medicine

## 2018-12-21 ENCOUNTER — Other Ambulatory Visit: Payer: Self-pay | Admitting: Adult Health

## 2018-12-21 NOTE — Telephone Encounter (Signed)
DENIED - ipratropium neb solution was d/c'd at the 7.29.2019 office visit with Dr Melvyn Novas

## 2018-12-28 ENCOUNTER — Other Ambulatory Visit: Payer: Self-pay | Admitting: Sports Medicine

## 2018-12-28 DIAGNOSIS — I5022 Chronic systolic (congestive) heart failure: Secondary | ICD-10-CM

## 2018-12-28 MED ORDER — SACUBITRIL-VALSARTAN 24-26 MG PO TABS: 1.0000 | ORAL_TABLET | Freq: Two times a day (BID) | ORAL | 3 refills | Status: DC

## 2018-12-31 ENCOUNTER — Other Ambulatory Visit: Payer: Self-pay | Admitting: Sports Medicine

## 2018-12-31 DIAGNOSIS — F329 Major depressive disorder, single episode, unspecified: Secondary | ICD-10-CM

## 2018-12-31 DIAGNOSIS — F419 Anxiety disorder, unspecified: Principal | ICD-10-CM

## 2018-12-31 DIAGNOSIS — F32A Depression, unspecified: Secondary | ICD-10-CM

## 2018-12-31 MED ORDER — VENLAFAXINE HCL ER 75 MG PO CP24: 225.0000 mg | ORAL_CAPSULE | Freq: Every day | ORAL | 3 refills | Status: DC

## 2019-01-04 ENCOUNTER — Ambulatory Visit: Payer: Medicare Other | Admitting: Sports Medicine

## 2019-01-28 IMAGING — MR MR CERVICAL SPINE W/O CM
5 series · 34 of 48 positions shown · non-contrast
Comparison: MRI cervical spine 10/05/2014.

CLINICAL DATA: Chronic neck pain radiating into the right arm with
pain and numbness in the right hand. Symptoms have worsened over the
past 2 months.

EXAM:
MRI CERVICAL SPINE WITHOUT CONTRAST
TECHNIQUE: Multiplanar, multisequence MR imaging of the cervical spine was
performed. No intravenous contrast was administered.

[Series 3: T2 · sagittal · 3.0mm · 0.59mm/px · 7 of 15 slices shown (1 of 2)]
[im 1/15]
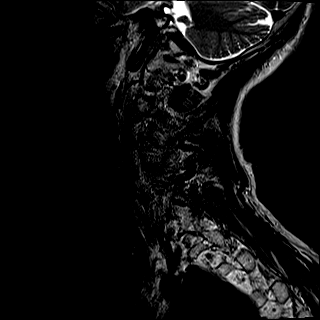
[im 3/15]
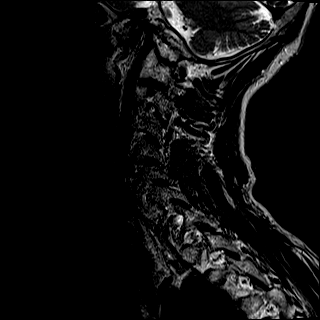
[im 5/15]
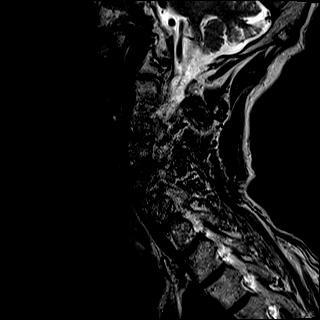
[im 8/15]
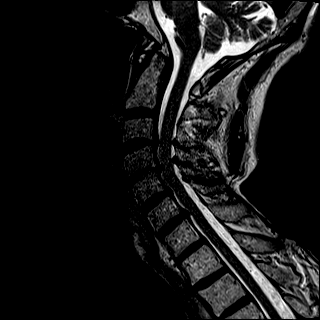
[im 10/15]
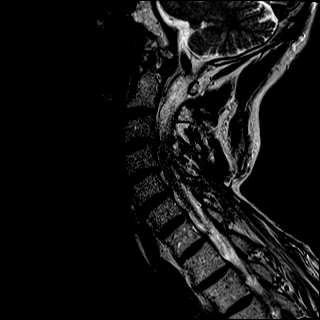
[im 12/15]
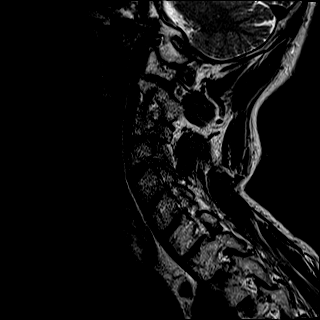
[im 15/15]
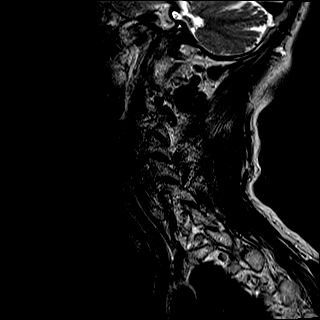

[Series 4: STIR · sagittal · 3.0mm · 0.37mm/px · 7 of 15 slices shown]
[im 1/15]
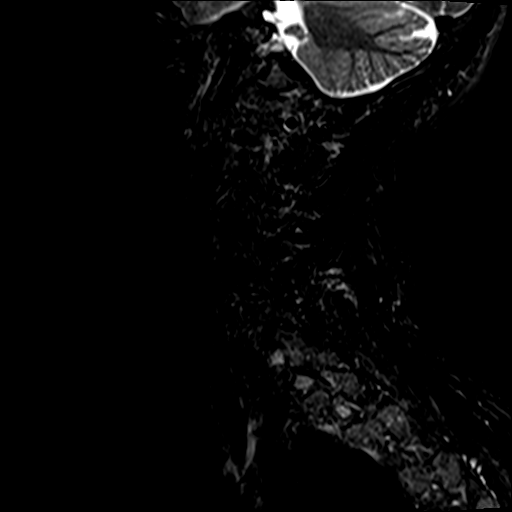
[im 3/15]
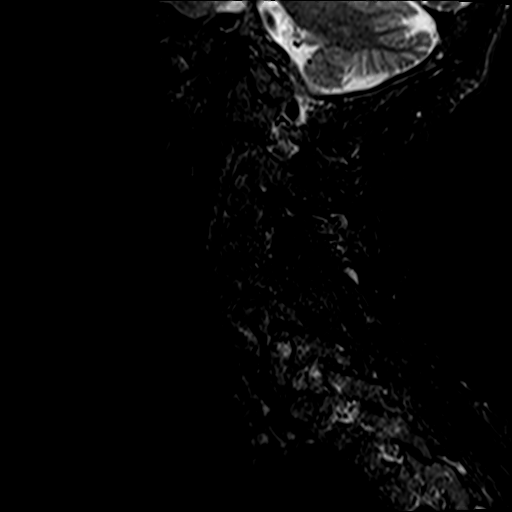
[im 5/15]
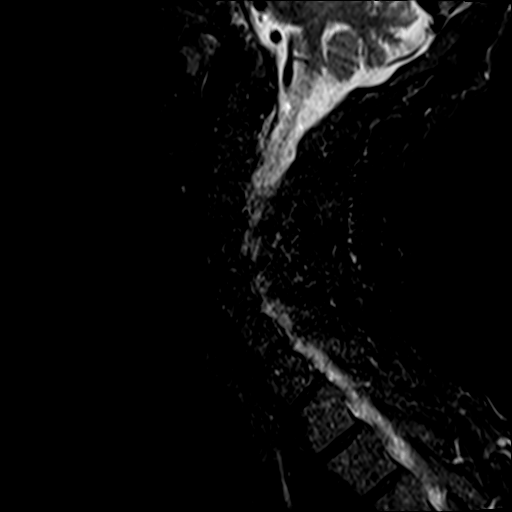
[im 8/15]
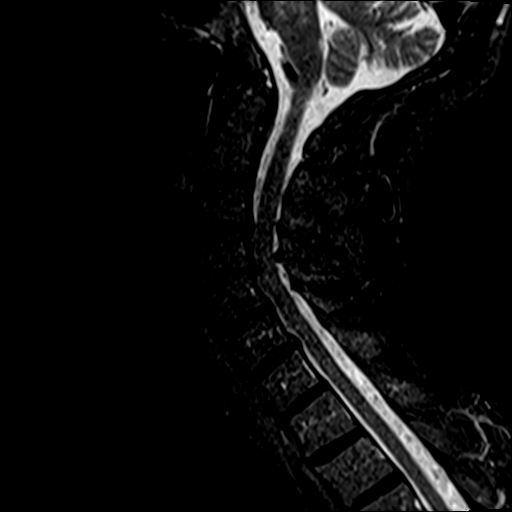
[im 10/15]
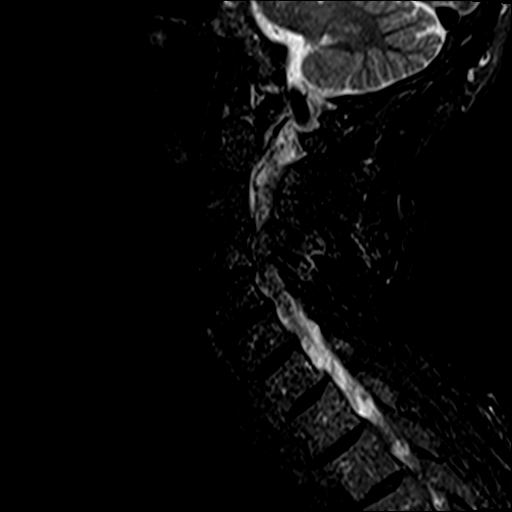
[im 12/15]
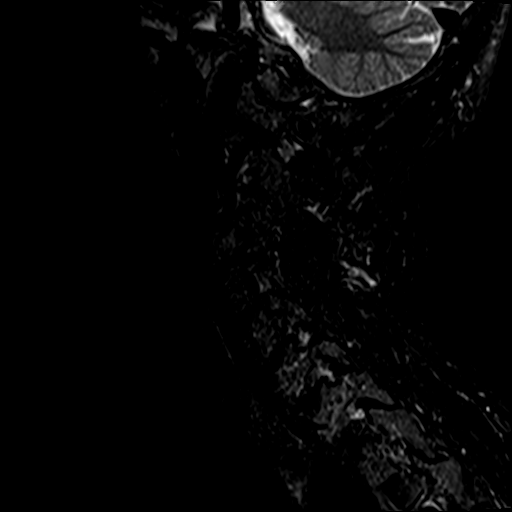
[im 15/15]
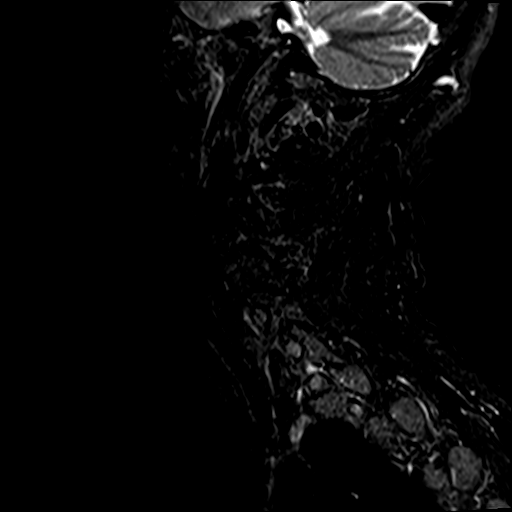

[Series 5: T1 · sagittal · 3.0mm · 0.74mm/px · 7 of 15 slices shown]
[im 1/15]
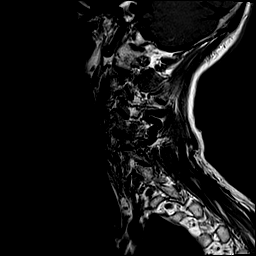
[im 3/15]
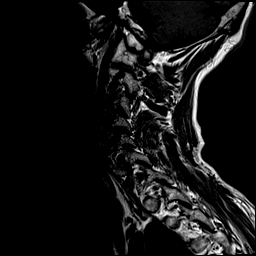
[im 5/15]
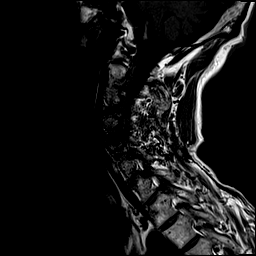
[im 8/15]
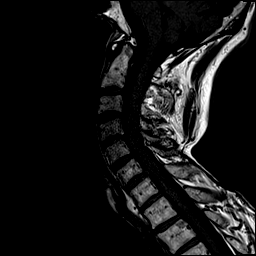
[im 10/15]
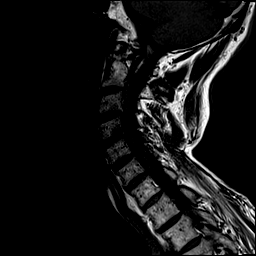
[im 12/15]
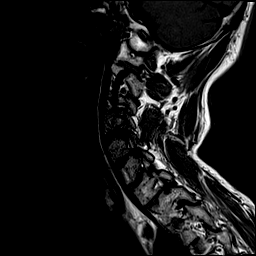
[im 15/15]
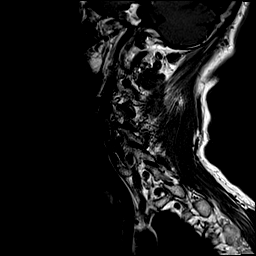

[Series 6: T2 · axial · 3.0mm · 0.62mm/px · z∈[-34,+65]mm · 9 of 26 slices shown (2 of 2)]
[im 1/26]
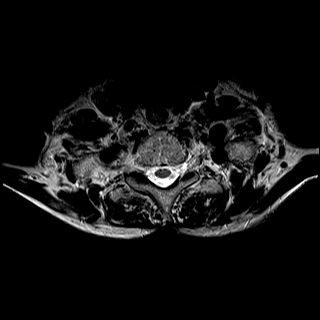
[im 5/26]
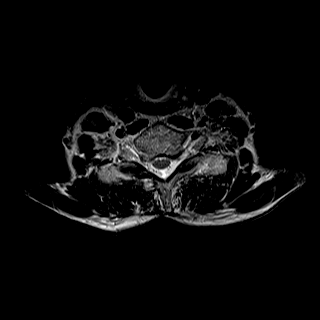
[im 9/26]
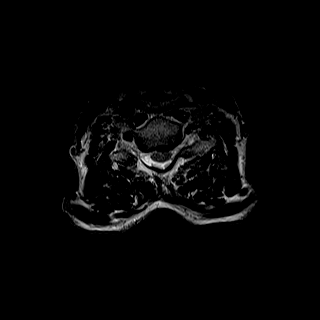
[im 11/26]
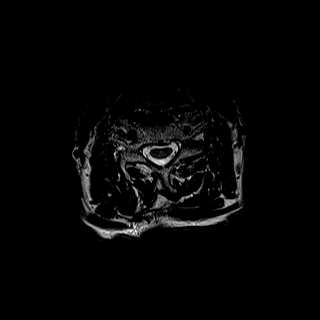
[im 13/26]
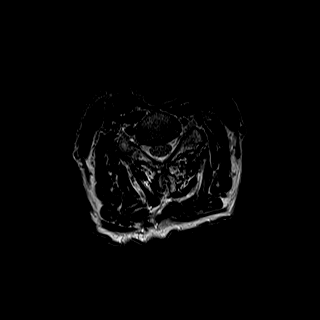
[im 15/26]
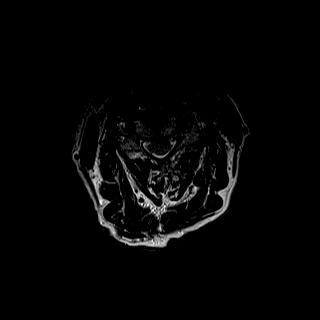
[im 17/26]
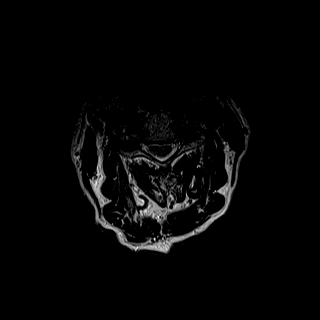
[im 21/26]
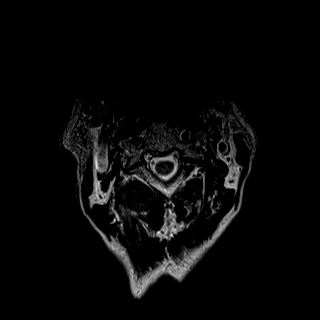
[im 26/26]
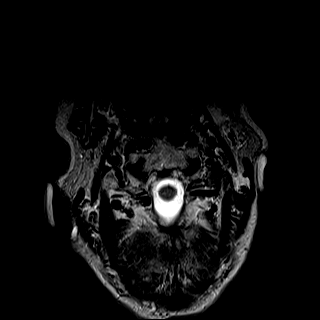

[Series 7: mpgr ax · axial · 3.0mm · 0.35mm/px · z∈[-24,+20]mm · 4 of 28 slices shown]
[im 1/28]
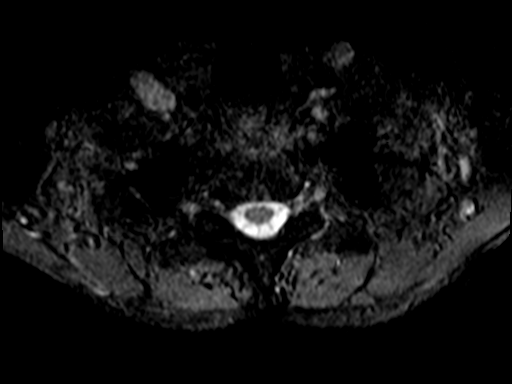
[im 5/28]
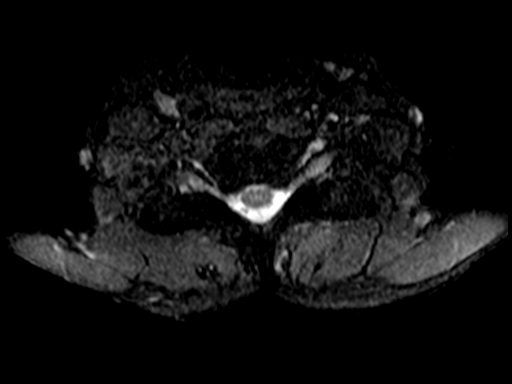
[im 9/28]
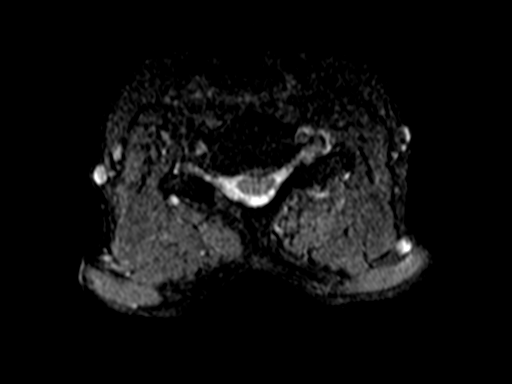
[im 13/28]
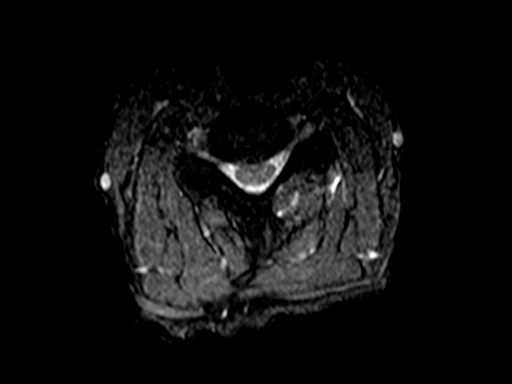

[34 of 48 positions shown; findings below may reference images not displayed]

FINDINGS: Alignment: Trace retrolisthesis C4 on C5 is unchanged. Alignment is
otherwise maintained.

Vertebrae: No fracture or worrisome lesion.

Cord: Normal signal throughout.

Posterior Fossa, vertebral arteries, paraspinal tissues: Negative.

Disc levels:

C2-3: Negative.

C3-4: Disc osteophyte complex and uncovertebral spurring are
identified. The ventral thecal sac is narrowed but not effaced.
Moderately severe bilateral foraminal narrowing is identified. The
appearance is not markedly changed.

C4-5: Disc osteophyte complex, uncovertebral disease and ligamentum
flavum thickening are seen. The ventral cord is flattened.
Moderately severe to severe foraminal narrowing is worse on the
left. The appearance is unchanged.

C5-6: There is a shallow disc bulge and some uncovertebral spurring,
worse on the left. The ventral thecal sac is effaced. Mild to
moderate bilateral foraminal narrowing is present. The appearance of
this level is unchanged.

C6-7: Shallow left paracentral protrusion without central canal or
foraminal stenosis.

C7-T1: Negative.
IMPRESSION: No change in the appearance of the cervical spine since the prior
MRI.

Spondylosis appears worst at C4-5 where there is flattening of the
ventral cord and moderately severe to severe bilateral foraminal
narrowing, worse on the left.

Moderately severe bilateral foraminal narrowing C3-4. Disc
osteophyte complex at this level narrows the ventral thecal sac.

Mild to moderate bilateral foraminal narrowing at C5-6 where the
ventral thecal sac is effaced by a shallow disc bulge.

## 2019-02-14 ENCOUNTER — Ambulatory Visit: Payer: Medicare Other | Admitting: Internal Medicine

## 2019-02-16 ENCOUNTER — Other Ambulatory Visit: Payer: Self-pay | Admitting: Sports Medicine

## 2019-02-16 MED ORDER — FUROSEMIDE 20 MG PO TABS: 20.0000 mg | ORAL_TABLET | Freq: Every day | ORAL | 0 refills | Status: DC | PRN

## 2019-02-25 ENCOUNTER — Other Ambulatory Visit: Payer: Self-pay

## 2019-02-25 ENCOUNTER — Ambulatory Visit (INDEPENDENT_AMBULATORY_CARE_PROVIDER_SITE_OTHER): Payer: Medicare Other

## 2019-02-25 ENCOUNTER — Ambulatory Visit (INDEPENDENT_AMBULATORY_CARE_PROVIDER_SITE_OTHER): Payer: Medicare Other | Admitting: Sports Medicine

## 2019-02-25 ENCOUNTER — Encounter: Payer: Self-pay | Admitting: Sports Medicine

## 2019-02-25 DIAGNOSIS — F329 Major depressive disorder, single episode, unspecified: Secondary | ICD-10-CM | POA: Diagnosis not present

## 2019-02-25 DIAGNOSIS — F419 Anxiety disorder, unspecified: Secondary | ICD-10-CM | POA: Diagnosis not present

## 2019-02-25 DIAGNOSIS — M5416 Radiculopathy, lumbar region: Secondary | ICD-10-CM

## 2019-02-25 DIAGNOSIS — H9202 Otalgia, left ear: Secondary | ICD-10-CM

## 2019-02-25 DIAGNOSIS — F32A Depression, unspecified: Secondary | ICD-10-CM

## 2019-02-25 DIAGNOSIS — M48061 Spinal stenosis, lumbar region without neurogenic claudication: Secondary | ICD-10-CM | POA: Insufficient documentation

## 2019-02-25 MED ORDER — BUSPIRONE HCL 10 MG PO TABS: 10.0000 mg | ORAL_TABLET | Freq: Three times a day (TID) | ORAL | 3 refills | Status: DC

## 2019-02-25 MED ORDER — PREDNISONE 50 MG PO TABS: ORAL_TABLET | ORAL | 0 refills | Status: DC

## 2019-02-25 NOTE — Assessment & Plan Note (Signed)
Pain in the left ear with a roaring/full sensation. Possible defect in the tympanic membrane on otoscopy. I would like him to discuss this with ENT.

## 2019-02-25 NOTE — Assessment & Plan Note (Signed)
Increase in anxiety, continue Effexor 225 daily. Increasing BuSpar to 10 mg 3 times daily. Continue trazodone at bedtime.

## 2019-02-25 NOTE — Progress Notes (Signed)
Subjective:    CC: Multiple issues  HPI: Right leg pain: Worse with sitting, flexion, Valsalva, radiating down the right leg, lateral knee, top of the foot.  No bowel or bladder dysfunction, saddle numbness, constitutional symptoms.  Anxiety: Historically has been stable on Effexor to 25, BuSpar 5 3 times daily, as well as trazodone 100 at night, having a bit of increasing anxiety with the current pandemic.  No suicidal or homicidal ideation.  Left ear pain: Hearing is for the most part intact.  No drainage, no fevers, chills, no trauma.  I reviewed the past medical history, family history, social history, surgical history, and allergies today and no changes were needed.  Please see the problem list section below in epic for further details.  Past Medical History: Past Medical History:  Diagnosis Date  . AAA (abdominal aortic aneurysm) ([place])   . AAA (abdominal aortic aneurysm) without rupture ([place])   . Acute respiratory failure ([place]) 03/13/2017  . Anxiety   . Atrial fibrillation (HCC)     Brief episode of  . Atrial fibrillation with rapid ventricular response ([place]) 01/06/2016  . Cardiomyopathy, ischemic 10/27/2014  . CHF (congestive heart failure) ([place])   . COPD (chronic obstructive pulmonary disease) ([place])   . Coronary artery disease    Multivessel, S/p stenting to LCx. LHC 03/16/17: unchanged MVD- 50%mid LAD, 85% 1st daig, 100% prox circ, 100% prox RCA  . Depression   . Dyslipidemia   . Headache   . Hyperlipidemia   . Hypertension   . Myocardial infarction ([place])   . Peripheral vascular disease ([place])   . Renal artery dissection ([place])   . Shortness of breath dyspnea   . Stroke ([place])   . Tobacco dependence   . Ventricular dysfunction    Mild Left   Past Surgical History: Past Surgical History:  Procedure Laterality Date  . ABDOMINAL AORTIC ANEURYSM REPAIR  01/03/2016   Procedure: ANEURYSM ABDOMINAL AORTIC REPAIR USING 14MM X 8MM X40CM HEMASHIELD GOLD GRAFT;  Surgeon: Conrad Grimes, MD;  Location: [facility] OR;  Service: Vascular;;  . ABDOMINAL AORTIC ENDOVASCULAR STENT GRAFT N/A 01/03/2016   Procedure: ABDOMINAL AORTIC ENDOVASCULAR STENT GRAFT IMPLANTED/EXPLANTED;  Surgeon: Conrad Hillsview, MD;  Location: [place];  Service: Vascular;  Laterality: N/A;  . AXILLARY-FEMORAL BYPASS GRAFT Bilateral 09/08/2017   Procedure: BYPASS GRAFT AXILLA-BIFEMORAL;  Surgeon: Rosetta Posner, MD;  Location: [place];  Service: Vascular;  Laterality: Bilateral;  . BACK SURGERY     cervical  . FALSE ANEURYSM REPAIR Bilateral 09/08/2017   Procedure: Bilateral Groin Exploration, Removal of Prosthetic graft. Vein Patch Closure of bilateral common femoral Arteries.;  Surgeon: Rosetta Posner, MD;  Location: [facility] OR;  Service: Vascular;  Laterality: Bilateral;  . HAND SURGERY Left    thumb surgery  . HERNIA REPAIR Right   . LEFT HEART CATH AND CORONARY ANGIOGRAPHY N/A 03/16/2017   Procedure: Left Heart Cath and Coronary Angiography;  Surgeon: Martinique, Peter M, MD;  Location: [place] CV LAB;  Service: Cardiovascular;  Laterality: N/A;  . LEFT HEART CATHETERIZATION WITH CORONARY ANGIOGRAM N/A 10/27/2014   Procedure: LEFT HEART CATHETERIZATION WITH CORONARY ANGIOGRAM;  Surgeon: Blane Ohara, MD;  Location: [facility] CATH LAB;  Service: Cardiovascular;  Laterality: N/A;  . NECK SURGERY    . stent     pt unsure of location  . US ECHOCARDIOGRAPHY  12-26-2008   EF 40-45%  . WRIST SURGERY Right    Social History: Social History   Socioeconomic History  .  Marital status: Widowed    Spouse name: Not on file  . Number of children: 1  . Years of education: Not on file  . Highest education level: Not on file  Occupational History  . Not on file  Social Needs  . Financial resource strain: Not on file  . Food insecurity    Worry: Not on file    Inability: Not on file  . Transportation needs    Medical: Not on file    Non-medical: Not on file  Tobacco Use  . Smoking status: Current Every Day Smoker    Packs/day:  0.50    Years: 20.00    Pack years: 10.00    Types: Cigarettes  . Smokeless tobacco: Never Used  . Tobacco comment: cutting back  Substance and Sexual Activity  . Alcohol use: No  . Drug use: No  . Sexual activity: Not Currently    Birth control/protection: None  Lifestyle  . Physical activity    Days per week: Not on file    Minutes per session: Not on file  . Stress: Not on file  Relationships  . Social Herbalist on phone: Not on file    Gets together: Not on file    Attends religious service: Not on file    Active member of club or organization: Not on file    Attends meetings of clubs or organizations: Not on file    Relationship status: Not on file  Other Topics Concern  . Not on file  Social History Narrative   ** Merged History Encounter **       Family History: Family History  Problem Relation Age of Onset  . AAA (abdominal aortic aneurysm) Mother   . Cancer Father        bone marrow   Allergies: No Known Allergies Medications: See med rec.  Review of Systems: No fevers, chills, night sweats, weight loss, chest pain, or shortness of breath.   Objective:    General: Well Developed, well nourished, and in no acute distress.  Neuro: Alert and oriented x3, extra-ocular muscles intact, sensation grossly intact.  HEENT: Normocephalic, atraumatic, pupils equal round reactive to light, neck supple, no masses, no lymphadenopathy, thyroid nonpalpable.  Oropharynx, nasopharynx unremarkable, possible defect in the left tympanic membrane at the 7 o'clock position.  No drainage, no erythema. Skin: Warm and dry, no rashes. Cardiac: Regular rate and rhythm, no murmurs rubs or gallops, no lower extremity edema.  Respiratory: Clear to auscultation bilaterally. Not using accessory muscles, speaking in full sentences.  Impression and Recommendations:    Right lumbar radiculitis Prednisone for 5 days, x-rays, rehab exercises. Right L4 distribution. Return to see  me in 1 month, MRI for interventional planning if no better.  Pain, ear, left Pain in the left ear with a roaring/full sensation. Possible defect in the tympanic membrane on otoscopy. I would like him to discuss this with ENT.  Anxiety and depression Increase in anxiety, continue Effexor 225 daily. Increasing BuSpar to 10 mg 3 times daily. Continue trazodone at bedtime.   ___________________________________________ Gwen Her. Dianah Field, M.D., ABFM., CAQSM. Primary Care and Sports Medicine [facility]  Adjunct Professor of [place] [employer]

## 2019-02-25 NOTE — Assessment & Plan Note (Signed)
Prednisone for 5 days, x-rays, rehab exercises. Right L4 distribution. Return to see me in 1 month, MRI for interventional planning if no better.

## 2019-02-27 ENCOUNTER — Other Ambulatory Visit: Payer: Self-pay | Admitting: Internal Medicine

## 2019-02-27 DIAGNOSIS — T827XXD Infection and inflammatory reaction due to other cardiac and vascular devices, implants and grafts, subsequent encounter: Secondary | ICD-10-CM

## 2019-02-28 NOTE — Telephone Encounter (Signed)
Routing to MD if appropriate to refill.  Eugenia Mcalpine, LPN

## 2019-03-12 ENCOUNTER — Other Ambulatory Visit: Payer: Self-pay | Admitting: Internal Medicine

## 2019-03-12 DIAGNOSIS — T827XXD Infection and inflammatory reaction due to other cardiac and vascular devices, implants and grafts, subsequent encounter: Secondary | ICD-10-CM

## 2019-03-14 ENCOUNTER — Other Ambulatory Visit: Payer: Self-pay | Admitting: *Deleted

## 2019-03-22 ENCOUNTER — Telehealth: Payer: Self-pay | Admitting: Cardiovascular Disease

## 2019-03-22 ENCOUNTER — Ambulatory Visit: Payer: Medicare Other | Admitting: Cardiovascular Disease

## 2019-03-22 NOTE — Telephone Encounter (Signed)
New Message ° ° °Unable to leave message due to mailbox being full °Called to confirm appt and answer covid questions  °

## 2019-03-23 ENCOUNTER — Encounter: Payer: Self-pay | Admitting: Sports Medicine

## 2019-03-23 ENCOUNTER — Ambulatory Visit (INDEPENDENT_AMBULATORY_CARE_PROVIDER_SITE_OTHER): Payer: Medicare Other | Admitting: Sports Medicine

## 2019-03-23 ENCOUNTER — Other Ambulatory Visit: Payer: Self-pay

## 2019-03-23 DIAGNOSIS — F329 Major depressive disorder, single episode, unspecified: Secondary | ICD-10-CM | POA: Diagnosis not present

## 2019-03-23 DIAGNOSIS — M5416 Radiculopathy, lumbar region: Secondary | ICD-10-CM

## 2019-03-23 DIAGNOSIS — F32A Depression, unspecified: Secondary | ICD-10-CM

## 2019-03-23 DIAGNOSIS — F419 Anxiety disorder, unspecified: Secondary | ICD-10-CM | POA: Diagnosis not present

## 2019-03-23 NOTE — Assessment & Plan Note (Signed)
Prednisone, rehab exercises have helped drastically. His previous right L4 distribution radiculitis continues to improve, he is happy with how things are going and thus we do not need to proceed with MRI for interventional planning.

## 2019-03-23 NOTE — Assessment & Plan Note (Signed)
[name] is done really well with Effexor 225 mg daily and BuSpar 10 mg 3 times daily, as well as trazodone at bedtime. He does have some anxiety but is mostly centers around boredom. Rather than throw any more medications at this I think that we should go ahead and set him up with behavioral therapy.

## 2019-03-23 NOTE — Progress Notes (Signed)
Subjective:    CC: Follow-up  HPI: Anxiety: Doing well, moderate boredom, he is agreeable to try behavioral therapy rather than throw more medications at this.  Lumbar radiculitis: Continues to improve, he is happy with how things are going and does not desire to proceed to the next steps.  I reviewed the past medical history, family history, social history, surgical history, and allergies today and no changes were needed.  Please see the problem list section below in epic for further details.  Past Medical History: Past Medical History:  Diagnosis Date  . AAA (abdominal aortic aneurysm) ([place])   . AAA (abdominal aortic aneurysm) without rupture ([place])   . Acute respiratory failure ([place]) 03/13/2017  . Anxiety   . Atrial fibrillation (HCC)     Brief episode of  . Atrial fibrillation with rapid ventricular response ([place]) 01/06/2016  . Cardiomyopathy, ischemic 10/27/2014  . CHF (congestive heart failure) ([place])   . COPD (chronic obstructive pulmonary disease) ([place])   . Coronary artery disease    Multivessel, S/p stenting to LCx. LHC 03/16/17: unchanged MVD- 50%mid LAD, 85% 1st daig, 100% prox circ, 100% prox RCA  . Depression   . Dyslipidemia   . Headache   . Hyperlipidemia   . Hypertension   . Myocardial infarction ([place])   . Peripheral vascular disease ([place])   . Renal artery dissection ([place])   . Shortness of breath dyspnea   . Stroke ([place])   . Tobacco dependence   . Ventricular dysfunction    Mild Left   Past Surgical History: Past Surgical History:  Procedure Laterality Date  . ABDOMINAL AORTIC ANEURYSM REPAIR  01/03/2016   Procedure: ANEURYSM ABDOMINAL AORTIC REPAIR USING 14MM X 8MM X40CM HEMASHIELD GOLD GRAFT;  Surgeon: Conrad Frierson, MD;  [facility];  Service: Vascular;;  . ABDOMINAL AORTIC ENDOVASCULAR STENT GRAFT N/A 01/03/2016   Procedure: ABDOMINAL AORTIC ENDOVASCULAR STENT GRAFT IMPLANTED/EXPLANTED;  Surgeon: Conrad Lenoir, MD;  Location: [place];  Service: Vascular;   Laterality: N/A;  . AXILLARY-FEMORAL BYPASS GRAFT Bilateral 09/08/2017   Procedure: BYPASS GRAFT AXILLA-BIFEMORAL;  Surgeon: Rosetta Posner, MD;  Location: [place];  Service: Vascular;  Laterality: Bilateral;  . BACK SURGERY     cervical  . FALSE ANEURYSM REPAIR Bilateral 09/08/2017   Procedure: Bilateral Groin Exploration, Removal of Prosthetic graft. Vein Patch Closure of bilateral common femoral Arteries.;  Surgeon: Rosetta Posner, MD;  Location: [facility] OR;  Service: Vascular;  Laterality: Bilateral;  . HAND SURGERY Left    thumb surgery  . HERNIA REPAIR Right   . LEFT HEART CATH AND CORONARY ANGIOGRAPHY N/A 03/16/2017   Procedure: Left Heart Cath and Coronary Angiography;  Surgeon: Martinique, Peter M, MD;  Location: [facility] LAB;  Service: Cardiovascular;  Laterality: N/A;  . LEFT HEART CATHETERIZATION WITH CORONARY ANGIOGRAM N/A 10/27/2014   Procedure: LEFT HEART CATHETERIZATION WITH CORONARY ANGIOGRAM;  Surgeon: Blane Ohara, MD;  Location: [facility] CATH LAB;  Service: Cardiovascular;  Laterality: N/A;  . NECK SURGERY    . stent     pt unsure of location  . US ECHOCARDIOGRAPHY  12-26-2008   EF 40-45%  . WRIST SURGERY Right    Social History: Social History   Socioeconomic History  . Marital status: Widowed    Spouse name: Not on file  . Number of children: 1  . Years of education: Not on file  . Highest education level: Not on file  Occupational History  . Not on file  Social Needs  . Financial resource strain: Not on file  . Food insecurity    Worry: Not on file    Inability: Not on file  . Transportation needs    Medical: Not on file    Non-medical: Not on file  Tobacco Use  . Smoking status: Current Every Day Smoker    Packs/day: 0.50    Years: 20.00    Pack years: 10.00    Types: Cigarettes  . Smokeless tobacco: Never Used  . Tobacco comment: cutting back  Substance and Sexual Activity  . Alcohol use: No  . Drug use: No  . Sexual activity: Not Currently    Birth  control/protection: None  Lifestyle  . Physical activity    Days per week: Not on file    Minutes per session: Not on file  . Stress: Not on file  Relationships  . Social Herbalist on phone: Not on file    Gets together: Not on file    Attends religious service: Not on file    Active member of club or organization: Not on file    Attends meetings of clubs or organizations: Not on file    Relationship status: Not on file  Other Topics Concern  . Not on file  Social History Narrative   ** Merged History Encounter **       Family History: Family History  Problem Relation Age of Onset  . AAA (abdominal aortic aneurysm) Mother   . Cancer Father        bone marrow   Allergies: No Known Allergies Medications: See med rec.  Review of Systems: No fevers, chills, night sweats, weight loss, chest pain, or shortness of breath.   Objective:    General: Well Developed, well nourished, and in no acute distress.  Neuro: Alert and oriented x3, extra-ocular muscles intact, sensation grossly intact.  HEENT: Normocephalic, atraumatic, pupils equal round reactive to light, neck supple, no masses, no lymphadenopathy, thyroid nonpalpable.  Skin: Warm and dry, no rashes. Cardiac: Regular rate and rhythm, no murmurs rubs or gallops, no lower extremity edema.  Respiratory: Clear to auscultation bilaterally. Not using accessory muscles, speaking in full sentences.  Impression and Recommendations:    Anxiety and depression Loraine is done really well with Effexor 225 mg daily and BuSpar 10 mg 3 times daily, as well as trazodone at bedtime. He does have some anxiety but is mostly centers around boredom. Rather than throw any more medications at this I think that we should go ahead and set him up with behavioral therapy.   Right lumbar radiculitis Prednisone, rehab exercises have helped drastically. His previous right L4 distribution radiculitis continues to improve, he is happy with  how things are going and thus we do not need to proceed with MRI for interventional planning.   ___________________________________________ Gwen Her. Dianah Field, M.D., ABFM., CAQSM. Primary Care and Sports Medicine [facility]  Adjunct Professor of [place] of [employer] [facility] [employer]

## 2019-03-30 ENCOUNTER — Other Ambulatory Visit: Payer: Self-pay | Admitting: Internal Medicine

## 2019-03-30 DIAGNOSIS — I4819 Other persistent atrial fibrillation: Secondary | ICD-10-CM

## 2019-03-30 NOTE — Telephone Encounter (Signed)
Outpatient Medication Detail   Disp Refills Start End   amiodarone (PACERONE) 200 MG tablet 30 tablet 0 03/30/2019    Sig: TAKE 1 TABLET BY MOUTH EVERY DAY. Please make appt for future refills. [phone]. 1st attempt.   Sent to pharmacy as: amiodarone (PACERONE) 200 MG tablet   E-Prescribing Status: Receipt confirmed by pharmacy (03/30/2019 12:03 PM EDT)   Associated Diagnoses  Persistent atrial fibrillation     [place], [place]

## 2019-03-31 ENCOUNTER — Other Ambulatory Visit: Payer: Self-pay

## 2019-03-31 ENCOUNTER — Ambulatory Visit (INDEPENDENT_AMBULATORY_CARE_PROVIDER_SITE_OTHER): Payer: Medicare Other | Admitting: Internal Medicine

## 2019-03-31 ENCOUNTER — Telehealth: Payer: Self-pay | Admitting: Internal Medicine

## 2019-03-31 NOTE — Telephone Encounter (Signed)
I attempted to conduct an E visit with Mr. [name] today.  Even though my nurse had spoken to him by phone only a few minutes earlier he did not answer my calls on 4 separate occasions.  His voicemail has not been set up so I could not leave.

## 2019-04-01 NOTE — Progress Notes (Signed)
I attempted to conduct an E visit with [name] but there was no answer on his number.  I called 4 times and could not leave a message.

## 2019-04-04 ENCOUNTER — Telehealth: Payer: Self-pay

## 2019-04-04 ENCOUNTER — Other Ambulatory Visit: Payer: Self-pay

## 2019-04-04 ENCOUNTER — Ambulatory Visit (INDEPENDENT_AMBULATORY_CARE_PROVIDER_SITE_OTHER): Payer: Medicare Other | Admitting: Internal Medicine

## 2019-04-04 DIAGNOSIS — T827XXD Infection and inflammatory reaction due to other cardiac and vascular devices, implants and grafts, subsequent encounter: Secondary | ICD-10-CM | POA: Diagnosis not present

## 2019-04-04 NOTE — Telephone Encounter (Signed)
Patient's daughter is callling with concerns of no return call from Dr Megan Salon.  She was informed Dr Megan Salon attempted to call 4 times and there was no answer.   Daughter requesting a return call to discuss IV antibiotics continuation or discontinue.  Can we reschedule e visit ?  I will schedule evisit for today @ 4 pm.   Laverle Patter, RN

## 2019-04-04 NOTE — Progress Notes (Signed)
Virtual Visit via Telephone Note  I connected with [name] on 04/04/19 at  4:00 PM EDT by telephone and verified that I am speaking with the correct person using two identifiers.  Location: Patient: Home Provider: [facility]   I discussed the limitations, risks, security and privacy concerns of performing an evaluation and management service by telephone and the availability of in person appointments. I also discussed with the patient that there may be a patient responsible charge related to this service. The patient expressed understanding and agreed to proceed.   History of Present Illness: I called and spoke with [name] today.  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  He has not had any problems tolerating his antibiotics.  He has not had any fever, nausea, vomiting or diarrhea.   Observations/Objective:   Assessment and Plan: He told me again that he would like to remain on chronic antibiotics as long as he is tolerating them.  Follow Up Instructions: Continue doxycycline and cefuroxime Follow-up here in 6 months   I discussed the assessment and treatment plan with the patient. The patient was provided an opportunity to ask questions and all were answered. The patient agreed with the plan and demonstrated an understanding of the instructions.   The patient was advised to call back or seek an in-person evaluation if the symptoms worsen or if the condition fails to improve as anticipated.  I provided 8 minutes of non-face-to-face time during this encounter.   Michel Bickers, MD

## 2019-04-24 ENCOUNTER — Other Ambulatory Visit: Payer: Self-pay | Admitting: Sports Medicine

## 2019-04-24 DIAGNOSIS — J471 Bronchiectasis with (acute) exacerbation: Secondary | ICD-10-CM

## 2019-04-24 DIAGNOSIS — J449 Chronic obstructive pulmonary disease, unspecified: Secondary | ICD-10-CM

## 2019-04-30 ENCOUNTER — Other Ambulatory Visit: Payer: Self-pay | Admitting: Internal Medicine

## 2019-04-30 DIAGNOSIS — I4819 Other persistent atrial fibrillation: Secondary | ICD-10-CM

## 2019-05-02 ENCOUNTER — Other Ambulatory Visit: Payer: Self-pay

## 2019-05-02 ENCOUNTER — Other Ambulatory Visit: Payer: Self-pay | Admitting: Internal Medicine

## 2019-05-02 DIAGNOSIS — I5022 Chronic systolic (congestive) heart failure: Secondary | ICD-10-CM

## 2019-05-02 DIAGNOSIS — I4819 Other persistent atrial fibrillation: Secondary | ICD-10-CM

## 2019-05-02 MED ORDER — ENTRESTO 24-26 MG PO TABS: 1.0000 | ORAL_TABLET | Freq: Two times a day (BID) | ORAL | 3 refills | Status: DC

## 2019-05-02 MED ORDER — AMIODARONE HCL 200 MG PO TABS: ORAL_TABLET | ORAL | 0 refills | Status: DC

## 2019-05-02 NOTE — Telephone Encounter (Signed)
New Message    *STAT* If patient is at the pharmacy, call can be transferred to refill team.   1. Which medications need to be refilled? (please list name of each medication and dose if known) amiodarone (PACERONE) 200 MG tablet  2. Which pharmacy/location (including street and city if local pharmacy) is medication to be sent to? [place], [place] AT [place]  3. Do they need a 30 day or 90 day supply? [place]

## 2019-05-02 NOTE — Telephone Encounter (Signed)
Pt's medication was sent to pt's pharmacy as requested. Confirmation received.  °

## 2019-05-10 ENCOUNTER — Ambulatory Visit (INDEPENDENT_AMBULATORY_CARE_PROVIDER_SITE_OTHER): Payer: Medicare Other

## 2019-05-10 ENCOUNTER — Telehealth: Payer: Self-pay

## 2019-05-10 ENCOUNTER — Ambulatory Visit (INDEPENDENT_AMBULATORY_CARE_PROVIDER_SITE_OTHER): Payer: Medicare Other | Admitting: Sports Medicine

## 2019-05-10 ENCOUNTER — Other Ambulatory Visit: Payer: Self-pay

## 2019-05-10 DIAGNOSIS — M4807 Spinal stenosis, lumbosacral region: Secondary | ICD-10-CM

## 2019-05-10 DIAGNOSIS — M48062 Spinal stenosis, lumbar region with neurogenic claudication: Secondary | ICD-10-CM

## 2019-05-10 DIAGNOSIS — J479 Bronchiectasis, uncomplicated: Secondary | ICD-10-CM | POA: Diagnosis not present

## 2019-05-10 DIAGNOSIS — R918 Other nonspecific abnormal finding of lung field: Secondary | ICD-10-CM | POA: Diagnosis not present

## 2019-05-10 DIAGNOSIS — J439 Emphysema, unspecified: Secondary | ICD-10-CM | POA: Diagnosis not present

## 2019-05-10 DIAGNOSIS — R911 Solitary pulmonary nodule: Secondary | ICD-10-CM | POA: Diagnosis not present

## 2019-05-10 DIAGNOSIS — F32A Depression, unspecified: Secondary | ICD-10-CM

## 2019-05-10 DIAGNOSIS — J471 Bronchiectasis with (acute) exacerbation: Secondary | ICD-10-CM | POA: Diagnosis not present

## 2019-05-10 DIAGNOSIS — M549 Dorsalgia, unspecified: Secondary | ICD-10-CM | POA: Diagnosis not present

## 2019-05-10 DIAGNOSIS — F329 Major depressive disorder, single episode, unspecified: Secondary | ICD-10-CM

## 2019-05-10 MED ORDER — VENLAFAXINE HCL ER 75 MG PO CP24: 225.0000 mg | ORAL_CAPSULE | Freq: Every day | ORAL | 1 refills | Status: DC

## 2019-05-10 MED ORDER — AZITHROMYCIN 250 MG PO TABS: ORAL_TABLET | ORAL | 0 refills | Status: DC

## 2019-05-10 MED ORDER — PREDNISONE 50 MG PO TABS: ORAL_TABLET | ORAL | 0 refills | Status: DC

## 2019-05-10 MED ORDER — FUROSEMIDE 20 MG PO TABS: 20.0000 mg | ORAL_TABLET | Freq: Every day | ORAL | 1 refills | Status: AC | PRN

## 2019-05-10 NOTE — Assessment & Plan Note (Addendum)
Bilateral lumbar radiculitis, weakness. Adding lumbar spine x-rays, MRI for interventional planning.  Spinal stenosis worst at L2-L3, adding bilateral L2-L3 transforaminal epidurals

## 2019-05-10 NOTE — Progress Notes (Addendum)
Subjective:    CC: Follow-up  HPI: Leg numbness and tingling: History of lumbar spinal stenosis, DJD, now with worsening numbness and tingling running down both legs, moderate, persistent.  No bowel or bladder dysfunction saddle numbness, constitutional symptoms.  Coughing: History of bronchiectasis, previously managed with pulmonology, now having an increase in cough, mild hemoptysis.  I reviewed the past medical history, family history, social history, surgical history, and allergies today and no changes were needed.  Please see the problem list section below in epic for further details.  Past Medical History: Past Medical History:  Diagnosis Date  . AAA (abdominal aortic aneurysm) ([place])   . AAA (abdominal aortic aneurysm) without rupture ([place])   . Acute respiratory failure ([place]) 03/13/2017  . Anxiety   . Atrial fibrillation (HCC)     Brief episode of  . Atrial fibrillation with rapid ventricular response ([place]) 01/06/2016  . Cardiomyopathy, ischemic 10/27/2014  . CHF (congestive heart failure) ([place])   . COPD (chronic obstructive pulmonary disease) ([place])   . Coronary artery disease    Multivessel, S/p stenting to LCx. LHC 03/16/17: unchanged MVD- 50%mid LAD, 85% 1st daig, 100% prox circ, 100% prox RCA  . Depression   . Dyslipidemia   . Headache   . Hyperlipidemia   . Hypertension   . Myocardial infarction ([place])   . Peripheral vascular disease ([place])   . Renal artery dissection ([place])   . Shortness of breath dyspnea   . Stroke ([place])   . Tobacco dependence   . Ventricular dysfunction    Mild Left   Past Surgical History: Past Surgical History:  Procedure Laterality Date  . ABDOMINAL AORTIC ANEURYSM REPAIR  01/03/2016   Procedure: ANEURYSM ABDOMINAL AORTIC REPAIR USING 14MM X 8MM X40CM HEMASHIELD GOLD GRAFT;  Surgeon: Conrad [place], MD;  Location: [facility] OR;  Service: Vascular;;  . ABDOMINAL AORTIC ENDOVASCULAR STENT GRAFT N/A 01/03/2016   Procedure: ABDOMINAL AORTIC ENDOVASCULAR  STENT GRAFT IMPLANTED/EXPLANTED;  Surgeon: Conrad Oberlin, MD;  Location: [facility];  Service: Vascular;  Laterality: N/A;  . AXILLARY-FEMORAL BYPASS GRAFT Bilateral 09/08/2017   Procedure: BYPASS GRAFT AXILLA-BIFEMORAL;  Surgeon: Rosetta Posner, MD;  Location: [facility];  Service: Vascular;  Laterality: Bilateral;  . BACK SURGERY     cervical  . FALSE ANEURYSM REPAIR Bilateral 09/08/2017   Procedure: Bilateral Groin Exploration, Removal of Prosthetic graft. Vein Patch Closure of bilateral common femoral Arteries.;  Surgeon: Rosetta Posner, MD;  Location: [facility] OR;  Service: Vascular;  Laterality: Bilateral;  . HAND SURGERY Left    thumb surgery  . HERNIA REPAIR Right   . LEFT HEART CATH AND CORONARY ANGIOGRAPHY N/A 03/16/2017   Procedure: Left Heart Cath and Coronary Angiography;  Surgeon: Martinique, Peter M, MD;  Location: [place] CV LAB;  Service: Cardiovascular;  Laterality: N/A;  . LEFT HEART CATHETERIZATION WITH CORONARY ANGIOGRAM N/A 10/27/2014   Procedure: LEFT HEART CATHETERIZATION WITH CORONARY ANGIOGRAM;  Surgeon: Blane Ohara, MD;  Location: [facility] CATH LAB;  Service: Cardiovascular;  Laterality: N/A;  . NECK SURGERY    . stent     pt unsure of location  . US ECHOCARDIOGRAPHY  12-26-2008   EF 40-45%  . WRIST SURGERY Right    Social History: Social History   Socioeconomic History  . Marital status: Widowed    Spouse name: Not on file  . Number of children: 1  . Years of education: Not on file  . Highest education level: Not on file  Occupational History  .  Not on file  Social Needs  . Financial resource strain: Not on file  . Food insecurity    Worry: Not on file    Inability: Not on file  . Transportation needs    Medical: Not on file    Non-medical: Not on file  Tobacco Use  . Smoking status: Current Every Day Smoker    Packs/day: 0.50    Years: 20.00    Pack years: 10.00    Types: Cigarettes  . Smokeless tobacco: Never Used  . Tobacco comment: cutting back  Substance and  Sexual Activity  . Alcohol use: No  . Drug use: No  . Sexual activity: Not Currently    Birth control/protection: None  Lifestyle  . Physical activity    Days per week: Not on file    Minutes per session: Not on file  . Stress: Not on file  Relationships  . Social Herbalist on phone: Not on file    Gets together: Not on file    Attends religious service: Not on file    Active member of club or organization: Not on file    Attends meetings of clubs or organizations: Not on file    Relationship status: Not on file  Other Topics Concern  . Not on file  Social History Narrative   ** Merged History Encounter **       Family History: Family History  Problem Relation Age of Onset  . AAA (abdominal aortic aneurysm) Mother   . Cancer Father        bone marrow   Allergies: No Known Allergies Medications: See med rec.  Review of Systems: No fevers, chills, night sweats, weight loss, chest pain, or shortness of breath.   Objective:    General: Well Developed, well nourished, and in no acute distress.  Neuro: Alert and oriented x3, extra-ocular muscles intact, sensation grossly intact.  HEENT: Normocephalic, atraumatic, pupils equal round reactive to light, neck supple, no masses, no lymphadenopathy, thyroid nonpalpable.  Skin: Warm and dry, no rashes. Cardiac: Regular rate and rhythm, no murmurs rubs or gallops, no lower extremity edema.  Respiratory: Slightly coarse sounds right upper lobe. Not using accessory muscles, speaking in full sentences.  Impression and Recommendations:    Lumbar spinal stenosis Bilateral lumbar radiculitis, weakness. Adding lumbar spine x-rays, MRI for interventional planning.  Spinal stenosis worst at L2-L3, adding bilateral L2-L3 transforaminal epidurals  Bronchiectasis with acute exacerbation ([place]) Needs to continue to use chest PT device. Adding prednisone, azithromycin. Chest x-ray. He would like a referral to a new  pulmonologist due to change in location.   [facility]  Mass of left lung There is a lesion in the left lower lobe, this may very well be a pneumonia, I would like to proceed with a chest CT to ensure that this is not a mass. He will need renal function.   ___________________________________________ Gwen Her. Dianah Field, M.D., ABFM., CAQSM. Primary Care and Sports Medicine [facility]  Adjunct Professor of [place] of [facility] of Medicine

## 2019-05-10 NOTE — Assessment & Plan Note (Signed)
Needs to continue to use chest PT device. Adding prednisone, azithromycin. Chest x-ray. He would like a referral to a new pulmonologist due to change in location.   [facility] convenient in location.

## 2019-05-10 NOTE — Telephone Encounter (Signed)
Pt dtr called requesting RF be sent to [facility] for Lasix and Venlafaxine

## 2019-05-11 DIAGNOSIS — R918 Other nonspecific abnormal finding of lung field: Secondary | ICD-10-CM | POA: Insufficient documentation

## 2019-05-11 NOTE — Assessment & Plan Note (Signed)
There is a lesion in the left lower lobe, this may very well be a pneumonia, I would like to proceed with a chest CT to ensure that this is not a mass. He will need renal function.

## 2019-05-11 NOTE — Addendum Note (Signed)
Addended by: Silverio Decamp on: 05/11/2019 09:05 AM   Modules accepted: Orders

## 2019-05-17 ENCOUNTER — Other Ambulatory Visit: Payer: Self-pay

## 2019-05-17 ENCOUNTER — Ambulatory Visit (INDEPENDENT_AMBULATORY_CARE_PROVIDER_SITE_OTHER): Payer: Medicare Other

## 2019-05-17 DIAGNOSIS — R200 Anesthesia of skin: Secondary | ICD-10-CM | POA: Diagnosis not present

## 2019-05-17 DIAGNOSIS — R911 Solitary pulmonary nodule: Secondary | ICD-10-CM

## 2019-05-17 DIAGNOSIS — M7918 Myalgia, other site: Secondary | ICD-10-CM | POA: Diagnosis not present

## 2019-05-17 DIAGNOSIS — R918 Other nonspecific abnormal finding of lung field: Secondary | ICD-10-CM | POA: Diagnosis not present

## 2019-05-17 DIAGNOSIS — I2510 Atherosclerotic heart disease of native coronary artery without angina pectoris: Secondary | ICD-10-CM | POA: Diagnosis not present

## 2019-05-17 DIAGNOSIS — M47816 Spondylosis without myelopathy or radiculopathy, lumbar region: Secondary | ICD-10-CM | POA: Diagnosis not present

## 2019-05-17 DIAGNOSIS — J984 Other disorders of lung: Secondary | ICD-10-CM | POA: Diagnosis not present

## 2019-05-17 DIAGNOSIS — M48062 Spinal stenosis, lumbar region with neurogenic claudication: Secondary | ICD-10-CM | POA: Diagnosis not present

## 2019-05-17 DIAGNOSIS — K469 Unspecified abdominal hernia without obstruction or gangrene: Secondary | ICD-10-CM | POA: Diagnosis not present

## 2019-05-17 DIAGNOSIS — I700 Atherosclerosis of aorta: Secondary | ICD-10-CM | POA: Diagnosis not present

## 2019-05-17 DIAGNOSIS — J479 Bronchiectasis, uncomplicated: Secondary | ICD-10-CM | POA: Diagnosis not present

## 2019-05-17 DIAGNOSIS — J9811 Atelectasis: Secondary | ICD-10-CM | POA: Diagnosis not present

## 2019-05-17 DIAGNOSIS — M4807 Spinal stenosis, lumbosacral region: Secondary | ICD-10-CM | POA: Diagnosis not present

## 2019-05-17 DIAGNOSIS — M5127 Other intervertebral disc displacement, lumbosacral region: Secondary | ICD-10-CM | POA: Diagnosis not present

## 2019-05-17 DIAGNOSIS — J90 Pleural effusion, not elsewhere classified: Secondary | ICD-10-CM | POA: Diagnosis not present

## 2019-05-17 DIAGNOSIS — R531 Weakness: Secondary | ICD-10-CM | POA: Diagnosis not present

## 2019-05-17 DIAGNOSIS — I714 Abdominal aortic aneurysm, without rupture: Secondary | ICD-10-CM | POA: Diagnosis not present

## 2019-05-17 DIAGNOSIS — M5136 Other intervertebral disc degeneration, lumbar region: Secondary | ICD-10-CM | POA: Diagnosis not present

## 2019-05-17 DIAGNOSIS — M48061 Spinal stenosis, lumbar region without neurogenic claudication: Secondary | ICD-10-CM | POA: Diagnosis not present

## 2019-05-17 DIAGNOSIS — M47817 Spondylosis without myelopathy or radiculopathy, lumbosacral region: Secondary | ICD-10-CM | POA: Diagnosis not present

## 2019-05-17 DIAGNOSIS — M5126 Other intervertebral disc displacement, lumbar region: Secondary | ICD-10-CM | POA: Diagnosis not present

## 2019-05-17 DIAGNOSIS — J432 Centrilobular emphysema: Secondary | ICD-10-CM | POA: Diagnosis not present

## 2019-05-18 NOTE — Addendum Note (Signed)
Addended by: Silverio Decamp on: 05/18/2019 05:27 PM   Modules accepted: Orders

## 2019-05-24 DIAGNOSIS — M5136 Other intervertebral disc degeneration, lumbar region: Secondary | ICD-10-CM | POA: Diagnosis not present

## 2019-05-24 DIAGNOSIS — M4726 Other spondylosis with radiculopathy, lumbar region: Secondary | ICD-10-CM | POA: Diagnosis not present

## 2019-05-24 DIAGNOSIS — M48061 Spinal stenosis, lumbar region without neurogenic claudication: Secondary | ICD-10-CM | POA: Diagnosis not present

## 2019-06-13 ENCOUNTER — Telehealth: Payer: Self-pay | Admitting: Internal Medicine

## 2019-06-13 NOTE — Telephone Encounter (Signed)
Received call from pt granddaughter stating pt would like to switch pulmonologists - from Dr. Ander Slade to Dr. Melvyn Novas.  AO, please advise if you are ok with pt switching to MW. Thank you.

## 2019-06-14 NOTE — Telephone Encounter (Signed)
Fine with me will need 30 min consult slot

## 2019-06-14 NOTE — Telephone Encounter (Signed)
Spoke with pt's daughter, Adonis Huguenin. Pt has been scheduled with Dr. Ander Slade on 06/20/2019 at 1615. Nothing further was needed.

## 2019-06-14 NOTE — Telephone Encounter (Signed)
MW, please advise if you are okay with setting this pt up for a consultation appt with you. Thank you.

## 2019-06-14 NOTE — Telephone Encounter (Signed)
Okay with me 

## 2019-06-20 ENCOUNTER — Encounter: Payer: Self-pay | Admitting: Pulmonary Disease

## 2019-06-20 ENCOUNTER — Other Ambulatory Visit: Payer: Self-pay

## 2019-06-20 ENCOUNTER — Ambulatory Visit (INDEPENDENT_AMBULATORY_CARE_PROVIDER_SITE_OTHER): Payer: Medicare Other | Admitting: Pulmonary Disease

## 2019-06-20 DIAGNOSIS — J9811 Atelectasis: Secondary | ICD-10-CM

## 2019-06-20 NOTE — Patient Instructions (Signed)
COPD -Continue your inhalers  Abnormal CT scan of the chest -We will repeat CT to look for any evolution of findings  Hemoptysis -Need to figure out what is bleeding  Bronchoscopy-to figure out what is causing the obstruction in the lung  We will schedule you for the bronchoscopy as soon as you get back  Call with any significant concerns   Hemoptysis  Hemoptysis is coughing up blood. It can be mild or serious. With mild hemoptysis, you may cough up blood-streaked saliva and mucus (sputum) when you have an infection in your nose, throat, or lungs (respiratory tract). Coughing up 1-2 cups (240-480 mL) of blood within 24 hours (massive hemoptysis) is a medical emergency. The most common cause of hemoptysis is a respiratory tract infection, such as bronchitis or pneumonia. Other common causes include:  A lung tumor or upper airway tumor.  A medical condition that damages the large air passageways (bronchiectasis).  A blood clot in the lungs (pulmonary embolism).  A medical condition that keeps your blood from clotting normally.  Breathing in (inhaling) a small foreign object. Sometimes the cause is not known. Hemoptysis can be a sign of a minor or serious medical condition, so it is important to see your health care provider. Follow these instructions at home:  Watch your condition for any changes.  Take over-the-counter and prescription medicines only as told by your health care provider.  If you were prescribed an antibiotic medicine, take it as told by your health care provider. Do not stop taking the antibiotic even if you start to feel better.  Return to your normal activities as told by your health care provider. Ask your health care provider what activities are safe for you.  Do not use any products that contain nicotine or tobacco, such as cigarettes and e-cigarettes. If you need help quitting, ask your health care provider.  Keep all follow-up visits as told by your  health care provider. This is important. Contact a health care provider if:  You have a fever.  You cough up blood-streaked (blood-tinged) sputum. Get help right away if:  You cough up fresh blood or blood clots.  You have trouble breathing.  You have chest pain.    Flexible Bronchoscopy  Flexible bronchoscopy is a procedure that is used to examine the passageways in the lungs. During the procedure, a thin, flexible tool with a camera on it (bronchoscope) is passed into the mouth or nose, down through the windpipe (trachea), and into the air tubes (bronchi) in the lungs. This tool allows your health care provider to look at your lungs from the inside and take testing (diagnostic) samples if needed. Tell a health care provider about:  Any allergies you have.  All medicines you are taking, including vitamins, herbs, eye drops, creams, and over-the-counter medicines.  Any problems you or family members have had with anesthetic medicines.  Any blood disorders you have.  Any surgeries you have had.  Any medical conditions you have.  Whether you are pregnant or may be pregnant. What are the risks? Generally, this is a safe procedure. However, problems may occur, including:  Infection.  Bleeding.  Damage to other structures or organs.  Allergic reactions to medicines.  Collapsed lung (pneumothorax).  Increased need for oxygen or difficulty breathing after the procedure. What happens before the procedure? Medicines Ask your health care provider about:  Changing or stopping your regular medicines. This is especially important if you are taking diabetes medicines or blood thinners.  Taking medicines such as aspirin and ibuprofen. These medicines can thin your blood. Do not take these medicines before your procedure if your health care provider instructs you not to. You may be given antibiotic medicine to help prevent infection. Staying hydrated Follow instructions from  your health care provider about hydration, which may include:  Up to 2 hours before the procedure - you may continue to drink clear liquids, such as water, clear fruit juice, black coffee, and plain tea. Eating and drinking Follow instructions from your health care provider about eating and drinking, which may include:  8 hours before the procedure - stop eating heavy meals or foods such as meat, fried foods, or fatty foods.  6 hours before the procedure - stop eating light meals or foods, such as toast or cereal.  6 hours before the procedure - stop drinking milk or drinks that contain milk.  2 hours before the procedure - stop drinking clear liquids. General instructions  Plan to have someone take you home from the hospital or clinic.  If you will be going home right after the procedure, plan to have someone with you for 24 hours. What happens during the procedure?  To lower your risk of infection: ? Your health care team will wash or sanitize their hands. ? Your skin will be washed with soap.  An IV tube will be inserted into one of your veins.  You will be given a medicine (local anesthetic) to numb your mouth, nose, throat, and voice box (larynx). You may also be given one or more of the following: ? A medicine to help you relax (sedative). ? A medicine to control coughing. ? A medicine to dry up any fluids in your lungs (secretions).  A bronchoscope will be passed into your nose or mouth, and into your lungs. Your health care provider will examine your lungs.  Samples of airway secretions may be collected for testing.  If abnormal areas are seen in your airways, tissue samples may be removed for examination under a microscope (biopsy).  If tissue samples are needed from the outer parts of the lung, a type of X-ray (fluoroscopy) may be used to guide the bronchoscope to these areas.  If bleeding occurs, you may be given medicine to stop or decrease the bleeding. The  procedure may vary among health care providers and hospitals. What happens after the procedure?  Do not drive for 24 hours if you were given a sedative.  Your blood pressure, heart rate, breathing rate, and blood oxygen level will be monitored until the medicines you were given have worn off.  You may have a chest X-ray to check for signs of pneumothorax.  You will not be allowed to eat or drink anything for 2 hours after your procedure.  If a biopsy was taken, it is up to you to get the results of your procedure. Ask your health care provider, or the department that is doing the procedure, when your results will be ready. Summary  Flexible bronchoscopy is a procedure that allows your health care provider to look closely at your lungs from the inside and take testing (diagnostic) samples if needed.  Risks of flexible bronchoscopy include bleeding, infection, and pneumothorax.  Before a flexible bronchoscopy, you will be given a medicine (local anesthetic) to numb your mouth, nose, throat, and voice box (larynx). Then, a bronchoscope will be passed into your nose or mouth, and into your lungs.  After the procedure, your blood pressure, heart rate,  breathing rate, and blood oxygen level will be monitored until the medicines you were given have worn off. You may have a chest X-ray to check for signs of pneumothorax.  You will not be allowed to eat or drink anything for 2 hours after your procedure. This information is not intended to replace advice given to you by your health care provider. Make sure you discuss any questions you have with your health care provider. Document Released: 08/22/2000 Document Revised: 08/07/2017 Document Reviewed: 09/27/2016 Elsevier Patient Education  2020 [employer].

## 2019-06-20 NOTE — Progress Notes (Signed)
[name]    [id number]    March 27, 1946  Primary Care Physician:Thekkekandam, Gwen Her, MD  Referring Physician: Silverio Decamp, [facility] [address] [place],  [place] [address]  Chief complaint:   Patient with a history of COPD Abnormal CT scan of the chest  HPI:  Is being seen for COPD -Symptoms are better controlled recently -Is on Trelegy, albuterol use as needed  Had a CT scan done about 5 weeks ago showing atelectasis He has had hemoptysis More recently very scant hemoptysis, dark, altered blood  Appetite is maintained, weight is maintained  He has chronic back pain  An active smoker down to about 3 to 4 cigarettes a day  Tolerates mild to moderate activity but restricted by his back  Worked with a lot of diesel fumes, dust  Outpatient Encounter Medications as of 06/20/2019  Medication Sig  . albuterol (PROVENTIL HFA;VENTOLIN HFA) 108 (90 Base) MCG/ACT inhaler Inhale 1 puff into the lungs every 6 (six) hours as needed for wheezing or shortness of breath.  [name] albuterol (PROVENTIL) (2.5 MG/3ML) 0.083% nebulizer solution Take 3 mLs (2.5 mg total) by nebulization every 6 (six) hours as needed for wheezing or shortness of breath.  [name] amiodarone (PACERONE) 200 MG tablet TAKE 1 TABLET BY MOUTH EVERY DAY. Please make yearly appt with Dr. [name] for October before anymore refills. 1st attempt  . aspirin EC 81 MG tablet Take 81 mg by mouth daily.  [name] atorvastatin (LIPITOR) 80 MG tablet Take 1 tablet (80 mg total) by mouth daily at 6 PM.  . busPIRone (BUSPAR) 10 MG tablet Take 1 tablet (10 mg total) by mouth 3 (three) times daily.  . cefUROXime (CEFTIN) 500 MG tablet TAKE 1 TABLET(500 MG) BY MOUTH TWICE DAILY WITH A MEAL  . docusate sodium (COLACE) 100 MG capsule Take 1 capsule (100 mg total) by mouth 3 (three) times daily as needed.  . doxycycline (VIBRA-TABS) 100 MG tablet TAKE 1 TABLET(100 MG) BY MOUTH EVERY 12 HOURS  . ferrous sulfate 325 (65  FE) MG EC tablet Take 1 tablet (325 mg total) by mouth 3 (three) times daily with meals.  . folic acid (FOLVITE) 510 MCG tablet Take 400 mcg by mouth daily.  . furosemide (LASIX) 20 MG tablet Take 1 tablet (20 mg total) by mouth daily as needed.  . nitroGLYCERIN (NITROSTAT) 0.4 MG SL tablet Place 1 tablet (0.4 mg total) under the tongue every 5 (five) minutes as needed for chest pain. Please keep upcoming appt in October. Thanks  . OXYGEN 2lpm with sleep  . polyethylene glycol (MIRALAX / GLYCOLAX) packet Take 17 g by mouth 2 (two) times daily. Until stooling regularly  . sacubitril-valsartan (ENTRESTO) 24-26 MG Take 1 tablet by mouth 2 (two) times daily.  [name] thiamine 100 MG tablet Take 1 tablet (100 mg total) by mouth daily.  . traZODone (DESYREL) 100 MG tablet Take 2 tablets (200 mg total) by mouth at bedtime.  . TRELEGY ELLIPTA 100-62.5-25 MCG/INH AEPB INHALE 1 PUFF INTO THE LUNGS DAILY  . venlafaxine XR (EFFEXOR-XR) 75 MG 24 hr capsule Take 3 capsules (225 mg total) by mouth daily with breakfast.  . [DISCONTINUED] azithromycin (ZITHROMAX Z-PAK) 250 MG tablet Take 2 tablets (500 mg) on  Day 1,  followed by 1 tablet (250 mg) once daily on Days 2 through 5.  . [DISCONTINUED] predniSONE (DELTASONE) 50 MG tablet One tab PO daily for 5 days.   No  facility-administered encounter medications on file as of 06/20/2019.     Allergies as of 06/20/2019  . (No Known Allergies)    Past Medical History:  Diagnosis Date  . AAA (abdominal aortic aneurysm) ([place])   . AAA (abdominal aortic aneurysm) without rupture ([place])   . Acute respiratory failure ([place]) 03/13/2017  . Anxiety   . Atrial fibrillation ([facility])     Brief episode of  . Atrial fibrillation with rapid ventricular response ([place]) 01/06/2016  . Cardiomyopathy, ischemic 10/27/2014  . CHF (congestive heart failure) ([place])   . COPD (chronic obstructive pulmonary disease) ([place])   . Coronary artery disease    Multivessel, S/p stenting to LCx. LHC  03/16/17: unchanged MVD- 50%mid LAD, 85% 1st daig, 100% prox circ, 100% prox RCA  . Depression   . Dyslipidemia   . Headache   . Hyperlipidemia   . Hypertension   . Myocardial infarction ([place])   . Peripheral vascular disease ([place])   . 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 Shortness of breath dyspnea   . Stroke ([place])   . Tobacco dependence   . Ventricular dysfunction    Mild Left    Past Surgical History:  Procedure Laterality Date  . ABDOMINAL AORTIC ANEURYSM REPAIR  01/03/2016   Procedure: ANEURYSM ABDOMINAL AORTIC REPAIR USING 14MM X 8MM X40CM HEMASHIELD GOLD GRAFT;  Surgeon: Conrad Malaga, MD;  Location: [facility] OR;  Service: Vascular;;  . ABDOMINAL AORTIC ENDOVASCULAR STENT GRAFT N/A 01/03/2016   Procedure: ABDOMINAL AORTIC ENDOVASCULAR STENT GRAFT IMPLANTED/EXPLANTED;  Surgeon: Conrad Godley, MD;  Location: [facility];  Service: Vascular;  Laterality: N/A;  . AXILLARY-FEMORAL BYPASS GRAFT Bilateral 09/08/2017   Procedure: BYPASS GRAFT AXILLA-BIFEMORAL;  Surgeon: Rosetta Posner, MD;  Location: [facility];  Service: Vascular;  Laterality: Bilateral;  . BACK SURGERY     cervical  . FALSE ANEURYSM REPAIR Bilateral 09/08/2017   Procedure: Bilateral Groin Exploration, Removal of Prosthetic graft. Vein Patch Closure of bilateral common femoral Arteries.;  Surgeon: Rosetta Posner, MD;  Location: [facility] OR;  Service: Vascular;  Laterality: Bilateral;  . HAND SURGERY Left    thumb surgery  . HERNIA REPAIR Right   . LEFT HEART CATH AND CORONARY ANGIOGRAPHY N/A 03/16/2017   Procedure: Left Heart Cath and Coronary Angiography;  Surgeon: Martinique, Peter M, MD;  Location: [facility] CV LAB;  Service: Cardiovascular;  Laterality: N/A;  . LEFT HEART CATHETERIZATION WITH CORONARY ANGIOGRAM N/A 10/27/2014   Procedure: LEFT HEART CATHETERIZATION WITH CORONARY ANGIOGRAM;  Surgeon: Blane Ohara, MD;  Location: [facility] CATH LAB;  Service: Cardiovascular;  Laterality: N/A;  . NECK SURGERY    . stent     pt unsure of location  . US  ECHOCARDIOGRAPHY  12-26-2008   EF 40-45%  . WRIST SURGERY Right     Family History  Problem Relation Age of Onset  . AAA (abdominal aortic aneurysm) Mother   . Cancer Father        bone marrow    Social History   Socioeconomic History  . Marital status: Widowed    Spouse name: Not on file  . Number of children: 1  . Years of education: Not on file  . Highest education level: Not on file  Occupational History  . Not on file  Social Needs  . Financial resource strain: Not on file  . Food insecurity    Worry: Not on file    Inability: Not on file  . Transportation needs  Medical: Not on file    Non-medical: Not on file  Tobacco Use  . Smoking status: Current Every Day Smoker    Packs/day: 0.50    Years: 20.00    Pack years: 10.00    Types: Cigarettes  . Smokeless tobacco: Never Used  . Tobacco comment: cutting back  Substance and Sexual Activity  . Alcohol use: No  . Drug use: No  . Sexual activity: Not Currently    Birth control/protection: None  Lifestyle  . Physical activity    Days per week: Not on file    Minutes per session: Not on file  . Stress: Not on file  Relationships  . Social Herbalist on phone: Not on file    Gets together: Not on file    Attends religious service: Not on file    Active member of club or organization: Not on file    Attends meetings of clubs or organizations: Not on file    Relationship status: Not on file  . Intimate partner violence    Fear of current or ex partner: Not on file    Emotionally abused: Not on file    Physically abused: Not on file    Forced sexual activity: Not on file  Other Topics Concern  . Not on file  Social History Narrative   ** Merged History Encounter **        Review of Systems  Constitutional: Negative.   HENT: Negative.   Respiratory: Positive for cough and shortness of breath.   Cardiovascular: Negative.   Gastrointestinal: Negative.   Musculoskeletal: Positive for  arthralgias and back pain.  Neurological: Negative.   Hematological: Negative.   Psychiatric/Behavioral: Negative.     Vitals:   06/20/19 1557  BP: 118/64  Pulse: 77  Temp: 97.8 F (36.6 C)  SpO2: 96%     Physical Exam  Constitutional: He is oriented to person, place, and time. He appears well-developed and well-nourished.  HENT:  Head: Normocephalic.  Eyes: Pupils are equal, round, and reactive to light. Right eye exhibits no discharge. Left eye exhibits no discharge.  Neck: Normal range of motion. Neck supple. No tracheal deviation present. No thyromegaly present.  Cardiovascular: Normal rate and regular rhythm.  Pulmonary/Chest: Effort normal. No respiratory distress. He has no wheezes. He has no rales.  Abdominal: Soft. Bowel sounds are normal. He exhibits no distension. There is no abdominal tenderness. There is no rebound.  Musculoskeletal: Normal range of motion.        General: No deformity or edema.  Neurological: He is alert and oriented to person, place, and time. No cranial nerve deficit.  Skin: Skin is warm and dry. No erythema.  Psychiatric: He has a normal mood and affect.   Data Reviewed: CT scan of the chest from 05/17/2019 shows atelectasis left lower lobe Nodular right lower lobe lesion noted Extensive emphysema  Last echocardiogram showed ejection fraction of 30 to 15% with systolic dysfunction  Last PFT 04/05/2018 with moderately severe obstructive lung disease  Assessment:  COPD class III -Continue Trelegy -Continue bronchodilators  Abnormal CT scan of the chest showing lung nodule and atelectasis left lower lobe -We will need further work-up -We will need repeat CT scan of the chest to plan for bronchoscopic evaluation  Hemoptysis -In someone with a significant history of smoking -Recurrent hemoptysis is concerning for an endobronchial mass lesion    Plan/Recommendations: Obtain CT scan of the chest with contrast-evaluate the mediastinum for  adenopathy,  evolution of atelectasis  Continue Trelegy Continue albuterol as needed  Smoking cessation counseling  Likelihood of a neoplastic process was discussed extensively with the patient  He does have other comorbidities, significant limitation with activities with his back pain   I will see him back in the office in about 6 to 8 weeks  We will schedule him for bronchoscopy   Sherrilyn Rist MD [facility] 06/20/2019, 4:42 PM  CC: Silverio Decamp,*

## 2019-06-21 LAB — BASIC METABOLIC PANEL
BUN: 16 mg/dL (ref 6–23)
CO2: 30 mEq/L (ref 19–32)
Calcium: 9.4 mg/dL (ref 8.4–10.5)
Chloride: 99 mEq/L (ref 96–112)
Creatinine, Ser: 0.76 mg/dL (ref 0.40–1.50)
GFR: 100.88 mL/min (ref >60.00)
Glucose, Bld: 80 mg/dL (ref 70–99)
Potassium: 4.4 mEq/L (ref 3.5–5.1)
Sodium: 137 mEq/L (ref 135–145)

## 2019-06-22 ENCOUNTER — Telehealth: Payer: Self-pay

## 2019-06-22 NOTE — Telephone Encounter (Signed)
I did speak with them extensively about possibilities of what we may find  If he decides to go ahead with the procedure, we can make it happen otherwise he has a CT scan ordered to follow-up on the atelectasis  They should let us know if he does not want to have the procedure, my understanding is that he is traveling out of state soon

## 2019-06-22 NOTE — Telephone Encounter (Signed)
Called and spoke to [place], daughter listed on [place].  Scheduled patient for pre-procedure covid testing. Katrina stated she is going to talk with her Dad but that he may not want to have covid or procedure. She stated she will call the office if this is something he wants to cancel. Nothing further needed at this time.

## 2019-06-22 NOTE — Telephone Encounter (Signed)
I called and spoke with Katie with respiratory services to schedule patient for a Bronch for Dr. Ander Slade. The patient is going out of town at the end of the month so he requested to have it November 9-13. We got it scheduled for 07/19/19 at 7:30am at [facility]. Both Dr. Ander Slade and Patient have been made aware and are ok with this. I advised the patient's daughter that he will need to be there at 5:30am the day of and that she can expect a call from our office to schedule Covid testing. I have confirmed with Joellen Jersey that this day is a go.

## 2019-06-23 NOTE — Telephone Encounter (Signed)
Called and spoke to patient's daughter, Katrina listed on [place].  Relayed message to her from Dr. Ander Slade and asked if she had any question. Daughter stated that she doesn't have questions and will call us. She was very abrupt with responses.

## 2019-06-24 ENCOUNTER — Ambulatory Visit: Payer: Medicare Other | Admitting: Sports Medicine

## 2019-06-27 ENCOUNTER — Other Ambulatory Visit: Payer: Medicare Other

## 2019-07-02 ENCOUNTER — Other Ambulatory Visit: Payer: Self-pay | Admitting: Physician Assistant

## 2019-07-03 ENCOUNTER — Other Ambulatory Visit: Payer: Self-pay | Admitting: Internal Medicine

## 2019-07-03 DIAGNOSIS — T827XXD Infection and inflammatory reaction due to other cardiac and vascular devices, implants and grafts, subsequent encounter: Secondary | ICD-10-CM

## 2019-07-04 ENCOUNTER — Other Ambulatory Visit: Payer: Self-pay | Admitting: *Deleted

## 2019-07-04 DIAGNOSIS — T827XXD Infection and inflammatory reaction due to other cardiac and vascular devices, implants and grafts, subsequent encounter: Secondary | ICD-10-CM

## 2019-07-04 MED ORDER — DOXYCYCLINE HYCLATE 100 MG PO TABS: ORAL_TABLET | ORAL | 4 refills | Status: DC

## 2019-07-04 MED ORDER — CEFUROXIME AXETIL 500 MG PO TABS: ORAL_TABLET | ORAL | 4 refills | Status: DC

## 2019-07-06 ENCOUNTER — Other Ambulatory Visit: Payer: Self-pay | Admitting: Physician Assistant

## 2019-07-11 ENCOUNTER — Other Ambulatory Visit: Payer: Medicare Other

## 2019-07-11 ENCOUNTER — Telehealth: Payer: Self-pay | Admitting: Pulmonary Disease

## 2019-07-11 NOTE — Telephone Encounter (Signed)
Call returned to patient daughter, she states she just wanted to cancel his appt she did not need a call back. She states he did want to do the Biopsy but he is afraid to take the covid test.   Made aware I would let AO know. Thanks.   Nothing further needed at this time.   Called RT department and cancelled Bronch per patient request.

## 2019-07-12 NOTE — Telephone Encounter (Signed)
Noted. Per BB nothing further needed

## 2019-07-12 NOTE — Telephone Encounter (Signed)
Aware  We already discussed potential of this being a cancerous process during his visit and he was leaning towards not doing anything about it.  Aware that it will progress without intervention.

## 2019-07-16 ENCOUNTER — Other Ambulatory Visit (HOSPITAL_COMMUNITY): Payer: Medicare Other | Attending: Pulmonary Disease

## 2019-07-19 ENCOUNTER — Encounter (HOSPITAL_COMMUNITY): Payer: Self-pay

## 2019-07-19 ENCOUNTER — Encounter (HOSPITAL_COMMUNITY): Payer: Medicare Other

## 2019-07-19 ENCOUNTER — Ambulatory Visit (HOSPITAL_COMMUNITY): Admit: 2019-07-19 | Payer: Medicare Other | Admitting: Pulmonary Disease

## 2019-07-19 SURGERY — BRONCHOSCOPY, WITH FLUOROSCOPY
Anesthesia: Moderate Sedation | Laterality: Bilateral

## 2019-07-20 ENCOUNTER — Other Ambulatory Visit: Payer: Self-pay | Admitting: Physician Assistant

## 2019-07-21 ENCOUNTER — Telehealth: Payer: Self-pay | Admitting: Cardiovascular Disease

## 2019-07-21 NOTE — Telephone Encounter (Signed)
Spoke with patient's daughter, Adonis Huguenin, who states she does not want patient to miss refills and is concerned about bringing him into the office. She states she is concerned about patient doing a virtual visit because she usually accompanies him to appointments and she will be at work. I advised that we can arrange to call her to review medications and get vital signs first, which she checks regularly, and then call the patient. She verbalized understanding and agreement with plan and thanked me for the call. Appointment is scheduled for 11/18 with Vin Bhagat, PA.   YOUR CARDIOLOGY TEAM HAS ARRANGED FOR AN E-VISIT FOR YOUR APPOINTMENT - PLEASE REVIEW IMPORTANT INFORMATION BELOW SEVERAL DAYS PRIOR TO YOUR APPOINTMENT   Due to the recent COVID-19 pandemic, we are transitioning in-person office visits to tele-medicine visits in an effort to decrease unnecessary exposure to our patients, their families, and staff. These visits are billed to your insurance just like a normal visit is. We also encourage you to sign up for MyChart if you have not already done so. You will need a smartphone if possible. For patients that do not have this, we can still complete the visit using a regular telephone but do prefer a smartphone to enable video when possible. You may have a family member that lives with you that can help. If possible, we also ask that you have a blood pressure cuff and scale at home to measure your blood pressure, heart rate and weight prior to your scheduled appointment. Patients with clinical needs that need an in-person evaluation and testing will still be able to come to the office if absolutely necessary. If you have any questions, feel free to call our office.    YOUR PROVIDER WILL BE USING THE FOLLOWING PLATFORM TO COMPLETE YOUR VISIT: TELEPHONE   . IF USING DOXIMITY or [email or web address].ME - The staff will give you instructions on receiving your link to join the meeting the day of your visit.    2-3 DAYS  BEFORE YOUR APPOINTMENT  You will receive a telephone call from one of our [facility] team members - your caller ID may say "Unknown caller." If this is a video visit, we will walk you through how to get the video launched on your phone. We will remind you check your blood pressure, heart rate and weight prior to your scheduled appointment. If you have an Apple Watch or Kardia, please upload any pertinent ECG strips the day before or morning of your appointment to [facility]. Our staff will also make sure you have reviewed the consent and agree to move forward with your scheduled tele-health visit.     THE DAY OF YOUR APPOINTMENT  Approximately 15 minutes prior to your scheduled appointment, you will receive a telephone call from one of [facility] team - your caller ID may say "Unknown caller."  Our staff will confirm medications, vital signs for the day and any symptoms you may be experiencing. Please have this information available prior to the time of visit start. It may also be helpful for you to have a pad of paper and pen handy for any instructions given during your visit. They will also walk you through joining the smartphone meeting if this is a video visit.    CONSENT FOR TELE-HEALTH VISIT - PLEASE REVIEW  I hereby voluntarily request, consent and authorize [facility] and its employed or contracted physicians, physician assistants, nurse practitioners or other licensed health care professionals (the Practitioner), to provide me with telemedicine health care  services (the "Services") as deemed necessary by the treating Practitioner. I acknowledge and consent to receive the Services by the Practitioner via telemedicine. I understand that the telemedicine visit will involve communicating with the Practitioner through live audiovisual communication technology and the disclosure of certain medical information by electronic transmission. I acknowledge that I have been given the opportunity to request  an in-person assessment or other available alternative prior to the telemedicine visit and am voluntarily participating in the telemedicine visit.  I understand that I have the right to withhold or withdraw my consent to the use of telemedicine in the course of my care at any time, without affecting my right to future care or treatment, and that the Practitioner or I may terminate the telemedicine visit at any time. I understand that I have the right to inspect all information obtained and/or recorded in the course of the telemedicine visit and may receive copies of available information for a reasonable fee.  I understand that some of the potential risks of receiving the Services via telemedicine include:  [name] Delay or interruption in medical evaluation due to technological equipment failure or disruption; . Information transmitted may not be sufficient (e.g. poor resolution of images) to allow for appropriate medical decision making by the Practitioner; and/or  . In rare instances, security protocols could fail, causing a breach of personal health information.  Furthermore, I acknowledge that it is my responsibility to provide information about my medical history, conditions and care that is complete and accurate to the best of my ability. I acknowledge that Practitioner's advice, recommendations, and/or decision may be based on factors not within their control, such as incomplete or inaccurate data provided by me or distortions of diagnostic images or specimens that may result from electronic transmissions. I understand that the practice of medicine is not an exact science and that Practitioner makes no warranties or guarantees regarding treatment outcomes. I acknowledge that I will receive a copy of this consent concurrently upon execution via email to the email address I last provided but may also request a printed copy by calling the office of [facility].    I understand that my insurance will be  billed for this visit.   I have read or had this consent read to me. . I understand the contents of this consent, which adequately explains the benefits and risks of the Services being provided via telemedicine.  . I have been provided ample opportunity to ask questions regarding this consent and the Services and have had my questions answered to my satisfaction. . I give my informed consent for the services to be provided through the use of telemedicine in my medical care  By participating in this telemedicine visit I agree to the above.

## 2019-07-21 NOTE — Telephone Encounter (Signed)
Pt c/o medication issue:  1. Name of Medication: atorvastatin (LIPITOR) 80 MG tablet  2. How are you currently taking this medication (dosage and times per day)? As directed  3. Are you having a reaction (difficulty breathing--STAT)? no  4. What is your medication issue? Pharmacy denied Rx  Daughter of the patient called. The daughter is trying to get all of the patient's prescriptions on track so they all get picked up on the 23rd of the month. This rx was denied because the patient needs to be seen by the office. The daughter states that the patient is reluctant to come into the office because of being high risk for COVID. The daughter ends up having to wait 2 days and pick up this medicine later because the pharmacy ends up needing approval from our office.  She would really just like a new rx with refills to be sent to [id number].   Please call her if the office needs to discuss this further. The daughter organizes all of his meds, and just takes what she puts in his daily pill box

## 2019-07-26 NOTE — Progress Notes (Signed)
Virtual Visit via Video Note   This visit type was conducted due to national recommendations for restrictions regarding the COVID-19 Pandemic (e.g. social distancing) in an effort to limit this patient's exposure and mitigate transmission in our community.  Due to his co-morbid illnesses, this patient is at least at moderate risk for complications without adequate follow up.  This format is felt to be most appropriate for this patient at this time.  All issues noted in this document were discussed and addressed.  A limited physical exam was performed with this format.  Please refer to the patient's chart for his consent to telehealth for [facility].   Date:  07/27/2019   ID:  [name], DOB 06/23/1946, MRN [id number]  Patient Location: Home Provider Location: Home  PCP:  Silverio Decamp, MD  Cardiologist:  Mertie Moores, MD  Electrophysiologist:  None   Evaluation Performed:  Follow-Up Visit  Chief Complaint:  Yearly follow up  History of Present Illness:    [name] is a 73 y.o. male with coronary artery disease status post prior PCI of the LCx and RCA, ICM/chronic systolic heart failure, atrial fibrillation, AAA status post repair in 2017, COPD, tobacco abuse, hyperlipidemia, anemia and ventricular tachycardia (seen by Dr. Caryl Comes patient declined ICD) seen for follow up.   He was admitted 03/2017 with acute respiratory failure and was intubated.  He had elevated troponins and underwent cardiac catheterization.  This demonstrated an occluded proximal LCx and an occluded proximal RCA.  He had high-grade stenosis in the first diagonal and moderate nonobstructive disease in the mid LAD.  There were left-to-right collaterals.  It was felt that his elevated troponin was related to demand ischemia.  Medical therapy was recommended.  PCI of the diagonal was not recommended unless he had refractory angina.  He had inguinal hernia repair in January 2019.  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 He has had significant anemia in the past and his anticoagulation was stopped.  CHADS2-VASc=4 (CHF, HTN, CAD, age x 1).   He was doing relatively well when last seen by [name] 06/2018.  Seen virtually for follow up.  Patient denies chest pain, dizziness, palpitation, orthopnea, PND, syncope, lower extremity edema or melena.  He needs refill of his medication.  No regular exercise but does household chores without any significant problem.  He has chronic shortness of breath secondary to COPD and ongoing tobacco smoking.  The patient does not have symptoms concerning for COVID-19 infection (fever, chills, cough, or new shortness of breath).    Past Medical History:  Diagnosis Date   AAA (abdominal aortic aneurysm) (HCC)    AAA (abdominal aortic aneurysm) without rupture (HCC)    Acute respiratory failure (HCC) 03/13/2017   Anxiety    Atrial fibrillation (HCC)     Brief episode of   Atrial fibrillation with rapid ventricular response ([place]) 01/06/2016   Cardiomyopathy, ischemic 10/27/2014   CHF (congestive heart failure) (HCC)    COPD (chronic obstructive pulmonary disease) ([place])    Coronary artery disease    Multivessel, S/p stenting to LCx. 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, MD;  Location: [facility] OR;  Service:  Vascular;;   ABDOMINAL AORTIC ENDOVASCULAR STENT GRAFT N/A 01/03/2016   Procedure: ABDOMINAL AORTIC ENDOVASCULAR STENT GRAFT IMPLANTED/EXPLANTED;  Surgeon: Conrad Blo[place], MD;  Location: [facility] OR;  Service: Vascular;  Laterality: N/A;   AXILLARY-FEMORAL BYPASS GRAFT Bilateral 09/08/2017   Procedure: BYPASS GRAFT AXILLA-BIFEMORAL;  Surgeon: Rosetta Posner, MD;  Location: [place];  Service: Vascular;  Laterality: Bilateral;   BACK SURGERY     cervical   FALSE ANEURYSM REPAIR Bilateral 09/08/2017   Procedure: Bilateral Groin Exploration, Removal of Prosthetic graft. Vein Patch Closure of bilateral common femoral Arteries.;  Surgeon: Rosetta Posner, MD;  Location: [facility] OR;  Service: Vascular;  Laterality: Bilateral;   HAND SURGERY Left    thumb surgery   HERNIA REPAIR Right    LEFT HEART CATH AND CORONARY ANGIOGRAPHY N/A 03/16/2017   Procedure: Left Heart Cath and Coronary Angiography;  Surgeon: Martinique, Peter M, MD;  Location: [facility] CV LAB;  Service: Cardiovascular;  Laterality: N/A;   LEFT HEART CATHETERIZATION WITH CORONARY ANGIOGRAM N/A 10/27/2014   Procedure: LEFT HEART CATHETERIZATION WITH CORONARY ANGIOGRAM;  Surgeon: Blane Ohara, MD;  Location: [facility] CATH LAB;  Service: Cardiovascular;  Laterality: N/A;   NECK SURGERY     stent     pt unsure of location   US ECHOCARDIOGRAPHY  12-26-2008   EF 40-45%   WRIST SURGERY Right      Current Meds  Medication Sig   albuterol (PROVENTIL HFA;VENTOLIN HFA) 108 (90 Base) MCG/ACT inhaler Inhale 1 puff into the lungs every 6 (six) hours as needed for wheezing or shortness of breath.   albuterol (PROVENTIL) (2.5 MG/3ML) 0.083% nebulizer solution Take 3 mLs (2.5 mg total) by nebulization every 6 (six) hours as needed for wheezing or shortness of breath.   amiodarone (PACERONE) 200 MG tablet TAKE 1 TABLET BY MOUTH EVERY DAY   aspirin EC 81 MG tablet Take 81 mg by mouth daily.   atorvastatin (LIPITOR) 80 MG tablet TAKE 1 TABLET(80 MG) BY MOUTH  DAILY AT 6 PM   busPIRone (BUSPAR) 10 MG tablet Take 1 tablet (10 mg total) by mouth 3 (three) times daily.   cefUROXime (CEFTIN) 500 MG tablet TAKE 1 TABLET(500 MG) BY MOUTH TWICE DAILY WITH A MEAL   docusate sodium (COLACE) 100 MG capsule Take 1 capsule (100 mg total) by mouth 3 (three) times daily as needed.   doxycycline (VIBRA-TABS) 100 MG tablet TAKE 1 TABLET(100 MG) BY MOUTH EVERY 12 HOURS   ferrous sulfate 325 (65 FE) MG EC tablet Take 1 tablet (325 mg total) by mouth 3 (three) times daily with meals.   folic acid (FOLVITE) 300 MCG tablet Take 400 mcg by mouth daily.   furosemide (LASIX) 20 MG tablet Take 1 tablet (20 mg total) by mouth daily as needed.   nitroGLYCERIN (NITROSTAT) 0.4 MG SL tablet Place 1 tablet (0.4 mg total) under the tongue every 5 (five) minutes as needed for chest pain. Please keep upcoming appt in October. Thanks   OXYGEN 2lpm with sleep   polyethylene glycol (MIRALAX / GLYCOLAX) packet Take 17 g by mouth  2 (two) times daily. Until stooling regularly   sacubitril-valsartan (ENTRESTO) 24-26 MG Take 1 tablet by mouth 2 (two) times daily.   thiamine 100 MG tablet Take 1 tablet (100 mg total) by mouth daily.   traZODone (DESYREL) 100 MG tablet Take 2 tablets (200 mg total) by mouth at bedtime.   TRELEGY ELLIPTA 100-62.5-25 MCG/INH AEPB INHALE 1 PUFF INTO THE LUNGS DAILY   venlafaxine XR (EFFEXOR-XR) 75 MG 24 hr capsule Take 3 capsules (225 mg total) by mouth daily with breakfast.     Allergies:   Patient has no known allergies.   Social History   Tobacco Use   Smoking status: Current Every Day Smoker    Packs/day: 0.50    Years: 20.00    Pack years: 10.00    Types: Cigarettes   Smokeless tobacco: Never Used   Tobacco comment: cutting back  Substance Use Topics   Alcohol use: No   Drug use: No     Family Hx: The patient's family history includes AAA (abdominal aortic aneurysm) in his mother; Cancer in his father.  ROS:   Please see  the history of present illness.    All other systems reviewed and are negative.   Prior CV studies:   The following studies were reviewed today:  As above  Labs/Other Tests and Data Reviewed:    EKG:  No ECG reviewed.  Recent Labs: 12/07/2018: ALT 14; Hemoglobin 13.6; Platelets 210; TSH 1.50 06/20/2019: BUN 16; Creatinine, Ser 0.76; Potassium 4.4; Sodium 137   Recent Lipid Panel Lab Results  Component Value Date/Time   CHOL 114 03/03/2018 10:30 AM   TRIG 130 03/03/2018 10:30 AM   HDL 44 03/03/2018 10:30 AM   CHOLHDL 2.6 03/03/2018 10:30 AM   LDLCALC 49 03/03/2018 10:30 AM    Wt Readings from Last 3 Encounters:  07/27/19 120 lb (54.4 kg)  06/20/19 119 lb 9.6 oz (54.3 kg)  05/10/19 120 lb (54.4 kg)     Objective:    Vital Signs:  BP 110/72    Ht 5\' 8"  (1.727 m)    Wt 120 lb (54.4 kg)    BMI 18.25 kg/m    VITAL SIGNS:  reviewed GEN:  no acute distress PSYCH:  normal affect  ASSESSMENT & PLAN:    1. CAD No angina. Continue current medical therpay.  2. HTN - BP stable on current medications  3. ICM/chronic systolic CHF - No CHF symptoms.   COVID-19 Education: The signs and symptoms of COVID-19 were discussed with the patient and how to seek care for testing (follow up with PCP or arrange E-visit).  The importance of social distancing was discussed today.  Time:   Today, I have spent 8 minutes with the patient with telehealth technology discussing the above problems.     Medication Adjustments/Labs and Tests Ordered: Current medicines are reviewed at length with the patient today.  Concerns regarding medicines are outlined above.   Tests Ordered: No orders of the defined types were placed in this encounter.   Medication Changes: No orders of the defined types were placed in this encounter.   Follow Up:  Either In Person or Virtual in 6 month(s)  Signed, Leanor Kail, [place]  07/27/2019 3:06 PM    [place]

## 2019-07-27 ENCOUNTER — Other Ambulatory Visit: Payer: Self-pay

## 2019-07-27 ENCOUNTER — Telehealth: Payer: Self-pay

## 2019-07-27 ENCOUNTER — Encounter: Payer: Self-pay | Admitting: Physician Assistant

## 2019-07-27 ENCOUNTER — Other Ambulatory Visit: Payer: Self-pay | Admitting: *Deleted

## 2019-07-27 ENCOUNTER — Other Ambulatory Visit: Payer: Self-pay | Admitting: Internal Medicine

## 2019-07-27 ENCOUNTER — Telehealth (INDEPENDENT_AMBULATORY_CARE_PROVIDER_SITE_OTHER): Payer: Medicare Other | Admitting: Physician Assistant

## 2019-07-27 VITALS — BP 110/72 | Ht 68.0 in | Wt 120.0 lb

## 2019-07-27 DIAGNOSIS — I5022 Chronic systolic (congestive) heart failure: Secondary | ICD-10-CM

## 2019-07-27 DIAGNOSIS — F329 Major depressive disorder, single episode, unspecified: Secondary | ICD-10-CM

## 2019-07-27 DIAGNOSIS — I739 Peripheral vascular disease, unspecified: Secondary | ICD-10-CM

## 2019-07-27 DIAGNOSIS — I4819 Other persistent atrial fibrillation: Secondary | ICD-10-CM

## 2019-07-27 DIAGNOSIS — I25118 Atherosclerotic heart disease of native coronary artery with other forms of angina pectoris: Secondary | ICD-10-CM

## 2019-07-27 DIAGNOSIS — Z7189 Other specified counseling: Secondary | ICD-10-CM

## 2019-07-27 DIAGNOSIS — F419 Anxiety disorder, unspecified: Secondary | ICD-10-CM

## 2019-07-27 DIAGNOSIS — F32A Depression, unspecified: Secondary | ICD-10-CM

## 2019-07-27 DIAGNOSIS — J449 Chronic obstructive pulmonary disease, unspecified: Secondary | ICD-10-CM

## 2019-07-27 MED ORDER — BUSPIRONE HCL 10 MG PO TABS: 10.0000 mg | ORAL_TABLET | Freq: Three times a day (TID) | ORAL | 3 refills | Status: DC

## 2019-07-27 NOTE — Telephone Encounter (Signed)

## 2019-07-27 NOTE — Patient Instructions (Signed)
Medication Instructions:  Your physician recommends that you continue on your current medications as directed. Please refer to the Current Medication list given to you today.  *If you need a refill on your cardiac medications before your next appointment, please call your pharmacy*  Lab Work: none If you have labs (blood work) drawn today and your tests are completely normal, you will receive your results only by: [name] MyChart Message (if you have MyChart) OR . A paper copy in the mail If you have any lab test that is abnormal or we need to change your treatment, we will call you to review the results.  Testing/Procedures: none  Follow-Up: At [facility], you and your health needs are our priority.  As part of our continuing mission to provide you with exceptional heart care, we have created designated Provider Care Teams.  These Care Teams include your primary Cardiologist (physician) and Advanced Practice Providers (APPs -  Physician Assistants and Nurse Practitioners) who all work together to provide you with the care you need, when you need it.  Your next appointment:   6 month(s)  The format for your next appointment:   In Person  Provider:   You may see Mertie Moores, MD or one of the following Advanced Practice Providers on your designated Care Team:    [name], PA-C  [place], [place]  Daune Perch, NP   Other Instructions

## 2019-07-28 ENCOUNTER — Other Ambulatory Visit: Payer: Self-pay | Admitting: Cardiovascular Disease

## 2019-07-28 DIAGNOSIS — I4819 Other persistent atrial fibrillation: Secondary | ICD-10-CM

## 2019-07-28 MED ORDER — AMIODARONE HCL 200 MG PO TABS: ORAL_TABLET | ORAL | 3 refills | Status: DC

## 2019-07-28 MED ORDER — ATORVASTATIN CALCIUM 80 MG PO TABS: ORAL_TABLET | ORAL | 3 refills | Status: DC

## 2019-07-28 NOTE — Telephone Encounter (Signed)
Pt's medication was sent to pt's pharmacy as requested. Confirmation received.  °

## 2019-08-22 ENCOUNTER — Other Ambulatory Visit: Payer: Self-pay

## 2019-08-22 ENCOUNTER — Ambulatory Visit (INDEPENDENT_AMBULATORY_CARE_PROVIDER_SITE_OTHER): Payer: Medicare Other | Admitting: Internal Medicine

## 2019-08-22 DIAGNOSIS — T827XXD Infection and inflammatory reaction due to other cardiac and vascular devices, implants and grafts, subsequent encounter: Secondary | ICD-10-CM | POA: Diagnosis not present

## 2019-08-22 NOTE — Progress Notes (Signed)
Virtual Visit via Telephone Note  I connected with [name] on 08/22/19 at  1:45 PM EST by telephone and verified that I am speaking with the correct person using two identifiers.  Location: Patient: Home Provider: [facility]   I discussed the limitations, risks, security and privacy concerns of performing an evaluation and management service by telephone and the availability of in person appointments. I also discussed with the patient that there may be a patient responsible charge related to this service. The patient expressed understanding and agreed to proceed.   History of Present Illness: I spoke to [name] by phone today.  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  He is not having any problems tolerating his antibiotics.  He has not had any fever, nausea, vomiting or diarrhea.   Observations/Objective:   Assessment and Plan: He has no evidence of recurrent infection and is tolerating chronic, suppressive empiric antibiotics.  He prefers to stay on them for now.  Follow Up Instructions: Continue doxycycline and cefuroxime Follow-up here in 6 months   I discussed the assessment and treatment plan with the patient. The patient was provided an opportunity to ask questions and all were answered. The patient agreed with the plan and demonstrated an understanding of the instructions.   The patient was advised to call back or seek an in-person evaluation if the symptoms worsen or if the condition fails to improve as anticipated.  I provided 8 minutes of non-face-to-face time during this encounter.   Michel Bickers, MD

## 2019-08-29 ENCOUNTER — Other Ambulatory Visit: Payer: Self-pay

## 2019-08-29 DIAGNOSIS — I5022 Chronic systolic (congestive) heart failure: Secondary | ICD-10-CM

## 2019-08-29 MED ORDER — ENTRESTO 24-26 MG PO TABS: 1.0000 | ORAL_TABLET | Freq: Two times a day (BID) | ORAL | 3 refills | Status: DC

## 2019-09-05 ENCOUNTER — Other Ambulatory Visit: Payer: Self-pay | Admitting: Sports Medicine

## 2019-09-05 DIAGNOSIS — I5022 Chronic systolic (congestive) heart failure: Secondary | ICD-10-CM

## 2019-09-05 MED ORDER — ENTRESTO 24-26 MG PO TABS: 1.0000 | ORAL_TABLET | Freq: Two times a day (BID) | ORAL | 6 refills | Status: AC

## 2019-09-08 ENCOUNTER — Ambulatory Visit: Payer: Medicare Other | Admitting: Sports Medicine

## 2019-10-03 DIAGNOSIS — R042 Hemoptysis: Secondary | ICD-10-CM | POA: Diagnosis not present

## 2019-10-03 DIAGNOSIS — R5383 Other fatigue: Secondary | ICD-10-CM | POA: Diagnosis not present

## 2019-10-15 DIAGNOSIS — Z681 Body mass index (BMI) 19 or less, adult: Secondary | ICD-10-CM | POA: Diagnosis not present

## 2019-10-15 DIAGNOSIS — E440 Moderate protein-calorie malnutrition: Secondary | ICD-10-CM | POA: Diagnosis not present

## 2019-10-15 DIAGNOSIS — R0602 Shortness of breath: Secondary | ICD-10-CM | POA: Diagnosis not present

## 2019-10-15 DIAGNOSIS — I5022 Chronic systolic (congestive) heart failure: Secondary | ICD-10-CM | POA: Diagnosis not present

## 2019-10-15 DIAGNOSIS — I252 Old myocardial infarction: Secondary | ICD-10-CM | POA: Diagnosis not present

## 2019-10-15 DIAGNOSIS — C3432 Malignant neoplasm of lower lobe, left bronchus or lung: Secondary | ICD-10-CM | POA: Diagnosis not present

## 2019-10-15 DIAGNOSIS — F17210 Nicotine dependence, cigarettes, uncomplicated: Secondary | ICD-10-CM | POA: Diagnosis present

## 2019-10-15 DIAGNOSIS — R918 Other nonspecific abnormal finding of lung field: Secondary | ICD-10-CM | POA: Diagnosis not present

## 2019-10-15 DIAGNOSIS — F17200 Nicotine dependence, unspecified, uncomplicated: Secondary | ICD-10-CM | POA: Diagnosis not present

## 2019-10-15 DIAGNOSIS — Z9981 Dependence on supplemental oxygen: Secondary | ICD-10-CM | POA: Diagnosis not present

## 2019-10-15 DIAGNOSIS — R0789 Other chest pain: Secondary | ICD-10-CM | POA: Diagnosis not present

## 2019-10-15 DIAGNOSIS — Z720 Tobacco use: Secondary | ICD-10-CM | POA: Diagnosis not present

## 2019-10-15 DIAGNOSIS — R0902 Hypoxemia: Secondary | ICD-10-CM | POA: Diagnosis not present

## 2019-10-15 DIAGNOSIS — R079 Chest pain, unspecified: Secondary | ICD-10-CM | POA: Diagnosis not present

## 2019-10-15 DIAGNOSIS — I429 Cardiomyopathy, unspecified: Secondary | ICD-10-CM | POA: Diagnosis not present

## 2019-10-15 DIAGNOSIS — J449 Chronic obstructive pulmonary disease, unspecified: Secondary | ICD-10-CM | POA: Diagnosis not present

## 2019-10-15 DIAGNOSIS — J90 Pleural effusion, not elsewhere classified: Secondary | ICD-10-CM | POA: Diagnosis not present

## 2019-10-15 DIAGNOSIS — R0600 Dyspnea, unspecified: Secondary | ICD-10-CM | POA: Diagnosis not present

## 2019-10-15 DIAGNOSIS — I10 Essential (primary) hypertension: Secondary | ICD-10-CM | POA: Diagnosis not present

## 2019-10-15 DIAGNOSIS — R042 Hemoptysis: Secondary | ICD-10-CM | POA: Diagnosis not present

## 2019-10-15 DIAGNOSIS — I2510 Atherosclerotic heart disease of native coronary artery without angina pectoris: Secondary | ICD-10-CM | POA: Diagnosis not present

## 2019-10-18 DIAGNOSIS — C3432 Malignant neoplasm of lower lobe, left bronchus or lung: Secondary | ICD-10-CM | POA: Diagnosis not present

## 2019-10-18 DIAGNOSIS — R918 Other nonspecific abnormal finding of lung field: Secondary | ICD-10-CM | POA: Diagnosis not present

## 2019-10-18 DIAGNOSIS — R042 Hemoptysis: Secondary | ICD-10-CM | POA: Diagnosis not present

## 2019-10-25 ENCOUNTER — Other Ambulatory Visit: Payer: Self-pay | Admitting: Sports Medicine

## 2019-10-25 ENCOUNTER — Other Ambulatory Visit: Payer: Self-pay | Admitting: Internal Medicine

## 2019-10-25 DIAGNOSIS — F329 Major depressive disorder, single episode, unspecified: Secondary | ICD-10-CM

## 2019-10-25 DIAGNOSIS — I4819 Other persistent atrial fibrillation: Secondary | ICD-10-CM

## 2019-10-25 DIAGNOSIS — F419 Anxiety disorder, unspecified: Secondary | ICD-10-CM

## 2019-10-25 DIAGNOSIS — F32A Depression, unspecified: Secondary | ICD-10-CM

## 2019-10-28 DIAGNOSIS — R918 Other nonspecific abnormal finding of lung field: Secondary | ICD-10-CM | POA: Diagnosis not present

## 2019-10-28 DIAGNOSIS — E782 Mixed hyperlipidemia: Secondary | ICD-10-CM | POA: Diagnosis not present

## 2019-10-28 DIAGNOSIS — I208 Other forms of angina pectoris: Secondary | ICD-10-CM | POA: Diagnosis not present

## 2019-10-28 DIAGNOSIS — I10 Essential (primary) hypertension: Secondary | ICD-10-CM | POA: Diagnosis not present

## 2019-10-28 DIAGNOSIS — I429 Cardiomyopathy, unspecified: Secondary | ICD-10-CM | POA: Diagnosis not present

## 2019-10-28 DIAGNOSIS — R0602 Shortness of breath: Secondary | ICD-10-CM | POA: Diagnosis not present

## 2019-10-28 DIAGNOSIS — I25118 Atherosclerotic heart disease of native coronary artery with other forms of angina pectoris: Secondary | ICD-10-CM | POA: Diagnosis not present

## 2019-11-02 DIAGNOSIS — Z0389 Encounter for observation for other suspected diseases and conditions ruled out: Secondary | ICD-10-CM | POA: Diagnosis not present

## 2019-11-02 DIAGNOSIS — R519 Headache, unspecified: Secondary | ICD-10-CM | POA: Diagnosis not present

## 2019-11-02 DIAGNOSIS — C3432 Malignant neoplasm of lower lobe, left bronchus or lung: Secondary | ICD-10-CM | POA: Diagnosis not present

## 2019-11-04 DIAGNOSIS — Z0389 Encounter for observation for other suspected diseases and conditions ruled out: Secondary | ICD-10-CM | POA: Diagnosis not present

## 2019-11-04 DIAGNOSIS — C3432 Malignant neoplasm of lower lobe, left bronchus or lung: Secondary | ICD-10-CM | POA: Diagnosis not present

## 2019-11-07 DIAGNOSIS — R05 Cough: Secondary | ICD-10-CM | POA: Diagnosis not present

## 2019-11-07 DIAGNOSIS — C3432 Malignant neoplasm of lower lobe, left bronchus or lung: Secondary | ICD-10-CM | POA: Diagnosis not present

## 2019-11-07 DIAGNOSIS — Z79899 Other long term (current) drug therapy: Secondary | ICD-10-CM | POA: Diagnosis not present

## 2019-11-07 DIAGNOSIS — Z515 Encounter for palliative care: Secondary | ICD-10-CM | POA: Diagnosis not present

## 2019-11-07 DIAGNOSIS — R918 Other nonspecific abnormal finding of lung field: Secondary | ICD-10-CM | POA: Diagnosis not present

## 2019-11-07 DIAGNOSIS — R52 Pain, unspecified: Secondary | ICD-10-CM | POA: Diagnosis not present

## 2019-11-07 DIAGNOSIS — Z08 Encounter for follow-up examination after completed treatment for malignant neoplasm: Secondary | ICD-10-CM | POA: Diagnosis not present

## 2019-11-10 DIAGNOSIS — C3432 Malignant neoplasm of lower lobe, left bronchus or lung: Secondary | ICD-10-CM | POA: Diagnosis not present

## 2019-11-10 DIAGNOSIS — Z1152 Encounter for screening for COVID-19: Secondary | ICD-10-CM | POA: Diagnosis not present

## 2019-11-10 DIAGNOSIS — Z20822 Contact with and (suspected) exposure to covid-19: Secondary | ICD-10-CM | POA: Diagnosis not present

## 2019-11-22 DIAGNOSIS — R5383 Other fatigue: Secondary | ICD-10-CM | POA: Diagnosis not present

## 2019-11-22 DIAGNOSIS — I2510 Atherosclerotic heart disease of native coronary artery without angina pectoris: Secondary | ICD-10-CM | POA: Diagnosis not present

## 2019-11-22 DIAGNOSIS — K219 Gastro-esophageal reflux disease without esophagitis: Secondary | ICD-10-CM | POA: Diagnosis not present

## 2019-11-22 DIAGNOSIS — J449 Chronic obstructive pulmonary disease, unspecified: Secondary | ICD-10-CM | POA: Diagnosis not present

## 2019-11-22 DIAGNOSIS — C3432 Malignant neoplasm of lower lobe, left bronchus or lung: Secondary | ICD-10-CM | POA: Diagnosis not present

## 2019-11-22 DIAGNOSIS — F329 Major depressive disorder, single episode, unspecified: Secondary | ICD-10-CM | POA: Diagnosis not present

## 2019-11-22 DIAGNOSIS — R531 Weakness: Secondary | ICD-10-CM | POA: Diagnosis not present

## 2019-11-22 DIAGNOSIS — R0602 Shortness of breath: Secondary | ICD-10-CM | POA: Diagnosis not present

## 2019-11-22 DIAGNOSIS — G4700 Insomnia, unspecified: Secondary | ICD-10-CM | POA: Diagnosis not present

## 2019-11-22 DIAGNOSIS — Z87891 Personal history of nicotine dependence: Secondary | ICD-10-CM | POA: Diagnosis not present

## 2019-11-24 ENCOUNTER — Other Ambulatory Visit: Payer: Self-pay | Admitting: Sports Medicine

## 2019-11-24 DIAGNOSIS — C3432 Malignant neoplasm of lower lobe, left bronchus or lung: Secondary | ICD-10-CM | POA: Diagnosis not present

## 2019-11-24 DIAGNOSIS — F32A Depression, unspecified: Secondary | ICD-10-CM

## 2019-11-24 DIAGNOSIS — Z87891 Personal history of nicotine dependence: Secondary | ICD-10-CM | POA: Diagnosis not present

## 2019-11-24 DIAGNOSIS — F419 Anxiety disorder, unspecified: Secondary | ICD-10-CM

## 2019-11-24 DIAGNOSIS — F329 Major depressive disorder, single episode, unspecified: Secondary | ICD-10-CM

## 2019-11-24 DIAGNOSIS — G4700 Insomnia, unspecified: Secondary | ICD-10-CM | POA: Diagnosis not present

## 2019-11-24 DIAGNOSIS — K219 Gastro-esophageal reflux disease without esophagitis: Secondary | ICD-10-CM | POA: Diagnosis not present

## 2019-11-24 DIAGNOSIS — I2510 Atherosclerotic heart disease of native coronary artery without angina pectoris: Secondary | ICD-10-CM | POA: Diagnosis not present

## 2019-11-24 MED ORDER — BUSPIRONE HCL 10 MG PO TABS: 10.0000 mg | ORAL_TABLET | Freq: Three times a day (TID) | ORAL | 3 refills | Status: AC

## 2019-12-01 DIAGNOSIS — Z87891 Personal history of nicotine dependence: Secondary | ICD-10-CM | POA: Diagnosis not present

## 2019-12-01 DIAGNOSIS — G4700 Insomnia, unspecified: Secondary | ICD-10-CM | POA: Diagnosis not present

## 2019-12-01 DIAGNOSIS — I2510 Atherosclerotic heart disease of native coronary artery without angina pectoris: Secondary | ICD-10-CM | POA: Diagnosis not present

## 2019-12-01 DIAGNOSIS — K219 Gastro-esophageal reflux disease without esophagitis: Secondary | ICD-10-CM | POA: Diagnosis not present

## 2019-12-01 DIAGNOSIS — C3432 Malignant neoplasm of lower lobe, left bronchus or lung: Secondary | ICD-10-CM | POA: Diagnosis not present

## 2019-12-01 DIAGNOSIS — F329 Major depressive disorder, single episode, unspecified: Secondary | ICD-10-CM | POA: Diagnosis not present

## 2019-12-02 DIAGNOSIS — K219 Gastro-esophageal reflux disease without esophagitis: Secondary | ICD-10-CM | POA: Diagnosis not present

## 2019-12-02 DIAGNOSIS — Z87891 Personal history of nicotine dependence: Secondary | ICD-10-CM | POA: Diagnosis not present

## 2019-12-02 DIAGNOSIS — C3432 Malignant neoplasm of lower lobe, left bronchus or lung: Secondary | ICD-10-CM | POA: Diagnosis not present

## 2019-12-02 DIAGNOSIS — I2510 Atherosclerotic heart disease of native coronary artery without angina pectoris: Secondary | ICD-10-CM | POA: Diagnosis not present

## 2019-12-02 DIAGNOSIS — G4700 Insomnia, unspecified: Secondary | ICD-10-CM | POA: Diagnosis not present

## 2019-12-02 DIAGNOSIS — F329 Major depressive disorder, single episode, unspecified: Secondary | ICD-10-CM | POA: Diagnosis not present

## 2019-12-06 DIAGNOSIS — G4700 Insomnia, unspecified: Secondary | ICD-10-CM | POA: Diagnosis not present

## 2019-12-06 DIAGNOSIS — C3432 Malignant neoplasm of lower lobe, left bronchus or lung: Secondary | ICD-10-CM | POA: Diagnosis not present

## 2019-12-06 DIAGNOSIS — K219 Gastro-esophageal reflux disease without esophagitis: Secondary | ICD-10-CM | POA: Diagnosis not present

## 2019-12-06 DIAGNOSIS — I2510 Atherosclerotic heart disease of native coronary artery without angina pectoris: Secondary | ICD-10-CM | POA: Diagnosis not present

## 2019-12-06 DIAGNOSIS — Z87891 Personal history of nicotine dependence: Secondary | ICD-10-CM | POA: Diagnosis not present

## 2019-12-06 DIAGNOSIS — F329 Major depressive disorder, single episode, unspecified: Secondary | ICD-10-CM | POA: Diagnosis not present

## 2019-12-08 DIAGNOSIS — R5383 Other fatigue: Secondary | ICD-10-CM | POA: Diagnosis not present

## 2019-12-08 DIAGNOSIS — R0602 Shortness of breath: Secondary | ICD-10-CM | POA: Diagnosis not present

## 2019-12-08 DIAGNOSIS — F329 Major depressive disorder, single episode, unspecified: Secondary | ICD-10-CM | POA: Diagnosis not present

## 2019-12-08 DIAGNOSIS — G4700 Insomnia, unspecified: Secondary | ICD-10-CM | POA: Diagnosis not present

## 2019-12-08 DIAGNOSIS — J449 Chronic obstructive pulmonary disease, unspecified: Secondary | ICD-10-CM | POA: Diagnosis not present

## 2019-12-08 DIAGNOSIS — K219 Gastro-esophageal reflux disease without esophagitis: Secondary | ICD-10-CM | POA: Diagnosis not present

## 2019-12-08 DIAGNOSIS — Z87891 Personal history of nicotine dependence: Secondary | ICD-10-CM | POA: Diagnosis not present

## 2019-12-08 DIAGNOSIS — I2510 Atherosclerotic heart disease of native coronary artery without angina pectoris: Secondary | ICD-10-CM | POA: Diagnosis not present

## 2019-12-08 DIAGNOSIS — R531 Weakness: Secondary | ICD-10-CM | POA: Diagnosis not present

## 2019-12-08 DIAGNOSIS — C3432 Malignant neoplasm of lower lobe, left bronchus or lung: Secondary | ICD-10-CM | POA: Diagnosis not present

## 2019-12-13 DIAGNOSIS — I2510 Atherosclerotic heart disease of native coronary artery without angina pectoris: Secondary | ICD-10-CM | POA: Diagnosis not present

## 2019-12-13 DIAGNOSIS — C3432 Malignant neoplasm of lower lobe, left bronchus or lung: Secondary | ICD-10-CM | POA: Diagnosis not present

## 2019-12-13 DIAGNOSIS — F329 Major depressive disorder, single episode, unspecified: Secondary | ICD-10-CM | POA: Diagnosis not present

## 2019-12-13 DIAGNOSIS — G4700 Insomnia, unspecified: Secondary | ICD-10-CM | POA: Diagnosis not present

## 2019-12-13 DIAGNOSIS — Z87891 Personal history of nicotine dependence: Secondary | ICD-10-CM | POA: Diagnosis not present

## 2019-12-13 DIAGNOSIS — K219 Gastro-esophageal reflux disease without esophagitis: Secondary | ICD-10-CM | POA: Diagnosis not present

## 2019-12-21 DIAGNOSIS — F329 Major depressive disorder, single episode, unspecified: Secondary | ICD-10-CM | POA: Diagnosis not present

## 2019-12-21 DIAGNOSIS — K219 Gastro-esophageal reflux disease without esophagitis: Secondary | ICD-10-CM | POA: Diagnosis not present

## 2019-12-21 DIAGNOSIS — I2510 Atherosclerotic heart disease of native coronary artery without angina pectoris: Secondary | ICD-10-CM | POA: Diagnosis not present

## 2019-12-21 DIAGNOSIS — C3432 Malignant neoplasm of lower lobe, left bronchus or lung: Secondary | ICD-10-CM | POA: Diagnosis not present

## 2019-12-21 DIAGNOSIS — Z87891 Personal history of nicotine dependence: Secondary | ICD-10-CM | POA: Diagnosis not present

## 2019-12-21 DIAGNOSIS — G4700 Insomnia, unspecified: Secondary | ICD-10-CM | POA: Diagnosis not present

## 2019-12-22 DIAGNOSIS — G4700 Insomnia, unspecified: Secondary | ICD-10-CM | POA: Diagnosis not present

## 2019-12-22 DIAGNOSIS — C3432 Malignant neoplasm of lower lobe, left bronchus or lung: Secondary | ICD-10-CM | POA: Diagnosis not present

## 2019-12-22 DIAGNOSIS — K219 Gastro-esophageal reflux disease without esophagitis: Secondary | ICD-10-CM | POA: Diagnosis not present

## 2019-12-22 DIAGNOSIS — I2510 Atherosclerotic heart disease of native coronary artery without angina pectoris: Secondary | ICD-10-CM | POA: Diagnosis not present

## 2019-12-22 DIAGNOSIS — F329 Major depressive disorder, single episode, unspecified: Secondary | ICD-10-CM | POA: Diagnosis not present

## 2019-12-22 DIAGNOSIS — Z87891 Personal history of nicotine dependence: Secondary | ICD-10-CM | POA: Diagnosis not present

## 2019-12-24 ENCOUNTER — Other Ambulatory Visit: Payer: Self-pay | Admitting: Internal Medicine

## 2019-12-24 DIAGNOSIS — T827XXD Infection and inflammatory reaction due to other cardiac and vascular devices, implants and grafts, subsequent encounter: Secondary | ICD-10-CM

## 2019-12-24 DIAGNOSIS — K219 Gastro-esophageal reflux disease without esophagitis: Secondary | ICD-10-CM | POA: Diagnosis not present

## 2019-12-24 DIAGNOSIS — G4700 Insomnia, unspecified: Secondary | ICD-10-CM | POA: Diagnosis not present

## 2019-12-24 DIAGNOSIS — Z87891 Personal history of nicotine dependence: Secondary | ICD-10-CM | POA: Diagnosis not present

## 2019-12-24 DIAGNOSIS — I2510 Atherosclerotic heart disease of native coronary artery without angina pectoris: Secondary | ICD-10-CM | POA: Diagnosis not present

## 2019-12-24 DIAGNOSIS — C3432 Malignant neoplasm of lower lobe, left bronchus or lung: Secondary | ICD-10-CM | POA: Diagnosis not present

## 2019-12-24 DIAGNOSIS — F329 Major depressive disorder, single episode, unspecified: Secondary | ICD-10-CM | POA: Diagnosis not present

## 2019-12-25 DIAGNOSIS — C3432 Malignant neoplasm of lower lobe, left bronchus or lung: Secondary | ICD-10-CM | POA: Diagnosis not present

## 2019-12-25 DIAGNOSIS — I2510 Atherosclerotic heart disease of native coronary artery without angina pectoris: Secondary | ICD-10-CM | POA: Diagnosis not present

## 2019-12-25 DIAGNOSIS — Z87891 Personal history of nicotine dependence: Secondary | ICD-10-CM | POA: Diagnosis not present

## 2019-12-25 DIAGNOSIS — G4700 Insomnia, unspecified: Secondary | ICD-10-CM | POA: Diagnosis not present

## 2019-12-25 DIAGNOSIS — F329 Major depressive disorder, single episode, unspecified: Secondary | ICD-10-CM | POA: Diagnosis not present

## 2019-12-25 DIAGNOSIS — K219 Gastro-esophageal reflux disease without esophagitis: Secondary | ICD-10-CM | POA: Diagnosis not present

## 2019-12-26 DIAGNOSIS — G4700 Insomnia, unspecified: Secondary | ICD-10-CM | POA: Diagnosis not present

## 2019-12-26 DIAGNOSIS — K219 Gastro-esophageal reflux disease without esophagitis: Secondary | ICD-10-CM | POA: Diagnosis not present

## 2019-12-26 DIAGNOSIS — C3432 Malignant neoplasm of lower lobe, left bronchus or lung: Secondary | ICD-10-CM | POA: Diagnosis not present

## 2019-12-26 DIAGNOSIS — Z87891 Personal history of nicotine dependence: Secondary | ICD-10-CM | POA: Diagnosis not present

## 2019-12-26 DIAGNOSIS — I2510 Atherosclerotic heart disease of native coronary artery without angina pectoris: Secondary | ICD-10-CM | POA: Diagnosis not present

## 2019-12-26 DIAGNOSIS — F329 Major depressive disorder, single episode, unspecified: Secondary | ICD-10-CM | POA: Diagnosis not present

## 2019-12-28 DIAGNOSIS — F329 Major depressive disorder, single episode, unspecified: Secondary | ICD-10-CM | POA: Diagnosis not present

## 2019-12-28 DIAGNOSIS — I2510 Atherosclerotic heart disease of native coronary artery without angina pectoris: Secondary | ICD-10-CM | POA: Diagnosis not present

## 2019-12-28 DIAGNOSIS — K219 Gastro-esophageal reflux disease without esophagitis: Secondary | ICD-10-CM | POA: Diagnosis not present

## 2019-12-28 DIAGNOSIS — G4700 Insomnia, unspecified: Secondary | ICD-10-CM | POA: Diagnosis not present

## 2019-12-28 DIAGNOSIS — C3432 Malignant neoplasm of lower lobe, left bronchus or lung: Secondary | ICD-10-CM | POA: Diagnosis not present

## 2019-12-28 DIAGNOSIS — Z87891 Personal history of nicotine dependence: Secondary | ICD-10-CM | POA: Diagnosis not present

## 2019-12-30 DIAGNOSIS — G4700 Insomnia, unspecified: Secondary | ICD-10-CM | POA: Diagnosis not present

## 2019-12-30 DIAGNOSIS — Z87891 Personal history of nicotine dependence: Secondary | ICD-10-CM | POA: Diagnosis not present

## 2019-12-30 DIAGNOSIS — C3432 Malignant neoplasm of lower lobe, left bronchus or lung: Secondary | ICD-10-CM | POA: Diagnosis not present

## 2019-12-30 DIAGNOSIS — F329 Major depressive disorder, single episode, unspecified: Secondary | ICD-10-CM | POA: Diagnosis not present

## 2019-12-30 DIAGNOSIS — I2510 Atherosclerotic heart disease of native coronary artery without angina pectoris: Secondary | ICD-10-CM | POA: Diagnosis not present

## 2019-12-30 DIAGNOSIS — K219 Gastro-esophageal reflux disease without esophagitis: Secondary | ICD-10-CM | POA: Diagnosis not present

## 2019-12-31 DIAGNOSIS — G4700 Insomnia, unspecified: Secondary | ICD-10-CM | POA: Diagnosis not present

## 2019-12-31 DIAGNOSIS — C3432 Malignant neoplasm of lower lobe, left bronchus or lung: Secondary | ICD-10-CM | POA: Diagnosis not present

## 2019-12-31 DIAGNOSIS — F329 Major depressive disorder, single episode, unspecified: Secondary | ICD-10-CM | POA: Diagnosis not present

## 2019-12-31 DIAGNOSIS — I2510 Atherosclerotic heart disease of native coronary artery without angina pectoris: Secondary | ICD-10-CM | POA: Diagnosis not present

## 2019-12-31 DIAGNOSIS — Z87891 Personal history of nicotine dependence: Secondary | ICD-10-CM | POA: Diagnosis not present

## 2019-12-31 DIAGNOSIS — K219 Gastro-esophageal reflux disease without esophagitis: Secondary | ICD-10-CM | POA: Diagnosis not present

## 2020-01-01 DIAGNOSIS — C3432 Malignant neoplasm of lower lobe, left bronchus or lung: Secondary | ICD-10-CM | POA: Diagnosis not present

## 2020-01-01 DIAGNOSIS — G4700 Insomnia, unspecified: Secondary | ICD-10-CM | POA: Diagnosis not present

## 2020-01-01 DIAGNOSIS — Z87891 Personal history of nicotine dependence: Secondary | ICD-10-CM | POA: Diagnosis not present

## 2020-01-01 DIAGNOSIS — F329 Major depressive disorder, single episode, unspecified: Secondary | ICD-10-CM | POA: Diagnosis not present

## 2020-01-01 DIAGNOSIS — I2510 Atherosclerotic heart disease of native coronary artery without angina pectoris: Secondary | ICD-10-CM | POA: Diagnosis not present

## 2020-01-01 DIAGNOSIS — K219 Gastro-esophageal reflux disease without esophagitis: Secondary | ICD-10-CM | POA: Diagnosis not present

## 2020-01-07 DEATH — deceased

## 2020-01-25 ENCOUNTER — Other Ambulatory Visit: Payer: Self-pay

## 2020-01-25 MED ORDER — ATORVASTATIN CALCIUM 80 MG PO TABS: ORAL_TABLET | ORAL | 1 refills | Status: AC

## 2020-02-10 ENCOUNTER — Telehealth: Payer: Self-pay

## 2020-02-10 NOTE — Telephone Encounter (Signed)
Received call from patient's daughter stating patient passed about a month ago and was still receiving appointment reminders. Marked patient's chart as "deceased"   Carlean Purl, RN

## 2020-02-15 ENCOUNTER — Ambulatory Visit: Payer: Self-pay | Admitting: Internal Medicine

## 2020-02-20 ENCOUNTER — Ambulatory Visit: Payer: Medicare Other | Admitting: Internal Medicine

## 2020-02-22 ENCOUNTER — Other Ambulatory Visit: Payer: Self-pay | Admitting: Sports Medicine

## 2020-02-22 DIAGNOSIS — F32A Depression, unspecified: Secondary | ICD-10-CM
